# Patient Record
Sex: Male | Born: 1935 | Race: White | Hispanic: No | Marital: Married | Smoking: Former smoker
Health system: Southern US, Community
[De-identification: ages and names within clinical notes are randomized; demographics above are authoritative.]

## PROBLEM LIST (undated history)

## (undated) DIAGNOSIS — T17908A Unspecified foreign body in respiratory tract, part unspecified causing other injury, initial encounter: Secondary | ICD-10-CM

## (undated) DIAGNOSIS — F329 Major depressive disorder, single episode, unspecified: Secondary | ICD-10-CM

## (undated) DIAGNOSIS — R0902 Hypoxemia: Secondary | ICD-10-CM

## (undated) DIAGNOSIS — E7800 Pure hypercholesterolemia, unspecified: Secondary | ICD-10-CM

## (undated) DIAGNOSIS — R269 Unspecified abnormalities of gait and mobility: Secondary | ICD-10-CM

## (undated) DIAGNOSIS — R413 Other amnesia: Secondary | ICD-10-CM

## (undated) DIAGNOSIS — J449 Chronic obstructive pulmonary disease, unspecified: Secondary | ICD-10-CM

## (undated) DIAGNOSIS — J45909 Unspecified asthma, uncomplicated: Secondary | ICD-10-CM

## (undated) DIAGNOSIS — E538 Deficiency of other specified B group vitamins: Secondary | ICD-10-CM

## (undated) DIAGNOSIS — M810 Age-related osteoporosis without current pathological fracture: Secondary | ICD-10-CM

## (undated) DIAGNOSIS — H4901 Third [oculomotor] nerve palsy, right eye: Secondary | ICD-10-CM

## (undated) DIAGNOSIS — I219 Acute myocardial infarction, unspecified: Secondary | ICD-10-CM

## (undated) DIAGNOSIS — K449 Diaphragmatic hernia without obstruction or gangrene: Secondary | ICD-10-CM

## (undated) DIAGNOSIS — H469 Unspecified optic neuritis: Secondary | ICD-10-CM

## (undated) DIAGNOSIS — N200 Calculus of kidney: Secondary | ICD-10-CM

## (undated) DIAGNOSIS — K219 Gastro-esophageal reflux disease without esophagitis: Secondary | ICD-10-CM

## (undated) DIAGNOSIS — I639 Cerebral infarction, unspecified: Secondary | ICD-10-CM

## (undated) DIAGNOSIS — H3530 Unspecified macular degeneration: Secondary | ICD-10-CM

## (undated) DIAGNOSIS — IMO0002 Reserved for concepts with insufficient information to code with codable children: Secondary | ICD-10-CM

## (undated) DIAGNOSIS — G603 Idiopathic progressive neuropathy: Secondary | ICD-10-CM

## (undated) DIAGNOSIS — T18128A Food in esophagus causing other injury, initial encounter: Secondary | ICD-10-CM

## (undated) DIAGNOSIS — H269 Unspecified cataract: Secondary | ICD-10-CM

## (undated) DIAGNOSIS — I10 Essential (primary) hypertension: Secondary | ICD-10-CM

## (undated) DIAGNOSIS — F32A Depression, unspecified: Secondary | ICD-10-CM

## (undated) DIAGNOSIS — K5790 Diverticulosis of intestine, part unspecified, without perforation or abscess without bleeding: Secondary | ICD-10-CM

## (undated) HISTORY — DX: Unspecified abnormalities of gait and mobility: R26.9

## (undated) HISTORY — DX: Hypoxemia: R09.02

## (undated) HISTORY — DX: Food in esophagus causing other injury, initial encounter: T18.128A

## (undated) HISTORY — PX: OTHER SURGICAL HISTORY: SHX169

## (undated) HISTORY — DX: Pure hypercholesterolemia, unspecified: E78.00

## (undated) HISTORY — PX: LITHOTRIPSY: SUR834

## (undated) HISTORY — DX: Depression, unspecified: F32.A

## (undated) HISTORY — DX: Cerebral infarction, unspecified: I63.9

## (undated) HISTORY — DX: Unspecified macular degeneration: H35.30

## (undated) HISTORY — DX: Third (oculomotor) nerve palsy, right eye: H49.01

## (undated) HISTORY — DX: Major depressive disorder, single episode, unspecified: F32.9

## (undated) HISTORY — DX: Deficiency of other specified B group vitamins: E53.8

## (undated) HISTORY — DX: Idiopathic progressive neuropathy: G60.3

## (undated) HISTORY — DX: Age-related osteoporosis without current pathological fracture: M81.0

## (undated) HISTORY — DX: Unspecified asthma, uncomplicated: J45.909

## (undated) HISTORY — DX: Unspecified foreign body in respiratory tract, part unspecified causing other injury, initial encounter: T17.908A

## (undated) HISTORY — DX: Diaphragmatic hernia without obstruction or gangrene: K44.9

## (undated) HISTORY — DX: Unspecified optic neuritis: H46.9

## (undated) HISTORY — DX: Other amnesia: R41.3

---

## 1962-02-17 HISTORY — PX: SALIVARY STONE REMOVAL: SHX5213

## 1997-02-17 HISTORY — PX: ROTATOR CUFF REPAIR: SHX139

## 2000-02-18 DIAGNOSIS — I219 Acute myocardial infarction, unspecified: Secondary | ICD-10-CM

## 2000-02-18 HISTORY — PX: CARDIAC CATHETERIZATION: SHX172

## 2000-02-18 HISTORY — DX: Acute myocardial infarction, unspecified: I21.9

## 2006-02-17 HISTORY — PX: HIP FRACTURE SURGERY: SHX118

## 2006-02-17 HISTORY — PX: TOTAL HIP ARTHROPLASTY: SHX124

## 2010-02-17 HISTORY — PX: OTHER SURGICAL HISTORY: SHX169

## 2012-03-11 ENCOUNTER — Other Ambulatory Visit: Payer: Self-pay | Admitting: Neurosurgery

## 2012-03-11 ENCOUNTER — Encounter (HOSPITAL_COMMUNITY): Payer: Self-pay | Admitting: Pharmacy Technician

## 2012-03-12 ENCOUNTER — Encounter (HOSPITAL_COMMUNITY): Payer: Self-pay | Admitting: *Deleted

## 2012-03-14 MED ORDER — CEFAZOLIN SODIUM 1-5 GM-% IV SOLN
1.0000 g | INTRAVENOUS | Status: DC
  Filled 2012-03-14: qty 50

## 2012-03-15 ENCOUNTER — Ambulatory Visit (HOSPITAL_COMMUNITY): Payer: Medicare Other

## 2012-03-15 ENCOUNTER — Encounter (HOSPITAL_COMMUNITY): Payer: Self-pay | Admitting: Certified Registered"

## 2012-03-15 ENCOUNTER — Encounter (HOSPITAL_COMMUNITY)
Admission: RE | Disposition: A | Discharge status: Home / Self Care | Payer: Self-pay | Source: Ambulatory Visit | Attending: Neurosurgery

## 2012-03-15 ENCOUNTER — Encounter (HOSPITAL_COMMUNITY): Payer: Self-pay | Admitting: *Deleted

## 2012-03-15 ENCOUNTER — Ambulatory Visit (HOSPITAL_COMMUNITY)
Admission: RE | Admit: 2012-03-15 | Discharge: 2012-03-16 | Disposition: A | Discharge status: Home / Self Care | Payer: Medicare Other | Source: Ambulatory Visit | Attending: Neurosurgery | Admitting: Neurosurgery

## 2012-03-15 ENCOUNTER — Ambulatory Visit (HOSPITAL_COMMUNITY): Payer: Medicare Other | Admitting: Certified Registered"

## 2012-03-15 DIAGNOSIS — I2510 Atherosclerotic heart disease of native coronary artery without angina pectoris: Secondary | ICD-10-CM | POA: Insufficient documentation

## 2012-03-15 DIAGNOSIS — J4489 Other specified chronic obstructive pulmonary disease: Secondary | ICD-10-CM | POA: Insufficient documentation

## 2012-03-15 DIAGNOSIS — I252 Old myocardial infarction: Secondary | ICD-10-CM | POA: Insufficient documentation

## 2012-03-15 DIAGNOSIS — I10 Essential (primary) hypertension: Secondary | ICD-10-CM | POA: Insufficient documentation

## 2012-03-15 DIAGNOSIS — J449 Chronic obstructive pulmonary disease, unspecified: Secondary | ICD-10-CM | POA: Insufficient documentation

## 2012-03-15 DIAGNOSIS — M5126 Other intervertebral disc displacement, lumbar region: Secondary | ICD-10-CM | POA: Insufficient documentation

## 2012-03-15 DIAGNOSIS — K219 Gastro-esophageal reflux disease without esophagitis: Secondary | ICD-10-CM | POA: Insufficient documentation

## 2012-03-15 HISTORY — PX: LUMBAR LAMINECTOMY/DECOMPRESSION MICRODISCECTOMY: SHX5026

## 2012-03-15 HISTORY — DX: Calculus of kidney: N20.0

## 2012-03-15 HISTORY — DX: Essential (primary) hypertension: I10

## 2012-03-15 HISTORY — DX: Acute myocardial infarction, unspecified: I21.9

## 2012-03-15 HISTORY — DX: Gastro-esophageal reflux disease without esophagitis: K21.9

## 2012-03-15 HISTORY — DX: Reserved for concepts with insufficient information to code with codable children: IMO0002

## 2012-03-15 HISTORY — DX: Diverticulosis of intestine, part unspecified, without perforation or abscess without bleeding: K57.90

## 2012-03-15 HISTORY — DX: Chronic obstructive pulmonary disease, unspecified: J44.9

## 2012-03-15 LAB — BASIC METABOLIC PANEL
BUN: 27 mg/dL — ABNORMAL HIGH (ref 6–23)
Chloride: 99 mEq/L (ref 96–112)
Creatinine, Ser: 0.97 mg/dL (ref 0.50–1.35)
GFR calc Af Amer: >90 mL/min (ref >90)
GFR calc non Af Amer: 79 mL/min — ABNORMAL LOW (ref >90)
Glucose, Bld: 100 mg/dL — ABNORMAL HIGH (ref 70–99)
Potassium: 5 mEq/L (ref 3.5–5.1)

## 2012-03-15 LAB — CBC
HCT: 43.7 % (ref 39.0–52.0)
Hemoglobin: 15.3 g/dL (ref 13.0–17.0)
MCH: 30 pg (ref 26.0–34.0)
MCHC: 35 g/dL (ref 30.0–36.0)
RDW: 13.1 % (ref 11.5–15.5)

## 2012-03-15 LAB — SURGICAL PCR SCREEN: MRSA, PCR: NEGATIVE

## 2012-03-15 SURGERY — LUMBAR LAMINECTOMY/DECOMPRESSION MICRODISCECTOMY 1 LEVEL
Anesthesia: General | Site: Back | Laterality: Left | Wound class: Clean

## 2012-03-15 MED ORDER — PROPOFOL 10 MG/ML IV BOLUS
INTRAVENOUS | Status: DC | PRN
  Administered 2012-03-15: 150 mg via INTRAVENOUS

## 2012-03-15 MED ORDER — SODIUM CHLORIDE 0.9 % IJ SOLN
3.0000 mL | Freq: Two times a day (BID) | INTRAMUSCULAR | Status: DC
  Administered 2012-03-15: 3 mL via INTRAVENOUS

## 2012-03-15 MED ORDER — HYDROMORPHONE HCL PF 1 MG/ML IJ SOLN: 0.2500 mg | INTRAMUSCULAR | Status: DC | PRN

## 2012-03-15 MED ORDER — HYDROMORPHONE HCL PF 1 MG/ML IJ SOLN
0.5000 mg | INTRAMUSCULAR | Status: DC | PRN
Start: 2012-03-15 — End: 2012-03-16

## 2012-03-15 MED ORDER — PHENOL 1.4 % MT LIQD: 1.0000 | OROMUCOSAL | Status: DC | PRN

## 2012-03-15 MED ORDER — BUPIVACAINE HCL (PF) 0.25 % IJ SOLN
INTRAMUSCULAR | Status: DC | PRN
  Administered 2012-03-15: 10 mL

## 2012-03-15 MED ORDER — HYDROCODONE-ACETAMINOPHEN 5-325 MG PO TABS: 1.0000 | ORAL_TABLET | ORAL | Status: DC | PRN

## 2012-03-15 MED ORDER — BACITRACIN 50000 UNITS IM SOLR
INTRAMUSCULAR | Status: AC
  Filled 2012-03-15: qty 1

## 2012-03-15 MED ORDER — GUAIFENESIN ER 600 MG PO TB12
600.0000 mg | ORAL_TABLET | Freq: Two times a day (BID) | ORAL | Status: DC
  Administered 2012-03-15: 600 mg via ORAL
  Filled 2012-03-15 (×3): qty 1

## 2012-03-15 MED ORDER — CEFAZOLIN SODIUM-DEXTROSE 2-3 GM-% IV SOLR
INTRAVENOUS | Status: AC
  Filled 2012-03-15: qty 50

## 2012-03-15 MED ORDER — OXYCODONE HCL 5 MG PO TABS: 5.0000 mg | ORAL_TABLET | ORAL | Status: DC | PRN

## 2012-03-15 MED ORDER — ROCURONIUM BROMIDE 100 MG/10ML IV SOLN
INTRAVENOUS | Status: DC | PRN
  Administered 2012-03-15: 50 mg via INTRAVENOUS

## 2012-03-15 MED ORDER — SODIUM CHLORIDE 0.9 % IV SOLN
INTRAVENOUS | Status: AC
  Filled 2012-03-15: qty 500

## 2012-03-15 MED ORDER — ONDANSETRON HCL 4 MG/2ML IJ SOLN
INTRAMUSCULAR | Status: DC | PRN
  Administered 2012-03-15: 4 mg via INTRAVENOUS

## 2012-03-15 MED ORDER — LIDOCAINE-EPINEPHRINE 1 %-1:100000 IJ SOLN
INTRAMUSCULAR | Status: DC | PRN
  Administered 2012-03-15: 9 mL

## 2012-03-15 MED ORDER — CEFAZOLIN SODIUM-DEXTROSE 2-3 GM-% IV SOLR
2.0000 g | INTRAVENOUS | Status: AC
  Administered 2012-03-15: 2 g via INTRAVENOUS

## 2012-03-15 MED ORDER — ALENDRONATE SODIUM 70 MG PO TABS: 70.0000 mg | ORAL_TABLET | ORAL | Status: DC

## 2012-03-15 MED ORDER — ACETAMINOPHEN 325 MG PO TABS: 650.0000 mg | ORAL_TABLET | ORAL | Status: DC | PRN

## 2012-03-15 MED ORDER — HEMOSTATIC AGENTS (NO CHARGE) OPTIME
TOPICAL | Status: DC | PRN
  Administered 2012-03-15: 1 via TOPICAL

## 2012-03-15 MED ORDER — NIACIN ER (ANTIHYPERLIPIDEMIC) 500 MG PO TBCR
500.0000 mg | EXTENDED_RELEASE_TABLET | Freq: Every day | ORAL | Status: DC
  Administered 2012-03-15: 500 mg via ORAL
  Filled 2012-03-15 (×2): qty 1

## 2012-03-15 MED ORDER — DOCUSATE SODIUM 100 MG PO CAPS
100.0000 mg | ORAL_CAPSULE | Freq: Two times a day (BID) | ORAL | Status: DC
  Administered 2012-03-15 (×2): 100 mg via ORAL
  Filled 2012-03-15 (×2): qty 1

## 2012-03-15 MED ORDER — DEXAMETHASONE SODIUM PHOSPHATE 10 MG/ML IJ SOLN: 10.0000 mg | INTRAMUSCULAR | Status: DC

## 2012-03-15 MED ORDER — DEXAMETHASONE SODIUM PHOSPHATE 10 MG/ML IJ SOLN
INTRAMUSCULAR | Status: AC
  Administered 2012-03-15: 10 mg via INTRAVENOUS
  Filled 2012-03-15: qty 1

## 2012-03-15 MED ORDER — EZETIMIBE-SIMVASTATIN 10-40 MG PO TABS
1.0000 | ORAL_TABLET | Freq: Every day | ORAL | Status: DC
  Administered 2012-03-15: 1 via ORAL
  Filled 2012-03-15 (×2): qty 1

## 2012-03-15 MED ORDER — MUPIROCIN 2 % EX OINT
TOPICAL_OINTMENT | CUTANEOUS | Status: AC
  Filled 2012-03-15: qty 22

## 2012-03-15 MED ORDER — PANTOPRAZOLE SODIUM 40 MG PO TBEC: 40.0000 mg | DELAYED_RELEASE_TABLET | Freq: Every day | ORAL | Status: DC

## 2012-03-15 MED ORDER — LACTATED RINGERS IV SOLN
INTRAVENOUS | Status: DC | PRN
  Administered 2012-03-15 (×2): via INTRAVENOUS

## 2012-03-15 MED ORDER — CEFAZOLIN SODIUM 1-5 GM-% IV SOLN
1.0000 g | Freq: Three times a day (TID) | INTRAVENOUS | Status: AC
Start: 2012-03-15 — End: 2012-03-16
  Administered 2012-03-15 – 2012-03-16 (×2): 1 g via INTRAVENOUS
  Filled 2012-03-15 (×2): qty 50

## 2012-03-15 MED ORDER — LIDOCAINE HCL (CARDIAC) 20 MG/ML IV SOLN
INTRAVENOUS | Status: DC | PRN
  Administered 2012-03-15: 30 mg via INTRAVENOUS
  Administered 2012-03-15: 70 mg via INTRAVENOUS

## 2012-03-15 MED ORDER — OXYCODONE-ACETAMINOPHEN 10-325 MG PO TABS: 1.0000 | ORAL_TABLET | ORAL | Status: DC | PRN

## 2012-03-15 MED ORDER — 0.9 % SODIUM CHLORIDE (POUR BTL) OPTIME
TOPICAL | Status: DC | PRN
  Administered 2012-03-15: 1000 mL

## 2012-03-15 MED ORDER — OXYCODONE HCL 5 MG/5ML PO SOLN: 5.0000 mg | Freq: Once | ORAL | Status: DC | PRN

## 2012-03-15 MED ORDER — ONDANSETRON HCL 4 MG/2ML IJ SOLN: 4.0000 mg | INTRAMUSCULAR | Status: DC | PRN

## 2012-03-15 MED ORDER — LIDOCAINE HCL (CARDIAC) 20 MG/ML IV SOLN: INTRAVENOUS | Status: DC | PRN

## 2012-03-15 MED ORDER — THROMBIN 5000 UNITS EX KIT
PACK | CUTANEOUS | Status: DC | PRN
  Administered 2012-03-15 (×2): 5000 [IU] via TOPICAL

## 2012-03-15 MED ORDER — ACETAMINOPHEN 10 MG/ML IV SOLN
INTRAVENOUS | Status: AC
  Administered 2012-03-15: 1000 mg via INTRAVENOUS
  Filled 2012-03-15: qty 100

## 2012-03-15 MED ORDER — SODIUM CHLORIDE 0.9 % IV SOLN: 250.0000 mL | INTRAVENOUS | Status: DC

## 2012-03-15 MED ORDER — ARTIFICIAL TEARS OP OINT
TOPICAL_OINTMENT | OPHTHALMIC | Status: DC | PRN
  Administered 2012-03-15: 1 via OPHTHALMIC

## 2012-03-15 MED ORDER — OXYCODONE-ACETAMINOPHEN 5-325 MG PO TABS
1.0000 | ORAL_TABLET | ORAL | Status: DC | PRN
  Administered 2012-03-15: 1 via ORAL
  Administered 2012-03-16: 2 via ORAL
  Filled 2012-03-15: qty 1
  Filled 2012-03-15: qty 2

## 2012-03-15 MED ORDER — METOPROLOL TARTRATE 25 MG PO TABS
25.0000 mg | ORAL_TABLET | Freq: Two times a day (BID) | ORAL | Status: DC
  Filled 2012-03-15 (×3): qty 1

## 2012-03-15 MED ORDER — FENTANYL CITRATE 0.05 MG/ML IJ SOLN
INTRAMUSCULAR | Status: DC | PRN
  Administered 2012-03-15: 100 ug via INTRAVENOUS

## 2012-03-15 MED ORDER — CYCLOBENZAPRINE HCL 10 MG PO TABS
10.0000 mg | ORAL_TABLET | Freq: Three times a day (TID) | ORAL | Status: DC | PRN
Start: 2012-03-15 — End: 2012-03-16
  Administered 2012-03-15: 10 mg via ORAL
  Filled 2012-03-15: qty 1

## 2012-03-15 MED ORDER — MUPIROCIN 2 % EX OINT
TOPICAL_OINTMENT | Freq: Two times a day (BID) | CUTANEOUS | Status: DC
  Administered 2012-03-15: 22:00:00 via NASAL
  Filled 2012-03-15: qty 22

## 2012-03-15 MED ORDER — SODIUM CHLORIDE 0.9 % IJ SOLN: 3.0000 mL | INTRAMUSCULAR | Status: DC | PRN

## 2012-03-15 MED ORDER — VITAMIN D (ERGOCALCIFEROL) 1.25 MG (50000 UNIT) PO CAPS: 50000.0000 [IU] | ORAL_CAPSULE | ORAL | Status: DC

## 2012-03-15 MED ORDER — MENTHOL 3 MG MT LOZG: 1.0000 | LOZENGE | OROMUCOSAL | Status: DC | PRN

## 2012-03-15 MED ORDER — GLYCOPYRROLATE 0.2 MG/ML IJ SOLN
INTRAMUSCULAR | Status: DC | PRN
  Administered 2012-03-15: .6 mg via INTRAVENOUS

## 2012-03-15 MED ORDER — SODIUM CHLORIDE 0.9 % IR SOLN
Status: DC | PRN
  Administered 2012-03-15: 11:00:00

## 2012-03-15 MED ORDER — OXYCODONE-ACETAMINOPHEN 5-325 MG PO TABS: 1.0000 | ORAL_TABLET | ORAL | Status: DC | PRN

## 2012-03-15 MED ORDER — NEOSTIGMINE METHYLSULFATE 1 MG/ML IJ SOLN
INTRAMUSCULAR | Status: DC | PRN
  Administered 2012-03-15: 4 mg via INTRAVENOUS

## 2012-03-15 MED ORDER — PROPOFOL 10 MG/ML IV BOLUS: INTRAVENOUS | Status: DC | PRN

## 2012-03-15 MED ORDER — ONDANSETRON HCL 4 MG/2ML IJ SOLN: 4.0000 mg | Freq: Four times a day (QID) | INTRAMUSCULAR | Status: DC | PRN

## 2012-03-15 MED ORDER — ACETAMINOPHEN 650 MG RE SUPP: 650.0000 mg | RECTAL | Status: DC | PRN

## 2012-03-15 MED ORDER — RAMIPRIL 2.5 MG PO CAPS
2.5000 mg | ORAL_CAPSULE | Freq: Every day | ORAL | Status: DC
  Administered 2012-03-15: 2.5 mg via ORAL
  Filled 2012-03-15 (×2): qty 1

## 2012-03-15 MED ORDER — OXYCODONE HCL 5 MG PO TABS: 5.0000 mg | ORAL_TABLET | Freq: Once | ORAL | Status: DC | PRN

## 2012-03-15 SURGICAL SUPPLY — 56 items
BAG DECANTER FOR FLEXI CONT (MISCELLANEOUS) ×2 IMPLANT
BENZOIN TINCTURE PRP APPL 2/3 (GAUZE/BANDAGES/DRESSINGS) ×2 IMPLANT
BLADE SURG 11 STRL SS (BLADE) ×2 IMPLANT
BLADE SURG ROTATE 9660 (MISCELLANEOUS) IMPLANT
BRUSH SCRUB EZ PLAIN DRY (MISCELLANEOUS) ×2 IMPLANT
BUR MATCHSTICK NEURO 3.0 LAGG (BURR) ×2 IMPLANT
BUR PRECISION FLUTE 6.0 (BURR) ×2 IMPLANT
CANISTER SUCTION 2500CC (MISCELLANEOUS) ×2 IMPLANT
CLOTH BEACON ORANGE TIMEOUT ST (SAFETY) ×2 IMPLANT
CONT SPEC 4OZ CLIKSEAL STRL BL (MISCELLANEOUS) ×2 IMPLANT
DECANTER SPIKE VIAL GLASS SM (MISCELLANEOUS) ×2 IMPLANT
DERMABOND ADVANCED (GAUZE/BANDAGES/DRESSINGS) ×1
DERMABOND ADVANCED .7 DNX12 (GAUZE/BANDAGES/DRESSINGS) ×1 IMPLANT
DRAPE LAPAROTOMY 100X72X124 (DRAPES) ×2 IMPLANT
DRAPE MICROSCOPE LEICA (MISCELLANEOUS) ×2 IMPLANT
DRAPE POUCH INSTRU U-SHP 10X18 (DRAPES) ×2 IMPLANT
DRAPE PROXIMA HALF (DRAPES) IMPLANT
DRAPE SURG 17X23 STRL (DRAPES) ×2 IMPLANT
DRSG OPSITE 4X5.5 SM (GAUZE/BANDAGES/DRESSINGS) ×2 IMPLANT
DURAPREP 26ML APPLICATOR (WOUND CARE) ×2 IMPLANT
ELECT REM PT RETURN 9FT ADLT (ELECTROSURGICAL) ×2
ELECTRODE REM PT RTRN 9FT ADLT (ELECTROSURGICAL) ×1 IMPLANT
GAUZE SPONGE 4X4 16PLY XRAY LF (GAUZE/BANDAGES/DRESSINGS) IMPLANT
GLOVE BIO SURGEON STRL SZ8 (GLOVE) ×2 IMPLANT
GLOVE BIOGEL PI IND STRL 7.5 (GLOVE) ×1 IMPLANT
GLOVE BIOGEL PI IND STRL 8.5 (GLOVE) ×2 IMPLANT
GLOVE BIOGEL PI INDICATOR 7.5 (GLOVE) ×1
GLOVE BIOGEL PI INDICATOR 8.5 (GLOVE) ×2
GLOVE ECLIPSE 7.5 STRL STRAW (GLOVE) ×4 IMPLANT
GLOVE EXAM NITRILE LRG STRL (GLOVE) IMPLANT
GLOVE EXAM NITRILE MD LF STRL (GLOVE) ×2 IMPLANT
GLOVE EXAM NITRILE XL STR (GLOVE) IMPLANT
GLOVE EXAM NITRILE XS STR PU (GLOVE) IMPLANT
GLOVE INDICATOR 8.5 STRL (GLOVE) ×2 IMPLANT
GLOVE SURG SS PI 8.0 STRL IVOR (GLOVE) ×6 IMPLANT
GOWN BRE IMP SLV AUR LG STRL (GOWN DISPOSABLE) IMPLANT
GOWN BRE IMP SLV AUR XL STRL (GOWN DISPOSABLE) ×4 IMPLANT
GOWN STRL REIN 2XL LVL4 (GOWN DISPOSABLE) ×4 IMPLANT
KIT BASIN OR (CUSTOM PROCEDURE TRAY) ×2 IMPLANT
KIT ROOM TURNOVER OR (KITS) ×2 IMPLANT
NEEDLE HYPO 22GX1.5 SAFETY (NEEDLE) ×2 IMPLANT
NEEDLE SPNL 22GX3.5 QUINCKE BK (NEEDLE) ×2 IMPLANT
NS IRRIG 1000ML POUR BTL (IV SOLUTION) ×2 IMPLANT
PACK LAMINECTOMY NEURO (CUSTOM PROCEDURE TRAY) ×2 IMPLANT
RUBBERBAND STERILE (MISCELLANEOUS) ×4 IMPLANT
SPONGE GAUZE 4X4 12PLY (GAUZE/BANDAGES/DRESSINGS) ×2 IMPLANT
SPONGE SURGIFOAM ABS GEL SZ50 (HEMOSTASIS) ×2 IMPLANT
STRIP CLOSURE SKIN 1/2X4 (GAUZE/BANDAGES/DRESSINGS) ×2 IMPLANT
SUT VIC AB 0 CT1 18XCR BRD8 (SUTURE) ×1 IMPLANT
SUT VIC AB 0 CT1 8-18 (SUTURE) ×1
SUT VIC AB 2-0 CT1 18 (SUTURE) ×2 IMPLANT
SUT VICRYL 4-0 PS2 18IN ABS (SUTURE) ×2 IMPLANT
SYR 20ML ECCENTRIC (SYRINGE) ×2 IMPLANT
TOWEL OR 17X24 6PK STRL BLUE (TOWEL DISPOSABLE) ×2 IMPLANT
TOWEL OR 17X26 10 PK STRL BLUE (TOWEL DISPOSABLE) ×2 IMPLANT
WATER STERILE IRR 1000ML POUR (IV SOLUTION) ×2 IMPLANT

## 2012-03-15 NOTE — Anesthesia Procedure Notes (Signed)
Procedure Name: Intubation Date/Time: 03/15/2012 10:53 AM Performed by: Fransisca Kaufmann Pre-anesthesia Checklist: Patient identified, Suction available, Patient being monitored and Timeout performed Patient Re-evaluated:Patient Re-evaluated prior to inductionOxygen Delivery Method: Circle system utilized Preoxygenation: Pre-oxygenation with 100% oxygen Intubation Type: IV induction Ventilation: Mask ventilation without difficulty Laryngoscope Size: Miller and 3 Grade View: Grade I Tube type: Oral Tube size: 8.0 mm Number of attempts: 1 Airway Equipment and Method: Stylet Placement Confirmation: ETT inserted through vocal cords under direct vision,  positive ETCO2 and breath sounds checked- equal and bilateral Secured at: 23 cm Tube secured with: Tape Dental Injury: Teeth and Oropharynx as per pre-operative assessment

## 2012-03-15 NOTE — Op Note (Signed)
Preoperative diagnosis: Bilateral L3-L4 radiculopathy from large ruptured disc L3-4  Postoperative diagnosis: Same  Procedure: lumbar laminectomy  and microscopic discectomy L3-4 with microdissection of the left L4 nerve root  Surgeon: Donalee Citrin  Assistant: Sherilyn Cooter pool  Anesthesia: Gen.  EBL: Minimal  History of present illness: Patient positive for presents with worsening bilateral hip and leg pain rating down to his anterior quad as well as lateral thighs and from the standard numbness tingling syncope she difficulty walking. Workup revealed a very large disc herniation causing severe stenosis at L3-4 displacing both L4 nerve roots with the majority of the disc being eccentric to the left. The patient's progression of clinical syndrome and imaging findings and failure conservative treatment I recommended a laminectomy microdiscectomy recommend initial approach from the left I went over the risks and benefits of the operation with him as well as perioperative course expectations about alternatives of surgery he understood and agreed to proceed forward.  Operative procedure: Patient was brought into the or was induced under general anesthesia positioned prone the Wilson frame his back was prepped and draped in routine sterile fashion. Preoperative localizing the L4 pedicle so incision was centered just slightly superior this after infiltration of 9 cc lidocaine with epi and Bovie light car was used to gas in tissues subperiosteal dissections care lamina of L. L3 and L4 and interoperative x-ray confirmed this to the case. Then using a high-speed drill the interest of the L3 medial facet complexes suppressant of 4 drill down laminotomy was begun with a 3 minute Kerrison punch ligament flavum was identified and removed in piecemeal fashion exposing the thecal sac and the microscope illumination under biting of the medial facet complex a ladder axis the lateral margins disc space the L4 pedicle was  identified and the L4 nerve was identified at the level of the pedicle. Then working up underneath the L4 nerve root this is dissected off of very large free fragment of disc which was partially incised with lumbar scalpel and several large fragments of disc were medially removed with I nerve hook and pituitary rongeurs. The spaces cleanout radically several more fragments removed from just superior disc space underneath the interest of the L3 nerve root then at the discectomy there is no further stenosis coronary.and hockey-stick now is easily accepted out each foramen as well as across the midline cephalad caudally. Was in to proceed irrigated meticulous hemostasis was maintained Gelfoam was overlaid top of the dura and the muscle fascia reapproximated layers with after Vicryl and the skin was closed with a running 4 subcuticular benzo and Steri-Strips were applied patient recovered in stable condition. At the end of case on it counts and sponge counts were correct.

## 2012-03-15 NOTE — Transfer of Care (Signed)
Immediate Anesthesia Transfer of Care Note  Patient: Chris Braun  Procedure(s) Performed: Procedure(s) (LRB) with comments: LUMBAR LAMINECTOMY/DECOMPRESSION MICRODISCECTOMY 1 LEVEL (Left) - Left lumbar three-four decompressive lumbar laminectomy, discectomy  Patient Location: PACU  Anesthesia Type:General  Level of Consciousness: awake, alert , oriented and sedated  Airway & Oxygen Therapy: Patient Spontanous Breathing and Patient connected to nasal cannula oxygen  Post-op Assessment: Report given to PACU RN, Post -op Vital signs reviewed and stable and Patient moving all extremities  Post vital signs: Reviewed and stable  Complications: No apparent anesthesia complications

## 2012-03-15 NOTE — H&P (Signed)
Chris Braun is an 77 y.o. male.   Chief Complaint: Back and bilateral hip and leg pain HPI: Patient is a 64 of them is a long-standing history with his back he is an old compression fracture is in managing with chronic pain for a long time however last couple weeks have progressive worsening back and pain in both legs C7 aggressive decline his bili ambulate I initially to a cane now with a walker pain he says reports into his hips and 9 to the front of his quads as well as the outside of his thighs and understands is been refractory to all forms of conservative treatment he underwent followup MRI scan showed a new disc herniation and due to his failure conservative treatment progression of clinical syndrome and imaging findings we recommended laminectomy and discectomy.  Past Medical History  Diagnosis Date  . Myocardial infarction 2002  . Compression fracture     BAck  . Kidney stones   . Diverticulosis     per colonscopy  . Hypertension   . COPD (chronic obstructive pulmonary disease)   . GERD (gastroesophageal reflux disease)     Past Surgical History  Procedure Date  . Vt study     with Ablation  . Hip fracture surgery 2008    right  . Rotator cuff repair     bil   . Lithotripsy   . Colonscopy   . Cardiac catheterization 2002,    stents     History reviewed. No pertinent family history. Social History:  does not have a smoking history on file. He does not have any smokeless tobacco history on file. His alcohol and drug histories not on file.  Allergies: No Known Allergies  Medications Prior to Admission  Medication Sig Dispense Refill  . alendronate (FOSAMAX) 70 MG tablet Take 70 mg by mouth every 7 (seven) days. Take with a full glass of water on an empty stomach. Takes on Friday.      . cyclobenzaprine (FLEXERIL) 10 MG tablet Take 10 mg by mouth 3 (three) times daily as needed.      . ezetimibe-simvastatin (VYTORIN) 10-40 MG per tablet Take 1 tablet by mouth  at bedtime.      . metoprolol tartrate (LOPRESSOR) 25 MG tablet Take 25 mg by mouth 2 (two) times daily.      . niacin (NIASPAN) 500 MG CR tablet Take 500 mg by mouth at bedtime.      Marland Kitchen omeprazole (PRILOSEC) 20 MG capsule Take 20 mg by mouth daily.      Marland Kitchen oxyCODONE-acetaminophen (PERCOCET) 10-325 MG per tablet Take 1 tablet by mouth every 4 (four) hours as needed.      . ramipril (ALTACE) 2.5 MG capsule Take 2.5 mg by mouth daily.      . Vitamin D, Ergocalciferol, (DRISDOL) 50000 UNITS CAPS Take 50,000 Units by mouth every 7 (seven) days. Takes on Friday        Results for orders placed during the hospital encounter of 03/15/12 (from the past 48 hour(s))  BASIC METABOLIC PANEL     Status: Abnormal   Collection Time   03/15/12  9:21 AM      Component Value Range Comment   Sodium 136  135 - 145 mEq/L    Potassium 5.0  3.5 - 5.1 mEq/L    Chloride 99  96 - 112 mEq/L    CO2 27  19 - 32 mEq/L    Glucose, Bld 100 (*) 70 - 99 mg/dL  BUN 27 (*) 6 - 23 mg/dL    Creatinine, Ser 1.61  0.50 - 1.35 mg/dL    Calcium 9.4  8.4 - 09.6 mg/dL    GFR calc non Af Amer 79 (*) >90 mL/min    GFR calc Af Amer >90  >90 mL/min   CBC     Status: Abnormal   Collection Time   03/15/12  9:21 AM      Component Value Range Comment   WBC 11.1 (*) 4.0 - 10.5 K/uL    RBC 5.10  4.22 - 5.81 MIL/uL    Hemoglobin 15.3  13.0 - 17.0 g/dL    HCT 04.5  40.9 - 81.1 %    MCV 85.7  78.0 - 100.0 fL    MCH 30.0  26.0 - 34.0 pg    MCHC 35.0  30.0 - 36.0 g/dL    RDW 91.4  78.2 - 95.6 %    Platelets 191  150 - 400 K/uL    Dg Chest 2 View  03/15/2012  *RADIOLOGY REPORT*  Clinical Data: Preoperative respiratory examination for lumbar spine surgery.  Cough and congestion.  CHEST - 2 VIEW  Comparison: Radiographs and CT 08/11/2006.  Findings: The heart size and mediastinal contours are stable. There is stable mild elevation of the left hemidiaphragm.  Overall basilar aeration has improved with resolution of previously demonstrated  bibasilar atelectasis.  Diffuse emphysematous changes are again noted.  There is no pleural effusion or pneumothorax.  IMPRESSION: Resolved bibasilar atelectasis with stable findings of chronic obstructive pulmonary disease.  No acute cardiopulmonary process.   Original Report Authenticated By: Carey Bullocks, M.D.     Review of Systems  Constitutional: Negative.   HENT: Negative.   Eyes: Negative.   Respiratory: Negative.   Cardiovascular: Negative.   Gastrointestinal: Negative.   Genitourinary: Negative.   Musculoskeletal: Positive for myalgias, back pain and joint pain.  Neurological: Positive for tingling and sensory change.  Endo/Heme/Allergies: Negative.   Psychiatric/Behavioral: Negative.     Blood pressure 122/76, pulse 51, temperature 97.8 F (36.6 C), temperature source Oral, resp. rate 16, height 5\' 8"  (1.727 m), weight 77.111 kg (170 lb), SpO2 97.00%. Physical Exam  Constitutional: He is oriented to person, place, and time. He appears well-developed.  HENT:  Head: Normocephalic.  Neck: Normal range of motion.  Cardiovascular: Normal rate.   Respiratory: Effort normal.  GI: Soft.  Musculoskeletal: Normal range of motion.  Neurological: He is alert and oriented to person, place, and time. He has normal strength. GCS eye subscore is 4. GCS verbal subscore is 5. GCS motor subscore is 6.  Reflex Scores:      Patellar reflexes are 0 on the right side and 0 on the left side.      Achilles reflexes are 0 on the right side and 0 on the left side.      Strength is 5 out of 5 in his iliopsoas, quads, hamstrings, gastrocs, anterior tibialis, and EHL.     Assessment/Plan 77 year old gentleman presents for L3-4 lumbar lengthening discectomy I have recommended a left-sided approach as a disc herniation shavers the left side however is a large herniation that is causing stenosis centrally and bilateral symptoms I explained the patient is possible and expose the right side and tied the  lamina laminotomy on that side as well he understands all this brings is reviewed the risks and benefits of the operation with him as well as perioperative course expectations about alternatives of surgery he wants to  proceed forward.  Lorice Lafave P 03/15/2012, 10:38 AM

## 2012-03-15 NOTE — Progress Notes (Signed)
PHARMACIST - PHYSICIAN COMMUNICATION  CONCERNING: P&T Medication Policy Regarding Oral Bisphosphonates  RECOMMENDATION: Your order for alendronate (Fosamax), ibandronate (Boniva), or risedronate (Actonel) has been discontinued at this time.  If the patient's post-hospital medical condition warrants safe use of this class of drugs, please resume the pre-hospital regimen upon discharge.  DESCRIPTION:  Alendronate (Fosamax), ibandronate (Boniva), and risedronate (Actonel) can cause severe esophageal erosions in patients who are unable to remain upright at least 30 minutes after taking this medication.   Since brief interruptions in therapy are thought to have minimal impact on bone mineral density, the Pharmacy & Therapeutics Committee has established that bisphosphonate orders should be routinely discontinued during hospitalization.   To override this safety policy and permit administration of Boniva, Fosamax, or Actonel in the hospital, prescribers must write "DO NOT HOLD" in the comments section when placing the order for this class of medications.   Noah Delaine, RPh Clinical Pharmacist Pager: [phone] 03/15/2012 2:12 PM

## 2012-03-15 NOTE — Progress Notes (Addendum)
0840   HAVE REQUESTED AGAIN (ORIGINAL SENT Friday 24th AT 8:06PM) CARDIAC NOTES (STRESS, EKG, ECHO, LAST OFFICE NOTE) TO BE FAXED TO SHORT STAY A.Marland KitchenMarland KitchenCARDIO OFFICE WAS UNAWARE THAT Chris Braun WAS HAVING BACK SURGERY, SO I DOUBT WE WILL GET A CARDIAC CLEARANCE NOTE...DA  (HIS CARDIO MD IS DR. Bing Matter...)

## 2012-03-15 NOTE — Anesthesia Preprocedure Evaluation (Signed)
Anesthesia Evaluation  Patient identified by MRN, date of birth, ID band Patient awake    Reviewed: Allergy & Precautions, H&P , NPO status , Patient's Chart, lab work & pertinent test results  Airway Mallampati: II  Neck ROM: full    Dental   Pulmonary COPD         Cardiovascular hypertension, + CAD and + Past MI     Neuro/Psych    GI/Hepatic GERD-  ,  Endo/Other    Renal/GU      Musculoskeletal   Abdominal   Peds  Hematology   Anesthesia Other Findings   Reproductive/Obstetrics                           Anesthesia Physical Anesthesia Plan  ASA: III  Anesthesia Plan: General   Post-op Pain Management:    Induction: Intravenous  Airway Management Planned: Oral ETT  Additional Equipment:   Intra-op Plan:   Post-operative Plan: Extubation in OR  Informed Consent: I have reviewed the patients History and Physical, chart, labs and discussed the procedure including the risks, benefits and alternatives for the proposed anesthesia with the patient or authorized representative who has indicated his/her understanding and acceptance.     Plan Discussed with: CRNA and Surgeon  Anesthesia Plan Comments:         Anesthesia Quick Evaluation

## 2012-03-15 NOTE — Preoperative (Signed)
Beta Blockers   Reason not to administer Beta Blockers:Not Applicable 

## 2012-03-15 NOTE — Anesthesia Postprocedure Evaluation (Signed)
Anesthesia Post Note  Patient: Chris Braun  Procedure(s) Performed: Procedure(s) (LRB): LUMBAR LAMINECTOMY/DECOMPRESSION MICRODISCECTOMY 1 LEVEL (Left)  Anesthesia type: General  Patient location: PACU  Post pain: Pain level controlled and Adequate analgesia  Post assessment: Post-op Vital signs reviewed, Patient's Cardiovascular Status Stable, Respiratory Function Stable, Patent Airway and Pain level controlled  Last Vitals:  Filed Vitals:   03/15/12 1350  BP: 141/89  Pulse: 65  Temp: 36.6 C  Resp: 18    Post vital signs: Reviewed and stable  Level of consciousness: awake, alert  and oriented  Complications: No apparent anesthesia complications

## 2012-03-16 ENCOUNTER — Encounter (HOSPITAL_COMMUNITY): Payer: Self-pay | Admitting: Neurosurgery

## 2012-03-16 MED ORDER — OXYCODONE-ACETAMINOPHEN 5-325 MG PO TABS: 1.0000 | ORAL_TABLET | ORAL | Status: DC | PRN

## 2012-03-16 MED ORDER — CYCLOBENZAPRINE HCL 10 MG PO TABS: 10.0000 mg | ORAL_TABLET | Freq: Three times a day (TID) | ORAL | Status: DC | PRN

## 2012-03-16 NOTE — Progress Notes (Signed)
Pt and wife given D/C instructions with Rx's, both verbalized understanding. Pt D/C'd home via wheelchair @ 0750 per MD order. Rema Fendt, RN

## 2012-03-16 NOTE — Discharge Summary (Signed)
  Physician Discharge Summary  Patient ID: Chris Braun MRN: [id number] DOB/AGE: December 17, 1935 77 y.o.  Admit date: 03/15/2012 Discharge date: 03/16/2012  Admission Diagnoses: HNP L3-4  Discharge Diagnoses: Same Active Problems:  * No active hospital problems. *    Discharged Condition: good  Hospital Course: Patient hospital underwent laminectomy discectomy L3-4 and left postoperative patient did very well recovered in the floor on the floor patient was convalescing well and living and voiding spontaneously tolerating regular diet was stable and be discharged home on postop day 1.  Consults: Significant Diagnostic Studies: Treatments: L3-4 left-sided laminectomy discectomy Discharge Exam: Blood pressure 123/67, pulse 72, temperature 97.8 F (36.6 C), temperature source Oral, resp. rate 18, height 5\' 8"  (1.727 m), weight 77.111 kg (170 lb), SpO2 94.00%. Strength out of 5 wound clean and dry  Disposition: Home     Medication List     As of 03/16/2012  7:25 AM    TAKE these medications         alendronate 70 MG tablet   Commonly known as: FOSAMAX   Take 70 mg by mouth every 7 (seven) days. Take with a full glass of water on an empty stomach.  Takes on Friday.      cyclobenzaprine 10 MG tablet   Commonly known as: FLEXERIL   Take 1 tablet (10 mg total) by mouth 3 (three) times daily as needed for muscle spasms.      cyclobenzaprine 10 MG tablet   Commonly known as: FLEXERIL   Take 10 mg by mouth 3 (three) times daily as needed.      ezetimibe-simvastatin 10-40 MG per tablet   Commonly known as: VYTORIN   Take 1 tablet by mouth at bedtime.      metoprolol tartrate 25 MG tablet   Commonly known as: LOPRESSOR   Take 25 mg by mouth 2 (two) times daily.      niacin 500 MG CR tablet   Commonly known as: NIASPAN   Take 500 mg by mouth at bedtime.      omeprazole 20 MG capsule   Commonly known as: PRILOSEC   Take 20 mg by mouth daily.     oxyCODONE-acetaminophen 5-325 MG per tablet   Commonly known as: PERCOCET/ROXICET   Take 1-2 tablets by mouth every 4 (four) hours as needed.      oxyCODONE-acetaminophen 10-325 MG per tablet   Commonly known as: PERCOCET   Take 1 tablet by mouth every 4 (four) hours as needed.      ramipril 2.5 MG capsule   Commonly known as: ALTACE   Take 2.5 mg by mouth daily.      Vitamin D (Ergocalciferol) 50000 UNITS Caps   Commonly known as: DRISDOL   Take 50,000 Units by mouth every 7 (seven) days. Takes on Friday         Signed: Maleigh Braun P 03/16/2012, 7:25 AM

## 2012-03-16 NOTE — Progress Notes (Signed)
Subjective: Patient reports The his legs feeling much better  Objective: Vital signs in last 24 hours: Temp:  [97.5 F (36.4 C)-98.4 F (36.9 C)] 97.8 F (36.6 C) (01/28 0346) Pulse Rate:  [51-76] 72  (01/28 0346) Resp:  [12-18] 18  (01/28 0346) BP: (93-141)/(62-97) 123/67 mmHg (01/28 0346) SpO2:  [93 %-100 %] 94 % (01/28 0346)  Intake/Output from previous day: 01/27 0701 - 01/28 0700 In: 1530 [P.O.:480; I.V.:1050] Out: 50 [Blood:50] Intake/Output this shift:    Strength out of 5 wound clean and dry  Lab Results:  [facility] 03/15/12 0921  WBC 11.1*  HGB 15.3  HCT 43.7  PLT 191   BMET  Basename 03/15/12 0921  NA 136  K 5.0  CL 99  CO2 27  GLUCOSE 100*  BUN 27*  CREATININE 0.97  CALCIUM 9.4    Studies/Results: Dg Chest 2 View  03/15/2012  *RADIOLOGY REPORT*  Clinical Data: Preoperative respiratory examination for lumbar spine surgery.  Cough and congestion.  CHEST - 2 VIEW  Comparison: Radiographs and CT 08/11/2006.  Findings: The heart size and mediastinal contours are stable. There is stable mild elevation of the left hemidiaphragm.  Overall basilar aeration has improved with resolution of previously demonstrated bibasilar atelectasis.  Diffuse emphysematous changes are again noted.  There is no pleural effusion or pneumothorax.  IMPRESSION: Resolved bibasilar atelectasis with stable findings of chronic obstructive pulmonary disease.  No acute cardiopulmonary process.   Original Report Authenticated By: Carey Bullocks, M.D.    Dg Lumbar Spine 2-3 Views  03/15/2012  *RADIOLOGY REPORT*  Clinical Data: L3 - L4 lumbar laminectomy  LUMBAR SPINE - 2-3 VIEW  Comparison: Lumbar spine MRI - 03/09/2012  Findings:  Lumbar spine labeling is in keeping with preprocedural lumbar spine MRI.  Two spot lateral projection intraoperative radiographs of the lumbar spine are provided for review.  Image labeled #1 demonstrates a radiopaque marking needle overlying the soft tissues posterior  to the L4 spinous process.  Imaged labeled #2 demonstrates the tip of a radiopaque surgical instrument overlying the soft tissues posterior to the caudal aspect of the L3 - L4 intervertebral disc space.  Additional radiopaque surgical instruments are seen about the operative site.  IMPRESSION: Intraoperative spinal localization as above.   Original Report Authenticated By: Tacey Ruiz, MD     Assessment/Plan: Discharge home  LOS: 1 day     Jamil Armwood P 03/16/2012, 7:23 AM

## 2012-11-25 ENCOUNTER — Other Ambulatory Visit: Payer: Self-pay | Admitting: Neurosurgery

## 2012-11-29 ENCOUNTER — Encounter (HOSPITAL_COMMUNITY): Payer: Self-pay | Admitting: Pharmacy Technician

## 2012-12-02 ENCOUNTER — Inpatient Hospital Stay (HOSPITAL_COMMUNITY): Admission: RE | Admit: 2012-12-02 | Payer: Medicare Other | Source: Ambulatory Visit

## 2012-12-02 ENCOUNTER — Other Ambulatory Visit (HOSPITAL_COMMUNITY): Payer: Medicare Other

## 2012-12-03 ENCOUNTER — Encounter (HOSPITAL_COMMUNITY): Payer: Self-pay

## 2012-12-03 ENCOUNTER — Encounter (HOSPITAL_COMMUNITY)
Admission: RE | Admit: 2012-12-03 | Discharge: 2012-12-03 | Disposition: A | Discharge status: Home / Self Care | Payer: Medicare Other | Source: Ambulatory Visit | Attending: Neurosurgery | Admitting: Neurosurgery

## 2012-12-03 DIAGNOSIS — Z01818 Encounter for other preprocedural examination: Secondary | ICD-10-CM | POA: Insufficient documentation

## 2012-12-03 DIAGNOSIS — Z01812 Encounter for preprocedural laboratory examination: Secondary | ICD-10-CM | POA: Insufficient documentation

## 2012-12-03 HISTORY — DX: Unspecified cataract: H26.9

## 2012-12-03 LAB — COMPREHENSIVE METABOLIC PANEL
AST: 18 U/L (ref 0–37)
Albumin: 3.5 g/dL (ref 3.5–5.2)
BUN: 16 mg/dL (ref 6–23)
Calcium: 9.3 mg/dL (ref 8.4–10.5)
Creatinine, Ser: 0.98 mg/dL (ref 0.50–1.35)
GFR calc Af Amer: >90 mL/min (ref >90)
GFR calc non Af Amer: 79 mL/min — ABNORMAL LOW (ref >90)
Sodium: 143 mEq/L (ref 135–145)
Total Bilirubin: 0.9 mg/dL (ref 0.3–1.2)
Total Protein: 6.7 g/dL (ref 6.0–8.3)

## 2012-12-03 LAB — CBC
HCT: 41.5 % (ref 39.0–52.0)
MCH: 30.5 pg (ref 26.0–34.0)
MCHC: 34.2 g/dL (ref 30.0–36.0)
MCV: 89.1 fL (ref 78.0–100.0)
Platelets: 145 10*3/uL — ABNORMAL LOW (ref 150–400)
RDW: 13.5 % (ref 11.5–15.5)
WBC: 7.6 10*3/uL (ref 4.0–10.5)

## 2012-12-03 LAB — SURGICAL PCR SCREEN: MRSA, PCR: NEGATIVE

## 2012-12-03 LAB — TYPE AND SCREEN
ABO/RH(D): O POS
Antibody Screen: NEGATIVE

## 2012-12-03 NOTE — Progress Notes (Deleted)
EKG already in chart.  Please ignore request to call Dr. Vanetta Shawl office on Monday.  DA

## 2012-12-03 NOTE — Pre-Procedure Instructions (Addendum)
AERON DONAGHEY  12/03/2012   Your procedure is scheduled on:  Wednesday, October 22nd   Report to [facility]  2 * 3 at 6:30 AM.             -(FREE Valet Parking)  Call this number if you have problems the morning of surgery: [phone]   Remember:   Do not eat food or drink liquids after midnight Tuesday.   Take these medicines the morning of surgery with A SIP OF WATER: Metoprolol, Omeprazole, Oxycodone, Albuterol inhaler.   Do not wear jewelry.  Do not wear lotions, powders, or colognes. You may wear deodorant.   Men may shave face and neck.   Do not bring valuables to the hospital.  [facility] is not responsible for any belongings or valuables.               Contacts, dentures or bridgework may not be worn into surgery.  Leave suitcase in the car. After surgery it may be brought to your room.  For patients admitted to the hospital, discharge time is determined by your treatment team.    Name and phone number of your driver:    Special Instructions: Shower using CHG 2 nights before surgery and the night before surgery.  If you shower the day of surgery use CHG.  Use special wash - you have one bottle of CHG for all showers.  You should use approximately 1/3 of the bottle for each shower.   Please read over the following fact sheets that you were given: Pain Booklet, Coughing and Deep Breathing, Blood Transfusion Information, MRSA Information and Surgical Site Infection Prevention

## 2012-12-03 NOTE — Progress Notes (Signed)
12/03/12 1424  OBSTRUCTIVE SLEEP APNEA  Have you ever been diagnosed with sleep apnea through a sleep study? No  Do you snore loudly (loud enough to be heard through closed doors)?  1  Do you often feel tired, fatigued, or sleepy during the daytime? 1 (gets up early every morning)  Has anyone observed you stop breathing during your sleep? 0  Do you have, or are you being treated for high blood pressure? 1  BMI more than 35 kg/m2? 0  Age over 75 years old? 1  Neck circumference greater than 40 cm/18 inches? 0  Gender: 1  Obstructive Sleep Apnea Score 5  Score 4 or greater  Results sent to PCP

## 2012-12-03 NOTE — Progress Notes (Addendum)
Echo, EKG, Stress test & LOV received from [facility] and inside chart.    DA

## 2012-12-06 NOTE — Progress Notes (Signed)
Anesthesia Chart Review:  Patient is a 77 year old male scheduled for L3-4, L4-5 PLIF on 12/08/12 by Dr. Wynetta Emery.  History includes L3-4 discectomy 03/15/12, former smoker, CAD/MI '02, GERD, HTN, COPD, diverticulosis, nephrolithiasis, cardiac dysrhythmia (VT) s/p accessory pathway ablation.  Cardiologist was reported as Dr. Bing Matter with [facility].  He was seen by his partner Dr. Tomie China on 11/04/12 for preoperative cardiology evaluation.  He felt that if patient's Lexiscan sestamibi for negative, he would not be a high risk for coronary events perioperatively (see below).  Nuclear stress test on 11/08/12 showed diaphragmatic attenuation and partial volume artifact in inferior wall on rest and stress, but no areas of vasodilator stress induced hypoperfusion. Normal left ventricular size and function with LVEF of 57%.  Echo on 08/26/12 showed normal LV systolic function, EF 59%. Abnormal diastolic function. Mild mitral regurgitation.  EKG on 11/04/12 showed NSR.  Preoperative CXR and labs noted.  If no acute changes, then I anticipate that he can proceed as planned.  Velna Ochs [facility] Short Stay Center/Anesthesiology Phone [phone] 12/06/2012 11:27 AM

## 2012-12-07 MED ORDER — CEFAZOLIN SODIUM-DEXTROSE 2-3 GM-% IV SOLR
2.0000 g | INTRAVENOUS | Status: AC
  Administered 2012-12-08: 2 g via INTRAVENOUS
  Filled 2012-12-07 (×2): qty 50

## 2012-12-08 ENCOUNTER — Encounter (HOSPITAL_COMMUNITY)
Admission: RE | Disposition: A | Discharge status: Skilled Nursing Facility | Payer: Medicare Other | Source: Ambulatory Visit | Attending: Neurosurgery

## 2012-12-08 ENCOUNTER — Inpatient Hospital Stay (HOSPITAL_COMMUNITY): Payer: Medicare Other

## 2012-12-08 ENCOUNTER — Inpatient Hospital Stay (HOSPITAL_COMMUNITY)
Admission: RE | Admit: 2012-12-08 | Discharge: 2012-12-11 | DRG: 460 | Disposition: A | Discharge status: Skilled Nursing Facility | Payer: Medicare Other | Source: Ambulatory Visit | Attending: Neurosurgery | Admitting: Neurosurgery

## 2012-12-08 ENCOUNTER — Encounter (HOSPITAL_COMMUNITY): Payer: Medicare Other | Admitting: Vascular Surgery

## 2012-12-08 ENCOUNTER — Inpatient Hospital Stay (HOSPITAL_COMMUNITY): Payer: Medicare Other | Admitting: Certified Registered"

## 2012-12-08 ENCOUNTER — Encounter (HOSPITAL_COMMUNITY): Payer: Self-pay | Admitting: *Deleted

## 2012-12-08 DIAGNOSIS — I10 Essential (primary) hypertension: Secondary | ICD-10-CM | POA: Diagnosis present

## 2012-12-08 DIAGNOSIS — M47817 Spondylosis without myelopathy or radiculopathy, lumbosacral region: Principal | ICD-10-CM | POA: Diagnosis present

## 2012-12-08 DIAGNOSIS — J4489 Other specified chronic obstructive pulmonary disease: Secondary | ICD-10-CM | POA: Diagnosis present

## 2012-12-08 DIAGNOSIS — J449 Chronic obstructive pulmonary disease, unspecified: Secondary | ICD-10-CM | POA: Diagnosis present

## 2012-12-08 DIAGNOSIS — K219 Gastro-esophageal reflux disease without esophagitis: Secondary | ICD-10-CM | POA: Diagnosis present

## 2012-12-08 DIAGNOSIS — M7130 Other bursal cyst, unspecified site: Secondary | ICD-10-CM | POA: Diagnosis present

## 2012-12-08 DIAGNOSIS — Z87891 Personal history of nicotine dependence: Secondary | ICD-10-CM

## 2012-12-08 DIAGNOSIS — I252 Old myocardial infarction: Secondary | ICD-10-CM

## 2012-12-08 DIAGNOSIS — M545 Low back pain: Secondary | ICD-10-CM

## 2012-12-08 DIAGNOSIS — M5126 Other intervertebral disc displacement, lumbar region: Secondary | ICD-10-CM | POA: Diagnosis present

## 2012-12-08 HISTORY — PX: MAXIMUM ACCESS (MAS)POSTERIOR LUMBAR INTERBODY FUSION (PLIF) 2 LEVEL: SHX6369

## 2012-12-08 SURGERY — FOR MAXIMUM ACCESS (MAS) POSTERIOR LUMBAR INTERBODY FUSION (PLIF) 2 LEVEL
Anesthesia: General | Site: Back | Wound class: Clean

## 2012-12-08 MED ORDER — BUPIVACAINE HCL (PF) 0.25 % IJ SOLN
INTRAMUSCULAR | Status: DC | PRN
  Administered 2012-12-08: 8 mL

## 2012-12-08 MED ORDER — PHENOL 1.4 % MT LIQD: 1.0000 | OROMUCOSAL | Status: DC | PRN

## 2012-12-08 MED ORDER — MUPIROCIN 2 % EX OINT
TOPICAL_OINTMENT | CUTANEOUS | Status: AC
  Filled 2012-12-08: qty 22

## 2012-12-08 MED ORDER — CYCLOBENZAPRINE HCL 10 MG PO TABS
10.0000 mg | ORAL_TABLET | Freq: Three times a day (TID) | ORAL | Status: DC | PRN
  Administered 2012-12-08 – 2012-12-10 (×2): 10 mg via ORAL
  Filled 2012-12-08 (×2): qty 1

## 2012-12-08 MED ORDER — RAMIPRIL 2.5 MG PO CAPS
2.5000 mg | ORAL_CAPSULE | Freq: Every day | ORAL | Status: DC
  Administered 2012-12-09: 2.5 mg via ORAL
  Filled 2012-12-08 (×3): qty 1

## 2012-12-08 MED ORDER — SODIUM CHLORIDE 0.9 % IJ SOLN: 3.0000 mL | INTRAMUSCULAR | Status: DC | PRN

## 2012-12-08 MED ORDER — DOCUSATE SODIUM 100 MG PO CAPS
100.0000 mg | ORAL_CAPSULE | Freq: Two times a day (BID) | ORAL | Status: DC
  Administered 2012-12-08 – 2012-12-11 (×6): 100 mg via ORAL
  Filled 2012-12-08 (×5): qty 1

## 2012-12-08 MED ORDER — LACTATED RINGERS IV SOLN
INTRAVENOUS | Status: DC | PRN
  Administered 2012-12-08 (×2): via INTRAVENOUS

## 2012-12-08 MED ORDER — MUPIROCIN 2 % EX OINT
TOPICAL_OINTMENT | Freq: Once | CUTANEOUS | Status: AC
  Administered 2012-12-08: 07:00:00 via NASAL

## 2012-12-08 MED ORDER — LIDOCAINE-EPINEPHRINE 1 %-1:100000 IJ SOLN
INTRAMUSCULAR | Status: DC | PRN
  Administered 2012-12-08: 10 mL

## 2012-12-08 MED ORDER — OXYCODONE-ACETAMINOPHEN 10-325 MG PO TABS: 1.0000 | ORAL_TABLET | ORAL | Status: DC | PRN

## 2012-12-08 MED ORDER — SUCCINYLCHOLINE CHLORIDE 20 MG/ML IJ SOLN
INTRAMUSCULAR | Status: DC | PRN
  Administered 2012-12-08: 100 mg via INTRAVENOUS

## 2012-12-08 MED ORDER — IBANDRONATE SODIUM 150 MG PO TABS: 150.0000 mg | ORAL_TABLET | ORAL | Status: DC

## 2012-12-08 MED ORDER — DIPHENHYDRAMINE HCL 50 MG/ML IJ SOLN
INTRAMUSCULAR | Status: DC | PRN
  Administered 2012-12-08: 6.25 mg via INTRAVENOUS

## 2012-12-08 MED ORDER — MIDAZOLAM HCL 5 MG/5ML IJ SOLN
INTRAMUSCULAR | Status: DC | PRN
  Administered 2012-12-08 (×2): 1 mg via INTRAVENOUS

## 2012-12-08 MED ORDER — OXYCODONE HCL 5 MG PO TABS
5.0000 mg | ORAL_TABLET | ORAL | Status: DC | PRN
  Administered 2012-12-08 – 2012-12-09 (×2): 5 mg via ORAL
  Filled 2012-12-08 (×2): qty 1

## 2012-12-08 MED ORDER — ASPIRIN EC 81 MG PO TBEC
81.0000 mg | DELAYED_RELEASE_TABLET | Freq: Every day | ORAL | Status: DC
  Administered 2012-12-09 – 2012-12-11 (×3): 81 mg via ORAL
  Filled 2012-12-08 (×3): qty 1

## 2012-12-08 MED ORDER — ONDANSETRON HCL 4 MG/2ML IJ SOLN
INTRAMUSCULAR | Status: DC | PRN
  Administered 2012-12-08: 4 mg via INTRAVENOUS

## 2012-12-08 MED ORDER — METOPROLOL SUCCINATE ER 25 MG PO TB24
25.0000 mg | ORAL_TABLET | Freq: Two times a day (BID) | ORAL | Status: DC
  Administered 2012-12-08 – 2012-12-11 (×4): 25 mg via ORAL
  Filled 2012-12-08 (×7): qty 1

## 2012-12-08 MED ORDER — LIDOCAINE HCL (CARDIAC) 20 MG/ML IV SOLN
INTRAVENOUS | Status: DC | PRN
  Administered 2012-12-08: 60 mg via INTRAVENOUS

## 2012-12-08 MED ORDER — PHENYLEPHRINE HCL 10 MG/ML IJ SOLN
INTRAMUSCULAR | Status: DC | PRN
  Administered 2012-12-08 (×4): 40 ug via INTRAVENOUS

## 2012-12-08 MED ORDER — ALENDRONATE SODIUM 70 MG PO TABS: 70.0000 mg | ORAL_TABLET | ORAL | Status: DC

## 2012-12-08 MED ORDER — ACETAMINOPHEN 650 MG RE SUPP: 650.0000 mg | RECTAL | Status: DC | PRN

## 2012-12-08 MED ORDER — SODIUM CHLORIDE 0.9 % IJ SOLN
3.0000 mL | Freq: Two times a day (BID) | INTRAMUSCULAR | Status: DC
  Administered 2012-12-09 – 2012-12-11 (×5): 3 mL via INTRAVENOUS

## 2012-12-08 MED ORDER — SODIUM CHLORIDE 0.9 % IV SOLN: 250.0000 mL | INTRAVENOUS | Status: DC

## 2012-12-08 MED ORDER — PROPOFOL INFUSION 10 MG/ML OPTIME
INTRAVENOUS | Status: DC | PRN
  Administered 2012-12-08: 50 ug/kg/min via INTRAVENOUS

## 2012-12-08 MED ORDER — ALUM & MAG HYDROXIDE-SIMETH 200-200-20 MG/5ML PO SUSP: 30.0000 mL | Freq: Four times a day (QID) | ORAL | Status: DC | PRN

## 2012-12-08 MED ORDER — MENTHOL 3 MG MT LOZG: 1.0000 | LOZENGE | OROMUCOSAL | Status: DC | PRN

## 2012-12-08 MED ORDER — PROPOFOL 10 MG/ML IV BOLUS
INTRAVENOUS | Status: DC | PRN
  Administered 2012-12-08: 20 mg via INTRAVENOUS
  Administered 2012-12-08: 150 mg via INTRAVENOUS

## 2012-12-08 MED ORDER — HYDROMORPHONE HCL PF 1 MG/ML IJ SOLN: 0.2500 mg | INTRAMUSCULAR | Status: DC | PRN

## 2012-12-08 MED ORDER — GLYCOPYRROLATE 0.2 MG/ML IJ SOLN
INTRAMUSCULAR | Status: DC | PRN
  Administered 2012-12-08 (×2): 0.1 mg via INTRAVENOUS

## 2012-12-08 MED ORDER — OXYCODONE-ACETAMINOPHEN 5-325 MG PO TABS
1.0000 | ORAL_TABLET | ORAL | Status: DC | PRN
  Administered 2012-12-08 – 2012-12-09 (×2): 1 via ORAL
  Filled 2012-12-08 (×2): qty 1

## 2012-12-08 MED ORDER — CEFAZOLIN SODIUM 1-5 GM-% IV SOLN
1.0000 g | Freq: Three times a day (TID) | INTRAVENOUS | Status: AC
  Administered 2012-12-08 – 2012-12-09 (×2): 1 g via INTRAVENOUS
  Filled 2012-12-08 (×2): qty 50

## 2012-12-08 MED ORDER — EPHEDRINE SULFATE 50 MG/ML IJ SOLN
INTRAMUSCULAR | Status: DC | PRN
  Administered 2012-12-08 (×7): 5 mg via INTRAVENOUS

## 2012-12-08 MED ORDER — THROMBIN 20000 UNITS EX SOLR
CUTANEOUS | Status: DC | PRN
  Administered 2012-12-08: 11:00:00 via TOPICAL

## 2012-12-08 MED ORDER — ACETAMINOPHEN 325 MG PO TABS: 650.0000 mg | ORAL_TABLET | ORAL | Status: DC | PRN

## 2012-12-08 MED ORDER — THROMBIN 5000 UNITS EX SOLR
OROMUCOSAL | Status: DC | PRN
  Administered 2012-12-08: 11:00:00 via TOPICAL

## 2012-12-08 MED ORDER — 0.9 % SODIUM CHLORIDE (POUR BTL) OPTIME
TOPICAL | Status: DC | PRN
  Administered 2012-12-08: 1000 mL

## 2012-12-08 MED ORDER — EZETIMIBE-SIMVASTATIN 10-40 MG PO TABS
1.0000 | ORAL_TABLET | Freq: Every day | ORAL | Status: DC
  Administered 2012-12-08 – 2012-12-09 (×2): 1 via ORAL
  Filled 2012-12-08 (×4): qty 1

## 2012-12-08 MED ORDER — OXYCODONE-ACETAMINOPHEN 5-325 MG PO TABS
1.0000 | ORAL_TABLET | ORAL | Status: DC | PRN
  Administered 2012-12-10: 1 via ORAL
  Filled 2012-12-08: qty 1

## 2012-12-08 MED ORDER — PANTOPRAZOLE SODIUM 40 MG PO TBEC
40.0000 mg | DELAYED_RELEASE_TABLET | Freq: Every day | ORAL | Status: DC
  Administered 2012-12-09 – 2012-12-11 (×3): 40 mg via ORAL
  Filled 2012-12-08 (×3): qty 1

## 2012-12-08 MED ORDER — HYDROMORPHONE HCL PF 1 MG/ML IJ SOLN
0.5000 mg | INTRAMUSCULAR | Status: DC | PRN
  Administered 2012-12-09: 1 mg via INTRAVENOUS
  Filled 2012-12-08: qty 1

## 2012-12-08 MED ORDER — ONDANSETRON HCL 4 MG/2ML IJ SOLN: 4.0000 mg | INTRAMUSCULAR | Status: DC | PRN

## 2012-12-08 MED ORDER — ALBUMIN HUMAN 5 % IV SOLN
INTRAVENOUS | Status: DC | PRN
  Administered 2012-12-08: 10:00:00 via INTRAVENOUS

## 2012-12-08 MED ORDER — PHENYLEPHRINE HCL 10 MG/ML IJ SOLN
10.0000 mg | INTRAVENOUS | Status: DC | PRN
  Administered 2012-12-08: 10 ug/min via INTRAVENOUS

## 2012-12-08 MED ORDER — ALBUTEROL SULFATE HFA 108 (90 BASE) MCG/ACT IN AERS: 2.0000 | INHALATION_SPRAY | Freq: Four times a day (QID) | RESPIRATORY_TRACT | Status: DC | PRN

## 2012-12-08 MED ORDER — SODIUM CHLORIDE 0.9 % IR SOLN
Status: DC | PRN
  Administered 2012-12-08: 11:00:00

## 2012-12-08 MED ORDER — FENTANYL CITRATE 0.05 MG/ML IJ SOLN
INTRAMUSCULAR | Status: DC | PRN
  Administered 2012-12-08: 150 ug via INTRAVENOUS
  Administered 2012-12-08: 100 ug via INTRAVENOUS

## 2012-12-08 MED ORDER — DEXAMETHASONE SODIUM PHOSPHATE 10 MG/ML IJ SOLN
INTRAMUSCULAR | Status: DC | PRN
  Administered 2012-12-08: 8 mg via INTRAVENOUS

## 2012-12-08 MED FILL — Heparin Sodium (Porcine) Inj 1000 Unit/ML: INTRAMUSCULAR | Qty: 30 | Status: AC

## 2012-12-08 MED FILL — Sodium Chloride IV Soln 0.9%: INTRAVENOUS | Qty: 1000 | Status: AC

## 2012-12-08 SURGICAL SUPPLY — 70 items
BAG DECANTER FOR FLEXI CONT (MISCELLANEOUS) ×2 IMPLANT
BENZOIN TINCTURE PRP APPL 2/3 (GAUZE/BANDAGES/DRESSINGS) ×2 IMPLANT
BLADE SURG 11 STRL SS (BLADE) ×2 IMPLANT
BLADE SURG ROTATE 9660 (MISCELLANEOUS) IMPLANT
BUR MATCHSTICK NEURO 3.0 LAGG (BURR) ×2 IMPLANT
CAGE COROENT 13X9X28-4 (Cage) ×4 IMPLANT
CANISTER SUCTION 2500CC (MISCELLANEOUS) ×2 IMPLANT
CLIP NEUROVISION LG (CLIP) ×2 IMPLANT
CONT SPEC 4OZ CLIKSEAL STRL BL (MISCELLANEOUS) ×4 IMPLANT
COVER BACK TABLE 24X17X13 BIG (DRAPES) IMPLANT
COVER TABLE BACK 60X90 (DRAPES) ×2 IMPLANT
DERMABOND ADHESIVE PROPEN (GAUZE/BANDAGES/DRESSINGS) ×1
DERMABOND ADVANCED .7 DNX6 (GAUZE/BANDAGES/DRESSINGS) ×1 IMPLANT
DRAPE C-ARM 42X72 X-RAY (DRAPES) ×2 IMPLANT
DRAPE C-ARMOR (DRAPES) ×2 IMPLANT
DRAPE LAPAROTOMY 100X72X124 (DRAPES) ×2 IMPLANT
DRAPE POUCH INSTRU U-SHP 10X18 (DRAPES) ×2 IMPLANT
DRAPE SURG 17X23 STRL (DRAPES) ×2 IMPLANT
DRESSING TELFA 8X3 (GAUZE/BANDAGES/DRESSINGS) IMPLANT
DRSG OPSITE 4X5.5 SM (GAUZE/BANDAGES/DRESSINGS) ×2 IMPLANT
DRSG OPSITE POSTOP 4X8 (GAUZE/BANDAGES/DRESSINGS) ×2 IMPLANT
DURAPREP 26ML APPLICATOR (WOUND CARE) ×2 IMPLANT
ELECT REM PT RETURN 9FT ADLT (ELECTROSURGICAL) ×2
ELECTRODE REM PT RTRN 9FT ADLT (ELECTROSURGICAL) ×1 IMPLANT
EVACUATOR 1/8 PVC DRAIN (DRAIN) ×2 IMPLANT
GAUZE SPONGE 4X4 16PLY XRAY LF (GAUZE/BANDAGES/DRESSINGS) ×2 IMPLANT
GLOVE BIO SURGEON STRL SZ8 (GLOVE) ×2 IMPLANT
GLOVE BIO SURGEON STRL SZ8.5 (GLOVE) ×2 IMPLANT
GLOVE BIOGEL M 8.0 STRL (GLOVE) ×2 IMPLANT
GLOVE INDICATOR 8.5 STRL (GLOVE) ×2 IMPLANT
GLOVE OPTIFIT SS 8.0 STRL (GLOVE) ×2 IMPLANT
GLOVE SS BIOGEL STRL SZ 8 (GLOVE) ×1 IMPLANT
GLOVE SUPERSENSE BIOGEL SZ 8 (GLOVE) ×1
GOWN BRE IMP SLV AUR LG STRL (GOWN DISPOSABLE) IMPLANT
GOWN BRE IMP SLV AUR XL STRL (GOWN DISPOSABLE) ×4 IMPLANT
GOWN STRL REIN 2XL LVL4 (GOWN DISPOSABLE) ×2 IMPLANT
HEMOSTAT POWDER KIT SURGIFOAM (HEMOSTASIS) ×2 IMPLANT
KIT BASIN OR (CUSTOM PROCEDURE TRAY) ×2 IMPLANT
KIT INFUSE XX SMALL 0.7CC (Orthopedic Implant) ×2 IMPLANT
KIT NEEDLE NVM5 EMG ELECT (KITS) ×1 IMPLANT
KIT NEEDLE NVM5 EMG ELECTRODE (KITS) ×1
KIT ROOM TURNOVER OR (KITS) ×2 IMPLANT
MILL MEDIUM DISP (BLADE) ×2 IMPLANT
NEEDLE HYPO 25X1 1.5 SAFETY (NEEDLE) ×2 IMPLANT
NS IRRIG 1000ML POUR BTL (IV SOLUTION) ×2 IMPLANT
PACK LAMINECTOMY NEURO (CUSTOM PROCEDURE TRAY) ×2 IMPLANT
PAD ARMBOARD 7.5X6 YLW CONV (MISCELLANEOUS) ×6 IMPLANT
PUTTY BONE DBX 5CC MIX (Putty) ×2 IMPLANT
ROD 70MM (Rod) ×1 IMPLANT
ROD PREBENT 80MM LUMBAR (Rod) ×2 IMPLANT
ROD SPNL 70XPREBNT NS MAS (Rod) ×1 IMPLANT
SCREW LOCK (Screw) ×6 IMPLANT
SCREW LOCK FXNS SPNE MAS PL (Screw) ×6 IMPLANT
SCREW SHANK 5.0X30MM (Screw) ×4 IMPLANT
SCREW SHANK 5.0X35 (Screw) ×4 IMPLANT
SCREW TULIP 5.5 (Screw) ×8 IMPLANT
SPONGE GAUZE 4X4 12PLY (GAUZE/BANDAGES/DRESSINGS) ×2 IMPLANT
SPONGE LAP 4X18 X RAY DECT (DISPOSABLE) IMPLANT
SPONGE SURGIFOAM ABS GEL 100 (HEMOSTASIS) ×2 IMPLANT
STRIP CLOSURE SKIN 1/2X4 (GAUZE/BANDAGES/DRESSINGS) ×4 IMPLANT
SUT VIC AB 0 CT1 18XCR BRD8 (SUTURE) ×2 IMPLANT
SUT VIC AB 0 CT1 8-18 (SUTURE) ×2
SUT VIC AB 2-0 CT1 18 (SUTURE) ×4 IMPLANT
SUT VICRYL 4-0 PS2 18IN ABS (SUTURE) ×2 IMPLANT
SYR 20ML ECCENTRIC (SYRINGE) ×2 IMPLANT
TAPE STRIPS DRAPE STRL (GAUZE/BANDAGES/DRESSINGS) ×2 IMPLANT
TOWEL OR 17X24 6PK STRL BLUE (TOWEL DISPOSABLE) ×2 IMPLANT
TOWEL OR 17X26 10 PK STRL BLUE (TOWEL DISPOSABLE) ×2 IMPLANT
TRAY FOLEY CATH 16FRSI W/METER (SET/KITS/TRAYS/PACK) ×2 IMPLANT
WATER STERILE IRR 1000ML POUR (IV SOLUTION) ×2 IMPLANT

## 2012-12-08 NOTE — Progress Notes (Addendum)
PHARMACIST - PHYSICIAN COMMUNICATION  CONCERNING: P&T Medication Policy Regarding Oral Bisphosphonates  RECOMMENDATION: Your order for alendronate (Fosamax) and ibandronate (Boniva) has been discontinued at this time.  If the patient's post-hospital medical condition warrants safe use of this class of drugs, please resume the pre-hospital regimen upon discharge.  DESCRIPTION:  Alendronate (Fosamax) and ibandronate (Boniva) can cause severe esophageal erosions in patients who are unable to remain upright at least 30 minutes after taking this medication.   Since brief interruptions in therapy are thought to have minimal impact on bone mineral density, the Pharmacy & Therapeutics Committee has established that bisphosphonate orders should be routinely discontinued during hospitalization.   To override this safety policy and permit administration of Fosamax in the hospital, prescribers must write "DO NOT HOLD" in the comments section when placing the order for this class of medications.  Dorna Leitz, PharmD, BCPS

## 2012-12-08 NOTE — Progress Notes (Signed)
Patient arrived to room via bed from PACU, alert but appears lethargic and arousal at this time. Safety precautions reviewed with patient. Bed exit activated. Repositioned patient. Will reinforce safety precautions.  Sim Boast, RN, 12/08/12 1500

## 2012-12-08 NOTE — Preoperative (Signed)
Beta Blockers   Reason not to administer Beta Blockers:Not Applicable 

## 2012-12-08 NOTE — Anesthesia Preprocedure Evaluation (Addendum)
Anesthesia Evaluation  Patient identified by MRN, date of birth, ID band Patient awake    Reviewed: Allergy & Precautions, H&P , NPO status , Patient's Chart, lab work & pertinent test results, reviewed documented beta blocker date and time   Airway Mallampati: II TM Distance: >3 FB     Dental  (+) Poor Dentition, Chipped, Missing, Loose and Dental Advisory Given   Pulmonary COPD COPD inhaler, former smoker,  breath sounds clear to auscultation        Cardiovascular hypertension, Pt. on medications and Pt. on home beta blockers + Past MI and + Cardiac Stents Rhythm:Regular Rate:Normal     Neuro/Psych    GI/Hepatic GERD-  ,  Endo/Other    Renal/GU Renal disease     Musculoskeletal   Abdominal (+)  Abdomen: soft. Bowel sounds: normal.  Peds  Hematology   Anesthesia Other Findings MI in 2003 cardiac stent placed in [place], no chest pain or complications since 03.  EF 55%.  Pt states he is followed frequently by cardiology.  ECHO and notes in chart. Carlynn Herald, CRNA  Reproductive/Obstetrics                        Anesthesia Physical Anesthesia Plan  ASA: III  Anesthesia Plan: General   Post-op Pain Management:    Induction: Intravenous  Airway Management Planned: Oral ETT  Additional Equipment:   Intra-op Plan:   Post-operative Plan: Extubation in OR  Informed Consent: I have reviewed the patients History and Physical, chart, labs and discussed the procedure including the risks, benefits and alternatives for the proposed anesthesia with the patient or authorized representative who has indicated his/her understanding and acceptance.   Dental advisory given  Plan Discussed with: CRNA, Anesthesiologist and Surgeon  Anesthesia Plan Comments:         Anesthesia Quick Evaluation

## 2012-12-08 NOTE — H&P (Signed)
Braun Braun an 77 y.o. male.   Chief Complaint: Back and right leg HPI: 77 year old gentleman presents with recurrent back pain having several months out from a laminectomy and discectomy were he did very well back Braun rating down his right leg down the lateral anterior thigh occasionally into the front of her shin consistent with L3 and L4 nerve root pattern workup revealed development of a synovial cyst on the right L3-4 and evidence of instability on dynamic films as well as a large Schmorl's node degeneration and instability and deformity at L4-5 the due to patient's failed conservative treatment imaging findings and progression of clinical syndrome I recommended and interbody fusion L3-4 and L4-5 fusion without decompression I extensively the risks benefits of that with the patient as well as perioperative course and expectations alternatives of surgery he understood and agreed to proceed forward.  Past Medical History  Diagnosis Date  . Myocardial infarction 2002  . Compression fracture     BAck  . Kidney stones   . Diverticulosis     per colonscopy  . GERD (gastroesophageal reflux disease)   . Cataracts, bilateral   . Hypertension   . COPD (chronic obstructive pulmonary disease)     Past Surgical History  Procedure Laterality Date  . Vt study      with Ablation  . Hip fracture surgery  2008    right  . Rotator cuff repair      bil   . Lithotripsy    . Colonscopy    . Cardiac catheterization  2002,    stents   . Lumbar laminectomy/decompression microdiscectomy  03/15/2012    Procedure: LUMBAR LAMINECTOMY/DECOMPRESSION MICRODISCECTOMY 1 LEVEL;  Surgeon: Mariam Dollar, MD;  Location: [facility];  Service: Neurosurgery;  Laterality: Left;  Left lumbar three-four decompressive lumbar laminectomy, discectomy  . Salivary stone removal      1964    No family history on file. Social History:  reports that he quit smoking about 11 years ago. His smoking use included Cigarettes.  He has a 45 pack-year smoking history. He does not have any smokeless tobacco history on file. He reports that he does not drink alcohol or use illicit drugs.  Allergies: No Known Allergies  Medications Prior to Admission  Medication Sig Dispense Refill  . albuterol (PROVENTIL HFA;VENTOLIN HFA) 108 (90 BASE) MCG/ACT inhaler Inhale 2 puffs into the lungs every 6 (six) hours as needed for wheezing.      Marland Kitchen aspirin EC 81 MG tablet Take 81 mg by mouth daily.      Marland Kitchen ezetimibe-simvastatin (VYTORIN) 10-40 MG per tablet Take 1 tablet by mouth at bedtime.      . ibandronate (BONIVA) 150 MG tablet Take 150 mg by mouth every 30 (thirty) days. Take on 1st of the month Take in the morning with a full glass of water, on an empty stomach, and do not take anything else by mouth or lie down for the next 30 min.      . metoprolol succinate (TOPROL-XL) 25 MG 24 hr tablet Take 25 mg by mouth 2 (two) times daily.      Marland Kitchen omeprazole (PRILOSEC) 20 MG capsule Take 20 mg by mouth daily.      Marland Kitchen oxyCODONE-acetaminophen (PERCOCET) 10-325 MG per tablet Take 1 tablet by mouth every 4 (four) hours as needed.      Marland Kitchen oxyCODONE-acetaminophen (PERCOCET/ROXICET) 5-325 MG per tablet Take 1-2 tablets by mouth every 4 (four) hours as needed.  80  tablet  0  . ramipril (ALTACE) 2.5 MG capsule Take 2.5 mg by mouth daily.      Marland Kitchen alendronate (FOSAMAX) 70 MG tablet Take 70 mg by mouth every 7 (seven) days. Take with a full glass of water on an empty stomach. Takes on Friday.        No results found for this or any previous visit (from the past 48 hour(s)). No results found.  Review of Systems  Constitutional: Negative.   HENT: Negative.   Eyes: Negative.   Respiratory: Negative.   Cardiovascular: Negative.   Gastrointestinal: Negative.   Genitourinary: Negative.   Musculoskeletal: Positive for back pain, joint pain and myalgias.  Skin: Negative.   Neurological: Positive for tremors and sensory change.  Psychiatric/Behavioral:  Negative.     Blood pressure 115/62, pulse 67, temperature 97.9 F (36.6 C), temperature source Oral, resp. rate 16, SpO2 99.00%. Physical Exam  Constitutional: He Braun oriented to person, place, and time. He appears well-developed and well-nourished.  Eyes: Pupils are equal, round, and reactive to light.  Neck: Normal range of motion.  Respiratory: Effort normal.  GI: Soft.  Neurological: He Braun alert and oriented to person, place, and time. He has normal strength. GCS eye subscore Braun 4. GCS verbal subscore Braun 5. GCS motor subscore Braun 6.  Reflex Scores:      Patellar reflexes are 0 on the right side and 0 on the left side.      Achilles reflexes are 0 on the right side and 0 on the left side. Strength Braun 5 out of 5 in his iliopsoas, quads, hip she's, gastrocs, and tibialis, EHL.     Assessment/Plan 77 year old presents for L3-4 posterior lumbar interbody fusion L4-5 posterior lateral fusion  Braun Braun P 12/08/2012, 8:23 AM

## 2012-12-08 NOTE — OR Nursing (Signed)
After administration of anesthesia, a 16 french foley was inserted by Sandy Salaam RN. Jeralyn Ruths RN assisted with the foley. No difficulties were encountered with the foley insertion, clear yellow urine was returned prior to inflating the bulb. The scrotum's appearance was unremarkable. Upon flipping the patient onto the operating table, the patient's scrotum began swelling noticeably. The scrotum's size was approximately 15 cm across and taut. Dr. Wynetta Emery was present and said to flip the patient back supine onto the stretcher. Dr. Wynetta Emery called the patient's family and other physicians and it was discovered that the patient had a hydrocele that had not been yet treated. Dr. Wynetta Emery chose to continue with the procedure and the scrotum was padded with foam upon flipping to the operating table.

## 2012-12-08 NOTE — Anesthesia Postprocedure Evaluation (Signed)
  Anesthesia Post-op Note  Patient: Chris Braun  Procedure(s) Performed: Procedure(s) with comments: FOR MAXIMUM ACCESS (MAS) POSTERIOR LUMBAR INTERBODY FUSION (PLIF) 2 LEVEL (N/A) - FOR MAXIMUM ACCESS (MAS) POSTERIOR LUMBAR INTERBODY FUSION (PLIF) 2 LEVEL  Patient Location: PACU  Anesthesia Type:General  Level of Consciousness: awake  Airway and Oxygen Therapy: Patient Spontanous Breathing  Post-op Pain: mild  Post-op Assessment: Post-op Vital signs reviewed  Post-op Vital Signs: Reviewed  Complications: No apparent anesthesia complications

## 2012-12-08 NOTE — Transfer of Care (Signed)
Immediate Anesthesia Transfer of Care Note  Patient: Chris Braun  Procedure(s) Performed: Procedure(s) with comments: FOR MAXIMUM ACCESS (MAS) POSTERIOR LUMBAR INTERBODY FUSION (PLIF) 2 LEVEL (N/A) - FOR MAXIMUM ACCESS (MAS) POSTERIOR LUMBAR INTERBODY FUSION (PLIF) 2 LEVEL  Patient Location: PACU  Anesthesia Type:General  Level of Consciousness: sedated  Airway & Oxygen Therapy: Patient connected to face mask oxygen  Post-op Assessment: Report given to PACU RN  Post vital signs: stable  Complications: No apparent anesthesia complications

## 2012-12-08 NOTE — Anesthesia Procedure Notes (Signed)
Procedure Name: Intubation Date/Time: 12/08/2012 8:43 AM Performed by: Ellin Goodie Pre-anesthesia Checklist: Patient identified, Emergency Drugs available, Suction available, Patient being monitored and Timeout performed Patient Re-evaluated:Patient Re-evaluated prior to inductionOxygen Delivery Method: Circle system utilized Preoxygenation: Pre-oxygenation with 100% oxygen Intubation Type: IV induction Ventilation: Mask ventilation without difficulty Laryngoscope Size: Mac and 3 Grade View: Grade II Tube type: Oral Tube size: 7.5 mm Number of attempts: 1 Airway Equipment and Method: Stylet Placement Confirmation: ETT inserted through vocal cords under direct vision,  positive ETCO2 and breath sounds checked- equal and bilateral Secured at: 22 cm Tube secured with: Tape Dental Injury: Teeth and Oropharynx as per pre-operative assessment  Comments: Easy atraumatic induction and intubation with MAC 3 blade, poor dentition noted and dental advisory given, Dr. Randa Evens verified placement of ETT.  Carlynn Herald, CRNA

## 2012-12-08 NOTE — Op Note (Signed)
Preoperative diagnosis: Severe lumbar spinal stenosis L3-4 with severe foraminal stenosis from facet arthropathy and a synovial cyst on the right at L3-4. As well as a Schmorl's node evidence of instability L4-5  Postoperative diagnosis: Same as well as recurrent disc herniation at L3-4 on the left.  Procedure: Decompressive lumbar laminectomy in excess and requiring more work than would be required with a standard interbody fusion complete medial facetectomy and radical foraminotomies of the L3 and L4 nerve roots bilaterally  #2 posterior lumbar interbody fusion L3-4 using the invasive cages packed with local autograft mixed with DBX and BMP  #3 cortical screw fixation L3-L5 using the invasive cortical screw system  #4 posterior lateral facet and lateral pars fusion L4-5 using locally harvested autograft mixed with DBX and BMP  Surgeon: Chris Braun  Assistant: Tressie Stalker  Anesthesia: Gen.  EBL: 400 with 100 Cell Saver given back  History of present illness: Patient is a very pleasant 77 year old woman had progressive worsening right leg pain undergone previous laminectomy discectomy several months ago and did well initially however over the last several weeks and months has developed progressive worsening back and contralateral right leg pain workup revealed a large synovial cyst on the right and instability L3-4 as well as a large Schmorl's node motor changes and instability L4-5. Patient's pathology was consistent with a right L3 and L4 nerve root pattern and due to his progression of clinical syndrome and imaging findings consistent with synovial cyst instability and failure conservative treatment I recommended decompression stabilization procedure at L3-4 and stabilization procedure at L4-5. I extensively reviewed the risks and benefits of the operation with the patient as well as perioperative course and expectations of outcome and alternatives of surgery and he understood and agreed to  proceed forward.  Operative procedure: Patient brought into the or was induced under general anesthesia positioned prone on flat rolls I was unable to rotate his shoulder and opted his arms extended so a Tuck and a size proximal up on blankets to get out of the way the x-ray. His back was prepped and draped in routine sterile fashion his old incision was infiltrated with 10 cc lidocaine with epi and extended cephalad caudally the scar tissue and subperiosteal dissection care lamina of L2-3-4 and 5 bilaterally. After adequate exposure been attained back to the facet complexes exposing the lateral pars at 34 and 45 AP lateral fluoroscopy identified and 2 points for the cortical screw placement in the pars and pedicle of L3 and part of the pedicle of L5 bilaterally using a 2 points consistent with clock face position the 5:00 and 7:00 respectively at each location pilot holes were drilled a high-speed drill and under direct fluoroscopic guidance these holes were drilled as well as under EMG monitoring. All holes were drilled to approximately 35 mm. He was subsequently tapped under fluoroscopy and EMG monitoring as well as probed again and 35 mm screws were placed at each level. All monitoring revealed never numbers less than 20 pedicles probed completely competent with no mediolateral breech and all screws excellent purchase. After all 4 cortical screws are then placed attention second decompression the spinous process of L3 was removed two thirds of the lamina complex of L3 was then subsequently removed and bleeding facetectomies were performed under biting of the lateral facet and pars helped identify the exiting L3 nerve root was markedly stenotic there was a large synovial cyst on the right was teased off of the dura although was densely adherent. And upon  removal the significantly decompressed the lateral canal as well as inferior L3 nerve root superior L4 nerve root. Working on the left side there was a dense  amount of scar tissues dissected free and the lateral facet was able be removed aggressive undergoing of the superior tickling facet on both sides allowed to the patient throughout the lateral disc space. Both L4 nerve roots were identified as well as skeletonized flush with pedicle and both L3 nerve roots were also identified decompressed and skeletonized flush with pedicle. Then the disc space epidural veins cartilages spaces cleanout bilaterally I distractor was inserted the patient's right side and working on the left of the scar tissue disc spaces entered a large recurrent disc herniation was removed I. the endplates were prepared the distractor was a 13 mm of the appropriate sizing for the implant after adequate endplate preparation been achieved a trial was inserted a 13 mm x 20 mm cages and subsequent selected this was placed to rotate after being packed with local are graft mixed DBX. The work on the contralateral side endplates were didn't paracentral disc was removed central endplates also been local autograft and a small piece of BMP sponges packed anterolaterally and anteriorly the space and the second cage was inserted after being packed with local are graft mixed DBX. Then after all the cage in place to a scope see her good fixing space was maintained aggressive decortication was carried out on the lamina lateral pars and facet complexes between L4-L5 local autograft mixed and BMP was then packed posterior laterally from 4-5 screw in the facet complexes lateral pars. Then in the foraminal reinspected the heads were attached to the screws the 4 screws were placed also under direct fluoroscopic guidance with EMG monitoring and after all screws in place adequately with no abnormal EMG tracings or the pedicles, probed constantly. The heads were attached rods were selected 70 mm 1 site 80 on the other top tightness tightened down the L3 screws compressed against L4 the foramina were then reinspected  confirm patency top tightness tightened down and can place him Gelfoam was overlaid top of the dura a Hemovac drain was placed and the wounds closed in layers with after Vicryl the skin was closed running 4 subcuticular benzo and Steri-Strips were applied as well as Dermabond the patient recovered in stable condition. At the end of case on it counts sponge counts were correct.

## 2012-12-09 ENCOUNTER — Encounter (HOSPITAL_COMMUNITY): Payer: Self-pay

## 2012-12-09 MED ORDER — SODIUM CHLORIDE 0.9 % IV BOLUS (SEPSIS)
1000.0000 mL | Freq: Once | INTRAVENOUS | Status: AC
  Administered 2012-12-09: 1000 mL via INTRAVENOUS

## 2012-12-09 NOTE — Progress Notes (Signed)
Advised patient's daughter that we are awaiting PT and OT eval and recommendations for d/c needs- at that time we will fax clinicals to [facility] for authorization/determination- Reece Levy, MSW, Theresia Majors [phone]

## 2012-12-09 NOTE — Progress Notes (Signed)
Clinical Social Work Department BRIEF PSYCHOSOCIAL ASSESSMENT 12/09/2012  Patient:  Chris Braun, Chris Braun     Account Number:  [id number]     Admit date:  12/08/2012  Clinical Social Worker:  Robin Searing  Date/Time:  12/09/2012 02:34 PM  Referred by:    Date Referred:  12/09/2012 Referred for  SNF Placement   Other Referral:   Interview type:  Family Other interview type:   Daughter contacted CSW by phone [phone]    PSYCHOSOCIAL DATA Living Status:  FAMILY Admitted from facility:   Level of care:   Primary support name:  daughter Chris Braun Primary support relationship to patient:  FAMILY Degree of support available:   good but minimal assist    CURRENT CONCERNS Current Concerns  Post-Acute Placement   Other Concerns:    SOCIAL WORK ASSESSMENT / PLAN Patient admitted from home where he lives with his wife- she is apparently in SNF rehab at [facility] and they family wants to look at getting Chris Braun there with her-   Assessment/plan status:  Other - See comment Other assessment/ plan:   FL2 and PASARR completion   Information/referral to community resources:   SNF list  [facility] auth    PATIENT'S/FAMILY'S RESPONSE TO PLAN OF CARE: Explained to daughter that we would send clinicals to [facility] for SNF auth and pursue SNF placement at [facility] as requested and       Reece Levy, MSW, [employer] [phone]

## 2012-12-09 NOTE — Progress Notes (Addendum)
Pt had decrease in BP and hypotensive, MD Wynetta Emery notified and new order received to adm 1L NS bolus. Bolus going and pt resting comfortable in bed with daughter at bedside. Pt denies any discomfort or dizziness. Will continue to monitor quietly.  1552 Pt bolus still going and incoming RN notified.

## 2012-12-09 NOTE — Evaluation (Signed)
Occupational Therapy Evaluation Patient Details Name: Chris Braun MRN: [id number] DOB: 1935-06-04 Today's Date: 12/09/2012 Time: 0865-7846 OT Time Calculation (min): 28 min  OT Assessment / Plan / Recommendation History of present illness HPI: 77 year old gentleman presents with recurrent back pain having several months out from a laminectomy and discectomy.  Pt is now s/p POSTERIOR LUMBAR INTERBODY FUSION (PLIF) 2 LEVEL     Clinical Impression   Patient is s/p 2 level fusion surgery resulting in functional limitations due to the deficits listed below (see OT problem list).  Patient will benefit from skilled OT acutely to increase independence and safety with ADLS to allow discharge SNF.     OT Assessment  Patient needs continued OT Services    Follow Up Recommendations  SNF;Supervision/Assistance - 24 hour    Barriers to Discharge      Equipment Recommendations  Other (comment) (defer SNF)    Recommendations for Other Services    Frequency  Min 1X/week    Precautions / Restrictions Precautions Precautions: Back;Fall Precaution Booklet Issued: Yes (comment) Precaution Comments: handout placed in room and will need reviewing next session. Pt with decr BP and poor recall Required Braces or Orthoses: Spinal Brace Spinal Brace: Lumbar corset Restrictions Weight Bearing Restrictions: No   Pertinent Vitals/Pain BP 88/53    ADL  Grooming: Wash/dry hands;Min guard Where Assessed - Grooming: Supported standing Upper Body Dressing: Moderate assistance Where Assessed - Upper Body Dressing: Supported sitting Lower Body Dressing: Maximal assistance Where Assessed - Lower Body Dressing: Supported sit to Pharmacist, hospital: Moderate assistance Toilet Transfer Method: Sit to stand Toilet Transfer Equipment: Regular height toilet;Grab bars Toileting - Clothing Manipulation and Hygiene: Minimal assistance Where Assessed - Toileting Clothing Manipulation and Hygiene: Sit to  stand from 3-in-1 or toilet Equipment Used: Back brace;Gait belt;Rolling walker Transfers/Ambulation Related to ADLs: Pt with ashen appears with mobility. pt with decr BP 88/53 adn reports dizziness. Pt with poor awareness of BP decr ADL Comments: Pt sitting in chair for > 4 hours. Pt reports being in chair the entire day. Pt requesting bathroom transfer and voiding bowel and bladder. Pt returned to supine and educated on back precautions and limited sitting during the day to keep pain under control. Pt states "i over did it today"    OT Diagnosis: Generalized weakness;Acute pain  OT Problem List: Decreased strength;Decreased activity tolerance;Impaired balance (sitting and/or standing);Decreased safety awareness;Decreased knowledge of use of DME or AE;Decreased knowledge of precautions;Pain OT Treatment Interventions: Self-care/ADL training;DME and/or AE instruction;Therapeutic activities;Patient/family education;Balance training   OT Goals(Current goals can be found in the care plan section) Acute Rehab OT Goals Patient Stated Goal: to get stronger before he goes home OT Goal Formulation: With patient Time For Goal Achievement: 12/23/12 Potential to Achieve Goals: Good  Visit Information  Last OT Received On: 12/09/12 Assistance Needed: +1 PT/OT Co-Evaluation/Treatment: Yes History of Present Illness: HPI: 77 year old gentleman presents with recurrent back pain having several months out from a laminectomy and discectomy.  Pt is now s/p POSTERIOR LUMBAR INTERBODY FUSION (PLIF) 2 LEVEL         Prior Functioning     Home Living Family/patient expects to be discharged to:: Skilled nursing facility ([facility]) Living Arrangements: Spouse/significant other;Children;Other relatives Available Help at Discharge: Family Type of Home: House Home Access: Stairs to enter [employer] of Steps: 1 Entrance Stairs-Rails: None Home Layout: One level Home Equipment: Walker - 2  wheels;Cane - single point;Shower seat Prior Function Level of Independence: Independent with assistive  device(s) (used rw in the mornings) Communication Communication: HOH Dominant Hand: Right         Vision/Perception Vision - History Baseline Vision: No visual deficits Patient Visual Report: No change from baseline   Cognition  Cognition Arousal/Alertness: Lethargic Behavior During Therapy: Flat affect Overall Cognitive Status: No family/caregiver present to determine baseline cognitive functioning Memory: Decreased short-term memory    Extremity/Trunk Assessment Upper Extremity Assessment Upper Extremity Assessment: Overall WFL for tasks assessed Lower Extremity Assessment Lower Extremity Assessment: Defer to PT evaluation Cervical / Trunk Assessment Cervical / Trunk Assessment: Normal     Mobility Bed Mobility Bed Mobility: Rolling Right;Sit to Sidelying Left Rolling Right: 4: Min guard Sit to Sidelying Left: 4: Min assist Details for Bed Mobility Assistance: cues for hand placement and safety Transfers Sit to Stand: 4: Min guard;From chair/3-in-1;From toilet Stand to Sit: 4: Min guard;To bed;To toilet Details for Transfer Assistance: cues for hand placement and safety     Exercise     Balance Balance Balance Assessed: Yes Static Sitting Balance Static Sitting - Balance Support: Feet supported Static Sitting - Level of Assistance: 7: Independent Static Standing Balance Static Standing - Balance Support: During functional activity Static Standing - Level of Assistance: 5: Stand by assistance Static Standing - Comment/# of Minutes: VCs for approaching countertop and positioning for safety during standing activities   End of Session OT - End of Session Activity Tolerance: Treatment limited secondary to medical complications (Comment) (decr BP ) Patient left: in bed;with call bell/phone within reach;with bed alarm set;with nursing/sitter in room Nurse  Communication: Mobility status;Precautions  GO     Harolyn Rutherford 12/09/2012, 4:12 PM Pager: [phone]

## 2012-12-09 NOTE — Progress Notes (Signed)
OT NOTE  Pt reports wife is at [employer] in [place] s/p open heart surgery. Pt requesting to rehab in [place] with wife. SW Marylu Lund notified by therapy of patients request  Mateo Flow   OTR/L Pager: [phone] Office: [phone] .

## 2012-12-09 NOTE — Progress Notes (Signed)
Subjective: Patient reports He is doing well leg pain is significantly improved no new numbness or tingling back pain is managed on the current medications.  Objective: Vital signs in last 24 hours: Temp:  [97 F (36.1 C)-98.1 F (36.7 C)] 98 F (36.7 C) (10/23 0507) Pulse Rate:  [68-87] 71 (10/23 0507) Resp:  [14-22] 17 (10/23 0507) BP: (98-125)/(61-71) 117/61 mmHg (10/23 0507) SpO2:  [95 %-99 %] 97 % (10/23 0200) Weight:  [72.207 kg (159 lb 3 oz)] 72.207 kg (159 lb 3 oz) (10/22 2300)  Intake/Output from previous day: 10/22 0701 - 10/23 0700 In: 2540 [P.O.:240; I.V.:1900; Blood:100; IV Piggyback:300] Out: 3225 [Urine:2650; Drains:225; Blood:350] Intake/Output this shift:    Awake alert oriented strength 5 out of 5 wound is clean and dry  Lab Results: No results found for this basename: WBC, HGB, HCT, PLT,  in the last 72 hours BMET No results found for this basename: NA, K, CL, CO2, GLUCOSE, BUN, CREATININE, CALCIUM,  in the last 72 hours  Studies/Results: Dg Lumbar Spine 2-3 Views  12/08/2012   CLINICAL DATA:  No given history of  EXAM: LUMBAR SPINE - 2-3 VIEW  COMPARISON:  MRI 10/27/2012.  FINDINGS: AP and lateral views show a decompression and fusion in progress from L3 through L5. Pedicle screws are in the process of being placed. There has been discectomy with interbody fusion material at L3-4. There is an old compression deformity of L5. Pedicle screws at that level are at the junction of the pedicles and vertebral body.  IMPRESSION: Decompression, diskectomy and fusion in progress from L3 through L5.   Electronically Signed   By: Paulina Fusi M.D.   On: 12/08/2012 13:45    Assessment/Plan: Progressive mobilization today with physical therapy possible discharge tomorrow  LOS: 1 day     Cledith Kamiya P 12/09/2012, 7:25 AM

## 2012-12-09 NOTE — Evaluation (Addendum)
Physical Therapy Evaluation Patient Details Name: Chris Braun MRN: [id number] DOB: 01-17-1936 Today's Date: 12/09/2012 Time: 4098-1191 PT Time Calculation (min): 26 min  PT Assessment / Plan / Recommendation History of Present Illness  HPI: 77 year old gentleman presents with recurrent back pain having several months out from a laminectomy and discectomy.  Pt is now s/p POSTERIOR LUMBAR INTERBODY FUSION (PLIF) 2 LEVEL    Clinical Impression  Patient demonstrates deficits in functional mobility as indicated below. Patient will benefit from continued skilled PT to address deficits and maximize function. During activity today, patient easily fatigued with increased dizziness during ambulation. Vitals assessed, BP soft 88/53 nsg aware.  Based on functional levels and decreased caregiver support, recommend ST SNF upon discharge. Will continue to see as indicated and progress activity as tolerated.    PT Assessment  Patient needs continued PT services    Follow Up Recommendations  SNF    Does the patient have the potential to tolerate intense rehabilitation      Barriers to Discharge Decreased caregiver support patient spouse at [facility] following open heart surgery, she will not be able to provide assist at home    Equipment Recommendations  None recommended by PT    Recommendations for Other Services     Frequency Min 5X/week    Precautions / Restrictions Precautions Precautions: Back;Fall Precaution Booklet Issued: Yes (comment) Required Braces or Orthoses: Spinal Brace Spinal Brace: Lumbar corset Restrictions Weight Bearing Restrictions: No   Pertinent Vitals/Pain 4/10 pain, BP 88/53 post ambulation      Mobility  Bed Mobility Bed Mobility: Rolling Right;Sit to Sidelying Left Rolling Right: 4: Min guard Sit to Sidelying Left: 4: Min assist Transfers Transfers: Sit to Stand;Stand to Sit Sit to Stand: 4: Min guard;From chair/3-in-1;From toilet Stand to Sit:  4: Min guard;To bed;To toilet Ambulation/Gait Ambulation/Gait Assistance: 4: Min guard;4: Min Environmental consultant (Feet): 34 Feet Assistive device: Rolling walker Ambulation/Gait Assistance Details: Patient with some increased dizziness with mabulation, BP 88/53 nsg aware, min guard for stability Gait Pattern: Step-to pattern;Decreased stride length Gait velocity: decreased        PT Diagnosis: Difficulty walking;Abnormality of gait;Acute pain  PT Problem List: Decreased strength;Decreased range of motion;Decreased activity tolerance;Decreased balance;Decreased mobility;Decreased knowledge of use of DME PT Treatment Interventions: DME instruction;Gait training;Stair training;Functional mobility training;Therapeutic activities;Therapeutic exercise;Patient/family education     PT Goals(Current goals can be found in the care plan section) Acute Rehab PT Goals Patient Stated Goal: to get stronger before he goes home PT Goal Formulation: With patient Time For Goal Achievement: 12/23/12 Potential to Achieve Goals: Good  Visit Information  Last PT Received On: 12/09/12 Assistance Needed: +1 History of Present Illness: HPI: 77 year old gentleman presents with recurrent back pain having several months out from a laminectomy and discectomy were he did very well back is rating down his right leg down the lateral anterior thigh occasionally into the front of her shin consistent with L3 and L4 nerve root pattern workup revealed development of a 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  Prior Functioning  Home Living Family/patient expects to be discharged to:: Private residence Living Arrangements: Spouse/significant other;Children;Other relatives Available Help at Discharge: Family Type of Home: House Home Access: Stairs to enter [employer] of Steps: 1 Entrance Stairs-Rails: None Home Layout: One level Home Equipment: Walker - 2 wheels;Cane - single point;Shower seat Prior Function Level of Independence: Independent with assistive device(s) (used rw in the mornings) Communication Communication: HOH Dominant Hand: Right    Cognition  Cognition Arousal/Alertness: Lethargic Behavior During Therapy: Flat affect Overall Cognitive Status: No family/caregiver present to determine baseline cognitive functioning    Extremity/Trunk Assessment Upper Extremity Assessment Upper Extremity Assessment: Defer to OT evaluation Lower Extremity Assessment Lower Extremity Assessment: Generalized weakness   Balance Balance Balance Assessed: Yes Static Sitting Balance Static Sitting - Balance Support: Feet supported Static Sitting - Level of Assistance: 7: Independent Static Standing Balance Static Standing - Balance Support: During functional activity Static Standing - Level of Assistance: 5: Stand by assistance Static Standing - Comment/# of Minutes: VCs for approaching countertop and positioning for safety during standing activities  End of Session PT - End of Session Equipment Utilized During Treatment: Gait belt;Back brace Activity Tolerance: Patient limited by fatigue;Other (comment) (soft BP) Patient left: in bed;with call bell/phone within reach;with bed alarm set Nurse Communication: Mobility status;Precautions  GP     Fabio Asa 12/09/2012, 3:18 PM Charlotte Crumb, PT DPT  [phone]

## 2012-12-09 NOTE — Progress Notes (Addendum)
Clinical Social Work Department CLINICAL SOCIAL WORK PLACEMENT NOTE 12/09/2012  Patient:  Chris Braun, Chris Braun  Account Number:  [id number] Admit date:  12/08/2012  Clinical Social Worker:  Robin Searing  Date/time:  12/09/2012 03:01 PM  Clinical Social Work is seeking post-discharge placement for this patient at the following level of care:   SKILLED NURSING   (*CSW will update this form in Epic as items are completed)   12/09/2012  Patient/family provided with [facility] Department of Clinical Social Work's list of facilities offering this level of care within the geographic area requested by the patient (or if unable, by the patient's family).  12/09/2012  Patient/family informed of their freedom to choose among providers that offer the needed level of care, that participate in Medicare, Medicaid or managed care program needed by the patient, have an available bed and are willing to accept the patient.  12/09/2012  Patient/family informed of [facility]' ownership interest in [facility], as well as of the fact that they are under no obligation to receive care at this facility.  PASARR submitted to EDS on 12/09/2012 PASARR number received from [employer] on 12/09/2012 - [id number]  FL2 transmitted to all facilities in geographic area requested by pt/family on  12/09/2012 FL2 transmitted to all facilities within larger geographic area on   Patient informed that his/her managed care company has contracts with or will negotiate with  certain facilities, including the following:     Patient/family informed of bed offers received: 12/10/12  Patient [facility] bed [facility] Physician recommends and patient chooses bed at    Patient to be transferred to [facility] in [place] on 12/11/12   Patient to be transferred to facility by ambulance  The following physician request were entered in Epic: Additional Comments: 12/10/13 - Ambulance authorization number  for 10/25 transport is [id number]. Alvina Chou, LCSW   Reece Levy, MSW, Theresia Majors [phone]

## 2012-12-10 MED ORDER — HYDROCODONE-ACETAMINOPHEN 5-325 MG PO TABS
1.0000 | ORAL_TABLET | Freq: Four times a day (QID) | ORAL | Status: DC | PRN
  Administered 2012-12-10 – 2012-12-11 (×3): 1 via ORAL
  Filled 2012-12-10 (×3): qty 1

## 2012-12-10 NOTE — Progress Notes (Signed)
Physical Therapy Treatment Patient Details Name: Chris Braun MRN: [id number] DOB: 11/17/1935 Today's Date: 12/10/2012 Time: 2130-8657 PT Time Calculation (min): 19 min  PT Assessment / Plan / Recommendation  History of Present Illness HPI: 77 year old gentleman presents with recurrent back pain having several months out from a laminectomy and discectomy.  Pt is now s/p POSTERIOR LUMBAR INTERBODY FUSION (PLIF) 2 LEVEL     PT Comments   Patient demonstrates improvements in ambulation today. Still reports some dizziness during activity. Will continue to see and progress activity as tolerated.  Follow Up Recommendations  SNF           Equipment Recommendations  None recommended by PT       Frequency Min 5X/week   Progress towards PT Goals Progress towards PT goals: Progressing toward goals  Plan Current plan remains appropriate    Precautions / Restrictions Precautions Precautions: Back;Fall Precaution Booklet Issued: Yes (comment) Required Braces or Orthoses: Spinal Brace Spinal Brace: Lumbar corset Restrictions Weight Bearing Restrictions: No   Pertinent Vitals/Pain 3/10    Mobility  Bed Mobility Bed Mobility: Rolling Right;Sit to Supine;Sit to Sidelying Left Rolling Right: 4: Min guard Sit to Supine: 4: Min guard Sit to Sidelying Left: 4: Min assist Transfers Transfers: Sit to Stand;Stand to Sit Sit to Stand: 4: Min guard;From bed Stand to Sit: 4: Min guard;To bed Ambulation/Gait Ambulation/Gait Assistance: 4: Min guard Ambulation Distance (Feet): 200 Feet Assistive device: Rolling walker Ambulation/Gait Assistance Details: VCs for upright postures with mobility, pt occassionally shuffling and flexing RLE during ambulation but could correct with cues Gait Pattern: Step-to pattern;Decreased stride length Gait velocity: decreased    Exercises     PT Diagnosis:    PT Problem List:   PT Treatment Interventions:     PT Goals (current goals can now be found in  the care plan section) Acute Rehab PT Goals Patient Stated Goal: to get stronger before he goes home PT Goal Formulation: With patient Time For Goal Achievement: 12/23/12 Potential to Achieve Goals: Good  Visit Information  Last PT Received On: 12/10/12 Assistance Needed: +1 History of Present Illness: HPI: 77 year old gentleman presents with recurrent back pain having several months out from a laminectomy and discectomy.  Pt is now s/p POSTERIOR LUMBAR INTERBODY FUSION (PLIF) 2 LEVEL      Subjective Data  Subjective: It doesnt hurt to walk Patient Stated Goal: to get stronger before he goes home   Cognition  Cognition Arousal/Alertness: Awake/alert Behavior During Therapy: Flat affect Overall Cognitive Status: No family/caregiver present to determine baseline cognitive functioning    Balance  Balance Balance Assessed: Yes Static Sitting Balance Static Sitting - Balance Support: Feet supported Static Sitting - Level of Assistance: 7: Independent Static Standing Balance Static Standing - Balance Support: During functional activity  End of Session PT - End of Session Equipment Utilized During Treatment: Gait belt;Back brace Activity Tolerance: Patient limited by fatigue Patient left: in bed;with call bell/phone within reach;with bed alarm set Nurse Communication: Mobility status;Precautions   GP     Fabio Asa 12/10/2012, 3:59 PM Charlotte Crumb, PT DPT  [phone]

## 2012-12-10 NOTE — Progress Notes (Signed)
Nutrition Brief Note  Patient identified on the Malnutrition Screening Tool (MST) Report for recent weight lost without trying and eating poorly because of a decreased appetite.  Per readings below, patient has had a 6% weight loss since January 2014 -- not significant for time frame.  Wt Readings from Last 15 Encounters:  12/08/12 159 lb 3 oz (72.207 kg)  12/08/12 159 lb 3 oz (72.207 kg)  12/03/12 159 lb 3 oz (72.207 kg)  03/12/12 170 lb (77.111 kg)  03/12/12 170 lb (77.111 kg)    Body mass index is 23.5 kg/(m^2). Patient meets criteria for Normal based on current BMI.   Current diet order is Heart Healthy, patient's average meal consumption is approximately 65% at this time. Labs and medications reviewed.   No nutrition interventions warranted at this time. If nutrition issues arise, please consult RD.   Maureen Chatters, RD, LDN Pager #: [phone] After-Hours Pager #: [phone]

## 2012-12-10 NOTE — Discharge Summary (Signed)
Physician Discharge Summary  Patient ID: Chris Braun MRN: [id number] DOB/AGE: [birth year] 77 y.o.  Admit date: 12/08/2012 Discharge date: 12/10/2012  Admission Diagnoses: Lumbar spinal stenosis L3-4  Discharge Diagnoses: Same Active Problems:   * No active hospital problems. *   Discharged Condition: Stable  Hospital Course:  Mrs. Chris Braun is a 77 y.o. male with a previous history of lumbar laminectomy who presented with progressive spinal stenosis at L3-4 and a large Schmorl's node at L4-5 with evidence of instability. The patient electively underwent L3-4 posterior lumbar interbody fusion with stabilization extended to L5. The procedure was performed without complication and the patient was returned to Gen. neuroscience floor. Postoperatively he made a largely uneventful recovery with continued work with physical therapy and occupational therapy. His pain was under good control, with reduction in his preoperative buttock and leg pain. The patient was seen by physical therapy who recommended continued therapy upon discharge. The patient stated that his wife is recently undergone open-heart surgery and is therefore unable to help significantly home. Physical therapy therefore recommended transfer to a skilled nursing facility.  Treatments: Surgery - L3-4 PLIF, L3-5 posterolateral fusion  Discharge Exam: Blood pressure 100/69, pulse 102, temperature 98.1 F (36.7 C), temperature source Oral, resp. rate 18, height 5\' 9"  (1.753 m), weight 72.207 kg (159 lb 3 oz), SpO2 98.00%. Awake, alert, oriented Speech fluent, appropriate CN grossly intact 5/5 BLE Wound c/d/i  Follow-up: Follow-up in Dr. Lonie Peak office [facility] [phone]) in 2-3 weeks  Disposition: Skilled Nursing Facility     Medication List    STOP taking these medications       aspirin EC 81 MG tablet      TAKE these medications       albuterol 108 (90 BASE) MCG/ACT inhaler   Commonly known as:  PROVENTIL HFA;VENTOLIN HFA  Inhale 2 puffs into the lungs every 6 (six) hours as needed for wheezing.     alendronate 70 MG tablet  Commonly known as:  FOSAMAX  - Take 70 mg by mouth every 7 (seven) days. Take with a full glass of water on an empty stomach.  - Takes on Friday.     ezetimibe-simvastatin 10-40 MG per tablet  Commonly known as:  VYTORIN  Take 1 tablet by mouth at bedtime.     ibandronate 150 MG tablet  Commonly known as:  BONIVA  - Take 150 mg by mouth every 30 (thirty) days. Take on 1st of the month  - Take in the morning with a full glass of water, on an empty stomach, and do not take anything else by mouth or lie down for the next 30 min.     metoprolol succinate 25 MG 24 hr tablet  Commonly known as:  TOPROL-XL  Take 25 mg by mouth 2 (two) times daily.     omeprazole 20 MG capsule  Commonly known as:  PRILOSEC  Take 20 mg by mouth daily.     oxyCODONE-acetaminophen 5-325 MG per tablet  Commonly known as:  PERCOCET/ROXICET  Take 1-2 tablets by mouth every 4 (four) hours as needed.     oxyCODONE-acetaminophen 10-325 MG per tablet  Commonly known as:  PERCOCET  Take 1 tablet by mouth every 4 (four) hours as needed.     ramipril 2.5 MG capsule  Commonly known as:  ALTACE  Take 2.5 mg by mouth daily.         SignedLisbeth Renshaw, C 12/10/2012, 5:16 PM

## 2012-12-10 NOTE — Progress Notes (Signed)
Subjective: Patient reports Overall is feeling okay but he is sleepy is not having any significant leg pain no new numbness or tingling back pain is very sore. It is very difficult to arouse he is voiding he is passing gas  Objective: Vital signs in last 24 hours: Temp:  [97.8 F (36.6 C)-98.7 F (37.1 C)] 98.2 F (36.8 C) (10/24 1009) Pulse Rate:  [69-95] 83 (10/24 1009) Resp:  [16-20] 18 (10/24 1009) BP: (85-125)/(39-65) 101/54 mmHg (10/24 1009) SpO2:  [94 %-98 %] 97 % (10/24 1009)  Intake/Output from previous day: 10/23 0701 - 10/24 0700 In: 723 [P.O.:720; I.V.:3] Out: 750 [Urine:600; Drains:150] Intake/Output this shift:    He is awake alert walked in the room and to questions properly strength is 5 out of 5 in his lower extremities his was clean and dry although his drainage become disconnected so I took out  Lab Results: No results found for this basename: WBC, HGB, HCT, PLT,  in the last 72 hours BMET No results found for this basename: NA, K, CL, CO2, GLUCOSE, BUN, CREATININE, CALCIUM,  in the last 72 hours  Studies/Results: Dg Lumbar Spine 2-3 Views  12/08/2012   CLINICAL DATA:  No given history of  EXAM: LUMBAR SPINE - 2-3 VIEW  COMPARISON:  MRI 10/27/2012.  FINDINGS: AP and lateral views show a decompression and fusion in progress from L3 through L5. Pedicle screws are in the process of being placed. There has been discectomy with interbody fusion material at L3-4. There is an old compression deformity of L5. Pedicle screws at that level are at the junction of the pedicles and vertebral body.  IMPRESSION: Decompression, diskectomy and fusion in progress from L3 through L5.   Electronically Signed   By: Paulina Fusi M.D.   On: 12/08/2012 13:45    Assessment/Plan: Safely resume as possibly to marked with I believe these oversedated from his medication since on the discontinuous posterolateral scale back his pain medication progressively mobilized encourage by mouth intake  and will see her progresses with physical and occupational therapy.  LOS: 2 days     Jalina Blowers P 12/10/2012, 10:51 AM

## 2012-12-10 NOTE — Clinical Social Work Note (Addendum)
Per handoff from unit CSW, pt daughter requested [facility], SNF.  CSW left message with Jasmine December at [facility] to determine if SNF was able to make bed offer.  CSW awaiting a return call.  Unit CSW submitted clinicals to [facility].  CSW contacted [facility] Medicare to determine if SNF authorization had been received.  Message left.    Vickii Penna, LCSWA [phone]  Clinical Social Work

## 2012-12-11 MED ORDER — OXYCODONE-ACETAMINOPHEN 10-325 MG PO TABS: 1.0000 | ORAL_TABLET | ORAL | Status: DC | PRN

## 2012-12-11 NOTE — Discharge Summary (Signed)
Physician Discharge Summary  Patient ID: Chris Braun MRN: [id number] DOB/AGE: 08-12-1935 77 y.o.  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 *   Discharged Condition: good  Hospital Course: Dr. Wynetta Emery performed at L3-4 and L4-5 decompression instrumentation and fusion on the patient on 12/08/2012.  The patient's postoperative course was unremarkable. Ar[facility]  Patient was given oral and written discharge instructions. All his questions were answered.  Consults:none Significant Diagnostic Studies:none Treatments:L3-4 and L4-5 decompression, instrumentation, and fusion. Discharge Exam: Blood pressure 104/74, pulse 129, temperature 98 F (36.7 C), temperature source Oral, resp. rate 20, height 5\' 9"  (1.753 m), weight 72.207 kg (159 lb 3 oz), SpO2 99.00%. The patient is alert and pleasant. His strength is grossly normal.  Disposition: skilled nursing facility  Discharge Orders   Future Orders Complete By Expires   Call MD for:  difficulty breathing, headache or visual disturbances  As directed    Call MD for:  extreme fatigue  As directed    Call MD for:  hives  As directed    Call MD for:  persistant dizziness or light-headedness  As directed    Call MD for:  persistant nausea and vomiting  As directed    Call MD for:  redness, tenderness, or signs of infection (pain, swelling, redness, odor or green/yellow discharge around incision site)  As directed    Call MD for:  severe uncontrolled pain  As directed    Call MD for:  temperature >100.4  As directed    Diet - low sodium heart healthy  As directed    Discharge instructions  As directed    Comments:     Call [phone] for a followup appointment. Take a stool  softener while you are using pain medications.   Driving Restrictions  As directed    Comments:     Do not drive for 2 weeks.   Increase activity slowly  As directed    Lifting restrictions  As directed    Comments:     Do not lift more than 5 pounds. No excessive bending or twisting.   May shower / Bathe  As directed    Comments:     He may shower after the pain she is removed 3 days after surgery. Leave the incision alone.   Remove dressing in 48 hours  As directed    Comments:     Your stitches are under the scan and will dissolve by themselves. The Steri-Strips will fall off after you take a few showers. Do not rub back or pick at the wound, Leave the wound alone.       Medication List    STOP taking these medications       aspirin EC 81 MG tablet      TAKE these medications       albuterol 108 (90 BASE) MCG/ACT inhaler  Commonly known as:  PROVENTIL HFA;VENTOLIN HFA  Inhale 2 puffs into the lungs every 6 (six) hours as needed for wheezing.     alendronate 70 MG tablet  Commonly known as:  FOSAMAX  - Take 70 mg by mouth every 7 (seven) days. Take with a full glass of water on an empty stomach.  - Takes on Friday.     ezetimibe-simvastatin 10-40 MG per  tablet  Commonly known as:  VYTORIN  Take 1 tablet by mouth at bedtime.     ibandronate 150 MG tablet  Commonly known as:  BONIVA  - Take 150 mg by mouth every 30 (thirty) days. Take on 1st of the month  - Take in the morning with a full glass of water, on an empty stomach, and do not take anything else by mouth or lie down for the next 30 min.     metoprolol succinate 25 MG 24 hr tablet  Commonly known as:  TOPROL-XL  Take 25 mg by mouth 2 (two) times daily.     omeprazole 20 MG capsule  Commonly known as:  PRILOSEC  Take 20 mg by mouth daily.     oxyCODONE-acetaminophen 5-325 MG per tablet  Commonly known as:  PERCOCET/ROXICET  Take 1-2 tablets by mouth every 4 (four) hours as needed.      oxyCODONE-acetaminophen 10-325 MG per tablet  Commonly known as:  PERCOCET  Take 1 tablet by mouth every 4 (four) hours as needed for pain.     oxyCODONE-acetaminophen 10-325 MG per tablet  Commonly known as:  PERCOCET  Take 1 tablet by mouth every 4 (four) hours as needed.     ramipril 2.5 MG capsule  Commonly known as:  ALTACE  Take 2.5 mg by mouth daily.         SignedCristi Loron 12/11/2012, 9:27 AM

## 2012-12-11 NOTE — Progress Notes (Signed)
Clinical Child psychotherapist (CSW) received call from charge nurse regarding patient D/C to [facility] in [place] today. CSW contacted admissions coordinator April Anderson who reported that they have everything ready for the patient to come today. CSW prepared D/C packet and gave it to the RN. Patient is being transported by his daughter to [place] to the facility. [name] is at bedside now. Nursing is aware of above. Please reconsult if further social work needs arise. CSW signing off.   Jetta Lout, LCSWA Weekend CSW [phone]

## 2012-12-12 NOTE — Progress Notes (Signed)
Clinical Social Work Department CLINICAL SOCIAL WORK PLACEMENT NOTE 12/12/2012  Patient:  Chris Braun, Chris Braun  Account Number:  [id number] Admit date:  12/08/2012  Clinical Social Worker:  Robin Searing  Date/time:  12/09/2012 03:01 PM  Clinical Social Work is seeking post-discharge placement for this patient at the following level of care:   SKILLED NURSING   (*CSW will update this form in Epic as items are completed)   12/09/2012  Patient/family provided with [facility] Department of Clinical Social Work's list of facilities offering this level of care within the geographic area requested by the patient (or if unable, by the patient's family).  12/09/2012  Patient/family informed of their freedom to choose among providers that offer the needed level of care, that participate in Medicare, Medicaid or managed care program needed by the patient, have an available bed and are willing to accept the patient.  12/09/2012  Patient/family informed of [facility]' ownership interest in [facility], as well as of the fact that they are under no obligation to receive care at this facility.  PASARR submitted to EDS on 12/09/2012 PASARR number received from EDS on 12/09/2012  FL2 transmitted to all facilities in geographic area requested by pt/family on  12/09/2012 FL2 transmitted to all facilities within larger geographic area on   Patient informed that his/her managed care company has contracts with or will negotiate with  certain facilities, including the following:     Patient/family informed of bed offers received:  12/09/2012 Patient chooses bed at [facility], [place] Physician recommends and patient chooses bed at    [facility] [place] on  12/09/2012 Patient to be transferred to facility by EMS  The following physician request were entered in Epic:   Additional Comments: Reece Levy, MSW,  Theresia Majors [phone]

## 2014-01-17 DIAGNOSIS — I639 Cerebral infarction, unspecified: Secondary | ICD-10-CM

## 2014-01-17 HISTORY — DX: Cerebral infarction, unspecified: I63.9

## 2014-02-17 HISTORY — PX: CATARACT EXTRACTION: SUR2

## 2014-08-24 DIAGNOSIS — E785 Hyperlipidemia, unspecified: Secondary | ICD-10-CM

## 2014-08-24 DIAGNOSIS — I2510 Atherosclerotic heart disease of native coronary artery without angina pectoris: Secondary | ICD-10-CM | POA: Insufficient documentation

## 2014-08-24 DIAGNOSIS — I6523 Occlusion and stenosis of bilateral carotid arteries: Secondary | ICD-10-CM

## 2014-08-24 HISTORY — DX: Atherosclerotic heart disease of native coronary artery without angina pectoris: I25.10

## 2014-08-24 HISTORY — DX: Hyperlipidemia, unspecified: E78.5

## 2014-08-24 HISTORY — DX: Occlusion and stenosis of bilateral carotid arteries: I65.23

## 2014-10-12 HISTORY — PX: HYDROCELE EXCISION: SHX482

## 2015-03-13 DIAGNOSIS — I252 Old myocardial infarction: Secondary | ICD-10-CM | POA: Diagnosis not present

## 2015-03-13 DIAGNOSIS — Z87891 Personal history of nicotine dependence: Secondary | ICD-10-CM | POA: Diagnosis not present

## 2015-03-13 DIAGNOSIS — I69951 Hemiplegia and hemiparesis following unspecified cerebrovascular disease affecting right dominant side: Secondary | ICD-10-CM | POA: Diagnosis not present

## 2015-03-13 DIAGNOSIS — H25812 Combined forms of age-related cataract, left eye: Secondary | ICD-10-CM | POA: Diagnosis not present

## 2015-03-13 DIAGNOSIS — F0390 Unspecified dementia without behavioral disturbance: Secondary | ICD-10-CM | POA: Diagnosis not present

## 2015-03-13 DIAGNOSIS — K219 Gastro-esophageal reflux disease without esophagitis: Secondary | ICD-10-CM | POA: Diagnosis not present

## 2015-03-13 DIAGNOSIS — I10 Essential (primary) hypertension: Secondary | ICD-10-CM | POA: Diagnosis not present

## 2015-03-13 DIAGNOSIS — J449 Chronic obstructive pulmonary disease, unspecified: Secondary | ICD-10-CM | POA: Diagnosis not present

## 2015-03-13 DIAGNOSIS — Z955 Presence of coronary angioplasty implant and graft: Secondary | ICD-10-CM | POA: Diagnosis not present

## 2015-03-13 DIAGNOSIS — Z79899 Other long term (current) drug therapy: Secondary | ICD-10-CM | POA: Diagnosis not present

## 2015-03-13 DIAGNOSIS — F329 Major depressive disorder, single episode, unspecified: Secondary | ICD-10-CM | POA: Diagnosis not present

## 2015-03-13 DIAGNOSIS — M810 Age-related osteoporosis without current pathological fracture: Secondary | ICD-10-CM | POA: Diagnosis not present

## 2015-03-13 DIAGNOSIS — E785 Hyperlipidemia, unspecified: Secondary | ICD-10-CM | POA: Diagnosis not present

## 2015-03-13 DIAGNOSIS — I2510 Atherosclerotic heart disease of native coronary artery without angina pectoris: Secondary | ICD-10-CM | POA: Diagnosis not present

## 2015-03-15 DIAGNOSIS — H348312 Tributary (branch) retinal vein occlusion, right eye, stable: Secondary | ICD-10-CM | POA: Diagnosis not present

## 2015-04-06 DIAGNOSIS — N401 Enlarged prostate with lower urinary tract symptoms: Secondary | ICD-10-CM | POA: Diagnosis not present

## 2015-04-13 DIAGNOSIS — F0280 Dementia in other diseases classified elsewhere without behavioral disturbance: Secondary | ICD-10-CM | POA: Diagnosis not present

## 2015-04-13 DIAGNOSIS — I6330 Cerebral infarction due to thrombosis of unspecified cerebral artery: Secondary | ICD-10-CM | POA: Diagnosis not present

## 2015-04-13 DIAGNOSIS — G301 Alzheimer's disease with late onset: Secondary | ICD-10-CM | POA: Diagnosis not present

## 2015-04-13 DIAGNOSIS — F329 Major depressive disorder, single episode, unspecified: Secondary | ICD-10-CM | POA: Diagnosis not present

## 2015-04-13 DIAGNOSIS — R269 Unspecified abnormalities of gait and mobility: Secondary | ICD-10-CM | POA: Diagnosis not present

## 2015-05-07 DIAGNOSIS — R6889 Other general symptoms and signs: Secondary | ICD-10-CM | POA: Diagnosis not present

## 2015-05-07 DIAGNOSIS — I10 Essential (primary) hypertension: Secondary | ICD-10-CM | POA: Diagnosis not present

## 2015-05-07 DIAGNOSIS — J189 Pneumonia, unspecified organism: Secondary | ICD-10-CM | POA: Diagnosis not present

## 2015-05-07 DIAGNOSIS — E663 Overweight: Secondary | ICD-10-CM | POA: Diagnosis not present

## 2015-05-07 DIAGNOSIS — J9801 Acute bronchospasm: Secondary | ICD-10-CM | POA: Diagnosis not present

## 2015-05-07 DIAGNOSIS — I2510 Atherosclerotic heart disease of native coronary artery without angina pectoris: Secondary | ICD-10-CM | POA: Diagnosis not present

## 2015-05-07 DIAGNOSIS — Z6826 Body mass index (BMI) 26.0-26.9, adult: Secondary | ICD-10-CM | POA: Diagnosis not present

## 2015-05-07 DIAGNOSIS — E784 Other hyperlipidemia: Secondary | ICD-10-CM | POA: Diagnosis not present

## 2015-05-17 DIAGNOSIS — H348312 Tributary (branch) retinal vein occlusion, right eye, stable: Secondary | ICD-10-CM | POA: Diagnosis not present

## 2015-05-28 DIAGNOSIS — F329 Major depressive disorder, single episode, unspecified: Secondary | ICD-10-CM | POA: Diagnosis not present

## 2015-05-28 DIAGNOSIS — Z9181 History of falling: Secondary | ICD-10-CM | POA: Insufficient documentation

## 2015-05-28 DIAGNOSIS — R269 Unspecified abnormalities of gait and mobility: Secondary | ICD-10-CM | POA: Diagnosis not present

## 2015-05-28 DIAGNOSIS — G301 Alzheimer's disease with late onset: Secondary | ICD-10-CM | POA: Diagnosis not present

## 2015-05-28 DIAGNOSIS — I6330 Cerebral infarction due to thrombosis of unspecified cerebral artery: Secondary | ICD-10-CM | POA: Diagnosis not present

## 2015-05-28 DIAGNOSIS — G603 Idiopathic progressive neuropathy: Secondary | ICD-10-CM | POA: Diagnosis not present

## 2015-05-28 DIAGNOSIS — M4806 Spinal stenosis, lumbar region: Secondary | ICD-10-CM | POA: Diagnosis not present

## 2015-05-28 DIAGNOSIS — F0280 Dementia in other diseases classified elsewhere without behavioral disturbance: Secondary | ICD-10-CM | POA: Diagnosis not present

## 2015-05-28 HISTORY — DX: History of falling: Z91.81

## 2015-05-29 DIAGNOSIS — S59901A Unspecified injury of right elbow, initial encounter: Secondary | ICD-10-CM | POA: Diagnosis not present

## 2015-05-29 DIAGNOSIS — S43004A Unspecified dislocation of right shoulder joint, initial encounter: Secondary | ICD-10-CM | POA: Diagnosis not present

## 2015-05-29 DIAGNOSIS — M25511 Pain in right shoulder: Secondary | ICD-10-CM | POA: Diagnosis not present

## 2015-05-29 DIAGNOSIS — M79601 Pain in right arm: Secondary | ICD-10-CM | POA: Diagnosis not present

## 2015-05-29 DIAGNOSIS — Y939 Activity, unspecified: Secondary | ICD-10-CM | POA: Diagnosis not present

## 2015-05-29 DIAGNOSIS — S43101A Unspecified dislocation of right acromioclavicular joint, initial encounter: Secondary | ICD-10-CM | POA: Diagnosis not present

## 2015-05-29 DIAGNOSIS — W010XXA Fall on same level from slipping, tripping and stumbling without subsequent striking against object, initial encounter: Secondary | ICD-10-CM | POA: Diagnosis not present

## 2015-05-29 DIAGNOSIS — S43014A Anterior dislocation of right humerus, initial encounter: Secondary | ICD-10-CM | POA: Diagnosis not present

## 2015-06-03 DIAGNOSIS — S20211A Contusion of right front wall of thorax, initial encounter: Secondary | ICD-10-CM | POA: Diagnosis not present

## 2015-06-03 DIAGNOSIS — I6523 Occlusion and stenosis of bilateral carotid arteries: Secondary | ICD-10-CM | POA: Diagnosis not present

## 2015-06-03 DIAGNOSIS — R911 Solitary pulmonary nodule: Secondary | ICD-10-CM | POA: Diagnosis not present

## 2015-06-03 DIAGNOSIS — R259 Unspecified abnormal involuntary movements: Secondary | ICD-10-CM | POA: Diagnosis not present

## 2015-06-03 DIAGNOSIS — R29898 Other symptoms and signs involving the musculoskeletal system: Secondary | ICD-10-CM | POA: Diagnosis not present

## 2015-06-03 DIAGNOSIS — S40011S Contusion of right shoulder, sequela: Secondary | ICD-10-CM | POA: Diagnosis not present

## 2015-06-03 DIAGNOSIS — R2681 Unsteadiness on feet: Secondary | ICD-10-CM | POA: Diagnosis not present

## 2015-06-03 DIAGNOSIS — G9009 Other idiopathic peripheral autonomic neuropathy: Secondary | ICD-10-CM | POA: Diagnosis not present

## 2015-06-03 DIAGNOSIS — R001 Bradycardia, unspecified: Secondary | ICD-10-CM | POA: Diagnosis not present

## 2015-06-03 DIAGNOSIS — Z8673 Personal history of transient ischemic attack (TIA), and cerebral infarction without residual deficits: Secondary | ICD-10-CM | POA: Diagnosis not present

## 2015-06-03 DIAGNOSIS — Z79899 Other long term (current) drug therapy: Secondary | ICD-10-CM | POA: Diagnosis not present

## 2015-06-03 DIAGNOSIS — R262 Difficulty in walking, not elsewhere classified: Secondary | ICD-10-CM | POA: Diagnosis not present

## 2015-06-03 DIAGNOSIS — W19XXXS Unspecified fall, sequela: Secondary | ICD-10-CM | POA: Diagnosis not present

## 2015-06-03 DIAGNOSIS — Z9181 History of falling: Secondary | ICD-10-CM | POA: Diagnosis not present

## 2015-06-03 DIAGNOSIS — I6789 Other cerebrovascular disease: Secondary | ICD-10-CM | POA: Diagnosis not present

## 2015-06-03 DIAGNOSIS — R296 Repeated falls: Secondary | ICD-10-CM | POA: Diagnosis not present

## 2015-06-03 DIAGNOSIS — I10 Essential (primary) hypertension: Secondary | ICD-10-CM | POA: Diagnosis not present

## 2015-06-03 DIAGNOSIS — J329 Chronic sinusitis, unspecified: Secondary | ICD-10-CM | POA: Diagnosis not present

## 2015-06-03 DIAGNOSIS — J432 Centrilobular emphysema: Secondary | ICD-10-CM | POA: Diagnosis not present

## 2015-06-03 DIAGNOSIS — I499 Cardiac arrhythmia, unspecified: Secondary | ICD-10-CM | POA: Diagnosis not present

## 2015-06-03 DIAGNOSIS — R531 Weakness: Secondary | ICD-10-CM | POA: Diagnosis not present

## 2015-06-03 DIAGNOSIS — S199XXA Unspecified injury of neck, initial encounter: Secondary | ICD-10-CM | POA: Diagnosis not present

## 2015-06-03 DIAGNOSIS — S0990XA Unspecified injury of head, initial encounter: Secondary | ICD-10-CM | POA: Diagnosis not present

## 2015-06-03 DIAGNOSIS — I2510 Atherosclerotic heart disease of native coronary artery without angina pectoris: Secondary | ICD-10-CM | POA: Diagnosis not present

## 2015-06-03 DIAGNOSIS — E785 Hyperlipidemia, unspecified: Secondary | ICD-10-CM | POA: Diagnosis not present

## 2015-06-03 HISTORY — DX: Repeated falls: R29.6

## 2015-06-03 HISTORY — DX: Other symptoms and signs involving the musculoskeletal system: R29.898

## 2015-06-04 DIAGNOSIS — R9431 Abnormal electrocardiogram [ECG] [EKG]: Secondary | ICD-10-CM | POA: Diagnosis not present

## 2015-06-04 DIAGNOSIS — R296 Repeated falls: Secondary | ICD-10-CM | POA: Diagnosis not present

## 2015-06-05 DIAGNOSIS — I6523 Occlusion and stenosis of bilateral carotid arteries: Secondary | ICD-10-CM | POA: Diagnosis not present

## 2015-06-05 DIAGNOSIS — I2510 Atherosclerotic heart disease of native coronary artery without angina pectoris: Secondary | ICD-10-CM | POA: Diagnosis not present

## 2015-06-05 DIAGNOSIS — I639 Cerebral infarction, unspecified: Secondary | ICD-10-CM | POA: Diagnosis not present

## 2015-06-05 DIAGNOSIS — R296 Repeated falls: Secondary | ICD-10-CM | POA: Diagnosis not present

## 2015-06-06 DIAGNOSIS — M25511 Pain in right shoulder: Secondary | ICD-10-CM | POA: Diagnosis not present

## 2015-06-06 DIAGNOSIS — Z8673 Personal history of transient ischemic attack (TIA), and cerebral infarction without residual deficits: Secondary | ICD-10-CM | POA: Diagnosis not present

## 2015-06-06 DIAGNOSIS — D531 Other megaloblastic anemias, not elsewhere classified: Secondary | ICD-10-CM | POA: Diagnosis not present

## 2015-06-06 DIAGNOSIS — M2550 Pain in unspecified joint: Secondary | ICD-10-CM | POA: Diagnosis not present

## 2015-06-06 DIAGNOSIS — F0390 Unspecified dementia without behavioral disturbance: Secondary | ICD-10-CM | POA: Diagnosis not present

## 2015-06-06 DIAGNOSIS — S43004D Unspecified dislocation of right shoulder joint, subsequent encounter: Secondary | ICD-10-CM | POA: Diagnosis not present

## 2015-06-06 DIAGNOSIS — R262 Difficulty in walking, not elsewhere classified: Secondary | ICD-10-CM | POA: Diagnosis not present

## 2015-06-06 DIAGNOSIS — M439 Deforming dorsopathy, unspecified: Secondary | ICD-10-CM | POA: Diagnosis not present

## 2015-06-06 DIAGNOSIS — S42014D Posterior displaced fracture of sternal end of right clavicle, subsequent encounter for fracture with routine healing: Secondary | ICD-10-CM | POA: Diagnosis not present

## 2015-06-06 DIAGNOSIS — R001 Bradycardia, unspecified: Secondary | ICD-10-CM | POA: Diagnosis not present

## 2015-06-06 DIAGNOSIS — Z8679 Personal history of other diseases of the circulatory system: Secondary | ICD-10-CM | POA: Diagnosis not present

## 2015-06-06 DIAGNOSIS — R296 Repeated falls: Secondary | ICD-10-CM | POA: Diagnosis not present

## 2015-06-08 DIAGNOSIS — M25511 Pain in right shoulder: Secondary | ICD-10-CM | POA: Diagnosis not present

## 2015-06-08 DIAGNOSIS — S42014D Posterior displaced fracture of sternal end of right clavicle, subsequent encounter for fracture with routine healing: Secondary | ICD-10-CM | POA: Diagnosis not present

## 2015-06-12 DIAGNOSIS — F0390 Unspecified dementia without behavioral disturbance: Secondary | ICD-10-CM | POA: Diagnosis not present

## 2015-06-12 DIAGNOSIS — R262 Difficulty in walking, not elsewhere classified: Secondary | ICD-10-CM | POA: Diagnosis not present

## 2015-06-12 DIAGNOSIS — M439 Deforming dorsopathy, unspecified: Secondary | ICD-10-CM | POA: Diagnosis not present

## 2015-06-12 DIAGNOSIS — M2550 Pain in unspecified joint: Secondary | ICD-10-CM | POA: Diagnosis not present

## 2015-06-13 DIAGNOSIS — G603 Idiopathic progressive neuropathy: Secondary | ICD-10-CM | POA: Diagnosis not present

## 2015-06-13 DIAGNOSIS — S46011A Strain of muscle(s) and tendon(s) of the rotator cuff of right shoulder, initial encounter: Secondary | ICD-10-CM | POA: Diagnosis not present

## 2015-06-13 DIAGNOSIS — S43014A Anterior dislocation of right humerus, initial encounter: Secondary | ICD-10-CM | POA: Diagnosis not present

## 2015-06-13 DIAGNOSIS — S43004D Unspecified dislocation of right shoulder joint, subsequent encounter: Secondary | ICD-10-CM | POA: Diagnosis not present

## 2015-06-13 DIAGNOSIS — Z8679 Personal history of other diseases of the circulatory system: Secondary | ICD-10-CM | POA: Diagnosis not present

## 2015-06-13 DIAGNOSIS — Z8673 Personal history of transient ischemic attack (TIA), and cerebral infarction without residual deficits: Secondary | ICD-10-CM | POA: Diagnosis not present

## 2015-06-13 DIAGNOSIS — F0280 Dementia in other diseases classified elsewhere without behavioral disturbance: Secondary | ICD-10-CM | POA: Diagnosis not present

## 2015-06-13 DIAGNOSIS — I679 Cerebrovascular disease, unspecified: Secondary | ICD-10-CM | POA: Diagnosis not present

## 2015-06-13 DIAGNOSIS — W19XXXA Unspecified fall, initial encounter: Secondary | ICD-10-CM | POA: Diagnosis not present

## 2015-06-13 DIAGNOSIS — X58XXXD Exposure to other specified factors, subsequent encounter: Secondary | ICD-10-CM | POA: Diagnosis not present

## 2015-06-13 DIAGNOSIS — M25511 Pain in right shoulder: Secondary | ICD-10-CM | POA: Diagnosis not present

## 2015-06-13 DIAGNOSIS — G301 Alzheimer's disease with late onset: Secondary | ICD-10-CM | POA: Diagnosis not present

## 2015-06-13 DIAGNOSIS — R296 Repeated falls: Secondary | ICD-10-CM | POA: Diagnosis not present

## 2015-06-13 DIAGNOSIS — M7521 Bicipital tendinitis, right shoulder: Secondary | ICD-10-CM | POA: Diagnosis not present

## 2015-06-13 DIAGNOSIS — R269 Unspecified abnormalities of gait and mobility: Secondary | ICD-10-CM | POA: Diagnosis not present

## 2015-06-13 DIAGNOSIS — S43014D Anterior dislocation of right humerus, subsequent encounter: Secondary | ICD-10-CM | POA: Diagnosis not present

## 2015-06-13 DIAGNOSIS — R531 Weakness: Secondary | ICD-10-CM | POA: Diagnosis not present

## 2015-06-13 DIAGNOSIS — R001 Bradycardia, unspecified: Secondary | ICD-10-CM | POA: Diagnosis not present

## 2015-06-13 DIAGNOSIS — M4806 Spinal stenosis, lumbar region: Secondary | ICD-10-CM | POA: Diagnosis not present

## 2015-06-18 DIAGNOSIS — M25511 Pain in right shoulder: Secondary | ICD-10-CM | POA: Diagnosis not present

## 2015-06-18 DIAGNOSIS — W19XXXA Unspecified fall, initial encounter: Secondary | ICD-10-CM | POA: Diagnosis not present

## 2015-06-18 DIAGNOSIS — S43014A Anterior dislocation of right humerus, initial encounter: Secondary | ICD-10-CM | POA: Diagnosis not present

## 2015-06-18 DIAGNOSIS — G301 Alzheimer's disease with late onset: Secondary | ICD-10-CM | POA: Diagnosis not present

## 2015-06-18 DIAGNOSIS — M7521 Bicipital tendinitis, right shoulder: Secondary | ICD-10-CM | POA: Diagnosis not present

## 2015-06-18 DIAGNOSIS — S46011A Strain of muscle(s) and tendon(s) of the rotator cuff of right shoulder, initial encounter: Secondary | ICD-10-CM | POA: Diagnosis not present

## 2015-06-18 DIAGNOSIS — G603 Idiopathic progressive neuropathy: Secondary | ICD-10-CM | POA: Diagnosis not present

## 2015-06-18 DIAGNOSIS — F0280 Dementia in other diseases classified elsewhere without behavioral disturbance: Secondary | ICD-10-CM | POA: Diagnosis not present

## 2015-06-18 DIAGNOSIS — R269 Unspecified abnormalities of gait and mobility: Secondary | ICD-10-CM | POA: Diagnosis not present

## 2015-06-18 DIAGNOSIS — I679 Cerebrovascular disease, unspecified: Secondary | ICD-10-CM | POA: Diagnosis not present

## 2015-06-18 DIAGNOSIS — X58XXXD Exposure to other specified factors, subsequent encounter: Secondary | ICD-10-CM | POA: Diagnosis not present

## 2015-06-18 DIAGNOSIS — S43014D Anterior dislocation of right humerus, subsequent encounter: Secondary | ICD-10-CM | POA: Diagnosis not present

## 2015-06-18 DIAGNOSIS — M4806 Spinal stenosis, lumbar region: Secondary | ICD-10-CM | POA: Diagnosis not present

## 2015-06-22 DIAGNOSIS — M25511 Pain in right shoulder: Secondary | ICD-10-CM | POA: Diagnosis not present

## 2015-06-22 DIAGNOSIS — M12811 Other specific arthropathies, not elsewhere classified, right shoulder: Secondary | ICD-10-CM | POA: Diagnosis not present

## 2015-06-25 DIAGNOSIS — F0390 Unspecified dementia without behavioral disturbance: Secondary | ICD-10-CM | POA: Diagnosis not present

## 2015-06-25 DIAGNOSIS — E538 Deficiency of other specified B group vitamins: Secondary | ICD-10-CM | POA: Diagnosis not present

## 2015-06-25 DIAGNOSIS — G2581 Restless legs syndrome: Secondary | ICD-10-CM | POA: Diagnosis not present

## 2015-06-25 DIAGNOSIS — J301 Allergic rhinitis due to pollen: Secondary | ICD-10-CM | POA: Diagnosis not present

## 2015-06-25 DIAGNOSIS — R29898 Other symptoms and signs involving the musculoskeletal system: Secondary | ICD-10-CM | POA: Diagnosis not present

## 2015-06-25 DIAGNOSIS — I639 Cerebral infarction, unspecified: Secondary | ICD-10-CM | POA: Diagnosis not present

## 2015-06-25 DIAGNOSIS — R5382 Chronic fatigue, unspecified: Secondary | ICD-10-CM | POA: Diagnosis not present

## 2015-06-25 DIAGNOSIS — I2510 Atherosclerotic heart disease of native coronary artery without angina pectoris: Secondary | ICD-10-CM | POA: Diagnosis not present

## 2015-06-25 DIAGNOSIS — G603 Idiopathic progressive neuropathy: Secondary | ICD-10-CM | POA: Diagnosis not present

## 2015-06-25 DIAGNOSIS — J449 Chronic obstructive pulmonary disease, unspecified: Secondary | ICD-10-CM | POA: Diagnosis not present

## 2015-06-25 DIAGNOSIS — E784 Other hyperlipidemia: Secondary | ICD-10-CM | POA: Diagnosis not present

## 2015-06-25 DIAGNOSIS — K210 Gastro-esophageal reflux disease with esophagitis: Secondary | ICD-10-CM | POA: Diagnosis not present

## 2015-06-25 DIAGNOSIS — M818 Other osteoporosis without current pathological fracture: Secondary | ICD-10-CM | POA: Diagnosis not present

## 2015-06-26 DIAGNOSIS — R269 Unspecified abnormalities of gait and mobility: Secondary | ICD-10-CM | POA: Diagnosis not present

## 2015-06-26 DIAGNOSIS — R296 Repeated falls: Secondary | ICD-10-CM | POA: Diagnosis not present

## 2015-06-26 DIAGNOSIS — Z7902 Long term (current) use of antithrombotics/antiplatelets: Secondary | ICD-10-CM | POA: Diagnosis not present

## 2015-06-26 DIAGNOSIS — M6281 Muscle weakness (generalized): Secondary | ICD-10-CM | POA: Diagnosis not present

## 2015-06-26 DIAGNOSIS — R001 Bradycardia, unspecified: Secondary | ICD-10-CM | POA: Diagnosis not present

## 2015-06-26 DIAGNOSIS — Z9181 History of falling: Secondary | ICD-10-CM | POA: Diagnosis not present

## 2015-06-26 DIAGNOSIS — S43004D Unspecified dislocation of right shoulder joint, subsequent encounter: Secondary | ICD-10-CM | POA: Diagnosis not present

## 2015-06-26 DIAGNOSIS — I2510 Atherosclerotic heart disease of native coronary artery without angina pectoris: Secondary | ICD-10-CM | POA: Diagnosis not present

## 2015-07-03 DIAGNOSIS — G603 Idiopathic progressive neuropathy: Secondary | ICD-10-CM | POA: Diagnosis not present

## 2015-07-03 DIAGNOSIS — I639 Cerebral infarction, unspecified: Secondary | ICD-10-CM | POA: Diagnosis not present

## 2015-07-03 DIAGNOSIS — M818 Other osteoporosis without current pathological fracture: Secondary | ICD-10-CM | POA: Diagnosis not present

## 2015-07-03 DIAGNOSIS — K210 Gastro-esophageal reflux disease with esophagitis: Secondary | ICD-10-CM | POA: Diagnosis not present

## 2015-07-03 DIAGNOSIS — R262 Difficulty in walking, not elsewhere classified: Secondary | ICD-10-CM | POA: Diagnosis not present

## 2015-07-03 DIAGNOSIS — I2510 Atherosclerotic heart disease of native coronary artery without angina pectoris: Secondary | ICD-10-CM | POA: Diagnosis not present

## 2015-07-03 DIAGNOSIS — G2581 Restless legs syndrome: Secondary | ICD-10-CM | POA: Diagnosis not present

## 2015-07-03 DIAGNOSIS — I998 Other disorder of circulatory system: Secondary | ICD-10-CM | POA: Diagnosis not present

## 2015-07-03 DIAGNOSIS — E784 Other hyperlipidemia: Secondary | ICD-10-CM | POA: Diagnosis not present

## 2015-07-03 DIAGNOSIS — J301 Allergic rhinitis due to pollen: Secondary | ICD-10-CM | POA: Diagnosis not present

## 2015-07-03 DIAGNOSIS — F0390 Unspecified dementia without behavioral disturbance: Secondary | ICD-10-CM | POA: Diagnosis not present

## 2015-07-03 DIAGNOSIS — J449 Chronic obstructive pulmonary disease, unspecified: Secondary | ICD-10-CM | POA: Diagnosis not present

## 2015-07-09 DIAGNOSIS — I6523 Occlusion and stenosis of bilateral carotid arteries: Secondary | ICD-10-CM | POA: Diagnosis not present

## 2015-07-09 DIAGNOSIS — R296 Repeated falls: Secondary | ICD-10-CM | POA: Diagnosis not present

## 2015-07-09 DIAGNOSIS — I2510 Atherosclerotic heart disease of native coronary artery without angina pectoris: Secondary | ICD-10-CM | POA: Diagnosis not present

## 2015-07-09 DIAGNOSIS — E785 Hyperlipidemia, unspecified: Secondary | ICD-10-CM | POA: Diagnosis not present

## 2015-07-12 DIAGNOSIS — R262 Difficulty in walking, not elsewhere classified: Secondary | ICD-10-CM | POA: Diagnosis not present

## 2015-07-18 DIAGNOSIS — J189 Pneumonia, unspecified organism: Secondary | ICD-10-CM | POA: Diagnosis not present

## 2015-07-18 DIAGNOSIS — Z9181 History of falling: Secondary | ICD-10-CM | POA: Diagnosis not present

## 2015-07-18 DIAGNOSIS — J449 Chronic obstructive pulmonary disease, unspecified: Secondary | ICD-10-CM | POA: Diagnosis not present

## 2015-07-18 DIAGNOSIS — M75101 Unspecified rotator cuff tear or rupture of right shoulder, not specified as traumatic: Secondary | ICD-10-CM | POA: Diagnosis not present

## 2015-07-18 DIAGNOSIS — R0989 Other specified symptoms and signs involving the circulatory and respiratory systems: Secondary | ICD-10-CM | POA: Diagnosis not present

## 2015-07-19 DIAGNOSIS — M4854XA Collapsed vertebra, not elsewhere classified, thoracic region, initial encounter for fracture: Secondary | ICD-10-CM | POA: Diagnosis not present

## 2015-07-19 DIAGNOSIS — J849 Interstitial pulmonary disease, unspecified: Secondary | ICD-10-CM | POA: Diagnosis not present

## 2015-07-19 DIAGNOSIS — R0989 Other specified symptoms and signs involving the circulatory and respiratory systems: Secondary | ICD-10-CM | POA: Diagnosis not present

## 2015-07-30 DIAGNOSIS — I639 Cerebral infarction, unspecified: Secondary | ICD-10-CM | POA: Diagnosis not present

## 2015-07-30 DIAGNOSIS — Z9181 History of falling: Secondary | ICD-10-CM | POA: Diagnosis not present

## 2015-07-30 DIAGNOSIS — I679 Cerebrovascular disease, unspecified: Secondary | ICD-10-CM | POA: Diagnosis not present

## 2015-07-30 DIAGNOSIS — R269 Unspecified abnormalities of gait and mobility: Secondary | ICD-10-CM | POA: Diagnosis not present

## 2015-07-30 DIAGNOSIS — M4806 Spinal stenosis, lumbar region: Secondary | ICD-10-CM | POA: Diagnosis not present

## 2015-07-30 DIAGNOSIS — G603 Idiopathic progressive neuropathy: Secondary | ICD-10-CM | POA: Diagnosis not present

## 2015-07-30 DIAGNOSIS — I6330 Cerebral infarction due to thrombosis of unspecified cerebral artery: Secondary | ICD-10-CM | POA: Diagnosis not present

## 2015-08-06 DIAGNOSIS — M12811 Other specific arthropathies, not elsewhere classified, right shoulder: Secondary | ICD-10-CM | POA: Diagnosis not present

## 2015-08-06 DIAGNOSIS — M25511 Pain in right shoulder: Secondary | ICD-10-CM | POA: Diagnosis not present

## 2015-08-12 DIAGNOSIS — R262 Difficulty in walking, not elsewhere classified: Secondary | ICD-10-CM | POA: Diagnosis not present

## 2015-08-16 DIAGNOSIS — H2511 Age-related nuclear cataract, right eye: Secondary | ICD-10-CM | POA: Diagnosis not present

## 2015-08-16 DIAGNOSIS — H348312 Tributary (branch) retinal vein occlusion, right eye, stable: Secondary | ICD-10-CM | POA: Diagnosis not present

## 2015-08-17 DIAGNOSIS — E784 Other hyperlipidemia: Secondary | ICD-10-CM | POA: Diagnosis not present

## 2015-08-17 DIAGNOSIS — K210 Gastro-esophageal reflux disease with esophagitis: Secondary | ICD-10-CM | POA: Diagnosis not present

## 2015-08-17 DIAGNOSIS — E538 Deficiency of other specified B group vitamins: Secondary | ICD-10-CM | POA: Diagnosis not present

## 2015-08-17 DIAGNOSIS — I639 Cerebral infarction, unspecified: Secondary | ICD-10-CM | POA: Diagnosis not present

## 2015-08-17 DIAGNOSIS — J449 Chronic obstructive pulmonary disease, unspecified: Secondary | ICD-10-CM | POA: Diagnosis not present

## 2015-08-17 DIAGNOSIS — G2581 Restless legs syndrome: Secondary | ICD-10-CM | POA: Diagnosis not present

## 2015-08-17 DIAGNOSIS — I2510 Atherosclerotic heart disease of native coronary artery without angina pectoris: Secondary | ICD-10-CM | POA: Diagnosis not present

## 2015-08-17 DIAGNOSIS — F0390 Unspecified dementia without behavioral disturbance: Secondary | ICD-10-CM | POA: Diagnosis not present

## 2015-08-17 DIAGNOSIS — J189 Pneumonia, unspecified organism: Secondary | ICD-10-CM | POA: Diagnosis not present

## 2015-08-17 DIAGNOSIS — I10 Essential (primary) hypertension: Secondary | ICD-10-CM | POA: Diagnosis not present

## 2015-08-17 DIAGNOSIS — Z6824 Body mass index (BMI) 24.0-24.9, adult: Secondary | ICD-10-CM | POA: Diagnosis not present

## 2015-08-17 DIAGNOSIS — M818 Other osteoporosis without current pathological fracture: Secondary | ICD-10-CM | POA: Diagnosis not present

## 2015-09-10 DIAGNOSIS — J329 Chronic sinusitis, unspecified: Secondary | ICD-10-CM | POA: Diagnosis not present

## 2015-09-10 DIAGNOSIS — J449 Chronic obstructive pulmonary disease, unspecified: Secondary | ICD-10-CM | POA: Diagnosis not present

## 2015-09-10 DIAGNOSIS — J301 Allergic rhinitis due to pollen: Secondary | ICD-10-CM | POA: Diagnosis not present

## 2015-09-11 DIAGNOSIS — R262 Difficulty in walking, not elsewhere classified: Secondary | ICD-10-CM | POA: Diagnosis not present

## 2015-10-04 DIAGNOSIS — J449 Chronic obstructive pulmonary disease, unspecified: Secondary | ICD-10-CM | POA: Diagnosis not present

## 2015-10-04 DIAGNOSIS — Z6824 Body mass index (BMI) 24.0-24.9, adult: Secondary | ICD-10-CM | POA: Diagnosis not present

## 2015-10-04 DIAGNOSIS — D649 Anemia, unspecified: Secondary | ICD-10-CM | POA: Diagnosis not present

## 2015-10-04 DIAGNOSIS — R05 Cough: Secondary | ICD-10-CM | POA: Diagnosis not present

## 2015-10-04 DIAGNOSIS — J329 Chronic sinusitis, unspecified: Secondary | ICD-10-CM | POA: Diagnosis not present

## 2015-10-04 DIAGNOSIS — J301 Allergic rhinitis due to pollen: Secondary | ICD-10-CM | POA: Diagnosis not present

## 2015-10-08 DIAGNOSIS — M26629 Arthralgia of temporomandibular joint, unspecified side: Secondary | ICD-10-CM | POA: Diagnosis not present

## 2015-10-08 DIAGNOSIS — K0889 Other specified disorders of teeth and supporting structures: Secondary | ICD-10-CM | POA: Diagnosis not present

## 2015-10-08 DIAGNOSIS — I639 Cerebral infarction, unspecified: Secondary | ICD-10-CM | POA: Diagnosis not present

## 2015-10-08 DIAGNOSIS — E784 Other hyperlipidemia: Secondary | ICD-10-CM | POA: Diagnosis not present

## 2015-10-08 DIAGNOSIS — K449 Diaphragmatic hernia without obstruction or gangrene: Secondary | ICD-10-CM | POA: Diagnosis not present

## 2015-10-08 DIAGNOSIS — J449 Chronic obstructive pulmonary disease, unspecified: Secondary | ICD-10-CM | POA: Diagnosis not present

## 2015-10-08 DIAGNOSIS — J329 Chronic sinusitis, unspecified: Secondary | ICD-10-CM | POA: Diagnosis not present

## 2015-10-09 DIAGNOSIS — G501 Atypical facial pain: Secondary | ICD-10-CM | POA: Diagnosis not present

## 2015-10-09 DIAGNOSIS — K219 Gastro-esophageal reflux disease without esophagitis: Secondary | ICD-10-CM | POA: Diagnosis not present

## 2015-10-09 DIAGNOSIS — B370 Candidal stomatitis: Secondary | ICD-10-CM | POA: Diagnosis not present

## 2015-10-10 DIAGNOSIS — R5383 Other fatigue: Secondary | ICD-10-CM | POA: Diagnosis not present

## 2015-10-10 DIAGNOSIS — J301 Allergic rhinitis due to pollen: Secondary | ICD-10-CM | POA: Diagnosis not present

## 2015-10-10 DIAGNOSIS — E559 Vitamin D deficiency, unspecified: Secondary | ICD-10-CM | POA: Diagnosis not present

## 2015-10-10 DIAGNOSIS — J8410 Pulmonary fibrosis, unspecified: Secondary | ICD-10-CM | POA: Diagnosis not present

## 2015-10-10 DIAGNOSIS — R918 Other nonspecific abnormal finding of lung field: Secondary | ICD-10-CM | POA: Diagnosis not present

## 2015-10-10 DIAGNOSIS — B370 Candidal stomatitis: Secondary | ICD-10-CM | POA: Diagnosis not present

## 2015-10-10 DIAGNOSIS — J4520 Mild intermittent asthma, uncomplicated: Secondary | ICD-10-CM | POA: Diagnosis not present

## 2015-10-12 DIAGNOSIS — R262 Difficulty in walking, not elsewhere classified: Secondary | ICD-10-CM | POA: Diagnosis not present

## 2015-10-15 DIAGNOSIS — J4520 Mild intermittent asthma, uncomplicated: Secondary | ICD-10-CM | POA: Diagnosis not present

## 2015-10-17 DIAGNOSIS — R5383 Other fatigue: Secondary | ICD-10-CM | POA: Diagnosis not present

## 2015-10-17 DIAGNOSIS — R918 Other nonspecific abnormal finding of lung field: Secondary | ICD-10-CM | POA: Diagnosis not present

## 2015-10-17 DIAGNOSIS — J301 Allergic rhinitis due to pollen: Secondary | ICD-10-CM | POA: Diagnosis not present

## 2015-10-17 DIAGNOSIS — J4530 Mild persistent asthma, uncomplicated: Secondary | ICD-10-CM | POA: Diagnosis not present

## 2015-10-24 DIAGNOSIS — G501 Atypical facial pain: Secondary | ICD-10-CM | POA: Diagnosis not present

## 2015-10-24 DIAGNOSIS — J328 Other chronic sinusitis: Secondary | ICD-10-CM | POA: Diagnosis not present

## 2015-10-24 DIAGNOSIS — J449 Chronic obstructive pulmonary disease, unspecified: Secondary | ICD-10-CM | POA: Diagnosis not present

## 2015-10-24 DIAGNOSIS — R1310 Dysphagia, unspecified: Secondary | ICD-10-CM | POA: Diagnosis not present

## 2015-10-31 DIAGNOSIS — R918 Other nonspecific abnormal finding of lung field: Secondary | ICD-10-CM | POA: Diagnosis not present

## 2015-10-31 DIAGNOSIS — R911 Solitary pulmonary nodule: Secondary | ICD-10-CM | POA: Diagnosis not present

## 2015-11-02 DIAGNOSIS — K219 Gastro-esophageal reflux disease without esophagitis: Secondary | ICD-10-CM | POA: Diagnosis not present

## 2015-11-02 DIAGNOSIS — J019 Acute sinusitis, unspecified: Secondary | ICD-10-CM | POA: Diagnosis not present

## 2015-11-02 DIAGNOSIS — I7090 Unspecified atherosclerosis: Secondary | ICD-10-CM | POA: Diagnosis not present

## 2015-11-02 DIAGNOSIS — K047 Periapical abscess without sinus: Secondary | ICD-10-CM | POA: Diagnosis not present

## 2015-11-02 DIAGNOSIS — R51 Headache: Secondary | ICD-10-CM | POA: Diagnosis not present

## 2015-11-02 DIAGNOSIS — B370 Candidal stomatitis: Secondary | ICD-10-CM | POA: Diagnosis not present

## 2015-11-02 DIAGNOSIS — J449 Chronic obstructive pulmonary disease, unspecified: Secondary | ICD-10-CM | POA: Diagnosis not present

## 2015-11-05 DIAGNOSIS — G301 Alzheimer's disease with late onset: Secondary | ICD-10-CM | POA: Diagnosis not present

## 2015-11-05 DIAGNOSIS — R269 Unspecified abnormalities of gait and mobility: Secondary | ICD-10-CM | POA: Diagnosis not present

## 2015-11-05 DIAGNOSIS — Z9181 History of falling: Secondary | ICD-10-CM | POA: Diagnosis not present

## 2015-11-05 DIAGNOSIS — I679 Cerebrovascular disease, unspecified: Secondary | ICD-10-CM | POA: Diagnosis not present

## 2015-11-05 DIAGNOSIS — I639 Cerebral infarction, unspecified: Secondary | ICD-10-CM | POA: Diagnosis not present

## 2015-11-05 DIAGNOSIS — F0280 Dementia in other diseases classified elsewhere without behavioral disturbance: Secondary | ICD-10-CM | POA: Diagnosis not present

## 2015-11-05 DIAGNOSIS — G603 Idiopathic progressive neuropathy: Secondary | ICD-10-CM | POA: Diagnosis not present

## 2015-11-05 DIAGNOSIS — M4806 Spinal stenosis, lumbar region: Secondary | ICD-10-CM | POA: Diagnosis not present

## 2015-11-06 DIAGNOSIS — I2510 Atherosclerotic heart disease of native coronary artery without angina pectoris: Secondary | ICD-10-CM | POA: Diagnosis not present

## 2015-11-06 DIAGNOSIS — J329 Chronic sinusitis, unspecified: Secondary | ICD-10-CM | POA: Diagnosis not present

## 2015-11-06 DIAGNOSIS — D649 Anemia, unspecified: Secondary | ICD-10-CM | POA: Diagnosis not present

## 2015-11-06 DIAGNOSIS — G2581 Restless legs syndrome: Secondary | ICD-10-CM | POA: Diagnosis not present

## 2015-11-06 DIAGNOSIS — K210 Gastro-esophageal reflux disease with esophagitis: Secondary | ICD-10-CM | POA: Diagnosis not present

## 2015-11-06 DIAGNOSIS — F0390 Unspecified dementia without behavioral disturbance: Secondary | ICD-10-CM | POA: Diagnosis not present

## 2015-11-06 DIAGNOSIS — M818 Other osteoporosis without current pathological fracture: Secondary | ICD-10-CM | POA: Diagnosis not present

## 2015-11-06 DIAGNOSIS — E784 Other hyperlipidemia: Secondary | ICD-10-CM | POA: Diagnosis not present

## 2015-11-06 DIAGNOSIS — I639 Cerebral infarction, unspecified: Secondary | ICD-10-CM | POA: Diagnosis not present

## 2015-11-06 DIAGNOSIS — J45909 Unspecified asthma, uncomplicated: Secondary | ICD-10-CM | POA: Diagnosis not present

## 2015-11-06 DIAGNOSIS — Z23 Encounter for immunization: Secondary | ICD-10-CM | POA: Diagnosis not present

## 2015-11-12 DIAGNOSIS — R262 Difficulty in walking, not elsewhere classified: Secondary | ICD-10-CM | POA: Diagnosis not present

## 2015-11-14 DIAGNOSIS — J4520 Mild intermittent asthma, uncomplicated: Secondary | ICD-10-CM | POA: Diagnosis not present

## 2015-11-14 DIAGNOSIS — R5383 Other fatigue: Secondary | ICD-10-CM | POA: Diagnosis not present

## 2015-11-14 DIAGNOSIS — R918 Other nonspecific abnormal finding of lung field: Secondary | ICD-10-CM | POA: Diagnosis not present

## 2015-11-14 DIAGNOSIS — J301 Allergic rhinitis due to pollen: Secondary | ICD-10-CM | POA: Diagnosis not present

## 2015-11-26 DIAGNOSIS — K219 Gastro-esophageal reflux disease without esophagitis: Secondary | ICD-10-CM | POA: Diagnosis not present

## 2015-11-26 DIAGNOSIS — J019 Acute sinusitis, unspecified: Secondary | ICD-10-CM | POA: Diagnosis not present

## 2015-12-11 DIAGNOSIS — D649 Anemia, unspecified: Secondary | ICD-10-CM | POA: Diagnosis not present

## 2015-12-11 DIAGNOSIS — Z0000 Encounter for general adult medical examination without abnormal findings: Secondary | ICD-10-CM | POA: Diagnosis not present

## 2015-12-11 DIAGNOSIS — G2581 Restless legs syndrome: Secondary | ICD-10-CM | POA: Diagnosis not present

## 2015-12-11 DIAGNOSIS — E538 Deficiency of other specified B group vitamins: Secondary | ICD-10-CM | POA: Diagnosis not present

## 2015-12-11 DIAGNOSIS — F0390 Unspecified dementia without behavioral disturbance: Secondary | ICD-10-CM | POA: Diagnosis not present

## 2015-12-11 DIAGNOSIS — J449 Chronic obstructive pulmonary disease, unspecified: Secondary | ICD-10-CM | POA: Diagnosis not present

## 2015-12-11 DIAGNOSIS — T148XXA Other injury of unspecified body region, initial encounter: Secondary | ICD-10-CM | POA: Diagnosis not present

## 2015-12-11 DIAGNOSIS — K210 Gastro-esophageal reflux disease with esophagitis: Secondary | ICD-10-CM | POA: Diagnosis not present

## 2015-12-11 DIAGNOSIS — I639 Cerebral infarction, unspecified: Secondary | ICD-10-CM | POA: Diagnosis not present

## 2015-12-11 DIAGNOSIS — G603 Idiopathic progressive neuropathy: Secondary | ICD-10-CM | POA: Diagnosis not present

## 2015-12-11 DIAGNOSIS — I2510 Atherosclerotic heart disease of native coronary artery without angina pectoris: Secondary | ICD-10-CM | POA: Diagnosis not present

## 2015-12-11 DIAGNOSIS — I10 Essential (primary) hypertension: Secondary | ICD-10-CM | POA: Diagnosis not present

## 2015-12-12 DIAGNOSIS — R262 Difficulty in walking, not elsewhere classified: Secondary | ICD-10-CM | POA: Diagnosis not present

## 2015-12-17 DIAGNOSIS — R52 Pain, unspecified: Secondary | ICD-10-CM | POA: Diagnosis not present

## 2015-12-17 DIAGNOSIS — Z6824 Body mass index (BMI) 24.0-24.9, adult: Secondary | ICD-10-CM | POA: Diagnosis not present

## 2016-01-12 DIAGNOSIS — R262 Difficulty in walking, not elsewhere classified: Secondary | ICD-10-CM | POA: Diagnosis not present

## 2016-01-15 DIAGNOSIS — M5136 Other intervertebral disc degeneration, lumbar region: Secondary | ICD-10-CM | POA: Diagnosis not present

## 2016-01-15 DIAGNOSIS — S22080A Wedge compression fracture of T11-T12 vertebra, initial encounter for closed fracture: Secondary | ICD-10-CM | POA: Diagnosis not present

## 2016-01-17 DIAGNOSIS — Z01810 Encounter for preprocedural cardiovascular examination: Secondary | ICD-10-CM | POA: Diagnosis not present

## 2016-01-17 DIAGNOSIS — I639 Cerebral infarction, unspecified: Secondary | ICD-10-CM | POA: Diagnosis not present

## 2016-01-17 DIAGNOSIS — E785 Hyperlipidemia, unspecified: Secondary | ICD-10-CM | POA: Diagnosis not present

## 2016-01-17 DIAGNOSIS — I6523 Occlusion and stenosis of bilateral carotid arteries: Secondary | ICD-10-CM | POA: Diagnosis not present

## 2016-01-21 DIAGNOSIS — S22080A Wedge compression fracture of T11-T12 vertebra, initial encounter for closed fracture: Secondary | ICD-10-CM | POA: Diagnosis not present

## 2016-01-21 DIAGNOSIS — M818 Other osteoporosis without current pathological fracture: Secondary | ICD-10-CM | POA: Diagnosis not present

## 2016-01-21 DIAGNOSIS — M5126 Other intervertebral disc displacement, lumbar region: Secondary | ICD-10-CM | POA: Diagnosis not present

## 2016-01-21 DIAGNOSIS — Z6823 Body mass index (BMI) 23.0-23.9, adult: Secondary | ICD-10-CM | POA: Diagnosis not present

## 2016-01-30 DIAGNOSIS — E785 Hyperlipidemia, unspecified: Secondary | ICD-10-CM | POA: Diagnosis not present

## 2016-01-30 DIAGNOSIS — Z01810 Encounter for preprocedural cardiovascular examination: Secondary | ICD-10-CM | POA: Diagnosis not present

## 2016-01-30 DIAGNOSIS — I639 Cerebral infarction, unspecified: Secondary | ICD-10-CM | POA: Diagnosis not present

## 2016-01-30 DIAGNOSIS — I6523 Occlusion and stenosis of bilateral carotid arteries: Secondary | ICD-10-CM | POA: Diagnosis not present

## 2016-02-01 DIAGNOSIS — M48061 Spinal stenosis, lumbar region without neurogenic claudication: Secondary | ICD-10-CM | POA: Diagnosis not present

## 2016-02-01 DIAGNOSIS — W19XXXA Unspecified fall, initial encounter: Secondary | ICD-10-CM | POA: Diagnosis not present

## 2016-02-01 DIAGNOSIS — M545 Low back pain: Secondary | ICD-10-CM | POA: Diagnosis not present

## 2016-02-01 DIAGNOSIS — M542 Cervicalgia: Secondary | ICD-10-CM | POA: Diagnosis not present

## 2016-02-01 DIAGNOSIS — S22080A Wedge compression fracture of T11-T12 vertebra, initial encounter for closed fracture: Secondary | ICD-10-CM | POA: Diagnosis not present

## 2016-02-01 DIAGNOSIS — M4807 Spinal stenosis, lumbosacral region: Secondary | ICD-10-CM | POA: Diagnosis not present

## 2016-02-01 DIAGNOSIS — M5126 Other intervertebral disc displacement, lumbar region: Secondary | ICD-10-CM | POA: Diagnosis not present

## 2016-02-01 DIAGNOSIS — Z6824 Body mass index (BMI) 24.0-24.9, adult: Secondary | ICD-10-CM | POA: Diagnosis not present

## 2016-02-07 DIAGNOSIS — M546 Pain in thoracic spine: Secondary | ICD-10-CM | POA: Diagnosis not present

## 2016-02-07 DIAGNOSIS — Z7902 Long term (current) use of antithrombotics/antiplatelets: Secondary | ICD-10-CM | POA: Diagnosis not present

## 2016-02-07 DIAGNOSIS — M545 Low back pain: Secondary | ICD-10-CM | POA: Diagnosis not present

## 2016-02-07 DIAGNOSIS — M4854XA Collapsed vertebra, not elsewhere classified, thoracic region, initial encounter for fracture: Secondary | ICD-10-CM | POA: Diagnosis not present

## 2016-02-07 DIAGNOSIS — S22080A Wedge compression fracture of T11-T12 vertebra, initial encounter for closed fracture: Secondary | ICD-10-CM | POA: Diagnosis not present

## 2016-02-07 DIAGNOSIS — I252 Old myocardial infarction: Secondary | ICD-10-CM | POA: Diagnosis not present

## 2016-02-07 DIAGNOSIS — Z8673 Personal history of transient ischemic attack (TIA), and cerebral infarction without residual deficits: Secondary | ICD-10-CM | POA: Diagnosis not present

## 2016-02-07 DIAGNOSIS — X58XXXA Exposure to other specified factors, initial encounter: Secondary | ICD-10-CM | POA: Diagnosis not present

## 2016-02-11 DIAGNOSIS — R262 Difficulty in walking, not elsewhere classified: Secondary | ICD-10-CM | POA: Diagnosis not present

## 2016-02-15 DIAGNOSIS — M4854XA Collapsed vertebra, not elsewhere classified, thoracic region, initial encounter for fracture: Secondary | ICD-10-CM | POA: Diagnosis not present

## 2016-02-15 DIAGNOSIS — X58XXXA Exposure to other specified factors, initial encounter: Secondary | ICD-10-CM | POA: Diagnosis not present

## 2016-02-15 DIAGNOSIS — S22089A Unspecified fracture of T11-T12 vertebra, initial encounter for closed fracture: Secondary | ICD-10-CM | POA: Diagnosis not present

## 2016-03-13 DIAGNOSIS — R262 Difficulty in walking, not elsewhere classified: Secondary | ICD-10-CM | POA: Diagnosis not present

## 2016-04-13 DIAGNOSIS — R262 Difficulty in walking, not elsewhere classified: Secondary | ICD-10-CM | POA: Diagnosis not present

## 2016-04-21 DIAGNOSIS — M48062 Spinal stenosis, lumbar region with neurogenic claudication: Secondary | ICD-10-CM | POA: Diagnosis not present

## 2016-04-21 DIAGNOSIS — M542 Cervicalgia: Secondary | ICD-10-CM | POA: Diagnosis not present

## 2016-04-21 DIAGNOSIS — R269 Unspecified abnormalities of gait and mobility: Secondary | ICD-10-CM | POA: Diagnosis not present

## 2016-04-21 DIAGNOSIS — I679 Cerebrovascular disease, unspecified: Secondary | ICD-10-CM | POA: Diagnosis not present

## 2016-04-21 DIAGNOSIS — Z9181 History of falling: Secondary | ICD-10-CM | POA: Diagnosis not present

## 2016-04-21 DIAGNOSIS — G603 Idiopathic progressive neuropathy: Secondary | ICD-10-CM | POA: Diagnosis not present

## 2016-04-21 DIAGNOSIS — I639 Cerebral infarction, unspecified: Secondary | ICD-10-CM | POA: Diagnosis not present

## 2016-04-29 DIAGNOSIS — J449 Chronic obstructive pulmonary disease, unspecified: Secondary | ICD-10-CM | POA: Diagnosis not present

## 2016-04-29 DIAGNOSIS — Z6823 Body mass index (BMI) 23.0-23.9, adult: Secondary | ICD-10-CM | POA: Diagnosis not present

## 2016-04-29 DIAGNOSIS — J690 Pneumonitis due to inhalation of food and vomit: Secondary | ICD-10-CM | POA: Diagnosis not present

## 2016-04-29 DIAGNOSIS — J189 Pneumonia, unspecified organism: Secondary | ICD-10-CM | POA: Diagnosis not present

## 2016-05-11 DIAGNOSIS — R262 Difficulty in walking, not elsewhere classified: Secondary | ICD-10-CM | POA: Diagnosis not present

## 2016-05-12 DIAGNOSIS — M818 Other osteoporosis without current pathological fracture: Secondary | ICD-10-CM | POA: Diagnosis not present

## 2016-05-12 DIAGNOSIS — D649 Anemia, unspecified: Secondary | ICD-10-CM | POA: Diagnosis not present

## 2016-05-12 DIAGNOSIS — Z79899 Other long term (current) drug therapy: Secondary | ICD-10-CM | POA: Diagnosis not present

## 2016-05-12 DIAGNOSIS — E784 Other hyperlipidemia: Secondary | ICD-10-CM | POA: Diagnosis not present

## 2016-06-06 DIAGNOSIS — J189 Pneumonia, unspecified organism: Secondary | ICD-10-CM | POA: Diagnosis not present

## 2016-06-06 DIAGNOSIS — I10 Essential (primary) hypertension: Secondary | ICD-10-CM | POA: Diagnosis not present

## 2016-06-06 DIAGNOSIS — Z6823 Body mass index (BMI) 23.0-23.9, adult: Secondary | ICD-10-CM | POA: Diagnosis not present

## 2016-06-06 DIAGNOSIS — E784 Other hyperlipidemia: Secondary | ICD-10-CM | POA: Diagnosis not present

## 2016-06-06 DIAGNOSIS — J449 Chronic obstructive pulmonary disease, unspecified: Secondary | ICD-10-CM | POA: Diagnosis not present

## 2016-06-06 DIAGNOSIS — G603 Idiopathic progressive neuropathy: Secondary | ICD-10-CM | POA: Diagnosis not present

## 2016-06-06 DIAGNOSIS — I2510 Atherosclerotic heart disease of native coronary artery without angina pectoris: Secondary | ICD-10-CM | POA: Diagnosis not present

## 2016-06-06 DIAGNOSIS — S22080A Wedge compression fracture of T11-T12 vertebra, initial encounter for closed fracture: Secondary | ICD-10-CM | POA: Diagnosis not present

## 2016-06-11 DIAGNOSIS — R262 Difficulty in walking, not elsewhere classified: Secondary | ICD-10-CM | POA: Diagnosis not present

## 2016-07-11 DIAGNOSIS — R262 Difficulty in walking, not elsewhere classified: Secondary | ICD-10-CM | POA: Diagnosis not present

## 2016-07-22 DIAGNOSIS — H40001 Preglaucoma, unspecified, right eye: Secondary | ICD-10-CM | POA: Diagnosis not present

## 2016-07-22 DIAGNOSIS — H348312 Tributary (branch) retinal vein occlusion, right eye, stable: Secondary | ICD-10-CM | POA: Diagnosis not present

## 2016-07-22 DIAGNOSIS — H25811 Combined forms of age-related cataract, right eye: Secondary | ICD-10-CM | POA: Diagnosis not present

## 2016-08-12 DIAGNOSIS — E785 Hyperlipidemia, unspecified: Secondary | ICD-10-CM | POA: Diagnosis not present

## 2016-08-12 DIAGNOSIS — I10 Essential (primary) hypertension: Secondary | ICD-10-CM | POA: Diagnosis not present

## 2016-08-12 DIAGNOSIS — J449 Chronic obstructive pulmonary disease, unspecified: Secondary | ICD-10-CM | POA: Diagnosis not present

## 2016-08-12 DIAGNOSIS — G629 Polyneuropathy, unspecified: Secondary | ICD-10-CM | POA: Diagnosis not present

## 2016-08-12 DIAGNOSIS — I252 Old myocardial infarction: Secondary | ICD-10-CM | POA: Diagnosis not present

## 2016-08-12 DIAGNOSIS — Z7982 Long term (current) use of aspirin: Secondary | ICD-10-CM | POA: Diagnosis not present

## 2016-08-12 DIAGNOSIS — Z79899 Other long term (current) drug therapy: Secondary | ICD-10-CM | POA: Diagnosis not present

## 2016-08-12 DIAGNOSIS — Z8673 Personal history of transient ischemic attack (TIA), and cerebral infarction without residual deficits: Secondary | ICD-10-CM | POA: Diagnosis not present

## 2016-08-12 DIAGNOSIS — Z955 Presence of coronary angioplasty implant and graft: Secondary | ICD-10-CM | POA: Diagnosis not present

## 2016-08-12 DIAGNOSIS — K219 Gastro-esophageal reflux disease without esophagitis: Secondary | ICD-10-CM | POA: Diagnosis not present

## 2016-08-12 DIAGNOSIS — Z87891 Personal history of nicotine dependence: Secondary | ICD-10-CM | POA: Diagnosis not present

## 2016-08-12 DIAGNOSIS — H25811 Combined forms of age-related cataract, right eye: Secondary | ICD-10-CM | POA: Diagnosis not present

## 2016-08-12 DIAGNOSIS — H259 Unspecified age-related cataract: Secondary | ICD-10-CM | POA: Diagnosis not present

## 2016-09-11 DIAGNOSIS — H524 Presbyopia: Secondary | ICD-10-CM | POA: Diagnosis not present

## 2016-09-26 DIAGNOSIS — Z961 Presence of intraocular lens: Secondary | ICD-10-CM | POA: Diagnosis not present

## 2016-09-29 ENCOUNTER — Ambulatory Visit (INDEPENDENT_AMBULATORY_CARE_PROVIDER_SITE_OTHER): Payer: PPO | Admitting: Cardiology

## 2016-09-29 ENCOUNTER — Encounter: Payer: Self-pay | Admitting: Cardiology

## 2016-09-29 VITALS — BP 112/66 | HR 62 | Ht 67.0 in | Wt 157.0 lb

## 2016-09-29 DIAGNOSIS — I6523 Occlusion and stenosis of bilateral carotid arteries: Secondary | ICD-10-CM | POA: Diagnosis not present

## 2016-09-29 DIAGNOSIS — I2510 Atherosclerotic heart disease of native coronary artery without angina pectoris: Secondary | ICD-10-CM | POA: Diagnosis not present

## 2016-09-29 DIAGNOSIS — I639 Cerebral infarction, unspecified: Secondary | ICD-10-CM | POA: Diagnosis not present

## 2016-09-29 DIAGNOSIS — E785 Hyperlipidemia, unspecified: Secondary | ICD-10-CM

## 2016-09-29 DIAGNOSIS — Z9181 History of falling: Secondary | ICD-10-CM | POA: Diagnosis not present

## 2016-09-29 NOTE — Progress Notes (Signed)
Cardiology Office Note:    Date:  09/29/2016   ID:  Chris Braun, DOB May 02, 1935, MRN [id number]  PCP:  [name]., MD  Cardiologist:  Jenean Lindau, MD   Referring MD: [name]., MD    ASSESSMENT:    1. Coronary artery disease involving native coronary artery of native heart without angina pectoris   2. Carotid stenosis, bilateral   3. Cerebrovascular accident (CVA), unspecified mechanism ([place])   4. Dyslipidemia   5. Risk for falls    PLAN:    In order of problems listed above:  1. Secondary prevention stressed to the patient. Importance of compliance with diet and medications stressed. His blood pressure is stable. He leads a sedentary lifestyle as he has significant issues with. After the fact that his had a stroke. Diet was discussed with dyslipidemia. His lipids are followed by his primary care physician. He will be seen in follow-up appointment in 6 months or earlier if he has any concerns.   Medication Adjustments/Labs and Tests Ordered: Current medicines are reviewed at length with the patient today.  Concerns regarding medicines are outlined above.  No orders of the defined types were placed in this encounter.  No orders of the defined types were placed in this encounter.    History of Present Illness:    Chris Braun is a 81 y.o. male who is being seen today for the evaluation of Coronary artery disease at the request of [name]., MD. Patient has been under my evaluation and care in the past and he wants to follow-up with me at this practice and wants his care to be transferred. He mentions to me that he leads a sedentary lifestyle secondary to his stroke. No chest pain orthopnea or PND. At the time of my evaluation he is alert awake oriented and in no distress. He mentions to me that his primary care physician does blood work including lipids on a regular basis.   Past Medical History:  Diagnosis Date  . Cataracts, bilateral   . Compression  fracture    BAck  . COPD (chronic obstructive pulmonary disease) ([place])   . Diverticulosis    per colonscopy  . GERD (gastroesophageal reflux disease)   . Hypertension   . Kidney stones   . Myocardial infarction [facility]) 2002    Past Surgical History:  Procedure Laterality Date  . CARDIAC CATHETERIZATION  2002,   stents   . Colonscopy    . HIP FRACTURE SURGERY  2008   right  . LITHOTRIPSY    . LUMBAR LAMINECTOMY/DECOMPRESSION MICRODISCECTOMY  03/15/2012   Procedure: LUMBAR LAMINECTOMY/DECOMPRESSION MICRODISCECTOMY 1 LEVEL;  Surgeon: Elaina Hoops, MD;  Location: [place] [facility];  Service: Neurosurgery;  Laterality: Left;  Left lumbar three-four decompressive lumbar laminectomy, discectomy  . MAXIMUM ACCESS (MAS)POSTERIOR LUMBAR INTERBODY FUSION (PLIF) 2 LEVEL N/A 12/08/2012   Procedure: FOR MAXIMUM ACCESS (MAS) POSTERIOR LUMBAR INTERBODY FUSION (PLIF) 2 LEVEL;  Surgeon: Elaina Hoops, MD;  Location: [place] NEUR[facility] ORS;  Service: Neurosurgery;  Laterality: N/A;  FOR MAXIMUM ACCESS (MAS) POSTERIOR LUMBAR INTERBODY FUSION (PLIF) 2 LEVEL  . ROTATOR CUFF REPAIR     bil   . SALIVARY STONE REMOVAL     1964  . VT study     with Ablation    Current Medications: Current Meds  Medication Sig  . albuterol (PROVENTIL HFA;VENTOLIN HFA) 108 (90 BASE) MCG/ACT inhaler Inhale 2 puffs into the lungs every 6 (six) hours as needed  for wheezing.  Marland Kitchen alendronate (FOSAMAX) 70 MG tablet Take 70 mg by mouth every 7 (seven) days. Take with a full glass of water on an empty stomach. Takes on Friday.  Marland Kitchen aspirin EC 81 MG tablet Take 81 mg by mouth daily.  Marland Kitchen atorvastatin (LIPITOR) 20 MG tablet Take 20 mg by mouth daily.  . benzonatate (TESSALON) 200 MG capsule TAKE ONE CAPSULE BY MOUTH THREE TIMES DAILY AS NEEDED FOR COUGH  . Calcium Carb-Cholecalciferol (CALCIUM-VITAMIN D) 500-200 MG-UNIT tablet Take 1 tablet by mouth daily.  . clopidogrel (PLAVIX) 75 MG tablet Take 75 mg by mouth daily.  Marland Kitchen donepezil (ARICEPT) 5 MG tablet  Take 5 mg by mouth daily.  . DUREZOL 0.05 % EMUL Place 1 drop into both eyes daily as needed.  . Guaifenesin 1200 MG TB12 Take 1,200 mg by mouth daily.  Marland Kitchen loratadine (CLARITIN) 10 MG tablet Take 10 mg by mouth.  . metoprolol succinate (TOPROL-XL) 25 MG 24 hr tablet Take 25 mg by mouth 2 (two) times daily.  Marland Kitchen oxyCODONE-acetaminophen (PERCOCET) 10-325 MG per tablet Take 1 tablet by mouth every 4 (four) hours as needed.  Marland Kitchen rOPINIRole (REQUIP) 0.5 MG tablet Take 0.5 mg by mouth 3 (three) times daily.  . SYMBICORT 160-4.5 MCG/ACT inhaler Inhale 2 puffs into the lungs 2 (two) times daily.  . Vitamin D, Ergocalciferol, (DRISDOL) 50000 units CAPS capsule Take 50,000 Units by mouth once a week.  . [DISCONTINUED] atorvastatin (LIPITOR) 20 MG tablet Take 20 mg by mouth daily.     Allergies:   Oxycodone   Social History   Social History  . Marital status: Married    Spouse name: N/A  . Number of children: N/A  . Years of education: N/A   Social History Main Topics  . Smoking status: Former Smoker    Packs/day: 1.00    Years: 45.00    Types: Cigarettes    Quit date: 02/02/2001  . Smokeless tobacco: Current User    Types: Chew  . Alcohol use No  . Drug use: No  . Sexual activity: Not Asked   Other Topics Concern  . None   Social History Narrative  . None     Family History: The patient's family history is not on file.  ROS:   Please see the history of present illness.    All other systems reviewed and are negative.  EKGs/Labs/Other Studies Reviewed:    The following studies were reviewed today: I reviewed records from previous office visits.   Recent Labs: No results found for requested labs within last 8760 hours.  Recent Lipid Panel No results found for: CHOL, TRIG, HDL, CHOLHDL, VLDL, LDLCALC, LDLDIRECT  Physical Exam:    VS:  BP 112/66   Pulse 62   Ht 5\' 7"  (1.702 m)   Wt 157 lb (71.2 kg)   SpO2 98%   BMI 24.59 kg/m     Wt Readings from Last 3 Encounters:    09/29/16 157 lb (71.2 kg)  12/08/12 159 lb 3 oz (72.2 kg)  12/03/12 159 lb 3 oz (72.2 kg)     GEN: Patient is in no acute distress HEENT: Normal NECK: No JVD; No carotid bruits LYMPHATICS: No lymphadenopathy CARDIAC: S1 S2 regular, 2/6 systolic murmur at the apex. RESPIRATORY:  Clear to auscultation without rales, wheezing or rhonchi  ABDOMEN: Soft, non-tender, non-distended MUSCULOSKELETAL:  No edema; No deformity  SKIN: Warm and dry NEUROLOGIC:  Alert and oriented x 3 PSYCHIATRIC:  Normal affect  Signed, Jenean Lindau, MD  09/29/2016 5:06 PM    [employer]

## 2016-09-29 NOTE — Patient Instructions (Signed)

## 2016-11-19 DIAGNOSIS — Z23 Encounter for immunization: Secondary | ICD-10-CM | POA: Diagnosis not present

## 2017-01-24 ENCOUNTER — Other Ambulatory Visit: Payer: Self-pay | Admitting: Cardiology

## 2017-03-16 DIAGNOSIS — S22080A Wedge compression fracture of T11-T12 vertebra, initial encounter for closed fracture: Secondary | ICD-10-CM | POA: Diagnosis not present

## 2017-03-16 DIAGNOSIS — J449 Chronic obstructive pulmonary disease, unspecified: Secondary | ICD-10-CM | POA: Diagnosis not present

## 2017-03-16 DIAGNOSIS — M818 Other osteoporosis without current pathological fracture: Secondary | ICD-10-CM | POA: Diagnosis not present

## 2017-03-16 DIAGNOSIS — F0390 Unspecified dementia without behavioral disturbance: Secondary | ICD-10-CM | POA: Diagnosis not present

## 2017-03-16 DIAGNOSIS — E785 Hyperlipidemia, unspecified: Secondary | ICD-10-CM | POA: Diagnosis not present

## 2017-03-16 DIAGNOSIS — Z6825 Body mass index (BMI) 25.0-25.9, adult: Secondary | ICD-10-CM | POA: Diagnosis not present

## 2017-03-16 DIAGNOSIS — Z79899 Other long term (current) drug therapy: Secondary | ICD-10-CM | POA: Diagnosis not present

## 2017-03-16 DIAGNOSIS — I10 Essential (primary) hypertension: Secondary | ICD-10-CM | POA: Diagnosis not present

## 2017-03-16 DIAGNOSIS — M5431 Sciatica, right side: Secondary | ICD-10-CM | POA: Diagnosis not present

## 2017-03-16 DIAGNOSIS — I2510 Atherosclerotic heart disease of native coronary artery without angina pectoris: Secondary | ICD-10-CM | POA: Diagnosis not present

## 2017-03-16 DIAGNOSIS — H26493 Other secondary cataract, bilateral: Secondary | ICD-10-CM | POA: Diagnosis not present

## 2017-03-16 DIAGNOSIS — H348392 Tributary (branch) retinal vein occlusion, unspecified eye, stable: Secondary | ICD-10-CM | POA: Diagnosis not present

## 2017-03-16 DIAGNOSIS — D649 Anemia, unspecified: Secondary | ICD-10-CM | POA: Diagnosis not present

## 2017-03-16 DIAGNOSIS — H353131 Nonexudative age-related macular degeneration, bilateral, early dry stage: Secondary | ICD-10-CM | POA: Diagnosis not present

## 2017-04-23 ENCOUNTER — Encounter: Payer: Self-pay | Admitting: Cardiology

## 2017-04-23 ENCOUNTER — Ambulatory Visit (INDEPENDENT_AMBULATORY_CARE_PROVIDER_SITE_OTHER): Payer: PPO | Admitting: Cardiology

## 2017-04-23 VITALS — BP 123/72 | HR 66 | Ht 67.0 in | Wt 166.0 lb

## 2017-04-23 DIAGNOSIS — E785 Hyperlipidemia, unspecified: Secondary | ICD-10-CM | POA: Diagnosis not present

## 2017-04-23 DIAGNOSIS — I2510 Atherosclerotic heart disease of native coronary artery without angina pectoris: Secondary | ICD-10-CM | POA: Diagnosis not present

## 2017-04-23 MED ORDER — NITROGLYCERIN 0.4 MG SL SUBL: 0.4000 mg | SUBLINGUAL_TABLET | SUBLINGUAL | 6 refills | Status: DC | PRN

## 2017-04-23 NOTE — Progress Notes (Signed)
Cardiology Office Note:    Date:  04/23/2017   ID:  Chris Braun, DOB June 04, 1935, MRN [id number]  PCP:  Chris Doyne., Braun  Cardiologist:  Chris Braun   Referring Braun: Chris Doyne., Braun    ASSESSMENT:    1. Coronary artery disease involving native coronary artery of native heart without angina pectoris   2. Dyslipidemia    PLAN:    In order of problems listed above:  1. I discussed my findings with the patient at extensive length.  Secondary prevention stressed with the patient.  Importance of compliance with diet and medications stressed and patient vocalized understanding. 2. His blood work is followed by his primary care physician.  His blood pressure is stable. 3. Patient will be seen in follow-up appointment in 6 months or earlier if the patient has any concerns 4. Sublingual nitroglycerin prescription was sent, its protocol and 911 protocol explained and the patient vocalized understanding questions were answered to the patient's satisfaction   Medication Adjustments/Labs and Tests Ordered: Current medicines are reviewed at length with the patient today.  Concerns regarding medicines are outlined above.  Orders Placed This Encounter  Procedures  . EKG 12-Lead   Meds ordered this encounter  Medications  . nitroGLYCERIN (NITROSTAT) 0.4 MG SL tablet    Sig: Place 1 tablet (0.4 mg total) under the tongue every 5 (five) minutes as needed for chest pain.    Dispense:  11 tablet    Refill:  6     Chief Complaint  Patient presents with  . Follow-up     History of Present Illness:    Chris Braun is a 82 y.o. male.  Patient has past medical history of coronary artery disease.  He denies any problems at this time and takes care of activities of daily living.  No chest pain orthopnea or PND.  His wife mentions to me that he is beginning to get increasingly forgetful.  He is asymptomatic with activities of daily living.  Past Medical History:  Diagnosis Date  .  Cataracts, bilateral   . Compression fracture    BAck  . COPD (chronic obstructive pulmonary disease) ([place])   . Diverticulosis    per colonscopy  . GERD (gastroesophageal reflux disease)   . Hypertension   . Kidney stones   . Myocardial infarction [facility]) 2002    Past Surgical History:  Procedure Laterality Date  . CARDIAC CATHETERIZATION  2002,   stents   . Colonscopy    . HIP FRACTURE SURGERY  2008   right  . LITHOTRIPSY    . LUMBAR LAMINECTOMY/DECOMPRESSION MICRODISCECTOMY  03/15/2012   Procedure: LUMBAR LAMINECTOMY/DECOMPRESSION MICRODISCECTOMY 1 LEVEL;  Surgeon: Elaina Hoops, Braun;  Location: [place] NEURO ORS;  Service: Neurosurgery;  Laterality: Left;  Left lumbar three-four decompressive lumbar laminectomy, discectomy  . MAXIMUM ACCESS (MAS)POSTERIOR LUMBAR INTERBODY FUSION (PLIF) 2 LEVEL N/A 12/08/2012   Procedure: FOR MAXIMUM ACCESS (MAS) POSTERIOR LUMBAR INTERBODY FUSION (PLIF) 2 LEVEL;  Surgeon: Elaina Hoops, Braun;  Location: [place] NEURO ORS;  Service: Neurosurgery;  Laterality: N/A;  FOR MAXIMUM ACCESS (MAS) POSTERIOR LUMBAR INTERBODY FUSION (PLIF) 2 LEVEL  . ROTATOR CUFF REPAIR     bil   . SALIVARY STONE REMOVAL     1964  . VT study     with Ablation    Current Medications: Current Meds  Medication Sig  . albuterol (PROVENTIL HFA;VENTOLIN HFA) 108 (90 BASE) MCG/ACT inhaler Inhale 2 puffs into the lungs  every 6 (six) hours as needed for wheezing.  Marland Kitchen alendronate (FOSAMAX) 70 MG tablet Take 70 mg by mouth every 7 (seven) days. Take with a full glass of water on an empty stomach. Takes on Friday.  Marland Kitchen aspirin EC 81 MG tablet Take 81 mg by mouth daily.  Marland Kitchen atorvastatin (LIPITOR) 20 MG tablet TAKE ONE TABLET BY MOUTH ONCE DAILY  . benzonatate (TESSALON) 200 MG capsule TAKE ONE CAPSULE BY MOUTH THREE TIMES DAILY AS NEEDED FOR COUGH  . Calcium Carb-Cholecalciferol (CALCIUM-VITAMIN D) 500-200 MG-UNIT tablet Take 1 tablet by mouth daily.  . clopidogrel (PLAVIX) 75 MG tablet Take 75 mg by  mouth daily.  Marland Kitchen donepezil (ARICEPT) 5 MG tablet Take 5 mg by mouth daily.  . DUREZOL 0.05 % EMUL Place 1 drop into both eyes daily as needed.  . Guaifenesin 1200 MG TB12 Take 1,200 mg by mouth daily.  Marland Kitchen lactulose (CHRONULAC) 10 GM/15ML solution TAKE 55ml to 55ml BY MOUTH AT bedtime  . loratadine (CLARITIN) 10 MG tablet Take 10 mg by mouth.  . metoprolol succinate (TOPROL-XL) 25 MG 24 hr tablet Take 25 mg by mouth 2 (two) times daily.  Marland Kitchen oxyCODONE-acetaminophen (PERCOCET) 10-325 MG per tablet Take 1 tablet by mouth every 4 (four) hours as needed.  Marland Kitchen rOPINIRole (REQUIP) 0.5 MG tablet Take 0.5 mg by mouth 3 (three) times daily.  . SYMBICORT 160-4.5 MCG/ACT inhaler Inhale 2 puffs into the lungs 2 (two) times daily.  . Vitamin D, Ergocalciferol, (DRISDOL) 50000 units CAPS capsule Take 50,000 Units by mouth once a week.     Allergies:   Oxycodone   Social History   Socioeconomic History  . Marital status: Married    Spouse name: None  . Number of children: None  . Years of education: None  . Highest education level: None  Social Needs  . Financial resource strain: None  . Food insecurity - worry: None  . Food insecurity - inability: None  . Transportation needs - medical: None  . Transportation needs - non-medical: None  Occupational History  . None  Tobacco Use  . Smoking status: Former Smoker    Packs/day: 1.00    Years: 45.00    Pack years: 45.00    Types: Cigarettes    Last attempt to quit: 02/02/2001    Years since quitting: 16.2  . Smokeless tobacco: Current User    Types: Chew  Substance and Sexual Activity  . Alcohol use: No  . Drug use: No  . Sexual activity: None  Other Topics Concern  . None  Social History Narrative  . None     Family History: The patient's family history is not on file.  ROS:   Please see the history of present illness.    All other systems reviewed and are negative.  EKGs/Labs/Other Studies Reviewed:    The following studies were  reviewed today: EKG done today reveals sinus rhythm and nonspecific ST-T changes and was within normal limits   Recent Labs: No results found for requested labs within last 8760 hours.  Recent Lipid Panel No results found for: CHOL, TRIG, HDL, CHOLHDL, VLDL, LDLCALC, LDLDIRECT  Physical Exam:    VS:  BP 123/72 (BP Location: Right Arm, Patient Position: Sitting, Cuff Size: Normal)   Pulse 66   Ht 5\' 7"  (1.702 m)   Wt 166 lb (75.3 kg)   SpO2 98%   BMI 26.00 kg/m     Wt Readings from Last 3 Encounters:  04/23/17 166 lb (  75.3 kg)  09/29/16 157 lb (71.2 kg)  12/08/12 159 lb 3 oz (72.2 kg)     GEN: Patient is in no acute distress HEENT: Normal NECK: No JVD; No carotid bruits LYMPHATICS: No lymphadenopathy CARDIAC: Hear sounds regular, 2/6 systolic murmur at the apex. RESPIRATORY:  Clear to auscultation without rales, wheezing or rhonchi  ABDOMEN: Soft, non-tender, non-distended MUSCULOSKELETAL:  No edema; No deformity  SKIN: Warm and dry NEUROLOGIC:  Alert and oriented x 3 PSYCHIATRIC:  Normal affect   Signed, Chris Braun  04/23/2017 5:04 PM    [facility]

## 2017-04-23 NOTE — Patient Instructions (Addendum)
Medication Instructions:  Your physician has recommended you make the following change in your medication:  START The proper use and anticipated side effects of nitroglycerine has been carefully explained.  If a single episode of chest pain is not relieved by one tablet, the patient will try another within 5 minutes; and if this doesn't relieve the pain, the patient is instructed to call 911 for transportation to an emergency department.     Labwork: none  Testing/Procedures: none  Follow-Up: Your physician recommends that you schedule a follow-up appointment in: 6 months   Any Other Special Instructions Will Be Listed Below (If Applicable).     If you need a refill on your cardiac medications before your next appointment, please call your pharmacy.

## 2017-05-19 DIAGNOSIS — D1801 Hemangioma of skin and subcutaneous tissue: Secondary | ICD-10-CM | POA: Diagnosis not present

## 2017-05-19 DIAGNOSIS — L821 Other seborrheic keratosis: Secondary | ICD-10-CM | POA: Diagnosis not present

## 2017-05-19 DIAGNOSIS — L814 Other melanin hyperpigmentation: Secondary | ICD-10-CM | POA: Diagnosis not present

## 2017-09-03 DIAGNOSIS — J690 Pneumonitis due to inhalation of food and vomit: Secondary | ICD-10-CM | POA: Diagnosis not present

## 2017-09-03 DIAGNOSIS — J449 Chronic obstructive pulmonary disease, unspecified: Secondary | ICD-10-CM | POA: Diagnosis not present

## 2017-09-03 DIAGNOSIS — E785 Hyperlipidemia, unspecified: Secondary | ICD-10-CM | POA: Diagnosis not present

## 2017-09-03 DIAGNOSIS — M818 Other osteoporosis without current pathological fracture: Secondary | ICD-10-CM | POA: Diagnosis not present

## 2017-09-03 DIAGNOSIS — I2510 Atherosclerotic heart disease of native coronary artery without angina pectoris: Secondary | ICD-10-CM | POA: Diagnosis not present

## 2017-09-03 DIAGNOSIS — Z79899 Other long term (current) drug therapy: Secondary | ICD-10-CM | POA: Diagnosis not present

## 2017-09-03 DIAGNOSIS — M5431 Sciatica, right side: Secondary | ICD-10-CM | POA: Diagnosis not present

## 2017-09-03 DIAGNOSIS — Z6826 Body mass index (BMI) 26.0-26.9, adult: Secondary | ICD-10-CM | POA: Diagnosis not present

## 2017-09-03 DIAGNOSIS — E663 Overweight: Secondary | ICD-10-CM | POA: Diagnosis not present

## 2017-09-03 DIAGNOSIS — Z1339 Encounter for screening examination for other mental health and behavioral disorders: Secondary | ICD-10-CM | POA: Diagnosis not present

## 2017-09-14 DIAGNOSIS — H348392 Tributary (branch) retinal vein occlusion, unspecified eye, stable: Secondary | ICD-10-CM | POA: Diagnosis not present

## 2017-09-14 DIAGNOSIS — H26493 Other secondary cataract, bilateral: Secondary | ICD-10-CM | POA: Diagnosis not present

## 2017-09-14 DIAGNOSIS — H353131 Nonexudative age-related macular degeneration, bilateral, early dry stage: Secondary | ICD-10-CM | POA: Diagnosis not present

## 2017-09-24 ENCOUNTER — Telehealth: Payer: Self-pay | Admitting: Cardiology

## 2017-09-24 ENCOUNTER — Other Ambulatory Visit: Payer: Self-pay | Admitting: Cardiology

## 2017-09-24 NOTE — Telephone Encounter (Signed)
Chris Braun, sent a skype due to pt's wife calling because I denied pt's refill request. I denied medication because medication that was requested instructions did not match what is currently on med list. She asked me to call pt for clarification. Spoke with wife (on [place]) and she states pt no longer takes metoprolol 25mg  2 times daily,he takes 1/2 tab daily and this was changed by pt's pcp. Informed her I would look into refill request. Riki Sheer who states since pcp made change they will need to refill this. I also checked and saw none of [place] doctors every filled this. Attempted to reach wife twice to explain, no answer or option to leave vm

## 2017-09-27 DIAGNOSIS — Z79899 Other long term (current) drug therapy: Secondary | ICD-10-CM | POA: Diagnosis not present

## 2017-09-27 DIAGNOSIS — R0602 Shortness of breath: Secondary | ICD-10-CM | POA: Diagnosis not present

## 2017-09-27 DIAGNOSIS — I6523 Occlusion and stenosis of bilateral carotid arteries: Secondary | ICD-10-CM | POA: Diagnosis not present

## 2017-09-27 DIAGNOSIS — R29700 NIHSS score 0: Secondary | ICD-10-CM | POA: Diagnosis not present

## 2017-09-27 DIAGNOSIS — I69398 Other sequelae of cerebral infarction: Secondary | ICD-10-CM | POA: Diagnosis not present

## 2017-09-27 DIAGNOSIS — J449 Chronic obstructive pulmonary disease, unspecified: Secondary | ICD-10-CM | POA: Diagnosis not present

## 2017-09-27 DIAGNOSIS — R51 Headache: Secondary | ICD-10-CM | POA: Diagnosis not present

## 2017-09-27 DIAGNOSIS — I6789 Other cerebrovascular disease: Secondary | ICD-10-CM | POA: Diagnosis not present

## 2017-09-27 DIAGNOSIS — Z7982 Long term (current) use of aspirin: Secondary | ICD-10-CM | POA: Diagnosis not present

## 2017-09-27 DIAGNOSIS — F0390 Unspecified dementia without behavioral disturbance: Secondary | ICD-10-CM | POA: Diagnosis not present

## 2017-09-27 DIAGNOSIS — H532 Diplopia: Secondary | ICD-10-CM | POA: Diagnosis not present

## 2017-09-27 DIAGNOSIS — G2581 Restless legs syndrome: Secondary | ICD-10-CM | POA: Diagnosis not present

## 2017-09-27 DIAGNOSIS — I2510 Atherosclerotic heart disease of native coronary artery without angina pectoris: Secondary | ICD-10-CM | POA: Diagnosis not present

## 2017-09-27 DIAGNOSIS — R2689 Other abnormalities of gait and mobility: Secondary | ICD-10-CM | POA: Diagnosis not present

## 2017-09-27 DIAGNOSIS — E785 Hyperlipidemia, unspecified: Secondary | ICD-10-CM | POA: Diagnosis not present

## 2017-09-27 DIAGNOSIS — I10 Essential (primary) hypertension: Secondary | ICD-10-CM | POA: Diagnosis not present

## 2017-09-27 DIAGNOSIS — I639 Cerebral infarction, unspecified: Secondary | ICD-10-CM | POA: Diagnosis not present

## 2017-09-28 DIAGNOSIS — I6789 Other cerebrovascular disease: Secondary | ICD-10-CM

## 2017-09-30 ENCOUNTER — Other Ambulatory Visit: Payer: Self-pay

## 2017-09-30 DIAGNOSIS — I2510 Atherosclerotic heart disease of native coronary artery without angina pectoris: Secondary | ICD-10-CM | POA: Diagnosis not present

## 2017-09-30 DIAGNOSIS — E785 Hyperlipidemia, unspecified: Secondary | ICD-10-CM | POA: Diagnosis not present

## 2017-09-30 DIAGNOSIS — Z6826 Body mass index (BMI) 26.0-26.9, adult: Secondary | ICD-10-CM | POA: Diagnosis not present

## 2017-09-30 DIAGNOSIS — H532 Diplopia: Secondary | ICD-10-CM | POA: Diagnosis not present

## 2017-09-30 DIAGNOSIS — Z79899 Other long term (current) drug therapy: Secondary | ICD-10-CM | POA: Diagnosis not present

## 2017-09-30 DIAGNOSIS — J449 Chronic obstructive pulmonary disease, unspecified: Secondary | ICD-10-CM | POA: Diagnosis not present

## 2017-09-30 DIAGNOSIS — F0390 Unspecified dementia without behavioral disturbance: Secondary | ICD-10-CM | POA: Diagnosis not present

## 2017-09-30 DIAGNOSIS — I10 Essential (primary) hypertension: Secondary | ICD-10-CM | POA: Diagnosis not present

## 2017-09-30 NOTE — Patient Outreach (Signed)
[facility]) Care Management  09/30/2017  MARGARET STAGGS Jul 13, 1935 [id number]     Transition of Care Referral  Referral Date: 09/30/17 Referral Source: HTA Discharge Report Date of Admission: unknown Diagnosis: unknown Date of Discharge: 09/29/17 Facility: [place]: HTA   Referral received. No outreach warranted at this time. TOC will be completed by primary care provider office who will refer to [facility] care mgmt if needed.     Plan: RN CM will close case at this time.   Enzo Montgomery, RN,BSN,CCM [facility] Telephonic Care Management Coordinator Direct Phone: [phone] Toll Free: [phone] Fax: [phone]

## 2017-10-01 DIAGNOSIS — H532 Diplopia: Secondary | ICD-10-CM | POA: Diagnosis not present

## 2017-10-01 DIAGNOSIS — H4901 Third [oculomotor] nerve palsy, right eye: Secondary | ICD-10-CM | POA: Diagnosis not present

## 2017-10-05 DIAGNOSIS — E538 Deficiency of other specified B group vitamins: Secondary | ICD-10-CM | POA: Diagnosis not present

## 2017-10-05 DIAGNOSIS — H532 Diplopia: Secondary | ICD-10-CM | POA: Diagnosis not present

## 2017-10-05 DIAGNOSIS — Z8673 Personal history of transient ischemic attack (TIA), and cerebral infarction without residual deficits: Secondary | ICD-10-CM | POA: Diagnosis not present

## 2017-10-05 DIAGNOSIS — F0390 Unspecified dementia without behavioral disturbance: Secondary | ICD-10-CM | POA: Diagnosis not present

## 2017-10-05 DIAGNOSIS — H02401 Unspecified ptosis of right eyelid: Secondary | ICD-10-CM | POA: Diagnosis not present

## 2017-10-07 ENCOUNTER — Encounter: Payer: Self-pay | Admitting: Neurology

## 2017-10-07 ENCOUNTER — Ambulatory Visit (INDEPENDENT_AMBULATORY_CARE_PROVIDER_SITE_OTHER): Payer: PPO | Admitting: Neurology

## 2017-10-07 VITALS — BP 121/70 | HR 59 | Ht 67.0 in | Wt 168.5 lb

## 2017-10-07 DIAGNOSIS — R5382 Chronic fatigue, unspecified: Secondary | ICD-10-CM

## 2017-10-07 DIAGNOSIS — E538 Deficiency of other specified B group vitamins: Secondary | ICD-10-CM

## 2017-10-07 DIAGNOSIS — G4489 Other headache syndrome: Secondary | ICD-10-CM | POA: Diagnosis not present

## 2017-10-07 DIAGNOSIS — H532 Diplopia: Secondary | ICD-10-CM

## 2017-10-07 DIAGNOSIS — H4901 Third [oculomotor] nerve palsy, right eye: Secondary | ICD-10-CM | POA: Diagnosis not present

## 2017-10-07 HISTORY — DX: Third (oculomotor) nerve palsy, right eye: H49.01

## 2017-10-07 NOTE — Progress Notes (Signed)
Reason for visit: Double vision  Referring physician: Dr. Pola Corn is a 82 y.o. male  History of present illness:  Chris Braun is a 82 year old right-handed white male with a history of hypertension and cerebrovascular disease.  The patient has had multiple retinal infarcts in the past, he has extensive white matter changes by MRI of the brain, he has some mild right-sided weakness associated with a prior stroke.  He also has low back pain and right-sided sciatica.  The patient began having new symptoms on 27 September 2017.  The patient was admitted to [facility] with onset of double vision.  He reported some discomfort around the right eye that began shortly after the double vision started.  The patient has a sensation of gait instability, he has not had any falls, he normally uses a walker for ambulation since the prior stroke events.  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  A carotid Doppler study shows a moderate amount of bilateral atherosclerotic plaque, right greater than left, no hemodynamically significant stenosis was seen.  A 2D echocardiogram did not show an embolic source for stroke.  The patient has subsequently developed increased eye pain, and more prominent ptosis of the right eye, the eyelid now closes the eye completely and he no longer sees double.  The patient reports no numbness or weakness on arms or legs, no change in speech or swallowing.  He does continue to have pain mainly around the right eye.  He has been to his ophthalmologist and was thought to have a right 3rd nerve palsy.  The patient comes to this office for an evaluation.  The patient has had some increase in gait instability since the onset of the double vision.  Past Medical History:    Diagnosis Date  . Asthma   . Cataracts, bilateral   . Compression fracture    BAck  . COPD (chronic obstructive pulmonary disease) ([place])   . Depression   . Diverticulosis    per colonscopy  . GERD (gastroesophageal reflux disease)   . High cholesterol   . Hypertension   . Kidney stones   . Macular degeneration   . Myocardial infarction ([place]) 2002  . 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 Osteoporosis   . Stroke [facility])     Past Surgical History:  Procedure Laterality Date  . CARDIAC CATHETERIZATION  2002,   stents   . CATARACT EXTRACTION Left 2016  . Colonscopy  2012  . HIP FRACTURE SURGERY  2008   right  . HYDROCELE EXCISION  10/12/2014  . LITHOTRIPSY     2007, 2009, 2010, 2011, 2012, 2013  . LUMBAR LAMINECTOMY/DECOMPRESSION MICRODISCECTOMY  03/15/2012   Procedure: LUMBAR LAMINECTOMY/DECOMPRESSION MICRODISCECTOMY 1 LEVEL;  Surgeon: Elaina Hoops, MD;  Location: [place] NEURO ORS;  Service: Neurosurgery;  Laterality: Left;  Left lumbar three-four decompressive lumbar laminectomy, discectomy  . MAXIMUM ACCESS (MAS)POSTERIOR LUMBAR INTERBODY FUSION (PLIF) 2 LEVEL N/A 12/08/2012   Procedure: FOR MAXIMUM ACCESS (MAS) POSTERIOR LUMBAR INTERBODY FUSION (PLIF) 2 LEVEL;  Surgeon: Elaina Hoops, MD;  Location: [place] NEURO ORS;  Service: Neurosurgery;  Laterality: N/A;  FOR MAXIMUM ACCESS (MAS) POSTERIOR LUMBAR INTERBODY FUSION (PLIF) 2 LEVEL  . ROTATOR CUFF REPAIR  1999   bil   .  [place]  . [place] study     with Ablation    Family History  Problem Relation Age of Onset  . Hemolytic uremic syndrome Mother   . Stroke Father     Social history:  reports that he quit smoking about 16 years ago. His smoking use included cigarettes. He has a 45.00 pack-year smoking history. His smokeless tobacco use includes chew. He reports that he does not drink alcohol or use drugs.  Medications:  Prior to Admission medications   Medication Sig Start Date End Date Taking?  Authorizing Provider  albuterol (PROVENTIL HFA;VENTOLIN HFA) 108 (90 BASE) MCG/ACT inhaler Inhale 2 puffs into the lungs every 6 (six) hours as needed for wheezing.   Yes [provider]  aspirin EC 81 MG tablet Take 81 mg by mouth daily.   Yes [provider]  atorvastatin (LIPITOR) 40 MG tablet Take 40 mg by mouth daily.   Yes [provider]  benzonatate (TESSALON) 200 MG capsule TAKE ONE CAPSULE BY MOUTH THREE TIMES DAILY AS NEEDED FOR COUGH 09/23/16  Yes [provider]  Calcium Carb-Cholecalciferol (CALCIUM-VITAMIN D) 500-200 MG-UNIT tablet Take 1 tablet by mouth daily.   Yes [provider]  clopidogrel (PLAVIX) 75 MG tablet Take 75 mg by mouth daily. 06/20/15  Yes [provider]  Cyanocobalamin (B-12 PO) Take 2,500 mcg by mouth daily.   Yes [provider]  donepezil (ARICEPT) 5 MG tablet Take 5 mg by mouth at bedtime.  11/05/15  Yes [provider]  Fexofenadine HCl (MUCINEX ALLERGY PO) Take 1,200 mg by mouth as needed.   Yes [provider]  HYDROcodone-acetaminophen (NORCO/VICODIN) 5-325 MG tablet Take 1 tablet by mouth every 6 (six) hours as needed for moderate pain.   Yes [provider]  IRON PO Take 27 mg by mouth daily.   Yes [provider]  loratadine (CLARITIN) 10 MG tablet Take 10 mg by mouth.   Yes [provider]  Lutein 40 MG CAPS Take 40 mg by mouth daily.   Yes [provider]  metoprolol succinate (TOPROL-XL) 25 MG 24 hr tablet Take 12.5 mg by mouth at bedtime.    Yes [provider]  montelukast (SINGULAIR) 10 MG tablet Take 10 mg by mouth at bedtime.   Yes [provider]  Multiple Vitamins-Minerals (MULTIVITAMIN PO) Take 1 tablet by mouth daily.   Yes [provider]  ondansetron (ZOFRAN) 4 MG tablet Take 4 mg by mouth every 8 (eight) hours as needed for nausea or vomiting.   Yes [provider]  ranitidine (ZANTAC) 150 MG  tablet Take 150 mg by mouth 2 (two) times daily.   Yes [provider]  rOPINIRole (REQUIP) 0.5 MG tablet Take 0.5 mg by mouth 3 (three) times daily. 09/27/16  Yes [provider]  SYMBICORT 160-4.5 MCG/ACT inhaler Inhale 2 puffs into the lungs 2 (two) times daily. 07/21/16  Yes [provider]  traMADol (ULTRAM) 50 MG tablet Take 50 mg by mouth every 6 (six) hours as needed.   Yes [provider]  Vitamin D, Ergocalciferol, (DRISDOL) 50000 units CAPS capsule Take 50,000 Units by mouth once a week.   Yes [provider]  alendronate (FOSAMAX) 70 MG tablet Take 70 mg by mouth every 7 (seven) days. Take with a full glass of water on an empty stomach. Takes on Friday.    [provider]  nitroGLYCERIN (NITROSTAT) 0.4 MG SL tablet  Place 1 tablet (0.4 mg total) under the tongue every 5 (five) minutes as needed for chest pain. 04/23/17 07/22/17  Revankar, Reita Cliche, MD      Allergies  Allergen Reactions  . Other Nausea And Vomiting and Other (See Comments)    pneumonia vaccine- chills, vomiting, fever, lost use of legs and body function. Had a fall post inj. (05/25/2013)  . Ibuprofen     Heart doctor advised he cannot take this  . Oxycodone     Mental status changes    ROS:  Out of a complete 14 system review of symptoms, the patient complains only of the following symptoms, and all other reviewed systems are negative.  Fatigue Hearing loss Difficulty swallowing Blurred vision, double vision, eye pain Shortness of breath, cough, snoring Constipation Impotence Easy bruising, easy bleeding Feeling cold Allergies, runny nose, frequent infections Memory loss, headache, weakness, difficulty swallowing, dizziness Depression, decreased energy Restless legs  Blood pressure 121/70, pulse (!) 59, height 5\' 7"  (1.702 m), weight 168 lb 8 oz (76.4 kg).  Physical Exam  General: The patient is alert and cooperative at the time of the  examination.  Eyes: Pupils are equal, round, and reactive to light. Discs are flat bilaterally.  Neck: The neck is supple, no carotid bruits are noted.  Respiratory: The respiratory examination is clear.  Cardiovascular: The cardiovascular examination reveals a regular rate and rhythm, no obvious murmurs or rubs are noted.  Skin: Extremities are with 1+ edema below the knees.  Neurologic Exam  Mental status: The patient is alert and oriented x 3 at the time of the examination. The patient has apparent normal recent and remote memory, with an apparently normal attention span and concentration ability.  Cranial nerves: Facial symmetry is not present.  Complete ptosis of the right eye is noted.  There is good sensation of the face to pinprick and soft touch bilaterally. The strength of the facial muscles and the muscles to head turning and shoulder shrug are normal bilaterally. Speech is well enunciated, no aphasia or dysarthria is noted. Extraocular movements are full on the left eye, with the right eye, the patient is able to laterally deviate the eye, he cannot bring the eye across midline medially, some restriction of superior and inferior gaze is noted. Visual fields are full. The tongue is midline, and the patient has symmetric elevation of the soft palate. No obvious hearing deficits are noted.  Motor: The motor testing reveals 5 over 5 strength of all 4 extremities. Good symmetric motor tone is noted throughout.  Sensory: Sensory testing is intact to pinprick, soft touch, vibration sensation, and position sense on all 4 extremities. No evidence of extinction is noted.  Coordination: Cerebellar testing reveals good finger-nose-finger and heel-to-shin bilaterally.  The patient has some difficulty with elevation of the right arm at the shoulder.  Gait and station: Gait is wide-based, slightly unsteady.  Tandem gait was not attempted.  Romberg is negative.  Reflexes: Deep tendon reflexes  are symmetric and normal bilaterally. Toes are downgoing bilaterally.   Assessment/Plan:  1.  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  The patient has pupillary sparing, with no evidence of external compression of the ocular motor nerve.  MRA of the head is probably not indicated, an aneurysm or tumor large enough to compress the 3rd nerve would be visible by MRI of the brain.  The lack of pupillary dilation suggests  an ischemic infarct of the 3rd nerve.  The patient was sent for further blood work today.  He has hydrocodone to take for the eye pain, this should improve over the next several weeks.  Full healing may take anywhere from 3 to 5 months.  The patient will follow-up in 4 months.  Jill Alexanders MD 10/07/2017 11:38 AM  [facility] [address] [place], [place] [address]  Phone [phone] Fax [phone]

## 2017-10-09 LAB — SEDIMENTATION RATE: SED RATE: 7 mm/h (ref 0–30)

## 2017-10-09 LAB — ACETYLCHOLINE RECEPTOR, BINDING: AChR Binding Ab, Serum: <0.03 nmol/L (ref 0.00–0.24)

## 2017-10-09 LAB — VITAMIN B12: Vitamin B-12: >2000 pg/mL — ABNORMAL HIGH (ref 232–1245)

## 2017-10-09 LAB — ANA W/REFLEX: ANA: NEGATIVE

## 2017-10-09 LAB — TSH: TSH: 1.26 u[IU]/mL (ref 0.450–4.500)

## 2017-10-09 LAB — B. BURGDORFI ANTIBODIES: Lyme IgG/IgM Ab: <0.91 {ISR} (ref 0.00–0.90)

## 2017-10-09 LAB — ANGIOTENSIN CONVERTING ENZYME: Angio Convert Enzyme: 50 U/L (ref 14–82)

## 2017-10-27 ENCOUNTER — Ambulatory Visit: Payer: PPO | Admitting: Neurology

## 2017-10-28 DIAGNOSIS — H4901 Third [oculomotor] nerve palsy, right eye: Secondary | ICD-10-CM | POA: Diagnosis not present

## 2017-10-28 DIAGNOSIS — H532 Diplopia: Secondary | ICD-10-CM | POA: Diagnosis not present

## 2017-12-03 ENCOUNTER — Ambulatory Visit: Payer: PPO | Admitting: Cardiology

## 2017-12-08 DIAGNOSIS — E785 Hyperlipidemia, unspecified: Secondary | ICD-10-CM | POA: Diagnosis not present

## 2017-12-08 DIAGNOSIS — I10 Essential (primary) hypertension: Secondary | ICD-10-CM | POA: Diagnosis not present

## 2017-12-08 DIAGNOSIS — H4900 Third [oculomotor] nerve palsy, unspecified eye: Secondary | ICD-10-CM | POA: Diagnosis not present

## 2017-12-08 DIAGNOSIS — F0390 Unspecified dementia without behavioral disturbance: Secondary | ICD-10-CM | POA: Diagnosis not present

## 2017-12-08 DIAGNOSIS — Z23 Encounter for immunization: Secondary | ICD-10-CM | POA: Diagnosis not present

## 2017-12-08 DIAGNOSIS — Z9181 History of falling: Secondary | ICD-10-CM | POA: Diagnosis not present

## 2017-12-08 DIAGNOSIS — Z6825 Body mass index (BMI) 25.0-25.9, adult: Secondary | ICD-10-CM | POA: Diagnosis not present

## 2017-12-08 DIAGNOSIS — Z1331 Encounter for screening for depression: Secondary | ICD-10-CM | POA: Diagnosis not present

## 2017-12-08 DIAGNOSIS — I2510 Atherosclerotic heart disease of native coronary artery without angina pectoris: Secondary | ICD-10-CM | POA: Diagnosis not present

## 2017-12-09 DIAGNOSIS — H532 Diplopia: Secondary | ICD-10-CM | POA: Diagnosis not present

## 2017-12-10 ENCOUNTER — Telehealth: Payer: Self-pay | Admitting: Neurology

## 2017-12-10 NOTE — Telephone Encounter (Signed)
Blood work done from [employer] care physician on 08 December 2017:  White blood count 9.1, hemoglobin 15.1, hematocrit 44.0, MCV of 92, platelets of 198.  Glucose of 88, BUN of 12, creatinine of 1.09, estimated GFR of 64, sodium 144, potassium 3.9, chloride 103, CO2 25, calcium 9.7, albumin of 4.1, total protein 6.5, liver profile is unremarkable.  Total cholesterol of 156, triglycerides 130, HDL 39, VLDL of 26, LDL of 91.

## 2017-12-22 ENCOUNTER — Ambulatory Visit (INDEPENDENT_AMBULATORY_CARE_PROVIDER_SITE_OTHER): Payer: PPO | Admitting: Cardiology

## 2017-12-22 ENCOUNTER — Encounter: Payer: Self-pay | Admitting: Cardiology

## 2017-12-22 VITALS — BP 120/60 | HR 54 | Ht 67.0 in | Wt 170.0 lb

## 2017-12-22 DIAGNOSIS — I639 Cerebral infarction, unspecified: Secondary | ICD-10-CM | POA: Diagnosis not present

## 2017-12-22 DIAGNOSIS — I2510 Atherosclerotic heart disease of native coronary artery without angina pectoris: Secondary | ICD-10-CM

## 2017-12-22 DIAGNOSIS — E785 Hyperlipidemia, unspecified: Secondary | ICD-10-CM | POA: Diagnosis not present

## 2017-12-22 NOTE — Patient Instructions (Signed)
Medication Instructions:  Your physician recommends that you continue on your current medications as directed. Please refer to the Current Medication list given to you today.  If you need a refill on your cardiac medications before your next appointment, please call your pharmacy.   Lab work: None  If you have labs (blood work) drawn today and your tests are completely normal, you will receive your results only by: . MyChart Message (if you have MyChart) OR . A paper copy in the mail If you have any lab test that is abnormal or we need to change your treatment, we will call you to review the results.  Testing/Procedures: None  Follow-Up: At [facility], you and your health needs are our priority.  As part of our continuing mission to provide you with exceptional heart care, we have created designated Provider Care Teams.  These Care Teams include your primary Cardiologist (physician) and Advanced Practice Providers (APPs -  Physician Assistants and Nurse Practitioners) who all work together to provide you with the care you need, when you need it.  You will need a follow up appointment in 6 months.  Please call our office 2 months in advance to schedule this appointment.  You may see another member of our [facility] Provider Team in [place]: Robert Krasowski, MD . Brian Munley, MD  Any Other Special Instructions Will Be Listed Below (If Applicable).    

## 2017-12-22 NOTE — Progress Notes (Signed)
Cardiology Office Note:    Date:  12/22/2017   ID:  Chris Braun, DOB 1935/05/27, MRN [id number]  PCP:  [name]., MD  Cardiologist:  Jenean Lindau, MD   Referring MD: [name]., MD    ASSESSMENT:    1. Coronary artery disease involving native coronary artery of native heart without angina pectoris   2. Cerebrovascular accident (CVA), unspecified mechanism ([place])   3. Dyslipidemia    PLAN:    In order of problems listed above:  1. Secondary prevention stressed with patient.  Importance of compliance with diet and medication stressed and he vocalized understanding.  His blood pressure is stable.  Diet was discussed with dyslipidemia 2. He will have his blood work sent by his primary care physician when his lipids are checked 3. Patient will be seen in follow-up appointment in 6 months or earlier if the patient has any concerns    Medication Adjustments/Labs and Tests Ordered: Current medicines are reviewed at length with the patient today.  Concerns regarding medicines are outlined above.  No orders of the defined types were placed in this encounter.  No orders of the defined types were placed in this encounter.    No chief complaint on file.    History of Present Illness:    Chris Braun is a 82 y.o. male.  Patient has known coronary artery disease he denies any problems at this time and takes care of activities of daily living.  No chest pain orthopnea or PND.  His diplopia is getting better and he is ambulating the best of his ability in age appropriately.  At the time of my evaluation, the patient is alert awake oriented and in no distress.  His primary care physician does his blood work including lipids.  Past Medical History:  Diagnosis Date  . Asthma   . Cataracts, bilateral   . Compression fracture    BAck  . COPD (chronic obstructive pulmonary disease) ([place])   . Depression   . Diverticulosis    per colonscopy  . GERD (gastroesophageal reflux  disease)   . High cholesterol   . Hypertension   . Kidney stones   . Macular degeneration   . Myocardial infarction ([place]) 2002  . 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 Osteoporosis   . Stroke [facility])     Past Surgical History:  Procedure Laterality Date  . CARDIAC CATHETERIZATION  2002,   stents   . CATARACT EXTRACTION Left 2016  . Colonscopy  2012  . HIP FRACTURE SURGERY  2008   right  . HYDROCELE EXCISION  10/12/2014  . LITHOTRIPSY     2007, 2009, 2010, 2011, 2012, 2013  . LUMBAR LAMINECTOMY/DECOMPRESSION MICRODISCECTOMY  03/15/2012   Procedure: LUMBAR LAMINECTOMY/DECOMPRESSION MICRODISCECTOMY 1 LEVEL;  Surgeon: Elaina Hoops, MD;  Location: [place] NEURO ORS;  Service: Neurosurgery;  Laterality: Left;  Left lumbar three-four decompressive lumbar laminectomy, discectomy  . MAXIMUM ACCESS (MAS)POSTERIOR LUMBAR INTERBODY FUSION (PLIF) 2 LEVEL N/A 12/08/2012   Procedure: FOR MAXIMUM ACCESS (MAS) POSTERIOR LUMBAR INTERBODY FUSION (PLIF) 2 LEVEL;  Surgeon: Elaina Hoops, MD;  Location: [place] NEURO ORS;  Service: Neurosurgery;  Laterality: N/A;  FOR MAXIMUM ACCESS (MAS) POSTERIOR LUMBAR INTERBODY FUSION (PLIF) 2 LEVEL  . ROTATOR CUFF REPAIR  1999   bil   . [place]  . VT study     with Ablation    Current Medications: Current Meds  Medication Sig  .  albuterol (PROVENTIL HFA;VENTOLIN HFA) 108 (90 BASE) MCG/ACT inhaler Inhale 2 puffs into the lungs every 6 (six) hours as needed for wheezing.  Marland Kitchen alendronate (FOSAMAX) 70 MG tablet Take 70 mg by mouth every 7 (seven) days. Take with a full glass of water on an empty stomach. Takes on Friday.  Marland Kitchen aspirin EC 81 MG tablet Take 81 mg by mouth daily.  Marland Kitchen atorvastatin (LIPITOR) 40 MG tablet Take 40 mg by mouth daily.  . benzonatate (TESSALON) 200 MG capsule TAKE ONE CAPSULE BY MOUTH THREE TIMES DAILY AS NEEDED FOR COUGH  . Calcium Carb-Cholecalciferol (CALCIUM-VITAMIN D) 500-200 MG-UNIT tablet Take 1 tablet by mouth  daily.  . clopidogrel (PLAVIX) 75 MG tablet Take 75 mg by mouth daily.  . Cyanocobalamin (B-12 PO) Take 2,500 mcg by mouth daily.  Marland Kitchen donepezil (ARICEPT) 5 MG tablet Take 5 mg by mouth at bedtime.   Marland Kitchen Fexofenadine HCl (MUCINEX ALLERGY PO) Take 1,200 mg by mouth as needed.  Marland Kitchen HYDROcodone-acetaminophen (NORCO/VICODIN) 5-325 MG tablet Take 1 tablet by mouth every 6 (six) hours as needed for moderate pain.  . IRON PO Take 27 mg by mouth daily.  Marland Kitchen loratadine (CLARITIN) 10 MG tablet Take 10 mg by mouth.  . Lutein 40 MG CAPS Take 40 mg by mouth daily.  . metoprolol succinate (TOPROL-XL) 25 MG 24 hr tablet Take 12.5 mg by mouth at bedtime.   . montelukast (SINGULAIR) 10 MG tablet Take 10 mg by mouth at bedtime.  . Multiple Vitamins-Minerals (MULTIVITAMIN PO) Take 1 tablet by mouth daily.  . ondansetron (ZOFRAN) 4 MG tablet Take 4 mg by mouth every 8 (eight) hours as needed for nausea or vomiting.  . ranitidine (ZANTAC) 150 MG tablet Take 150 mg by mouth 2 (two) times daily.  Marland Kitchen rOPINIRole (REQUIP) 0.5 MG tablet Take 0.5 mg by mouth 3 (three) times daily.  . SYMBICORT 160-4.5 MCG/ACT inhaler Inhale 2 puffs into the lungs 2 (two) times daily.  . traMADol (ULTRAM) 50 MG tablet Take 50 mg by mouth every 6 (six) hours as needed.  . Vitamin D, Ergocalciferol, (DRISDOL) 50000 units CAPS capsule Take 50,000 Units by mouth once a week.     Allergies:   Other; Ibuprofen; and Oxycodone   Social History   Socioeconomic History  . Marital status: Married    Spouse name: Murad Staples  . Number of children: 4  . Years of education: 46  . Highest education level: Not on file  Occupational History  . Occupation: Retired  Scientific laboratory technician  . Financial resource strain: Not on file  . Food insecurity:    Worry: Not on file    Inability: Not on file  . Transportation needs:    Medical: Not on file    Non-medical: Not on file  Tobacco Use  . Smoking status: Former Smoker    Packs/day: 1.00    Years: 45.00     Pack years: 45.00    Types: Cigarettes    Last attempt to quit: 02/02/2001    Years since quitting: 16.8  . Smokeless tobacco: Current User    Types: Chew  Substance and Sexual Activity  . Alcohol use: No  . Drug use: No  . Sexual activity: Not on file  Lifestyle  . Physical activity:    Days per week: Not on file    Minutes per session: Not on file  . Stress: Not on file  Relationships  . Social connections:    Talks on phone: Not on  file    Gets together: Not on file    Attends religious service: Not on file    Active member of club or organization: Not on file    Attends meetings of clubs or organizations: Not on file    Relationship status: Not on file  Other Topics Concern  . Not on file  Social History Narrative   Lives with wife   Caffeine use: coffee, tea     Family History: The patient's family history includes Hemolytic uremic syndrome in his mother; Stroke in his father.  ROS:   Please see the history of present illness.    All other systems reviewed and are negative.  EKGs/Labs/Other Studies Reviewed:    The following studies were reviewed today: I discussed my findings with the patient at extensive length.   Recent Labs: 10/07/2017: TSH 1.260  Recent Lipid Panel No results found for: CHOL, TRIG, HDL, CHOLHDL, VLDL, LDLCALC, LDLDIRECT  Physical Exam:    VS:  BP 120/60 (BP Location: Right Arm, Patient Position: Sitting, Cuff Size: Normal)   Pulse (!) 54   Ht 5\' 7"  (1.702 m)   Wt 170 lb (77.1 kg)   SpO2 96%   BMI 26.63 kg/m     Wt Readings from Last 3 Encounters:  12/22/17 170 lb (77.1 kg)  10/07/17 168 lb 8 oz (76.4 kg)  04/23/17 166 lb (75.3 kg)     GEN: Patient is in no acute distress HEENT: Normal NECK: No JVD; No carotid bruits LYMPHATICS: No lymphadenopathy CARDIAC: Hear sounds regular, 2/6 systolic murmur at the apex. RESPIRATORY:  Clear to auscultation without rales, wheezing or rhonchi  ABDOMEN: Soft, non-tender,  non-distended MUSCULOSKELETAL:  No edema; No deformity  SKIN: Warm and dry NEUROLOGIC:  Alert and oriented x 3 PSYCHIATRIC:  Normal affect   Signed, Jenean Lindau, MD  12/22/2017 2:35 PM    [facility]

## 2018-01-19 ENCOUNTER — Ambulatory Visit: Payer: PPO | Admitting: Neurology

## 2018-01-19 ENCOUNTER — Ambulatory Visit (INDEPENDENT_AMBULATORY_CARE_PROVIDER_SITE_OTHER): Payer: PPO | Admitting: Neurology

## 2018-01-19 ENCOUNTER — Encounter: Payer: Self-pay | Admitting: Neurology

## 2018-01-19 ENCOUNTER — Other Ambulatory Visit: Payer: Self-pay

## 2018-01-19 VITALS — BP 128/72 | HR 70 | Resp 20 | Ht 67.0 in | Wt 173.0 lb

## 2018-01-19 DIAGNOSIS — H4901 Third [oculomotor] nerve palsy, right eye: Secondary | ICD-10-CM

## 2018-01-19 DIAGNOSIS — R269 Unspecified abnormalities of gait and mobility: Secondary | ICD-10-CM | POA: Diagnosis not present

## 2018-01-19 DIAGNOSIS — R413 Other amnesia: Secondary | ICD-10-CM | POA: Diagnosis not present

## 2018-01-19 HISTORY — DX: Other amnesia: R41.3

## 2018-01-19 HISTORY — DX: Unspecified abnormalities of gait and mobility: R26.9

## 2018-01-19 NOTE — Progress Notes (Signed)
Reason for visit: 3rd nerve palsy  Chris Braun is an 82 y.o. male  History of present illness:  Chris Braun is a 82 year old right-handed white male with a history of cerebrovascular disease with prior stroke events and right-sided weakness, he has a gait abnormality associated with severe diffuse cerebrovascular disease.  The patient was seen on 07 October 2017 with a right 3rd nerve palsy.  This has improved significantly, the patient is not having any further ptosis or double vision.  He does have chronic issues with gait instability, he has not had any recent falls.  He does have a memory disturbance, he has been on Aricept for a number of years in low-dose.  He tolerates this well.  As long as he uses his walker, his gait stability is good.  He returns to this office for an evaluation.  The patient does report some problems with swallowing solids at times, he may get choked.  Liquids do not cause as much of an issue for him.  Past Medical History:  Diagnosis Date  . Asthma   . Cataracts, bilateral   . Compression fracture    BAck  . COPD (chronic obstructive pulmonary disease) ([place])   . Depression   . Diverticulosis    per colonscopy  . Gait abnormality 01/19/2018  . GERD (gastroesophageal reflux disease)   . High cholesterol   . Hypertension   . Kidney stones   . Macular degeneration   . Memory disorder 01/19/2018  . Myocardial infarction ([place]) 2002  . 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 Osteoporosis   . Stroke [facility])     Past Surgical History:  Procedure Laterality Date  . CARDIAC CATHETERIZATION  2002,   stents   . CATARACT EXTRACTION Left 2016  . Colonscopy  2012  . HIP FRACTURE SURGERY  2008   right  . HYDROCELE EXCISION  10/12/2014  . LITHOTRIPSY     2007, 2009, 2010, 2011, 2012, 2013  . LUMBAR LAMINECTOMY/DECOMPRESSION MICRODISCECTOMY  03/15/2012   Procedure: LUMBAR LAMINECTOMY/DECOMPRESSION MICRODISCECTOMY 1 LEVEL;  Surgeon: Elaina Hoops, MD;   Location: [facility];  Service: Neurosurgery;  Laterality: Left;  Left lumbar three-four decompressive lumbar laminectomy, discectomy  . MAXIMUM ACCESS (MAS)POSTERIOR LUMBAR INTERBODY FUSION (PLIF) 2 LEVEL N/A 12/08/2012   Procedure: FOR MAXIMUM ACCESS (MAS) POSTERIOR LUMBAR INTERBODY FUSION (PLIF) 2 LEVEL;  Surgeon: Elaina Hoops, MD;  Location: [facility];  Service: Neurosurgery;  Laterality: N/A;  FOR MAXIMUM ACCESS (MAS) POSTERIOR LUMBAR INTERBODY FUSION (PLIF) 2 LEVEL  . ROTATOR CUFF REPAIR  1999   bil   . [place]  . VT study     with Ablation    Family History  Problem Relation Age of Onset  . Hemolytic uremic syndrome Mother   . Stroke Father     Social history:  reports that he quit smoking about 16 years ago. His smoking use included cigarettes. He has a 45.00 pack-year smoking history. His smokeless tobacco use includes chew. He reports that he does not drink alcohol or use drugs.    Allergies  Allergen Reactions  . Other Nausea And Vomiting and Other (See Comments)    pneumonia vaccine- chills, vomiting, fever, lost use of legs and body function. Had a fall post inj. (05/25/2013)  . Ibuprofen     Heart doctor advised he cannot take this  . Oxycodone     Mental status changes  Medications:  Prior to Admission medications   Medication Sig Start Date End Date Taking? Authorizing Provider  albuterol (PROVENTIL HFA;VENTOLIN HFA) 108 (90 BASE) MCG/ACT inhaler Inhale 2 puffs into the lungs every 6 (six) hours as needed for wheezing.   Yes [provider]  alendronate (FOSAMAX) 70 MG tablet Take 70 mg by mouth every 7 (seven) days. Take with a full glass of water on an empty stomach. Takes on Friday.   Yes [provider]  aspirin EC 81 MG tablet Take 81 mg by mouth daily.   Yes [provider]  atorvastatin (LIPITOR) 40 MG tablet Take 40 mg by mouth daily.   Yes [provider]  benzonatate (TESSALON) 200 MG  capsule TAKE ONE CAPSULE BY MOUTH THREE TIMES DAILY AS NEEDED FOR COUGH 09/23/16  Yes [provider]  Calcium Carb-Cholecalciferol (CALCIUM-VITAMIN D) 500-200 MG-UNIT tablet Take 1 tablet by mouth daily.   Yes [provider]  clopidogrel (PLAVIX) 75 MG tablet Take 75 mg by mouth daily. 06/20/15  Yes [provider]  Cyanocobalamin (B-12 PO) Take 2,500 mcg by mouth daily.   Yes [provider]  donepezil (ARICEPT) 5 MG tablet Take 5 mg by mouth at bedtime.  11/05/15  Yes [provider]  Fexofenadine HCl (MUCINEX ALLERGY PO) Take 1,200 mg by mouth as needed.   Yes [provider]  HYDROcodone-acetaminophen (NORCO/VICODIN) 5-325 MG tablet Take 1 tablet by mouth every 6 (six) hours as needed for moderate pain.   Yes [provider]  IRON PO Take 27 mg by mouth daily.   Yes [provider]  loratadine (CLARITIN) 10 MG tablet Take 10 mg by mouth.   Yes [provider]  Lutein 40 MG CAPS Take 40 mg by mouth daily.   Yes [provider]  metoprolol succinate (TOPROL-XL) 25 MG 24 hr tablet Take 12.5 mg by mouth at bedtime.    Yes [provider]  montelukast (SINGULAIR) 10 MG tablet Take 10 mg by mouth at bedtime.   Yes [provider]  Multiple Vitamins-Minerals (MULTIVITAMIN PO) Take 1 tablet by mouth daily.   Yes [provider]  ondansetron (ZOFRAN) 4 MG tablet Take 4 mg by mouth every 8 (eight) hours as needed for nausea or vomiting.   Yes [provider]  ranitidine (ZANTAC) 150 MG tablet Take 150 mg by mouth 2 (two) times daily.   Yes [provider]  rOPINIRole (REQUIP) 0.5 MG tablet Take 0.5 mg by mouth 3 (three) times daily. 09/27/16  Yes [provider]  SYMBICORT 160-4.5 MCG/ACT inhaler Inhale 2 puffs into the lungs 2 (two) times daily. 07/21/16  Yes [provider]  traMADol (ULTRAM) 50 MG tablet Take 50 mg by mouth every 6 (six) hours as needed.    Yes [provider]  Vitamin D, Ergocalciferol, (DRISDOL) 50000 units CAPS capsule Take 50,000 Units by mouth once a week.   Yes [provider]  nitroGLYCERIN (NITROSTAT) 0.4 MG SL tablet Place 1 tablet (0.4 mg total) under the tongue every 5 (five) minutes as needed for chest pain. 04/23/17 07/22/17  Revankar, Reita Cliche, MD    ROS:  Out of a complete 14 system review of symptoms, the patient complains only of the following symptoms, and all other reviewed systems are negative.  Difficulty swallowing  Blood pressure 128/72, pulse 70, resp. rate 20, height 5\' 7"  (1.702 m), weight 173 lb (78.5 kg).  Physical Exam  General: The patient is alert and  cooperative at the time of the examination.   Neuromuscular: The patient is able to elevate the left arm normally, he can elevate the right arm only to about 75 degrees.  Skin: No significant peripheral edema is noted.   Neurologic Exam  Mental status: The patient is alert and oriented x 3 at the time of the examination. The Mini-Mental status examination done today shows a total score of 21/30.  The patient is able to name 10 four-legged animals in 1 minute.   Cranial nerves: Facial symmetry is present. Speech is normal, no aphasia or dysarthria is noted. Extraocular movements are full. Visual fields are full.  Motor: The patient has good strength in all 4 extremities, with exception of some decreased grip with the right arm, difficulty with elevation of the right arm.  Sensory examination: Soft touch sensation is symmetric on the face, arms, and legs.  Coordination: The patient has good finger-nose-finger and heel-to-shin bilaterally.  Gait and station: The patient has a wide-based, unstable gait.  With the walker, he is able to walk with good stride and he has good stability and good turns.  Romberg is negative.  Reflexes: Deep tendon reflexes are symmetric.   Assessment/Plan:  1.  Right 3rd nerve palsy,  resolved  2.  Cerebrovascular disease  3.  Memory disturbance  4.  Gait disturbance  5.  Reports of dysphagia  The patient will contact me feels that the swallowing issue has worsened, and we will consider a modified barium swallow.  The patient will be followed over time for his memory issue, he will follow-up in about 8 months.  The will continue the low-dose donepezil.  Jill Alexanders MD 01/19/2018 4:20 PM  [facility] [address] [place], [place] [address]  Phone [phone] Fax [phone]

## 2018-02-01 DIAGNOSIS — Z6826 Body mass index (BMI) 26.0-26.9, adult: Secondary | ICD-10-CM | POA: Diagnosis not present

## 2018-02-01 DIAGNOSIS — J189 Pneumonia, unspecified organism: Secondary | ICD-10-CM | POA: Diagnosis not present

## 2018-02-01 DIAGNOSIS — M545 Low back pain: Secondary | ICD-10-CM | POA: Diagnosis not present

## 2018-02-01 DIAGNOSIS — J449 Chronic obstructive pulmonary disease, unspecified: Secondary | ICD-10-CM | POA: Diagnosis not present

## 2018-02-01 DIAGNOSIS — D649 Anemia, unspecified: Secondary | ICD-10-CM | POA: Diagnosis not present

## 2018-02-01 DIAGNOSIS — R05 Cough: Secondary | ICD-10-CM | POA: Diagnosis not present

## 2018-02-05 DIAGNOSIS — B9689 Other specified bacterial agents as the cause of diseases classified elsewhere: Secondary | ICD-10-CM | POA: Diagnosis not present

## 2018-02-05 DIAGNOSIS — S22060A Wedge compression fracture of T7-T8 vertebra, initial encounter for closed fracture: Secondary | ICD-10-CM | POA: Diagnosis not present

## 2018-02-05 DIAGNOSIS — R0781 Pleurodynia: Secondary | ICD-10-CM | POA: Diagnosis not present

## 2018-02-05 DIAGNOSIS — J069 Acute upper respiratory infection, unspecified: Secondary | ICD-10-CM | POA: Diagnosis not present

## 2018-02-05 DIAGNOSIS — N200 Calculus of kidney: Secondary | ICD-10-CM | POA: Diagnosis not present

## 2018-02-05 DIAGNOSIS — I714 Abdominal aortic aneurysm, without rupture: Secondary | ICD-10-CM | POA: Diagnosis not present

## 2018-02-05 DIAGNOSIS — R0609 Other forms of dyspnea: Secondary | ICD-10-CM | POA: Diagnosis not present

## 2018-02-08 DIAGNOSIS — E785 Hyperlipidemia, unspecified: Secondary | ICD-10-CM | POA: Diagnosis not present

## 2018-02-08 DIAGNOSIS — S22069A Unspecified fracture of T7-T8 vertebra, initial encounter for closed fracture: Secondary | ICD-10-CM | POA: Diagnosis not present

## 2018-02-08 DIAGNOSIS — S22060A Wedge compression fracture of T7-T8 vertebra, initial encounter for closed fracture: Secondary | ICD-10-CM | POA: Diagnosis not present

## 2018-02-08 DIAGNOSIS — J449 Chronic obstructive pulmonary disease, unspecified: Secondary | ICD-10-CM | POA: Diagnosis not present

## 2018-02-08 DIAGNOSIS — I2510 Atherosclerotic heart disease of native coronary artery without angina pectoris: Secondary | ICD-10-CM | POA: Diagnosis not present

## 2018-02-08 DIAGNOSIS — D649 Anemia, unspecified: Secondary | ICD-10-CM | POA: Diagnosis not present

## 2018-02-08 DIAGNOSIS — Z6827 Body mass index (BMI) 27.0-27.9, adult: Secondary | ICD-10-CM | POA: Diagnosis not present

## 2018-02-08 DIAGNOSIS — F0390 Unspecified dementia without behavioral disturbance: Secondary | ICD-10-CM | POA: Diagnosis not present

## 2018-02-08 DIAGNOSIS — X58XXXA Exposure to other specified factors, initial encounter: Secondary | ICD-10-CM | POA: Diagnosis not present

## 2018-02-10 DIAGNOSIS — G8929 Other chronic pain: Secondary | ICD-10-CM | POA: Diagnosis not present

## 2018-02-10 DIAGNOSIS — M546 Pain in thoracic spine: Secondary | ICD-10-CM | POA: Diagnosis not present

## 2018-02-12 DIAGNOSIS — J449 Chronic obstructive pulmonary disease, unspecified: Secondary | ICD-10-CM | POA: Diagnosis not present

## 2018-02-12 DIAGNOSIS — Z79899 Other long term (current) drug therapy: Secondary | ICD-10-CM | POA: Diagnosis not present

## 2018-02-12 DIAGNOSIS — K449 Diaphragmatic hernia without obstruction or gangrene: Secondary | ICD-10-CM | POA: Diagnosis not present

## 2018-02-12 DIAGNOSIS — Z7982 Long term (current) use of aspirin: Secondary | ICD-10-CM | POA: Diagnosis not present

## 2018-02-12 DIAGNOSIS — I10 Essential (primary) hypertension: Secondary | ICD-10-CM | POA: Diagnosis not present

## 2018-02-12 DIAGNOSIS — E785 Hyperlipidemia, unspecified: Secondary | ICD-10-CM | POA: Diagnosis not present

## 2018-02-12 DIAGNOSIS — S22000A Wedge compression fracture of unspecified thoracic vertebra, initial encounter for closed fracture: Secondary | ICD-10-CM | POA: Diagnosis not present

## 2018-02-12 DIAGNOSIS — N200 Calculus of kidney: Secondary | ICD-10-CM | POA: Diagnosis not present

## 2018-02-12 DIAGNOSIS — Z7902 Long term (current) use of antithrombotics/antiplatelets: Secondary | ICD-10-CM | POA: Diagnosis not present

## 2018-02-12 DIAGNOSIS — S22060A Wedge compression fracture of T7-T8 vertebra, initial encounter for closed fracture: Secondary | ICD-10-CM | POA: Diagnosis not present

## 2018-02-18 DIAGNOSIS — M818 Other osteoporosis without current pathological fracture: Secondary | ICD-10-CM | POA: Diagnosis not present

## 2018-02-18 DIAGNOSIS — Z6826 Body mass index (BMI) 26.0-26.9, adult: Secondary | ICD-10-CM | POA: Diagnosis not present

## 2018-02-18 DIAGNOSIS — Z79899 Other long term (current) drug therapy: Secondary | ICD-10-CM | POA: Diagnosis not present

## 2018-02-18 DIAGNOSIS — S22060A Wedge compression fracture of T7-T8 vertebra, initial encounter for closed fracture: Secondary | ICD-10-CM | POA: Diagnosis not present

## 2018-02-26 DIAGNOSIS — M4854XA Collapsed vertebra, not elsewhere classified, thoracic region, initial encounter for fracture: Secondary | ICD-10-CM | POA: Diagnosis not present

## 2018-02-26 DIAGNOSIS — S22060A Wedge compression fracture of T7-T8 vertebra, initial encounter for closed fracture: Secondary | ICD-10-CM | POA: Diagnosis not present

## 2018-03-10 ENCOUNTER — Other Ambulatory Visit: Payer: Self-pay | Admitting: Interventional Radiology

## 2018-03-10 ENCOUNTER — Telehealth: Payer: Self-pay

## 2018-03-10 DIAGNOSIS — S22060A Wedge compression fracture of T7-T8 vertebra, initial encounter for closed fracture: Secondary | ICD-10-CM

## 2018-03-15 DIAGNOSIS — J189 Pneumonia, unspecified organism: Secondary | ICD-10-CM | POA: Diagnosis not present

## 2018-03-15 DIAGNOSIS — Z8673 Personal history of transient ischemic attack (TIA), and cerebral infarction without residual deficits: Secondary | ICD-10-CM | POA: Diagnosis not present

## 2018-03-15 DIAGNOSIS — F0390 Unspecified dementia without behavioral disturbance: Secondary | ICD-10-CM | POA: Diagnosis not present

## 2018-03-15 DIAGNOSIS — M818 Other osteoporosis without current pathological fracture: Secondary | ICD-10-CM | POA: Diagnosis not present

## 2018-03-15 DIAGNOSIS — J449 Chronic obstructive pulmonary disease, unspecified: Secondary | ICD-10-CM | POA: Diagnosis not present

## 2018-03-15 DIAGNOSIS — I2510 Atherosclerotic heart disease of native coronary artery without angina pectoris: Secondary | ICD-10-CM | POA: Diagnosis not present

## 2018-03-15 DIAGNOSIS — I10 Essential (primary) hypertension: Secondary | ICD-10-CM | POA: Diagnosis not present

## 2018-03-15 DIAGNOSIS — E785 Hyperlipidemia, unspecified: Secondary | ICD-10-CM | POA: Diagnosis not present

## 2018-03-15 DIAGNOSIS — S22060A Wedge compression fracture of T7-T8 vertebra, initial encounter for closed fracture: Secondary | ICD-10-CM | POA: Diagnosis not present

## 2018-03-15 DIAGNOSIS — Z6825 Body mass index (BMI) 25.0-25.9, adult: Secondary | ICD-10-CM | POA: Diagnosis not present

## 2018-03-29 DIAGNOSIS — J4540 Moderate persistent asthma, uncomplicated: Secondary | ICD-10-CM | POA: Diagnosis not present

## 2018-03-29 DIAGNOSIS — R5383 Other fatigue: Secondary | ICD-10-CM | POA: Diagnosis not present

## 2018-03-29 DIAGNOSIS — J301 Allergic rhinitis due to pollen: Secondary | ICD-10-CM | POA: Diagnosis not present

## 2018-04-15 ENCOUNTER — Ambulatory Visit: Payer: PPO | Admitting: Cardiology

## 2018-04-15 DIAGNOSIS — R1031 Right lower quadrant pain: Secondary | ICD-10-CM | POA: Diagnosis not present

## 2018-04-15 DIAGNOSIS — R319 Hematuria, unspecified: Secondary | ICD-10-CM | POA: Diagnosis not present

## 2018-04-15 DIAGNOSIS — R1011 Right upper quadrant pain: Secondary | ICD-10-CM | POA: Diagnosis not present

## 2018-04-15 DIAGNOSIS — N200 Calculus of kidney: Secondary | ICD-10-CM | POA: Diagnosis not present

## 2018-04-15 DIAGNOSIS — N433 Hydrocele, unspecified: Secondary | ICD-10-CM | POA: Diagnosis not present

## 2018-04-15 DIAGNOSIS — R339 Retention of urine, unspecified: Secondary | ICD-10-CM | POA: Diagnosis not present

## 2018-04-15 DIAGNOSIS — N401 Enlarged prostate with lower urinary tract symptoms: Secondary | ICD-10-CM | POA: Diagnosis not present

## 2018-04-15 DIAGNOSIS — Z79899 Other long term (current) drug therapy: Secondary | ICD-10-CM | POA: Diagnosis not present

## 2018-04-21 DIAGNOSIS — N401 Enlarged prostate with lower urinary tract symptoms: Secondary | ICD-10-CM | POA: Diagnosis not present

## 2018-04-21 DIAGNOSIS — N200 Calculus of kidney: Secondary | ICD-10-CM | POA: Diagnosis not present

## 2018-04-26 DIAGNOSIS — R5383 Other fatigue: Secondary | ICD-10-CM | POA: Diagnosis not present

## 2018-04-26 DIAGNOSIS — R918 Other nonspecific abnormal finding of lung field: Secondary | ICD-10-CM | POA: Diagnosis not present

## 2018-04-26 DIAGNOSIS — R05 Cough: Secondary | ICD-10-CM | POA: Diagnosis not present

## 2018-04-26 DIAGNOSIS — J4540 Moderate persistent asthma, uncomplicated: Secondary | ICD-10-CM | POA: Diagnosis not present

## 2018-04-26 DIAGNOSIS — R0600 Dyspnea, unspecified: Secondary | ICD-10-CM | POA: Diagnosis not present

## 2018-04-26 DIAGNOSIS — K5900 Constipation, unspecified: Secondary | ICD-10-CM | POA: Diagnosis not present

## 2018-04-26 DIAGNOSIS — J4531 Mild persistent asthma with (acute) exacerbation: Secondary | ICD-10-CM | POA: Diagnosis not present

## 2018-04-26 DIAGNOSIS — J301 Allergic rhinitis due to pollen: Secondary | ICD-10-CM | POA: Diagnosis not present

## 2018-04-27 ENCOUNTER — Ambulatory Visit (INDEPENDENT_AMBULATORY_CARE_PROVIDER_SITE_OTHER): Payer: PPO | Admitting: Cardiology

## 2018-04-27 ENCOUNTER — Encounter: Payer: Self-pay | Admitting: Cardiology

## 2018-04-27 VITALS — BP 126/58 | HR 78 | Ht 67.0 in | Wt 163.0 lb

## 2018-04-27 DIAGNOSIS — I2510 Atherosclerotic heart disease of native coronary artery without angina pectoris: Secondary | ICD-10-CM

## 2018-04-27 DIAGNOSIS — Z8679 Personal history of other diseases of the circulatory system: Secondary | ICD-10-CM | POA: Diagnosis not present

## 2018-04-27 DIAGNOSIS — Z9889 Other specified postprocedural states: Secondary | ICD-10-CM | POA: Diagnosis not present

## 2018-04-27 DIAGNOSIS — E785 Hyperlipidemia, unspecified: Secondary | ICD-10-CM | POA: Diagnosis not present

## 2018-04-27 DIAGNOSIS — I639 Cerebral infarction, unspecified: Secondary | ICD-10-CM

## 2018-04-27 HISTORY — DX: Other specified postprocedural states: Z98.890

## 2018-04-27 HISTORY — DX: Personal history of other diseases of the circulatory system: Z86.79

## 2018-04-27 NOTE — Patient Instructions (Signed)
Medication Instructions:  Your physician recommends that you continue on your current medications as directed. Please refer to the Current Medication list given to you today.  If you need a refill on your cardiac medications before your next appointment, please call your pharmacy.   Lab work: NONE If you have labs (blood work) drawn today and your tests are completely normal, you will receive your results only by: Mar[facility] MyChart Message (if you have MyChart) OR . A paper copy in the mail If you have any lab test that is abnormal or we need to change your treatment, we will call you to review the results.  Testing/Procedures: An EKG was performed today  Your physician has requested that you have an echocardiogram. Echocardiography is a painless test that uses sound waves to create images of your heart. It provides your doctor with information about the size and shape of your heart and how well your heart's chambers and valves are working. This procedure takes approximately one hour. There are no restrictions for this procedure.  Your physician has requested that you have a lexiscan myoview. For further information please visit [email or web address]. Please follow instruction sheet, as given.   Follow-Up: At [facility], you and your health needs are our priority.  As part of our continuing mission to provide you with exceptional heart care, we have created designated Provider Care Teams.  These Care Teams include your primary Cardiologist (physician) and Advanced Practice Providers (APPs -  Physician Assistants and Nurse Practitioners) who all work together to provide you with the care you need, when you need it. You will need a follow up appointment in 2 months.    Any Other Special Instructions Will Be Listed Below   Echocardiogram An echocardiogram is a procedure that uses painless sound waves (ultrasound) to produce an image of the heart. Images from an echocardiogram can provide important  information about:  Signs of coronary artery disease (CAD).  Aneurysm detection. An aneurysm is a weak or damaged part of an artery wall that bulges out from the normal force of blood pumping through the body.  Heart size and shape. Changes in the size or shape of the heart can be associated with certain conditions, including heart failure, aneurysm, and CAD.  Heart muscle function.  Heart valve function.  Signs of a past heart attack.  Fluid buildup around the heart.  Thickening of the heart muscle.  A tumor or infectious growth around the heart valves. Tell a health care provider about:  Any allergies you have.  All medicines you are taking, including vitamins, herbs, eye drops, creams, and over-the-counter medicines.  Any blood disorders you have.  Any surgeries you have had.  Any medical conditions you have.  Whether you are pregnant or may be pregnant. What are the risks? Generally, this is a safe procedure. However, problems may occur, including:  Allergic reaction to dye (contrast) that may be used during the procedure. What happens before the procedure? No specific preparation is needed. You may eat and drink normally. What happens during the procedure?   An IV tube may be inserted into one of your veins.  You may receive contrast through this tube. A contrast is an injection that improves the quality of the pictures from your heart.  A gel will be applied to your chest.  A wand-like tool (transducer) will be moved over your chest. The gel will help to transmit the sound waves from the transducer.  The sound waves will harmlessly bounce  off of your heart to allow the heart images to be captured in real-time motion. The images will be recorded on a computer. The procedure may vary among health care providers and hospitals. What happens after the procedure?  You may return to your normal, everyday life, including diet, activities, and medicines, unless your  health care provider tells you not to do that. Summary  An echocardiogram is a procedure that uses painless sound waves (ultrasound) to produce an image of the heart.  Images from an echocardiogram can provide important information about the size and shape of your heart, heart muscle function, heart valve function, and fluid buildup around your heart.  You do not need to do anything to prepare before this procedure. You may eat and drink normally.  After the echocardiogram is completed, you may return to your normal, everyday life, unless your health care provider tells you not to do that. This information is not intended to replace advice given to you by your health care provider. Make sure you discuss any questions you have with your health care provider. Document Released: 02/01/2000 Document Revised: 03/08/2016 Document Reviewed: 03/08/2016   Regadenoson injection What is this medicine? REGADENOSON is used to test the heart for coronary artery disease. It is used in patients who can not exercise for their stress test. This medicine may be used for other purposes; ask your health care provider or pharmacist if you have questions. COMMON BRAND NAME(S): Lexiscan What should I tell my health care provider before I take this medicine? They need to know if you have any of these conditions: -heart problems -lung or breathing disease, like asthma or COPD -an unusual or allergic reaction to regadenoson, other medicines, foods, dyes, or preservatives -pregnant or trying to get pregnant -breast-feeding How should I use this medicine? This medicine is for injection into a vein. It is given by a health care professional in a hospital or clinic setting. Talk to your pediatrician regarding the use of this medicine in children. Special care may be needed. Overdosage: If you think you have taken too much of this medicine contact a poison control center or emergency room at once. NOTE: This medicine  is only for you. Do not share this medicine with others. What if I miss a dose? This does not apply. What may interact with this medicine? -caffeine -dipyridamole -guarana -theophylline This list may not describe all possible interactions. Give your health care provider a list of all the medicines, herbs, non-prescription drugs, or dietary supplements you use. Also tell them if you smoke, drink alcohol, or use illegal drugs. Some items may interact with your medicine. What should I watch for while using this medicine? Your condition will be monitored carefully while you are receiving this medicine. Do not take medicines, foods, or drinks with caffeine (like coffee, tea, or colas) for at least 12 hours before your test. If you do not know if something contains caffeine, ask your health care professional. What side effects may I notice from receiving this medicine? Side effects that you should report to your doctor or health care professional as soon as possible: -allergic reactions like skin rash, itching or hives, swelling of the face, lips, or tongue -breathing problems -chest pain, tightness or palpitations -severe headache Side effects that usually do not require medical attention (report to your doctor or health care professional if they continue or are bothersome): -flushing -headache -irritation or pain at site where injected -nausea, vomiting This list may not describe all  possible side effects. Call your doctor for medical advice about side effects. You may report side effects to FDA at 1-800-FDA-1088. Where should I keep my medicine? This drug is given in a hospital or clinic and will not be stored at home. NOTE: This sheet is a summary. It may not cover all possible information. If you have questions about this medicine, talk to your doctor, pharmacist, or health care provider.  2019 Elsevier/Gold Standard (2007-10-04 15:08:13)  Elsevier Interactive Patient Education  2019  [place].    Cardiac Nuclear Scan A cardiac nuclear scan is a test that measures blood flow to the heart when a person is resting and when he or she is exercising. The test looks for problems such as:  Not enough blood reaching a portion of the heart.  The heart muscle not working normally. You may need this test if:  You have heart disease.  You have had abnormal lab results.  You have had heart surgery or a balloon procedure to open up blocked arteries (angioplasty).  You have chest pain.  You have shortness of breath. In this test, a radioactive dye (tracer) is injected into your bloodstream. After the tracer has traveled to your heart, an imaging device is used to measure how much of the tracer is absorbed by or distributed to various areas of your heart. This procedure is usually done at a hospital and takes 2-4 hours. Tell a health care provider about:  Any allergies you have.  All medicines you are taking, including vitamins, herbs, eye drops, creams, and over-the-counter medicines.  Any problems you or family members have had with anesthetic medicines.  Any blood disorders you have.  Any surgeries you have had.  Any medical conditions you have.  Whether you are pregnant or may be pregnant. What are the risks? Generally, this is a safe procedure. However, problems may occur, including:  Serious chest pain and heart attack. This is only a risk if the stress portion of the test is done.  Rapid heartbeat.  Sensation of warmth in your chest. This usually passes quickly.  Allergic reaction to the tracer. What happens before the procedure?  Ask your health care provider about changing or stopping your regular medicines. This is especially important if you are taking diabetes medicines or blood thinners.  Follow instructions from your health care provider about eating or drinking restrictions.  Remove your jewelry on the day of the procedure. What happens  during the procedure?  An IV will be inserted into one of your veins.  Your health care provider will inject a small amount of radioactive tracer through the IV.  You will wait for 20-40 minutes while the tracer travels through your bloodstream.  Your heart activity will be monitored with an electrocardiogram (ECG).  You will lie down on an exam table.  Images of your heart will be taken for about 15-20 minutes.  You may also have a stress test. For this test, one of the following may be done: ? You will exercise on a treadmill or stationary bike. While you exercise, your heart's activity will be monitored with an ECG, and your blood pressure will be checked. ? You will be given medicines that will increase blood flow to parts of your heart. This is done if you are unable to exercise.  When blood flow to your heart has peaked, a tracer will again be injected through the IV.  After 20-40 minutes, you will get back on the exam table  and have more images taken of your heart.  Depending on the type of tracer used, scans may need to be repeated 3-4 hours later.  Your IV line will be removed when the procedure is over. The procedure may vary among health care providers and hospitals. What happens after the procedure?  Unless your health care provider tells you otherwise, you may return to your normal schedule, including diet, activities, and medicines.  Unless your health care provider tells you otherwise, you may increase your fluid intake. This will help to flush the contrast dye from your body. Drink enough fluid to keep your urine pale yellow.  Ask your health care provider, or the department that is doing the test: ? When will my results be ready? ? How will I get my results? Summary  A cardiac nuclear scan measures the blood flow to the heart when a person is resting and when he or she is exercising.  Tell your health care provider if you are pregnant.  Before the procedure,  ask your health care provider about changing or stopping your regular medicines. This is especially important if you are taking diabetes medicines or blood thinners.  After the procedure, unless your health care provider tells you otherwise, increase your fluid intake. This will help flush the contrast dye from your body.  After the procedure, unless your health care provider tells you otherwise, you may return to your normal schedule, including diet, activities, and medicines. This information is not intended to replace advice given to you by your health care provider. Make sure you discuss any questions you have with your health care provider. Document Released: 02/29/2004 Document Revised: 07/20/2017 Document Reviewed: 07/20/2017 Elsevier Interactive Patient Education  2019 [employer].

## 2018-04-27 NOTE — Progress Notes (Signed)
Cardiology Office Note:    Date:  04/27/2018   ID:  Chris Braun, DOB 10-Sep-1935, MRN [id number]  PCP:  [name]., MD  Cardiologist:  Jenean Lindau, MD   Referring MD: [name]., MD    ASSESSMENT:    1. Coronary artery disease involving native coronary artery of native heart without angina pectoris   2. Cerebrovascular accident (CVA), unspecified mechanism ([place])   3. Dyslipidemia   4. Status post ablation of ventricular arrhythmia    PLAN:    In order of problems listed above:  1. Secondary prevention stressed with the patient.  Importance of compliance with diet and medication stressed and he vocalized understanding.  His blood pressure is stable.  Diet was discussed for dyslipidemia coronary artery disease. 2. Echocardiogram will be done to assess murmur heard on auscultation. 3. He has atypical chest pain symptoms however in view of existing coronary artery disease we will do a Lexiscan sestamibi. 4. He has been recently diagnosed to have abdominal aortic aneurysm and I have reviewed the CT scan report and we will do an ultrasound as recommended at the end of the year.  On aspirin and statin therapy and his blood pressure is well controlled. 5. Patient will be seen in follow-up appointment in 2 months or earlier if the patient has any concerns    Medication Adjustments/Labs and Tests Ordered: Current medicines are reviewed at length with the patient today.  Concerns regarding medicines are outlined above.  No orders of the defined types were placed in this encounter.  No orders of the defined types were placed in this encounter.    No chief complaint on file.    History of Present Illness:    Chris Braun is a 83 y.o. male.  Patient has history of coronary artery disease and has had multiple issues with infection and pneumonia and lower back issues for which she is being treated.  He denies any problems at this time and takes care of activities of daily  living.  He leads a sedentary lifestyle and he complains of occasional chest.  His wife mentions to me that he complains of chest discomfort and this might be related to his significant coughing episodes.  At the time of my evaluation, the patient is alert awake oriented and in no distress.  Past Medical History:  Diagnosis Date  . Asthma   . Cataracts, bilateral   . Compression fracture    BAck  . COPD (chronic obstructive pulmonary disease) ([place])   . Depression   . Diverticulosis    per colonscopy  . Gait abnormality 01/19/2018  . GERD (gastroesophageal reflux disease)   . High cholesterol   . Hypertension   . Kidney stones   . Macular degeneration   . Memory disorder 01/19/2018  . Myocardial infarction ([place]) 2002  . 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 Osteoporosis   . Stroke [facility])     Past Surgical History:  Procedure Laterality Date  . CARDIAC CATHETERIZATION  2002,   stents   . CATARACT EXTRACTION Left 2016  . Colonscopy  2012  . HIP FRACTURE SURGERY  2008   right  . HYDROCELE EXCISION  10/12/2014  . LITHOTRIPSY     2007, 2009, 2010, 2011, 2012, 2013  . LUMBAR LAMINECTOMY/DECOMPRESSION MICRODISCECTOMY  03/15/2012   Procedure: LUMBAR LAMINECTOMY/DECOMPRESSION MICRODISCECTOMY 1 LEVEL;  Surgeon: Elaina Hoops, MD;  Location: [place] NEURO ORS;  Service: Neurosurgery;  Laterality: Left;  Left lumbar  three-four decompressive lumbar laminectomy, discectomy  . MAXIMUM ACCESS (MAS)POSTERIOR LUMBAR INTERBODY FUSION (PLIF) 2 LEVEL N/A 12/08/2012   Procedure: FOR MAXIMUM ACCESS (MAS) POSTERIOR LUMBAR INTERBODY FUSION (PLIF) 2 LEVEL;  Surgeon: Elaina Hoops, MD;  Location: [place] NEURO ORS;  Service: Neurosurgery;  Laterality: N/A;  FOR MAXIMUM ACCESS (MAS) POSTERIOR LUMBAR INTERBODY FUSION (PLIF) 2 LEVEL  . ROTATOR CUFF REPAIR  1999   bil   . [place]  . VT study     with Ablation    Current Medications: Current Meds  Medication Sig  . albuterol  (PROVENTIL HFA;VENTOLIN HFA) 108 (90 BASE) MCG/ACT inhaler Inhale 2 puffs into the lungs every 6 (six) hours as needed for wheezing.  Marland Kitchen alendronate (FOSAMAX) 70 MG tablet Take 70 mg by mouth every 7 (seven) days. Take with a full glass of water on an empty stomach. Takes on Friday.  Marland Kitchen aspirin EC 81 MG tablet Take 81 mg by mouth daily.  Marland Kitchen atorvastatin (LIPITOR) 40 MG tablet Take 40 mg by mouth daily.  . benzonatate (TESSALON) 200 MG capsule Take 200 mg by mouth 3 (three) times daily as needed for cough.  . Calcium Carb-Cholecalciferol (CALCIUM-VITAMIN D) 500-200 MG-UNIT tablet Take 1 tablet by mouth daily.  . clopidogrel (PLAVIX) 75 MG tablet Take 75 mg by mouth daily.  . Cyanocobalamin (B-12 PO) Take 2,500 mcg by mouth daily.  Marland Kitchen donepezil (ARICEPT) 5 MG tablet Take 5 mg by mouth at bedtime.   Marland Kitchen Fexofenadine HCl (MUCINEX ALLERGY PO) Take 1,200 mg by mouth as needed.  Marland Kitchen HYDROcodone-acetaminophen (NORCO/VICODIN) 5-325 MG tablet Take 1 tablet by mouth every 6 (six) hours as needed for moderate pain.  . IRON PO Take 27 mg by mouth daily.  Marland Kitchen loratadine (CLARITIN) 10 MG tablet Take 10 mg by mouth.  . Lutein 40 MG CAPS Take 40 mg by mouth daily.  . metoprolol succinate (TOPROL-XL) 25 MG 24 hr tablet Take 12.5 mg by mouth at bedtime.   . montelukast (SINGULAIR) 10 MG tablet Take 10 mg by mouth at bedtime.  . Multiple Vitamins-Minerals (MULTIVITAMIN PO) Take 1 tablet by mouth daily.  . ondansetron (ZOFRAN) 4 MG tablet Take 4 mg by mouth every 8 (eight) hours as needed for nausea or vomiting.  . ranitidine (ZANTAC) 150 MG tablet Take 150 mg by mouth 2 (two) times daily.  Marland Kitchen rOPINIRole (REQUIP) 0.5 MG tablet Take 0.5 mg by mouth 3 (three) times daily.  . SYMBICORT 160-4.5 MCG/ACT inhaler Inhale 2 puffs into the lungs 2 (two) times daily.  . traMADol (ULTRAM) 50 MG tablet Take 50 mg by mouth every 6 (six) hours as needed.  . Vitamin D, Ergocalciferol, (DRISDOL) 50000 units CAPS capsule Take 50,000 Units by  mouth once a week.     Allergies:   Other; Ibuprofen; and Oxycodone   Social History   Socioeconomic History  . Marital status: Married    Spouse name: Jihan Rudy  . Number of children: 4  . Years of education: 50  . Highest education level: Not on file  Occupational History  . Occupation: Retired  Scientific laboratory technician  . Financial resource strain: Not on file  . Food insecurity:    Worry: Not on file    Inability: Not on file  . Transportation needs:    Medical: Not on file    Non-medical: Not on file  Tobacco Use  . Smoking status: Former Smoker    Packs/day: 1.00  Years: 45.00    Pack years: 45.00    Types: Cigarettes    Last attempt to quit: 02/02/2001    Years since quitting: 17.2  . Smokeless tobacco: Current User    Types: Chew  Substance and Sexual Activity  . Alcohol use: No  . Drug use: No  . Sexual activity: Not on file  Lifestyle  . Physical activity:    Days per week: Not on file    Minutes per session: Not on file  . Stress: Not on file  Relationships  . Social connections:    Talks on phone: Not on file    Gets together: Not on file    Attends religious service: Not on file    Active member of club or organization: Not on file    Attends meetings of clubs or organizations: Not on file    Relationship status: Not on file  Other Topics Concern  . Not on file  Social History Narrative   Lives with wife   Caffeine use: coffee, tea     Family History: The patient's family history includes Hemolytic uremic syndrome in his mother; Stroke in his father.  ROS:   Please see the history of present illness.    All other systems reviewed and are negative.  EKGs/Labs/Other Studies Reviewed:    The following studies were reviewed today: EKG reveals sinus rhythm and nonspecific ST-T changes.   Recent Labs: 10/07/2017: TSH 1.260  Recent Lipid Panel No results found for: CHOL, TRIG, HDL, CHOLHDL, VLDL, LDLCALC, LDLDIRECT  Physical Exam:    VS:  BP  (!) 126/58 (BP Location: Right Arm, Patient Position: Sitting, Cuff Size: Normal)   Pulse 78   Ht 5\' 7"  (1.702 m)   Wt 163 lb (73.9 kg)   SpO2 98%   BMI 25.53 kg/m     Wt Readings from Last 3 Encounters:  04/27/18 163 lb (73.9 kg)  01/19/18 173 lb (78.5 kg)  12/22/17 170 lb (77.1 kg)     GEN: Patient is in no acute distress HEENT: Normal NECK: No JVD; No carotid bruits LYMPHATICS: No lymphadenopathy CARDIAC: Hear sounds regular, 2/6 systolic murmur at the apex. RESPIRATORY:  Clear to auscultation without rales, wheezing or rhonchi  ABDOMEN: Soft, non-tender, non-distended MUSCULOSKELETAL:  No edema; No deformity  SKIN: Warm and dry NEUROLOGIC:  Alert and oriented x 3 PSYCHIATRIC:  Normal affect   Signed, Jenean Lindau, MD  04/27/2018 4:28 PM    [facility]

## 2018-05-18 ENCOUNTER — Telehealth: Payer: Self-pay | Admitting: *Deleted

## 2018-05-18 NOTE — Telephone Encounter (Signed)
Cancelled echo, pt knows

## 2018-05-25 DIAGNOSIS — N201 Calculus of ureter: Secondary | ICD-10-CM | POA: Diagnosis not present

## 2018-05-25 DIAGNOSIS — N200 Calculus of kidney: Secondary | ICD-10-CM | POA: Diagnosis not present

## 2018-05-26 DIAGNOSIS — I6381 Other cerebral infarction due to occlusion or stenosis of small artery: Secondary | ICD-10-CM | POA: Diagnosis not present

## 2018-05-26 DIAGNOSIS — E785 Hyperlipidemia, unspecified: Secondary | ICD-10-CM | POA: Diagnosis not present

## 2018-05-26 DIAGNOSIS — I2510 Atherosclerotic heart disease of native coronary artery without angina pectoris: Secondary | ICD-10-CM | POA: Diagnosis not present

## 2018-05-26 DIAGNOSIS — Z7984 Long term (current) use of oral hypoglycemic drugs: Secondary | ICD-10-CM | POA: Diagnosis not present

## 2018-05-26 DIAGNOSIS — N390 Urinary tract infection, site not specified: Secondary | ICD-10-CM | POA: Diagnosis not present

## 2018-05-26 DIAGNOSIS — N183 Chronic kidney disease, stage 3 (moderate): Secondary | ICD-10-CM | POA: Diagnosis not present

## 2018-05-26 DIAGNOSIS — R339 Retention of urine, unspecified: Secondary | ICD-10-CM | POA: Diagnosis not present

## 2018-05-26 DIAGNOSIS — M1A9XX0 Chronic gout, unspecified, without tophus (tophi): Secondary | ICD-10-CM | POA: Diagnosis not present

## 2018-05-26 DIAGNOSIS — N201 Calculus of ureter: Secondary | ICD-10-CM | POA: Diagnosis not present

## 2018-05-26 DIAGNOSIS — I5023 Acute on chronic systolic (congestive) heart failure: Secondary | ICD-10-CM | POA: Diagnosis not present

## 2018-05-26 DIAGNOSIS — R509 Fever, unspecified: Secondary | ICD-10-CM | POA: Diagnosis not present

## 2018-05-26 DIAGNOSIS — G4733 Obstructive sleep apnea (adult) (pediatric): Secondary | ICD-10-CM | POA: Diagnosis not present

## 2018-05-26 DIAGNOSIS — Z03818 Encounter for observation for suspected exposure to other biological agents ruled out: Secondary | ICD-10-CM | POA: Diagnosis not present

## 2018-05-26 DIAGNOSIS — K5900 Constipation, unspecified: Secondary | ICD-10-CM | POA: Diagnosis not present

## 2018-05-26 DIAGNOSIS — Z01812 Encounter for preprocedural laboratory examination: Secondary | ICD-10-CM | POA: Diagnosis not present

## 2018-05-26 DIAGNOSIS — M6740 Ganglion, unspecified site: Secondary | ICD-10-CM | POA: Diagnosis not present

## 2018-05-26 DIAGNOSIS — N179 Acute kidney failure, unspecified: Secondary | ICD-10-CM | POA: Diagnosis not present

## 2018-05-26 DIAGNOSIS — R262 Difficulty in walking, not elsewhere classified: Secondary | ICD-10-CM | POA: Diagnosis not present

## 2018-05-26 DIAGNOSIS — L501 Idiopathic urticaria: Secondary | ICD-10-CM | POA: Diagnosis not present

## 2018-05-26 DIAGNOSIS — Z7901 Long term (current) use of anticoagulants: Secondary | ICD-10-CM | POA: Diagnosis not present

## 2018-05-26 DIAGNOSIS — Z8673 Personal history of transient ischemic attack (TIA), and cerebral infarction without residual deficits: Secondary | ICD-10-CM | POA: Diagnosis not present

## 2018-05-26 DIAGNOSIS — I10 Essential (primary) hypertension: Secondary | ICD-10-CM | POA: Diagnosis not present

## 2018-05-26 DIAGNOSIS — E039 Hypothyroidism, unspecified: Secondary | ICD-10-CM | POA: Diagnosis not present

## 2018-05-26 DIAGNOSIS — Z86711 Personal history of pulmonary embolism: Secondary | ICD-10-CM | POA: Diagnosis not present

## 2018-05-26 DIAGNOSIS — J432 Centrilobular emphysema: Secondary | ICD-10-CM | POA: Diagnosis not present

## 2018-05-26 DIAGNOSIS — E7849 Other hyperlipidemia: Secondary | ICD-10-CM | POA: Diagnosis not present

## 2018-05-26 DIAGNOSIS — J189 Pneumonia, unspecified organism: Secondary | ICD-10-CM | POA: Diagnosis not present

## 2018-05-26 DIAGNOSIS — R5381 Other malaise: Secondary | ICD-10-CM | POA: Diagnosis not present

## 2018-05-26 DIAGNOSIS — I739 Peripheral vascular disease, unspecified: Secondary | ICD-10-CM | POA: Diagnosis not present

## 2018-05-26 DIAGNOSIS — C9110 Chronic lymphocytic leukemia of B-cell type not having achieved remission: Secondary | ICD-10-CM | POA: Diagnosis not present

## 2018-05-26 DIAGNOSIS — R410 Disorientation, unspecified: Secondary | ICD-10-CM | POA: Diagnosis not present

## 2018-05-26 DIAGNOSIS — Z794 Long term (current) use of insulin: Secondary | ICD-10-CM | POA: Diagnosis not present

## 2018-05-26 DIAGNOSIS — R05 Cough: Secondary | ICD-10-CM | POA: Diagnosis not present

## 2018-05-26 DIAGNOSIS — R4701 Aphasia: Secondary | ICD-10-CM | POA: Diagnosis not present

## 2018-05-26 DIAGNOSIS — R918 Other nonspecific abnormal finding of lung field: Secondary | ICD-10-CM | POA: Diagnosis not present

## 2018-05-26 DIAGNOSIS — R7989 Other specified abnormal findings of blood chemistry: Secondary | ICD-10-CM | POA: Diagnosis not present

## 2018-05-26 DIAGNOSIS — I110 Hypertensive heart disease with heart failure: Secondary | ICD-10-CM | POA: Diagnosis not present

## 2018-05-26 DIAGNOSIS — R7303 Prediabetes: Secondary | ICD-10-CM | POA: Diagnosis not present

## 2018-05-26 DIAGNOSIS — Z79899 Other long term (current) drug therapy: Secondary | ICD-10-CM | POA: Diagnosis not present

## 2018-05-26 DIAGNOSIS — F329 Major depressive disorder, single episode, unspecified: Secondary | ICD-10-CM | POA: Diagnosis not present

## 2018-05-26 DIAGNOSIS — R0602 Shortness of breath: Secondary | ICD-10-CM | POA: Diagnosis not present

## 2018-05-26 DIAGNOSIS — I4891 Unspecified atrial fibrillation: Secondary | ICD-10-CM | POA: Diagnosis not present

## 2018-05-26 DIAGNOSIS — N1330 Unspecified hydronephrosis: Secondary | ICD-10-CM | POA: Diagnosis not present

## 2018-05-26 DIAGNOSIS — K047 Periapical abscess without sinus: Secondary | ICD-10-CM | POA: Diagnosis not present

## 2018-05-26 DIAGNOSIS — J439 Emphysema, unspecified: Secondary | ICD-10-CM | POA: Diagnosis not present

## 2018-05-26 DIAGNOSIS — R1310 Dysphagia, unspecified: Secondary | ICD-10-CM | POA: Diagnosis not present

## 2018-05-26 DIAGNOSIS — I351 Nonrheumatic aortic (valve) insufficiency: Secondary | ICD-10-CM | POA: Diagnosis not present

## 2018-05-26 DIAGNOSIS — N289 Disorder of kidney and ureter, unspecified: Secondary | ICD-10-CM | POA: Diagnosis not present

## 2018-05-26 DIAGNOSIS — E1149 Type 2 diabetes mellitus with other diabetic neurological complication: Secondary | ICD-10-CM | POA: Diagnosis not present

## 2018-05-26 DIAGNOSIS — I714 Abdominal aortic aneurysm, without rupture: Secondary | ICD-10-CM | POA: Diagnosis not present

## 2018-05-26 DIAGNOSIS — M47817 Spondylosis without myelopathy or radiculopathy, lumbosacral region: Secondary | ICD-10-CM | POA: Diagnosis not present

## 2018-05-26 DIAGNOSIS — C8511 Unspecified B-cell lymphoma, lymph nodes of head, face, and neck: Secondary | ICD-10-CM | POA: Diagnosis not present

## 2018-05-26 DIAGNOSIS — N3001 Acute cystitis with hematuria: Secondary | ICD-10-CM | POA: Diagnosis not present

## 2018-05-26 DIAGNOSIS — E782 Mixed hyperlipidemia: Secondary | ICD-10-CM | POA: Diagnosis not present

## 2018-05-26 DIAGNOSIS — Z1159 Encounter for screening for other viral diseases: Secondary | ICD-10-CM | POA: Diagnosis not present

## 2018-05-26 DIAGNOSIS — Z7982 Long term (current) use of aspirin: Secondary | ICD-10-CM | POA: Diagnosis not present

## 2018-05-26 DIAGNOSIS — R0902 Hypoxemia: Secondary | ICD-10-CM | POA: Diagnosis not present

## 2018-05-26 DIAGNOSIS — D72829 Elevated white blood cell count, unspecified: Secondary | ICD-10-CM | POA: Diagnosis not present

## 2018-05-26 DIAGNOSIS — Z86718 Personal history of other venous thrombosis and embolism: Secondary | ICD-10-CM | POA: Diagnosis not present

## 2018-05-26 DIAGNOSIS — I129 Hypertensive chronic kidney disease with stage 1 through stage 4 chronic kidney disease, or unspecified chronic kidney disease: Secondary | ICD-10-CM | POA: Diagnosis not present

## 2018-05-26 DIAGNOSIS — G4700 Insomnia, unspecified: Secondary | ICD-10-CM | POA: Diagnosis not present

## 2018-05-26 DIAGNOSIS — Z95810 Presence of automatic (implantable) cardiac defibrillator: Secondary | ICD-10-CM | POA: Diagnosis not present

## 2018-05-26 DIAGNOSIS — R5383 Other fatigue: Secondary | ICD-10-CM | POA: Diagnosis not present

## 2018-05-26 DIAGNOSIS — G894 Chronic pain syndrome: Secondary | ICD-10-CM | POA: Diagnosis not present

## 2018-05-26 DIAGNOSIS — R221 Localized swelling, mass and lump, neck: Secondary | ICD-10-CM | POA: Diagnosis not present

## 2018-05-26 DIAGNOSIS — Z66 Do not resuscitate: Secondary | ICD-10-CM | POA: Diagnosis not present

## 2018-05-26 DIAGNOSIS — Z789 Other specified health status: Secondary | ICD-10-CM | POA: Diagnosis not present

## 2018-05-26 DIAGNOSIS — J449 Chronic obstructive pulmonary disease, unspecified: Secondary | ICD-10-CM | POA: Diagnosis not present

## 2018-05-26 DIAGNOSIS — E7800 Pure hypercholesterolemia, unspecified: Secondary | ICD-10-CM | POA: Diagnosis not present

## 2018-05-26 DIAGNOSIS — R590 Localized enlarged lymph nodes: Secondary | ICD-10-CM | POA: Diagnosis not present

## 2018-05-26 DIAGNOSIS — F0390 Unspecified dementia without behavioral disturbance: Secondary | ICD-10-CM | POA: Diagnosis not present

## 2018-05-26 DIAGNOSIS — E0520 Thyrotoxicosis with toxic multinodular goiter without thyrotoxic crisis or storm: Secondary | ICD-10-CM | POA: Diagnosis not present

## 2018-05-26 DIAGNOSIS — R110 Nausea: Secondary | ICD-10-CM | POA: Diagnosis not present

## 2018-05-26 DIAGNOSIS — Z955 Presence of coronary angioplasty implant and graft: Secondary | ICD-10-CM | POA: Diagnosis not present

## 2018-05-26 DIAGNOSIS — N182 Chronic kidney disease, stage 2 (mild): Secondary | ICD-10-CM | POA: Diagnosis not present

## 2018-05-26 DIAGNOSIS — G8929 Other chronic pain: Secondary | ICD-10-CM | POA: Diagnosis not present

## 2018-05-26 DIAGNOSIS — E559 Vitamin D deficiency, unspecified: Secondary | ICD-10-CM | POA: Diagnosis not present

## 2018-05-26 DIAGNOSIS — Z20828 Contact with and (suspected) exposure to other viral communicable diseases: Secondary | ICD-10-CM | POA: Diagnosis not present

## 2018-05-26 DIAGNOSIS — E1142 Type 2 diabetes mellitus with diabetic polyneuropathy: Secondary | ICD-10-CM | POA: Diagnosis not present

## 2018-05-26 DIAGNOSIS — E538 Deficiency of other specified B group vitamins: Secondary | ICD-10-CM | POA: Diagnosis not present

## 2018-05-26 DIAGNOSIS — Z452 Encounter for adjustment and management of vascular access device: Secondary | ICD-10-CM | POA: Diagnosis not present

## 2018-05-26 DIAGNOSIS — E1122 Type 2 diabetes mellitus with diabetic chronic kidney disease: Secondary | ICD-10-CM | POA: Diagnosis not present

## 2018-05-26 DIAGNOSIS — Z8249 Family history of ischemic heart disease and other diseases of the circulatory system: Secondary | ICD-10-CM | POA: Diagnosis not present

## 2018-05-27 ENCOUNTER — Encounter (HOSPITAL_COMMUNITY): Payer: Self-pay | Admitting: *Deleted

## 2018-05-27 ENCOUNTER — Other Ambulatory Visit: Payer: Self-pay

## 2018-05-27 ENCOUNTER — Inpatient Hospital Stay (HOSPITAL_COMMUNITY)
Admission: AD | Admit: 2018-05-27 | Discharge: 2018-05-31 | DRG: 987 | Disposition: A | Discharge status: Home / Self Care | Payer: PPO | Source: Other Acute Inpatient Hospital | Attending: Internal Medicine | Admitting: Internal Medicine

## 2018-05-27 DIAGNOSIS — I10 Essential (primary) hypertension: Secondary | ICD-10-CM | POA: Diagnosis not present

## 2018-05-27 DIAGNOSIS — Z7902 Long term (current) use of antithrombotics/antiplatelets: Secondary | ICD-10-CM

## 2018-05-27 DIAGNOSIS — Z79899 Other long term (current) drug therapy: Secondary | ICD-10-CM

## 2018-05-27 DIAGNOSIS — Z419 Encounter for procedure for purposes other than remedying health state, unspecified: Secondary | ICD-10-CM

## 2018-05-27 DIAGNOSIS — M0579 Rheumatoid arthritis with rheumatoid factor of multiple sites without organ or systems involvement: Secondary | ICD-10-CM | POA: Diagnosis not present

## 2018-05-27 DIAGNOSIS — M5414 Radiculopathy, thoracic region: Secondary | ICD-10-CM | POA: Diagnosis not present

## 2018-05-27 DIAGNOSIS — Z7982 Long term (current) use of aspirin: Secondary | ICD-10-CM | POA: Diagnosis not present

## 2018-05-27 DIAGNOSIS — R0902 Hypoxemia: Secondary | ICD-10-CM | POA: Diagnosis not present

## 2018-05-27 DIAGNOSIS — J440 Chronic obstructive pulmonary disease with acute lower respiratory infection: Secondary | ICD-10-CM | POA: Diagnosis not present

## 2018-05-27 DIAGNOSIS — F17220 Nicotine dependence, chewing tobacco, uncomplicated: Secondary | ICD-10-CM | POA: Diagnosis present

## 2018-05-27 DIAGNOSIS — S63502D Unspecified sprain of left wrist, subsequent encounter: Secondary | ICD-10-CM | POA: Diagnosis not present

## 2018-05-27 DIAGNOSIS — I5032 Chronic diastolic (congestive) heart failure: Secondary | ICD-10-CM | POA: Diagnosis present

## 2018-05-27 DIAGNOSIS — N390 Urinary tract infection, site not specified: Secondary | ICD-10-CM | POA: Diagnosis not present

## 2018-05-27 DIAGNOSIS — R413 Other amnesia: Secondary | ICD-10-CM | POA: Diagnosis not present

## 2018-05-27 DIAGNOSIS — Z7951 Long term (current) use of inhaled steroids: Secondary | ICD-10-CM | POA: Diagnosis not present

## 2018-05-27 DIAGNOSIS — I110 Hypertensive heart disease with heart failure: Secondary | ICD-10-CM | POA: Diagnosis present

## 2018-05-27 DIAGNOSIS — F900 Attention-deficit hyperactivity disorder, predominantly inattentive type: Secondary | ICD-10-CM | POA: Diagnosis not present

## 2018-05-27 DIAGNOSIS — N179 Acute kidney failure, unspecified: Secondary | ICD-10-CM

## 2018-05-27 DIAGNOSIS — I719 Aortic aneurysm of unspecified site, without rupture: Secondary | ICD-10-CM | POA: Diagnosis not present

## 2018-05-27 DIAGNOSIS — D0371 Melanoma in situ of right lower limb, including hip: Secondary | ICD-10-CM | POA: Diagnosis not present

## 2018-05-27 DIAGNOSIS — J189 Pneumonia, unspecified organism: Principal | ICD-10-CM | POA: Diagnosis present

## 2018-05-27 DIAGNOSIS — E119 Type 2 diabetes mellitus without complications: Secondary | ICD-10-CM | POA: Diagnosis not present

## 2018-05-27 DIAGNOSIS — Z466 Encounter for fitting and adjustment of urinary device: Secondary | ICD-10-CM | POA: Diagnosis not present

## 2018-05-27 DIAGNOSIS — M810 Age-related osteoporosis without current pathological fracture: Secondary | ICD-10-CM | POA: Diagnosis present

## 2018-05-27 DIAGNOSIS — M5137 Other intervertebral disc degeneration, lumbosacral region: Secondary | ICD-10-CM | POA: Diagnosis not present

## 2018-05-27 DIAGNOSIS — M9903 Segmental and somatic dysfunction of lumbar region: Secondary | ICD-10-CM | POA: Diagnosis not present

## 2018-05-27 DIAGNOSIS — N132 Hydronephrosis with renal and ureteral calculous obstruction: Secondary | ICD-10-CM | POA: Diagnosis not present

## 2018-05-27 DIAGNOSIS — M9905 Segmental and somatic dysfunction of pelvic region: Secondary | ICD-10-CM | POA: Diagnosis not present

## 2018-05-27 DIAGNOSIS — F0390 Unspecified dementia without behavioral disturbance: Secondary | ICD-10-CM | POA: Diagnosis not present

## 2018-05-27 DIAGNOSIS — R0600 Dyspnea, unspecified: Secondary | ICD-10-CM | POA: Diagnosis not present

## 2018-05-27 DIAGNOSIS — J439 Emphysema, unspecified: Secondary | ICD-10-CM | POA: Diagnosis not present

## 2018-05-27 DIAGNOSIS — M549 Dorsalgia, unspecified: Secondary | ICD-10-CM | POA: Diagnosis not present

## 2018-05-27 DIAGNOSIS — N200 Calculus of kidney: Secondary | ICD-10-CM

## 2018-05-27 DIAGNOSIS — N138 Other obstructive and reflux uropathy: Secondary | ICD-10-CM | POA: Diagnosis not present

## 2018-05-27 DIAGNOSIS — M79676 Pain in unspecified toe(s): Secondary | ICD-10-CM | POA: Diagnosis not present

## 2018-05-27 DIAGNOSIS — Z87442 Personal history of urinary calculi: Secondary | ICD-10-CM | POA: Diagnosis not present

## 2018-05-27 DIAGNOSIS — J181 Lobar pneumonia, unspecified organism: Secondary | ICD-10-CM | POA: Diagnosis not present

## 2018-05-27 DIAGNOSIS — J449 Chronic obstructive pulmonary disease, unspecified: Secondary | ICD-10-CM | POA: Diagnosis not present

## 2018-05-27 DIAGNOSIS — Z23 Encounter for immunization: Secondary | ICD-10-CM | POA: Diagnosis not present

## 2018-05-27 DIAGNOSIS — E538 Deficiency of other specified B group vitamins: Secondary | ICD-10-CM | POA: Diagnosis not present

## 2018-05-27 DIAGNOSIS — J4540 Moderate persistent asthma, uncomplicated: Secondary | ICD-10-CM | POA: Diagnosis not present

## 2018-05-27 DIAGNOSIS — N1330 Unspecified hydronephrosis: Secondary | ICD-10-CM

## 2018-05-27 DIAGNOSIS — I2510 Atherosclerotic heart disease of native coronary artery without angina pectoris: Secondary | ICD-10-CM | POA: Diagnosis present

## 2018-05-27 DIAGNOSIS — I252 Old myocardial infarction: Secondary | ICD-10-CM | POA: Diagnosis not present

## 2018-05-27 DIAGNOSIS — R443 Hallucinations, unspecified: Secondary | ICD-10-CM | POA: Diagnosis not present

## 2018-05-27 DIAGNOSIS — Z8673 Personal history of transient ischemic attack (TIA), and cerebral infarction without residual deficits: Secondary | ICD-10-CM | POA: Diagnosis not present

## 2018-05-27 DIAGNOSIS — M150 Primary generalized (osteo)arthritis: Secondary | ICD-10-CM | POA: Diagnosis not present

## 2018-05-27 DIAGNOSIS — R600 Localized edema: Secondary | ICD-10-CM | POA: Diagnosis not present

## 2018-05-27 DIAGNOSIS — L932 Other local lupus erythematosus: Secondary | ICD-10-CM | POA: Diagnosis not present

## 2018-05-27 DIAGNOSIS — M8700 Idiopathic aseptic necrosis of unspecified bone: Secondary | ICD-10-CM | POA: Diagnosis not present

## 2018-05-27 DIAGNOSIS — J9601 Acute respiratory failure with hypoxia: Secondary | ICD-10-CM

## 2018-05-27 DIAGNOSIS — Z515 Encounter for palliative care: Secondary | ICD-10-CM | POA: Diagnosis not present

## 2018-05-27 DIAGNOSIS — E7800 Pure hypercholesterolemia, unspecified: Secondary | ICD-10-CM | POA: Diagnosis present

## 2018-05-27 DIAGNOSIS — N289 Disorder of kidney and ureter, unspecified: Secondary | ICD-10-CM

## 2018-05-27 DIAGNOSIS — R739 Hyperglycemia, unspecified: Secondary | ICD-10-CM | POA: Diagnosis not present

## 2018-05-27 DIAGNOSIS — E890 Postprocedural hypothyroidism: Secondary | ICD-10-CM | POA: Diagnosis not present

## 2018-05-27 DIAGNOSIS — Z85850 Personal history of malignant neoplasm of thyroid: Secondary | ICD-10-CM | POA: Diagnosis not present

## 2018-05-27 DIAGNOSIS — R110 Nausea: Secondary | ICD-10-CM | POA: Diagnosis not present

## 2018-05-27 DIAGNOSIS — Z955 Presence of coronary angioplasty implant and graft: Secondary | ICD-10-CM | POA: Diagnosis not present

## 2018-05-27 DIAGNOSIS — M8580 Other specified disorders of bone density and structure, unspecified site: Secondary | ICD-10-CM | POA: Diagnosis not present

## 2018-05-27 DIAGNOSIS — R079 Chest pain, unspecified: Secondary | ICD-10-CM | POA: Diagnosis not present

## 2018-05-27 DIAGNOSIS — M8949 Other hypertrophic osteoarthropathy, multiple sites: Secondary | ICD-10-CM | POA: Diagnosis not present

## 2018-05-27 DIAGNOSIS — M9902 Segmental and somatic dysfunction of thoracic region: Secondary | ICD-10-CM | POA: Diagnosis not present

## 2018-05-27 DIAGNOSIS — R300 Dysuria: Secondary | ICD-10-CM | POA: Diagnosis not present

## 2018-05-27 DIAGNOSIS — Z0000 Encounter for general adult medical examination without abnormal findings: Secondary | ICD-10-CM | POA: Diagnosis not present

## 2018-05-27 DIAGNOSIS — B351 Tinea unguium: Secondary | ICD-10-CM | POA: Diagnosis not present

## 2018-05-27 DIAGNOSIS — R7303 Prediabetes: Secondary | ICD-10-CM | POA: Diagnosis not present

## 2018-05-27 DIAGNOSIS — E782 Mixed hyperlipidemia: Secondary | ICD-10-CM | POA: Diagnosis not present

## 2018-05-27 DIAGNOSIS — E785 Hyperlipidemia, unspecified: Secondary | ICD-10-CM | POA: Diagnosis present

## 2018-05-27 DIAGNOSIS — N401 Enlarged prostate with lower urinary tract symptoms: Secondary | ICD-10-CM | POA: Diagnosis not present

## 2018-05-27 DIAGNOSIS — G4731 Primary central sleep apnea: Secondary | ICD-10-CM | POA: Diagnosis not present

## 2018-05-27 DIAGNOSIS — I70229 Atherosclerosis of native arteries of extremities with rest pain, unspecified extremity: Secondary | ICD-10-CM | POA: Diagnosis not present

## 2018-05-27 DIAGNOSIS — R1110 Vomiting, unspecified: Secondary | ICD-10-CM

## 2018-05-27 DIAGNOSIS — R7309 Other abnormal glucose: Secondary | ICD-10-CM | POA: Diagnosis not present

## 2018-05-27 DIAGNOSIS — I714 Abdominal aortic aneurysm, without rupture: Secondary | ICD-10-CM | POA: Diagnosis not present

## 2018-05-27 DIAGNOSIS — N201 Calculus of ureter: Secondary | ICD-10-CM | POA: Diagnosis not present

## 2018-05-27 DIAGNOSIS — M5386 Other specified dorsopathies, lumbar region: Secondary | ICD-10-CM | POA: Diagnosis not present

## 2018-05-27 DIAGNOSIS — E034 Atrophy of thyroid (acquired): Secondary | ICD-10-CM | POA: Diagnosis not present

## 2018-05-27 DIAGNOSIS — C61 Malignant neoplasm of prostate: Secondary | ICD-10-CM | POA: Diagnosis not present

## 2018-05-27 HISTORY — DX: Pneumonia, unspecified organism: J18.9

## 2018-05-27 HISTORY — DX: Acute respiratory failure with hypoxia: J96.01

## 2018-05-27 HISTORY — DX: Calculus of kidney: N20.0

## 2018-05-27 HISTORY — DX: Disorder of kidney and ureter, unspecified: N28.9

## 2018-05-27 HISTORY — DX: Acute kidney failure, unspecified: N17.9

## 2018-05-27 LAB — CBC
HCT: 37.3 % — ABNORMAL LOW (ref 39.0–52.0)
Hemoglobin: 12.8 g/dL — ABNORMAL LOW (ref 13.0–17.0)
MCH: 30.3 pg (ref 26.0–34.0)
MCHC: 34.3 g/dL (ref 30.0–36.0)
MCV: 88.4 fL (ref 80.0–100.0)
Platelets: 141 10*3/uL — ABNORMAL LOW (ref 150–400)
RBC: 4.22 MIL/uL (ref 4.22–5.81)
RDW: 13 % (ref 11.5–15.5)
WBC: 10.7 10*3/uL — ABNORMAL HIGH (ref 4.0–10.5)
nRBC: 0 % (ref 0.0–0.2)

## 2018-05-27 LAB — CREATININE, SERUM
Creatinine, Ser: 1.83 mg/dL — ABNORMAL HIGH (ref 0.61–1.24)
GFR calc Af Amer: 40 mL/min — ABNORMAL LOW (ref >60)
GFR calc non Af Amer: 34 mL/min — ABNORMAL LOW (ref >60)

## 2018-05-27 MED ORDER — SODIUM CHLORIDE 0.9 % IV SOLN
1.0000 g | INTRAVENOUS | Status: DC
  Filled 2018-05-27: qty 10

## 2018-05-27 MED ORDER — HEPARIN SODIUM (PORCINE) 5000 UNIT/ML IJ SOLN
5000.0000 [IU] | Freq: Three times a day (TID) | INTRAMUSCULAR | Status: DC
  Administered 2018-05-27 – 2018-05-31 (×12): 5000 [IU] via SUBCUTANEOUS
  Filled 2018-05-27 (×12): qty 1

## 2018-05-27 MED ORDER — ACETAMINOPHEN 325 MG PO TABS
650.0000 mg | ORAL_TABLET | Freq: Four times a day (QID) | ORAL | Status: DC | PRN
  Administered 2018-05-28: 650 mg via ORAL
  Filled 2018-05-27: qty 2

## 2018-05-27 MED ORDER — ASPIRIN EC 81 MG PO TBEC
81.0000 mg | DELAYED_RELEASE_TABLET | Freq: Every day | ORAL | Status: DC
  Administered 2018-05-27 – 2018-05-31 (×5): 81 mg via ORAL
  Filled 2018-05-27 (×5): qty 1

## 2018-05-27 MED ORDER — ONDANSETRON HCL 4 MG PO TABS: 4.0000 mg | ORAL_TABLET | Freq: Four times a day (QID) | ORAL | Status: DC | PRN

## 2018-05-27 MED ORDER — SODIUM CHLORIDE 0.9% FLUSH: 3.0000 mL | INTRAVENOUS | Status: DC | PRN

## 2018-05-27 MED ORDER — SODIUM CHLORIDE 0.9 % IV SOLN: 250.0000 mL | INTRAVENOUS | Status: DC | PRN

## 2018-05-27 MED ORDER — ATORVASTATIN CALCIUM 40 MG PO TABS
40.0000 mg | ORAL_TABLET | Freq: Every day | ORAL | Status: DC
  Administered 2018-05-27 – 2018-05-31 (×5): 40 mg via ORAL
  Filled 2018-05-27 (×5): qty 1

## 2018-05-27 MED ORDER — SODIUM CHLORIDE 0.9 % IV SOLN
500.0000 mg | INTRAVENOUS | Status: DC
  Filled 2018-05-27: qty 500

## 2018-05-27 MED ORDER — SODIUM CHLORIDE 0.9 % IV SOLN
500.0000 mg | INTRAVENOUS | Status: DC
  Administered 2018-05-28 – 2018-05-29 (×2): 500 mg via INTRAVENOUS
  Filled 2018-05-27 (×3): qty 500

## 2018-05-27 MED ORDER — MOMETASONE FURO-FORMOTEROL FUM 200-5 MCG/ACT IN AERO
2.0000 | INHALATION_SPRAY | Freq: Two times a day (BID) | RESPIRATORY_TRACT | Status: DC
  Administered 2018-05-28 – 2018-05-29 (×3): 2 via RESPIRATORY_TRACT
  Filled 2018-05-27: qty 8.8

## 2018-05-27 MED ORDER — CLOPIDOGREL BISULFATE 75 MG PO TABS
75.0000 mg | ORAL_TABLET | Freq: Every day | ORAL | Status: DC
  Administered 2018-05-27 – 2018-05-30 (×4): 75 mg via ORAL
  Filled 2018-05-27 (×4): qty 1

## 2018-05-27 MED ORDER — TAMSULOSIN HCL 0.4 MG PO CAPS
0.4000 mg | ORAL_CAPSULE | Freq: Every day | ORAL | Status: DC
  Administered 2018-05-27 – 2018-05-31 (×5): 0.4 mg via ORAL
  Filled 2018-05-27 (×5): qty 1

## 2018-05-27 MED ORDER — SODIUM CHLORIDE 0.9% FLUSH
3.0000 mL | Freq: Two times a day (BID) | INTRAVENOUS | Status: DC
  Administered 2018-05-27 – 2018-05-30 (×6): 3 mL via INTRAVENOUS

## 2018-05-27 MED ORDER — TRAMADOL HCL 50 MG PO TABS
50.0000 mg | ORAL_TABLET | Freq: Four times a day (QID) | ORAL | Status: DC | PRN
  Administered 2018-05-27 – 2018-05-31 (×4): 50 mg via ORAL
  Filled 2018-05-27 (×4): qty 1

## 2018-05-27 MED ORDER — SODIUM CHLORIDE 0.9 % IV SOLN
1.0000 g | INTRAVENOUS | Status: DC
  Administered 2018-05-28 – 2018-05-29 (×2): 1 g via INTRAVENOUS
  Filled 2018-05-27 (×3): qty 10

## 2018-05-27 MED ORDER — ONDANSETRON HCL 4 MG/2ML IJ SOLN
4.0000 mg | Freq: Four times a day (QID) | INTRAMUSCULAR | Status: DC | PRN
  Administered 2018-05-28 – 2018-05-29 (×2): 4 mg via INTRAVENOUS
  Filled 2018-05-27 (×2): qty 2

## 2018-05-27 MED ORDER — SODIUM CHLORIDE 0.9 % IV SOLN
INTRAVENOUS | Status: DC
  Administered 2018-05-27 – 2018-05-28 (×2): via INTRAVENOUS

## 2018-05-27 MED ORDER — DONEPEZIL HCL 5 MG PO TABS
5.0000 mg | ORAL_TABLET | Freq: Every day | ORAL | Status: DC
  Administered 2018-05-27 – 2018-05-30 (×4): 5 mg via ORAL
  Filled 2018-05-27 (×5): qty 1

## 2018-05-27 MED ORDER — ROPINIROLE HCL 1 MG PO TABS
0.5000 mg | ORAL_TABLET | Freq: Three times a day (TID) | ORAL | Status: DC
  Administered 2018-05-27 – 2018-05-31 (×12): 0.5 mg via ORAL
  Filled 2018-05-27 (×12): qty 1

## 2018-05-27 NOTE — Progress Notes (Signed)
I was contacted by Dr. Maylene Roes regarding Mr. Chris Braun.   He has a 71mm right proximal stone with moderate obstruction.  He has by report AKI with a Cr of 1.81 but a negative urine.  His pain is managed with oral meds.   He was transferred to [facility] from [place] to r/o Covid with a fever and cough.     I have reviewed the available records and CT scan.    I recommended tamsulosin and since he is being well managed conservatively, will defer a formal consult until his Covid status has been confirmed.    Dr. Maylene Roes is in agreement with that plan.

## 2018-05-27 NOTE — Progress Notes (Signed)
Patient arrived via [place] from [facility].  Oriented to unit and educated on Covid r/o protocol.  Patient confused, bed alarm on.  Dr. Maylene Roes here to see patient.

## 2018-05-27 NOTE — H&P (Signed)
History and Physical    Chris Braun QQP:[id number] DOB: 12-Feb-1936 DOA: 05/27/2018  PCP: [name]., MD  Patient coming from: [facility] ED   Chief Complaint: Fever, cough   HPI: Chris Braun is a 83 y.o. male with medical history significant of CAD, HLD, dementia, COPD who presented to [facility] ED with chief complaints of fever, cough which is sometimes dry and sometimes wet. Denies SOB on my exam, no chest pain, nausea, vomiting, or diarrhea. Having some flank pain that radiates to his abdomen.   At baseline dementia, he can "function alright" but per wife he doesn't always remember things and does not "do well without me."   Work up at [place] ED:  WBC 11.3 Na 133 K 3.5 Cr 1.8  Lactic acid 1.2 Ferritin 96.1 LDH 493 CK 54 Troponin < 0.01  Procalcitonin 0.25 UA negative  Influenza negative  CTA C/A/P ill-defined opacity dependent RUL suspicious for pneumonia, moderate to advanced emphysema, obstructing 86mm stone right mid ureter with moderate hydroureteronephrosis. Stable infrarenal aortic aneurysm max dimension 4.3cm, recommend follow up US in 1 year. Colonic diverticulosis without acute inflammation.  CXR: low lung volumes without acute abnormality, mild bibasilar scarring  EKG independently reviewed: NSR without ST change   Review of Systems: As per HPI otherwise 10 point review of systems negative.   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   Osteoporosis    Stroke [facility])     Past Surgical History:  Procedure Laterality  Date   CARDIAC CATHETERIZATION  2002,   stents    CATARACT EXTRACTION Left 2016   Colonscopy  2012   HIP FRACTURE SURGERY  2008   right   HYDROCELE EXCISION  10/12/2014   LITHOTRIPSY     2007, 2009, 2010, 2011, 2012, 2013   LUMBAR LAMINECTOMY/DECOMPRESSION MICRODISCECTOMY  03/15/2012   Procedure: LUMBAR LAMINECTOMY/DECOMPRESSION MICRODISCECTOMY 1 LEVEL;  Surgeon: Elaina Hoops, MD;  Location: [facility] NEURO ORS;  Service: Neurosurgery;  Laterality: Left;  Left lumbar three-four decompressive lumbar laminectomy, discectomy   MAXIMUM ACCESS (MAS)POSTERIOR LUMBAR INTERBODY FUSION (PLIF) 2 LEVEL N/A 12/08/2012   Procedure: FOR MAXIMUM ACCESS (MAS) POSTERIOR LUMBAR INTERBODY FUSION (PLIF) 2 LEVEL;  Surgeon: Elaina Hoops, MD;  Location: [place] NEURO ORS;  Service: Neurosurgery;  Laterality: N/A;  FOR MAXIMUM ACCESS (MAS) POSTERIOR LUMBAR INTERBODY FUSION (PLIF) 2 LEVEL   ROTATOR CUFF REPAIR  1999   bil    SALIVARY STONE REMOVAL  1964   1964   VT study     with Ablation     reports that he quit smoking about 17 years ago. His smoking use included cigarettes. He has a 45.00 pack-year smoking history. His smokeless tobacco use includes chew. He reports that he does not drink alcohol or use drugs.  Allergies  Allergen Reactions   Other Nausea And Vomiting and Other (See Comments)    pneumonia vaccine- chills, vomiting, fever, lost use of legs and body function. Had a fall post inj. (05/25/2013)  Ibuprofen     Heart doctor advised he cannot take this   Oxycodone     Mental status changes    Family History  Problem Relation Age of Onset   Hemolytic uremic syndrome Mother    Stroke Father     Prior to Admission medications   Medication Sig Start Date End Date Taking? Authorizing Provider  albuterol (PROVENTIL HFA;VENTOLIN HFA) 108 (90 BASE) MCG/ACT inhaler Inhale 2 puffs into the lungs every 6 (six) hours as needed for wheezing.    [provider]  alendronate (FOSAMAX) 70 MG  tablet Take 70 mg by mouth every 7 (seven) days. Take with a full glass of water on an empty stomach. Takes on Friday.    [provider]  aspirin EC 81 MG tablet Take 81 mg by mouth daily.    [provider]  atorvastatin (LIPITOR) 40 MG tablet Take 40 mg by mouth daily.    [provider]  benzonatate (TESSALON) 200 MG capsule Take 200 mg by mouth 3 (three) times daily as needed for cough.    [provider]  Calcium Carb-Cholecalciferol (CALCIUM-VITAMIN D) 500-200 MG-UNIT tablet Take 1 tablet by mouth daily.    [provider]  clopidogrel (PLAVIX) 75 MG tablet Take 75 mg by mouth daily. 06/20/15   [provider]  Cyanocobalamin (B-12 PO) Take 2,500 mcg by mouth daily.    [provider]  donepezil (ARICEPT) 5 MG tablet Take 5 mg by mouth at bedtime.  11/05/15   [provider]  Fexofenadine HCl (MUCINEX ALLERGY PO) Take 1,200 mg by mouth as needed.    [provider]  HYDROcodone-acetaminophen (NORCO/VICODIN) 5-325 MG tablet Take 1 tablet by mouth every 6 (six) hours as needed for moderate pain.    [provider]  IRON PO Take 27 mg by mouth daily.    [provider]  loratadine (CLARITIN) 10 MG tablet Take 10 mg by mouth.    [provider]  Lutein 40 MG CAPS Take 40 mg by mouth daily.    [provider]  metoprolol succinate (TOPROL-XL) 25 MG 24 hr tablet Take 12.5 mg by mouth at bedtime.     [provider]  montelukast (SINGULAIR) 10 MG tablet Take 10 mg by mouth at bedtime.    [provider]  Multiple Vitamins-Minerals (MULTIVITAMIN PO) Take 1 tablet by mouth daily.    [provider]  nitroGLYCERIN (NITROSTAT) 0.4 MG SL tablet Place 1 tablet (0.4 mg total) under the tongue every 5 (five) minutes as needed for chest pain. 04/23/17 07/22/17  Revankar, Reita Cliche, MD  ondansetron (ZOFRAN) 4 MG tablet Take 4 mg by mouth every 8 (eight) hours as needed for  nausea or vomiting.    [provider]  ranitidine (ZANTAC) 150 MG tablet Take 150 mg by mouth 2 (two) times daily.    [provider]  rOPINIRole (REQUIP) 0.5 MG tablet Take 0.5 mg by mouth 3 (three) times daily. 09/27/16   [provider]  SYMBICORT 160-4.5 MCG/ACT inhaler Inhale 2 puffs into the lungs 2 (two) times daily. 07/21/16   [provider]  traMADol (ULTRAM) 50 MG tablet Take 50 mg by mouth every 6 (six) hours as needed.    [provider]  Vitamin D, Ergocalciferol, (DRISDOL) 50000 units CAPS capsule Take 50,000 Units by mouth once a week.    [provider]    Physical Exam: Vitals:   05/27/18 1008 05/27/18 1011  BP:  114/72  Pulse:  63  Resp:  11  Temp:  98.7 F (37.1 C)  TempSrc:  Oral  SpO2:  97%  Weight: 73.7 kg   Height: 5\' 9"  (1.753 m)     Constitutional: NAD, calm, comfortable Eyes: PERRL, lids and conjunctivae normal ENMT: Mucous membranes are moist. Posterior pharynx clear of any exudate or lesions.Normal dentition.  Neck: normal, supple, no masses, no thyromegaly Respiratory: diminished but clear bilaterally, no respiratory distress, on 2L [place] O2  Cardiovascular: Regular rate and rhythm, no murmurs / rubs / gallops. +1 pedal edema Abdomen: no tenderness, no masses palpated. No hepatosplenomegaly. Bowel sounds positive.  Musculoskeletal: no clubbing / cyanosis. Good ROM, no contractures. Normal muscle tone.  Skin: no rashes, lesions, ulcers on exposed skin  Neurologic: Nonfocal, speech clear  Psychiatric: Normal judgment and insight. Alert and oriented x 3. Normal mood. Report of dementia but oriented on my exam   Labs on Admission: I have personally reviewed following labs and imaging studies  CBC: No results for input(s): WBC, NEUTROABS, HGB, HCT, MCV, PLT in the last 168 hours. Basic Metabolic Panel: No results for input(s): NA, K, CL, CO2, GLUCOSE, BUN, CREATININE, CALCIUM, MG, PHOS in the last 168  hours. GFR: CrCl cannot be calculated (Patient's most recent lab result is older than the maximum 21 days allowed.). Liver Function Tests: No results for input(s): AST, ALT, ALKPHOS, BILITOT, PROT, ALBUMIN in the last 168 hours. No results for input(s): LIPASE, AMYLASE in the last 168 hours. No results for input(s): AMMONIA in the last 168 hours. Coagulation Profile: No results for input(s): INR, PROTIME in the last 168 hours. Cardiac Enzymes: No results for input(s): CKTOTAL, CKMB, CKMBINDEX, TROPONINI in the last 168 hours. BNP (last 3 results) No results for input(s): PROBNP in the last 8760 hours. HbA1C: No results for input(s): HGBA1C in the last 72 hours. CBG: No results for input(s): GLUCAP in the last 168 hours. Lipid Profile: No results for input(s): CHOL, HDL, LDLCALC, TRIG, CHOLHDL, LDLDIRECT in the last 72 hours. Thyroid Function Tests: No results for input(s): TSH, T4TOTAL, FREET4, T3FREE, THYROIDAB in the last 72 hours. Anemia Panel: No results for input(s): VITAMINB12, FOLATE, FERRITIN, TIBC, IRON, RETICCTPCT in the last 72 hours. Urine analysis: No results found for: COLORURINE, APPEARANCEUR, LABSPEC, PHURINE, GLUCOSEU, HGBUR, BILIRUBINUR, KETONESUR, PROTEINUR, UROBILINOGEN, NITRITE, LEUKOCYTESUR Sepsis Labs: !!!!!!!!!!!!!!!!!!!!!!!!!!!!!!!!!!!!!!!!!!!! @LABRCNTIP (procalcitonin:4,lacticidven:4) )No results found for this or any previous visit (from the past 240 hour(s)).   Radiological Exams on Admission: No results found.  EKG independently reviewed: NSR without ST change   Assessment/Plan Principal Problem:   Acute hypoxemic respiratory failure (HCC) Active Problems:   CAD (coronary artery disease)   Dyslipidemia   AKI (acute kidney injury) ([place])   Right kidney stone   Right upper lobe pneumonia (HCC)   Acute hypoxemic respiratory failure -SpO2 70s on room air in the ED, requires 2L  O2 prior to transfer -COVID-19 testing ordered at [facility].  Currently on low risk / droplet and contact PPE   RUL CAP -Rocephin/azithromycin   AKI -Baseline Cr 1.1 -Cr 1.8 in [place]  -IVF   Right kidney stone -Obstructing 17mm stone right mid ureter with moderate hydroureteronephrosis -Follows with [facility] -Spoke with Urology Dr. Jeffie Pollock for recommendations. Patient's pain is currently well controlled. He recommended conservative tx, flomax, IVF. As long as his kidney function improves and pain well controlled, will hold off on any procedures due to COVID-19 test pending   CAD -Aspirin, Plavix  HLD -Lipitor  Dementia -  Aricept  HTN -Toprol on hold due to mild bradycardia on admission   Infrarenal aortic aneurysm -Stable infrarenal aortic aneurysm max dimension 4.3cm, recommend follow up US in 1 year    PPE worn by physician during exam: N95/Surgical mask, face shield, gown, gloves, and hair bouffant.    DVT prophylaxis: Subq hep Code Status: Full, confirmed with wife over the phone   Family Communication: Wife over the phone Disposition Plan: Pending improvement in respiratory status Consults called: Urology over the phone only, hold off on formal consult until COVID rule out Admission status: Inpatient    Severity of Illness: The appropriate patient status for this patient is INPATIENT. Inpatient status is judged to be reasonable and necessary in order to provide the required intensity of service to ensure the patient's safety. The patient's presenting symptoms, physical exam findings, and initial radiographic and laboratory data in the context of their chronic comorbidities is felt to place them at high risk for further clinical deterioration. Furthermore, it is not anticipated that the patient will be medically stable for discharge from the hospital within 2 midnights of admission. The following factors support the patient status of inpatient.   " The patient's presenting symptoms include fever, cough. " The worrisome  physical exam findings include new O2 requirement of 2L. " The initial radiographic and laboratory data are worrisome because of possible RUL pneumonia as well as right sided obstructing kidney stone. " The chronic co-morbidities include dementia, CAD, HLD.   * I certify that at the point of admission it is my clinical judgment that the patient will require inpatient hospital care spanning beyond 2 midnights from the point of admission due to high intensity of service, high risk for further deterioration and high frequency of surveillance required.Dessa Phi, DO [facility] 05/27/2018, 11:26 AM    How to contact the [facility] Attending or Consulting provider [place] or covering provider during after hours [place], for this patient?  1. Check the care team in [facility] and look for a) attending/consulting [facility] provider listed and b) the [facility] team listed 2. Log into [email or web address] and use [place] universal password to access. If you do not have the password, please contact the hospital operator. 3. Locate the [facility] provider you are looking for under [facility] and page to a number that you can be directly reached. 4. If you still have difficulty reaching the provider, please page the [facility] (Director on Call) for the Hospitalists listed on amion for assistance.

## 2018-05-28 LAB — CBC WITH DIFFERENTIAL/PLATELET
Abs Immature Granulocytes: 0.06 10*3/uL (ref 0.00–0.07)
Basophils Absolute: 0.1 10*3/uL (ref 0.0–0.1)
Basophils Relative: 0 %
Eosinophils Absolute: 0.2 10*3/uL (ref 0.0–0.5)
Eosinophils Relative: 2 %
HCT: 35.5 % — ABNORMAL LOW (ref 39.0–52.0)
Hemoglobin: 12.2 g/dL — ABNORMAL LOW (ref 13.0–17.0)
Immature Granulocytes: 1 %
Lymphocytes Relative: 10 %
Lymphs Abs: 1.1 10*3/uL (ref 0.7–4.0)
MCH: 30.7 pg (ref 26.0–34.0)
MCHC: 34.4 g/dL (ref 30.0–36.0)
MCV: 89.4 fL (ref 80.0–100.0)
Monocytes Absolute: 1.2 10*3/uL — ABNORMAL HIGH (ref 0.1–1.0)
Monocytes Relative: 11 %
Neutro Abs: 8.5 10*3/uL — ABNORMAL HIGH (ref 1.7–7.7)
Neutrophils Relative %: 76 %
Platelets: 135 10*3/uL — ABNORMAL LOW (ref 150–400)
RBC: 3.97 MIL/uL — ABNORMAL LOW (ref 4.22–5.81)
RDW: 12.8 % (ref 11.5–15.5)
WBC: 11.1 10*3/uL — ABNORMAL HIGH (ref 4.0–10.5)
nRBC: 0 % (ref 0.0–0.2)

## 2018-05-28 LAB — COMPREHENSIVE METABOLIC PANEL
ALT: 15 U/L (ref 0–44)
AST: 20 U/L (ref 15–41)
Albumin: 2.4 g/dL — ABNORMAL LOW (ref 3.5–5.0)
Alkaline Phosphatase: 77 U/L (ref 38–126)
Anion gap: 13 (ref 5–15)
BUN: 14 mg/dL (ref 8–23)
CO2: 22 mmol/L (ref 22–32)
Calcium: 8.1 mg/dL — ABNORMAL LOW (ref 8.9–10.3)
Chloride: 105 mmol/L (ref 98–111)
Creatinine, Ser: 1.72 mg/dL — ABNORMAL HIGH (ref 0.61–1.24)
GFR calc Af Amer: 43 mL/min — ABNORMAL LOW (ref >60)
GFR calc non Af Amer: 37 mL/min — ABNORMAL LOW (ref >60)
Glucose, Bld: 87 mg/dL (ref 70–99)
Potassium: 4 mmol/L (ref 3.5–5.1)
Sodium: 140 mmol/L (ref 135–145)
Total Bilirubin: 0.9 mg/dL (ref 0.3–1.2)
Total Protein: 5.3 g/dL — ABNORMAL LOW (ref 6.5–8.1)

## 2018-05-28 MED ORDER — POLYETHYLENE GLYCOL 3350 17 G PO PACK
17.0000 g | PACK | Freq: Every day | ORAL | Status: DC
  Administered 2018-05-28 – 2018-05-31 (×4): 17 g via ORAL
  Filled 2018-05-28 (×4): qty 1

## 2018-05-28 MED ORDER — HYDROCODONE-ACETAMINOPHEN 5-325 MG PO TABS
1.0000 | ORAL_TABLET | Freq: Four times a day (QID) | ORAL | Status: DC | PRN
  Administered 2018-05-30: 1 via ORAL
  Filled 2018-05-28 (×2): qty 1

## 2018-05-28 MED ORDER — MONTELUKAST SODIUM 10 MG PO TABS
10.0000 mg | ORAL_TABLET | Freq: Every day | ORAL | Status: DC
  Administered 2018-05-28 – 2018-05-30 (×3): 10 mg via ORAL
  Filled 2018-05-28 (×3): qty 1

## 2018-05-28 MED ORDER — SENNOSIDES-DOCUSATE SODIUM 8.6-50 MG PO TABS
1.0000 | ORAL_TABLET | Freq: Two times a day (BID) | ORAL | Status: DC
  Administered 2018-05-28 – 2018-05-31 (×7): 1 via ORAL
  Filled 2018-05-28 (×7): qty 1

## 2018-05-28 MED ORDER — BENZONATATE 100 MG PO CAPS
100.0000 mg | ORAL_CAPSULE | Freq: Two times a day (BID) | ORAL | Status: DC
  Administered 2018-05-28 – 2018-05-31 (×7): 100 mg via ORAL
  Filled 2018-05-28 (×7): qty 1

## 2018-05-28 MED ORDER — METOPROLOL SUCCINATE ER 25 MG PO TB24
12.5000 mg | ORAL_TABLET | Freq: Every day | ORAL | Status: DC
  Administered 2018-05-28 – 2018-05-30 (×2): 12.5 mg via ORAL
  Filled 2018-05-28 (×3): qty 1

## 2018-05-28 NOTE — Plan of Care (Signed)
  Problem: Clinical Measurements: Goal: Diagnostic test results will improve Outcome: Progressing   Problem: Clinical Measurements: Goal: Respiratory complications will improve Outcome: Progressing   Problem: Clinical Measurements: Goal: Cardiovascular complication will be avoided Outcome: Progressing   Problem: Nutrition: Goal: Adequate nutrition will be maintained Outcome: Progressing   Problem: Coping: Goal: Level of anxiety will decrease Outcome: Progressing   Problem: Elimination: Goal: Will not experience complications related to urinary retention Outcome: Progressing   Problem: Pain Managment: Goal: General experience of comfort will improve Outcome: Progressing   Problem: Safety: Goal: Ability to remain free from injury will improve Outcome: Progressing   Problem: Skin Integrity: Goal: Risk for impaired skin integrity will decrease Outcome: Progressing

## 2018-05-28 NOTE — Progress Notes (Signed)
[facility] Progress Note  Patient: Chris Braun OIN:[id number]   PCP: [name]., MD DOB: 1935/03/02   DOA: 05/27/2018   DOS: 05/28/2018   Date of Service: the patient was seen and examined on 05/28/2018  Brief hospital course: Pt. with PMH of CAD, HLD, dementia, COPD, chronic diastolic CHF; admitted on 05/27/2018, presented with complaint of cough and shortness of breath, was found to have Community-acquired pneumonia. Currently further plan is continue current treatment.  Subjective: No further right flank pain.  No nausea no vomiting.  Still has shortness of breath and cough.  No chest pain.  Assessment and Plan: Acute hypoxemic respiratory failure -SpO2 70s on room air in the ED, requires 2L [place] O2 prior to transfer -COVID-19 testing ordered at [facility]. Currently on low risk / droplet and contact PPE   RUL CAP -Rocephin/azithromycin   AKI -Baseline Cr 1.1 -Cr 1.8 in [place]  -IVF   Right kidney stone -Obstructing 36mm stone right mid ureter with moderate hydroureteronephrosis -Follows with [facility] -Spoke with Urology Dr. Jeffie Pollock for recommendations. Patient's pain is currently well controlled. He recommended conservative tx, flomax, IVF. As long as his kidney function improves and pain well controlled, will hold off on any procedures due to COVID-19 test pending   CAD -Aspirin, Plavix  HLD -Lipitor  Dementia -Aricept  HTN -Toprol on hold due to mild bradycardia on admission   Infrarenal aortic aneurysm -Stable infrarenal aortic aneurysm max dimension 4.3cm, recommend follow up US in 1 year  Diet: Cardiac diet DVT Prophylaxis: subcutaneous Heparin  Advance goals of care discussion: Full code  Family Communication: no family was present at bedside, at the time of interview. The pt provided permission to discuss medical plan with the family. Opportunity was given to ask question and all questions were answered satisfactorily.   Disposition:   Discharge to be determined.  Consultants: Urology Procedures: none  Scheduled Meds: . aspirin EC  81 mg Oral Daily  . atorvastatin  40 mg Oral Daily  . benzonatate  100 mg Oral BID  . clopidogrel  75 mg Oral Daily  . donepezil  5 mg Oral QHS  . heparin  5,000 Units Subcutaneous Q8H  . metoprolol succinate  12.5 mg Oral QHS  . mometasone-formoterol  2 puff Inhalation BID  . montelukast  10 mg Oral QHS  . polyethylene glycol  17 g Oral Daily  . rOPINIRole  0.5 mg Oral TID  . senna-docusate  1 tablet Oral BID  . sodium chloride flush  3 mL Intravenous Q12H  . tamsulosin  0.4 mg Oral Daily   Continuous Infusions: . sodium chloride    . azithromycin 250 mL/hr at 05/28/18 0640  . cefTRIAXone (ROCEPHIN)  IV Stopped (05/28/18 0502)   PRN Meds: sodium chloride, acetaminophen, HYDROcodone-acetaminophen, ondansetron **OR** ondansetron (ZOFRAN) IV, sodium chloride flush, traMADol Antibiotics: Anti-infectives (From admission, onward)   Start     Dose/Rate Route Frequency Ordered Stop   05/28/18 0600  azithromycin (ZITHROMAX) 500 mg in sodium chloride 0.9 % 250 mL IVPB     500 mg 250 mL/hr over 60 Minutes Intravenous Every 24 hours 05/27/18 1317     05/28/18 0500  cefTRIAXone (ROCEPHIN) 1 g in sodium chloride 0.9 % 100 mL IVPB     1 g 200 mL/hr over 30 Minutes Intravenous Every 24 hours 05/27/18 1317     05/27/18 1230  cefTRIAXone (ROCEPHIN) 1 g in sodium chloride 0.9 % 100 mL IVPB  Status:  Discontinued  1 g 200 mL/hr over 30 Minutes Intravenous Every 24 hours 05/27/18 1124 05/27/18 1317   05/27/18 1230  azithromycin (ZITHROMAX) 500 mg in sodium chloride 0.9 % 250 mL IVPB  Status:  Discontinued     500 mg 250 mL/hr over 60 Minutes Intravenous Every 24 hours 05/27/18 1124 05/27/18 1317       Objective: Physical Exam: Vitals:   05/27/18 1532 05/27/18 2210 05/28/18 0830 05/28/18 1013  BP:   133/73 113/73  Pulse:   71 89  Resp:   13 14  Temp: 98.3 F (36.8 C) 98.5 F (36.9 C)   97.7 F (36.5 C)  TempSrc: Oral Oral  Oral  SpO2:   95% 94%  Weight:      Height:        Intake/Output Summary (Last 24 hours) at 05/28/2018 1450 Last data filed at 05/28/2018 0830 Gross per 24 hour  Intake 2433.85 ml  Output 850 ml  Net 1583.85 ml   Filed Weights   05/27/18 1008  Weight: 73.7 kg   General: Alert, Awake and Oriented to Time, Place and Person. Appear in mild distress, affect appropriate Eyes: PERRL, Conjunctiva normal ENT: Oral Mucosa clear moist. Neck: no JVD, no Abnormal Mass Or lumps Cardiovascular: S1 and S2 Present, no Murmur, Peripheral Pulses Present Respiratory: normal respiratory effort, Bilateral Air entry equal and Decreased, no use of accessory muscle, bilateral Crackles, no wheezes Abdomen: Bowel Sound present, Soft and no tenderness, no hernia Skin: no redness, no Rash, no induration Extremities: n Pedal edema, no calf tenderness Neurologic: Grossly no focal neuro deficit. Bilaterally Equal motor strength  Data Reviewed: CBC: Recent Labs  Lab 05/27/18 1136 05/28/18 0552  WBC 10.7* 11.1*  NEUTROABS  --  8.5*  HGB 12.8* 12.2*  HCT 37.3* 35.5*  MCV 88.4 89.4  PLT 141* 536*   Basic Metabolic Panel: Recent Labs  Lab 05/27/18 1136 05/28/18 0552  NA  --  140  K  --  4.0  CL  --  105  CO2  --  22  GLUCOSE  --  87  BUN  --  14  CREATININE 1.83* 1.72*  CALCIUM  --  8.1*    Liver Function Tests: Recent Labs  Lab 05/28/18 0552  AST 20  ALT 15  ALKPHOS 77  BILITOT 0.9  PROT 5.3*  ALBUMIN 2.4*   No results for input(s): LIPASE, AMYLASE in the last 168 hours. No results for input(s): AMMONIA in the last 168 hours. Coagulation Profile: No results for input(s): INR, PROTIME in the last 168 hours. Cardiac Enzymes: No results for input(s): CKTOTAL, CKMB, CKMBINDEX, TROPONINI in the last 168 hours. BNP (last 3 results) No results for input(s): PROBNP in the last 8760 hours. CBG: No results for input(s): GLUCAP in the last 168  hours. Studies: No results found.   Time spent: 35 minutes  Author: Berle Mull, MD [facility] 05/28/2018 2:50 PM  To reach On-call, see care teams to locate the attending and reach out to them via [email or web address]. If 7PM-7AM, please contact night-coverage If you still have difficulty reaching the attending provider, please page the [facility] (Director on Call) for [facility] on amion for assistance.

## 2018-05-29 ENCOUNTER — Inpatient Hospital Stay (HOSPITAL_COMMUNITY): Payer: PPO

## 2018-05-29 LAB — CBC WITH DIFFERENTIAL/PLATELET
Abs Immature Granulocytes: 0.04 10*3/uL (ref 0.00–0.07)
Basophils Absolute: 0.1 10*3/uL (ref 0.0–0.1)
Basophils Relative: 1 %
Eosinophils Absolute: 0.2 10*3/uL (ref 0.0–0.5)
Eosinophils Relative: 2 %
HCT: 34.3 % — ABNORMAL LOW (ref 39.0–52.0)
Hemoglobin: 11.6 g/dL — ABNORMAL LOW (ref 13.0–17.0)
Immature Granulocytes: 0 %
Lymphocytes Relative: 10 %
Lymphs Abs: 1 10*3/uL (ref 0.7–4.0)
MCH: 29.7 pg (ref 26.0–34.0)
MCHC: 33.8 g/dL (ref 30.0–36.0)
MCV: 87.9 fL (ref 80.0–100.0)
Monocytes Absolute: 1.1 10*3/uL — ABNORMAL HIGH (ref 0.1–1.0)
Monocytes Relative: 11 %
Neutro Abs: 7.8 10*3/uL — ABNORMAL HIGH (ref 1.7–7.7)
Neutrophils Relative %: 76 %
Platelets: 157 10*3/uL (ref 150–400)
RBC: 3.9 MIL/uL — ABNORMAL LOW (ref 4.22–5.81)
RDW: 12.8 % (ref 11.5–15.5)
WBC: 10.1 10*3/uL (ref 4.0–10.5)
nRBC: 0 % (ref 0.0–0.2)

## 2018-05-29 LAB — COMPREHENSIVE METABOLIC PANEL
ALT: 17 U/L (ref 0–44)
AST: 23 U/L (ref 15–41)
Albumin: 2.3 g/dL — ABNORMAL LOW (ref 3.5–5.0)
Alkaline Phosphatase: 78 U/L (ref 38–126)
Anion gap: 10 (ref 5–15)
BUN: 18 mg/dL (ref 8–23)
CO2: 24 mmol/L (ref 22–32)
Calcium: 8.2 mg/dL — ABNORMAL LOW (ref 8.9–10.3)
Chloride: 105 mmol/L (ref 98–111)
Creatinine, Ser: 1.71 mg/dL — ABNORMAL HIGH (ref 0.61–1.24)
GFR calc Af Amer: 43 mL/min — ABNORMAL LOW (ref >60)
GFR calc non Af Amer: 37 mL/min — ABNORMAL LOW (ref >60)
Glucose, Bld: 107 mg/dL — ABNORMAL HIGH (ref 70–99)
Potassium: 4 mmol/L (ref 3.5–5.1)
Sodium: 139 mmol/L (ref 135–145)
Total Bilirubin: 1 mg/dL (ref 0.3–1.2)
Total Protein: 5.3 g/dL — ABNORMAL LOW (ref 6.5–8.1)

## 2018-05-29 LAB — D-DIMER, QUANTITATIVE: D-Dimer, Quant: 2.29 ug/mL-FEU — ABNORMAL HIGH (ref 0.00–0.50)

## 2018-05-29 LAB — HEMOGLOBIN AND HEMATOCRIT, BLOOD
HCT: 29 % — ABNORMAL LOW (ref 39.0–52.0)
Hemoglobin: 9.3 g/dL — ABNORMAL LOW (ref 13.0–17.0)

## 2018-05-29 LAB — FERRITIN: Ferritin: 123 ng/mL (ref 24–336)

## 2018-05-29 LAB — C-REACTIVE PROTEIN: CRP: 11.3 mg/dL — ABNORMAL HIGH (ref <1.0)

## 2018-05-29 MED ORDER — AZITHROMYCIN 500 MG PO TABS
500.0000 mg | ORAL_TABLET | Freq: Every day | ORAL | Status: DC
  Administered 2018-05-30: 500 mg via ORAL
  Filled 2018-05-29: qty 1

## 2018-05-29 MED ORDER — CEPHALEXIN 500 MG PO CAPS
500.0000 mg | ORAL_CAPSULE | Freq: Three times a day (TID) | ORAL | Status: DC
  Administered 2018-05-29 – 2018-05-30 (×3): 500 mg via ORAL
  Filled 2018-05-29 (×3): qty 1

## 2018-05-29 MED ORDER — SODIUM CHLORIDE 0.9 % IV BOLUS
500.0000 mL | Freq: Once | INTRAVENOUS | Status: AC
  Administered 2018-05-29: 500 mL via INTRAVENOUS

## 2018-05-29 NOTE — Progress Notes (Signed)
Tx pt to 5W, report given to 5W RN.

## 2018-05-29 NOTE — Progress Notes (Signed)
Patient transferred to 5W-04 from [place]. Alert and oriented x 3. Disoriented to situation. Patient placed in chair and dinner tray provided. Denies any pain or complaints. Oriented to room and unit expectations. Skin assessment completed and no new injuries noted. Agree with previous RN's assessment. Will continue to monitor.  Hiram Comber, RN 05/29/2018 4:56 PM

## 2018-05-29 NOTE — Progress Notes (Signed)
[facility] Progress Note  Patient: Chris Braun:[id number]   PCP: [name]., MD DOB: [birth year]   DOA: 05/27/2018   DOS: 05/29/2018   Date of Service: the patient was seen and examined on 05/29/2018  Brief hospital course: Pt. with PMH of CAD, HLD, dementia, COPD, chronic diastolic CHF; admitted on 05/27/2018, presented with complaint of cough and shortness of breath, was found to have Community-acquired pneumonia. Currently further plan is continue current treatment.  Subjective: Denies any acute complaint no nausea no vomiting no fever no chills.  No chest pain abdominal pain.  Did have some right flank pain last night.  Assessment and Plan: Acute hypoxemic respiratory failure -SpO2 70s on room air in the ED, requires 2L [place] O2 prior to transfer -COVID-19 testing negative.  Currently on room air.  Likely is chronic COPD.  RUL CAP -Rocephin/azithromycin  change to oral antibiotics now.  AKI -Baseline Cr 1.1 -Cr 1.8 in [place]  -Initially IV fluids were given.  Now stopped. Renal function still stable.  Output is adequate.  Will monitor overnight before discharge to ensure stability of renal function  Right kidney stone -Obstructing 76mm stone right mid ureter with moderate hydroureteronephrosis -Follows with  urology -Spoke with Urology Dr. Jeffie Pollock for recommendations. Patient's pain is currently well controlled. He recommended conservative tx, flomax, IVF. As long as his kidney function improves and pain well controlled, will hold off on any procedures. Recommend outpatient follow-up with patient's primary urologist.  CAD -Aspirin, Plavix  HLD -Lipitor  Dementia -Aricept  HTN -Toprol on hold due to mild bradycardia on admission   Infrarenal aortic aneurysm -Stable infrarenal aortic aneurysm max dimension 4.3cm, recommend follow up US in 1 year  Diet: Cardiac diet DVT Prophylaxis: subcutaneous Heparin  Advance goals of care discussion: Full  code  Family Communication: no family was present at bedside, at the time of interview. The pt provided permission to discuss medical plan with the family. Opportunity was given to ask question and all questions were answered satisfactorily.   Disposition:  Discharge to be determined.  Consultants: Urology Procedures: none  Scheduled Meds: . aspirin EC  81 mg Oral Daily  . atorvastatin  40 mg Oral Daily  . [START ON 05/30/2018] azithromycin  500 mg Oral Daily  . benzonatate  100 mg Oral BID  . cephALEXin  500 mg Oral Q8H  . clopidogrel  75 mg Oral Daily  . donepezil  5 mg Oral QHS  . heparin  5,000 Units Subcutaneous Q8H  . metoprolol succinate  12.5 mg Oral QHS  . mometasone-formoterol  2 puff Inhalation BID  . montelukast  10 mg Oral QHS  . polyethylene glycol  17 g Oral Daily  . rOPINIRole  0.5 mg Oral TID  . senna-docusate  1 tablet Oral BID  . sodium chloride flush  3 mL Intravenous Q12H  . tamsulosin  0.4 mg Oral Daily   Continuous Infusions: . sodium chloride     PRN Meds: sodium chloride, acetaminophen, HYDROcodone-acetaminophen, ondansetron **OR** ondansetron (ZOFRAN) IV, sodium chloride flush, traMADol Antibiotics: Anti-infectives (From admission, onward)   Start     Dose/Rate Route Frequency Ordered Stop   05/30/18 1000  azithromycin (ZITHROMAX) tablet 500 mg     500 mg Oral Daily 05/29/18 1226     05/29/18 1400  cephALEXin (KEFLEX) capsule 500 mg     500 mg Oral Every 8 hours 05/29/18 1226     05/28/18 0600  azithromycin (ZITHROMAX) 500 mg in sodium chloride 0.9 %  250 mL IVPB  Status:  Discontinued     500 mg 250 mL/hr over 60 Minutes Intravenous Every 24 hours 05/27/18 1317 05/29/18 1225   05/28/18 0500  cefTRIAXone (ROCEPHIN) 1 g in sodium chloride 0.9 % 100 mL IVPB  Status:  Discontinued     1 g 200 mL/hr over 30 Minutes Intravenous Every 24 hours 05/27/18 1317 05/29/18 1225   05/27/18 1230  cefTRIAXone (ROCEPHIN) 1 g in sodium chloride 0.9 % 100 mL IVPB   Status:  Discontinued     1 g 200 mL/hr over 30 Minutes Intravenous Every 24 hours 05/27/18 1124 05/27/18 1317   05/27/18 1230  azithromycin (ZITHROMAX) 500 mg in sodium chloride 0.9 % 250 mL IVPB  Status:  Discontinued     500 mg 250 mL/hr over 60 Minutes Intravenous Every 24 hours 05/27/18 1124 05/27/18 1317       Objective: Physical Exam: Vitals:   05/28/18 2014 05/28/18 2045 05/29/18 0353 05/29/18 0813  BP: 130/76  129/71 127/72  Pulse: 83 78 76   Resp: 20 19 (!) 22 17  Temp: 97.6 F (36.4 C)  98.5 F (36.9 C)   TempSrc: Oral  Oral   SpO2: 96% 96% 95%   Weight:   74.5 kg   Height:        Intake/Output Summary (Last 24 hours) at 05/29/2018 1857 Last data filed at 05/29/2018 0900 Gross per 24 hour  Intake 240 ml  Output 600 ml  Net -360 ml   Filed Weights   05/27/18 1008 05/29/18 0353  Weight: 73.7 kg 74.5 kg   General: Alert, Awake and Oriented to Time, Place and Person. Appear in mild distress, affect appropriate Eyes: PERRL, Conjunctiva normal ENT: Oral Mucosa clear moist. Neck: no JVD, no Abnormal Mass Or lumps Cardiovascular: S1 and S2 Present, no Murmur, Peripheral Pulses Present Respiratory: normal respiratory effort, Bilateral Air entry equal and Decreased, no use of accessory muscle, bilateral Crackles, no wheezes Abdomen: Bowel Sound present, Soft and no tenderness, no hernia Skin: no redness, no Rash, no induration Extremities: n Pedal edema, no calf tenderness Neurologic: Grossly no focal neuro deficit. Bilaterally Equal motor strength  Data Reviewed: CBC: Recent Labs  Lab 05/27/18 1136 05/28/18 0552 05/29/18 0313  WBC 10.7* 11.1* 10.1  NEUTROABS  --  8.5* 7.8*  HGB 12.8* 12.2* 11.6*  HCT 37.3* 35.5* 34.3*  MCV 88.4 89.4 87.9  PLT 141* 135* 161   Basic Metabolic Panel: Recent Labs  Lab 05/27/18 1136 05/28/18 0552 05/29/18 0313  NA  --  140 139  K  --  4.0 4.0  CL  --  105 105  CO2  --  22 24  GLUCOSE  --  87 107*  BUN  --  14 18   CREATININE 1.83* 1.72* 1.71*  CALCIUM  --  8.1* 8.2*    Liver Function Tests: Recent Labs  Lab 05/28/18 0552 05/29/18 0313  AST 20 23  ALT 15 17  ALKPHOS 77 78  BILITOT 0.9 1.0  PROT 5.3* 5.3*  ALBUMIN 2.4* 2.3*   No results for input(s): LIPASE, AMYLASE in the last 168 hours. No results for input(s): AMMONIA in the last 168 hours. Coagulation Profile: No results for input(s): INR, PROTIME in the last 168 hours. Cardiac Enzymes: No results for input(s): CKTOTAL, CKMB, CKMBINDEX, TROPONINI in the last 168 hours. BNP (last 3 results) No results for input(s): PROBNP in the last 8760 hours. CBG: No results for input(s): GLUCAP in the last 168 hours.  Studies: No results found.   Time spent: 35 minutes  Author: Berle Mull, MD [facility] 05/29/2018 6:57 PM  To reach On-call, see care teams to locate the attending and reach out to them via [email or web address]. If 7PM-7AM, please contact night-coverage If you still have difficulty reaching the attending provider, please page the [facility] (Director on Call) for [facility] on amion for assistance.

## 2018-05-30 ENCOUNTER — Encounter (HOSPITAL_COMMUNITY): Payer: Self-pay | Admitting: Urology

## 2018-05-30 ENCOUNTER — Encounter (HOSPITAL_COMMUNITY)
Admission: AD | Disposition: A | Discharge status: Home / Self Care | Payer: Self-pay | Source: Other Acute Inpatient Hospital | Attending: Internal Medicine

## 2018-05-30 ENCOUNTER — Inpatient Hospital Stay (HOSPITAL_COMMUNITY): Payer: PPO | Admitting: Registered Nurse

## 2018-05-30 ENCOUNTER — Inpatient Hospital Stay (HOSPITAL_COMMUNITY): Payer: PPO

## 2018-05-30 DIAGNOSIS — N179 Acute kidney failure, unspecified: Secondary | ICD-10-CM

## 2018-05-30 DIAGNOSIS — N132 Hydronephrosis with renal and ureteral calculous obstruction: Secondary | ICD-10-CM

## 2018-05-30 DIAGNOSIS — J181 Lobar pneumonia, unspecified organism: Secondary | ICD-10-CM

## 2018-05-30 DIAGNOSIS — I2510 Atherosclerotic heart disease of native coronary artery without angina pectoris: Secondary | ICD-10-CM

## 2018-05-30 HISTORY — PX: CYSTOSCOPY W/ URETERAL STENT PLACEMENT: SHX1429

## 2018-05-30 LAB — SURGICAL PCR SCREEN
MRSA, PCR: NEGATIVE
Staphylococcus aureus: NEGATIVE

## 2018-05-30 LAB — CBC WITH DIFFERENTIAL/PLATELET
Abs Immature Granulocytes: 0.09 10*3/uL — ABNORMAL HIGH (ref 0.00–0.07)
Basophils Absolute: 0 10*3/uL (ref 0.0–0.1)
Basophils Relative: 0 %
Eosinophils Absolute: 0 10*3/uL (ref 0.0–0.5)
Eosinophils Relative: 0 %
HCT: 27.3 % — ABNORMAL LOW (ref 39.0–52.0)
Hemoglobin: 8.8 g/dL — ABNORMAL LOW (ref 13.0–17.0)
Immature Granulocytes: 1 %
Lymphocytes Relative: 9 %
Lymphs Abs: 0.8 10*3/uL (ref 0.7–4.0)
MCH: 29.2 pg (ref 26.0–34.0)
MCHC: 32.2 g/dL (ref 30.0–36.0)
MCV: 90.7 fL (ref 80.0–100.0)
Monocytes Absolute: 0.7 10*3/uL (ref 0.1–1.0)
Monocytes Relative: 8 %
Neutro Abs: 7.2 10*3/uL (ref 1.7–7.7)
Neutrophils Relative %: 82 %
Platelets: 182 10*3/uL (ref 150–400)
RBC: 3.01 MIL/uL — ABNORMAL LOW (ref 4.22–5.81)
RDW: 12.8 % (ref 11.5–15.5)
WBC: 8.7 10*3/uL (ref 4.0–10.5)
nRBC: 0 % (ref 0.0–0.2)

## 2018-05-30 LAB — COMPREHENSIVE METABOLIC PANEL
ALT: 20 U/L (ref 0–44)
AST: 25 U/L (ref 15–41)
Albumin: 2.1 g/dL — ABNORMAL LOW (ref 3.5–5.0)
Alkaline Phosphatase: 58 U/L (ref 38–126)
Anion gap: 13 (ref 5–15)
BUN: 71 mg/dL — ABNORMAL HIGH (ref 8–23)
CO2: 22 mmol/L (ref 22–32)
Calcium: 8.2 mg/dL — ABNORMAL LOW (ref 8.9–10.3)
Chloride: 108 mmol/L (ref 98–111)
Creatinine, Ser: 2.02 mg/dL — ABNORMAL HIGH (ref 0.61–1.24)
GFR calc Af Amer: 35 mL/min — ABNORMAL LOW (ref >60)
GFR calc non Af Amer: 30 mL/min — ABNORMAL LOW (ref >60)
Glucose, Bld: 112 mg/dL — ABNORMAL HIGH (ref 70–99)
Potassium: 5.4 mmol/L — ABNORMAL HIGH (ref 3.5–5.1)
Sodium: 143 mmol/L (ref 135–145)
Total Bilirubin: 0.8 mg/dL (ref 0.3–1.2)
Total Protein: 4.6 g/dL — ABNORMAL LOW (ref 6.5–8.1)

## 2018-05-30 LAB — BASIC METABOLIC PANEL WITH GFR
Anion gap: 10 (ref 5–15)
BUN: 80 mg/dL — ABNORMAL HIGH (ref 8–23)
CO2: 21 mmol/L — ABNORMAL LOW (ref 22–32)
Calcium: 8.4 mg/dL — ABNORMAL LOW (ref 8.9–10.3)
Chloride: 112 mmol/L — ABNORMAL HIGH (ref 98–111)
Creatinine, Ser: 1.85 mg/dL — ABNORMAL HIGH (ref 0.61–1.24)
GFR calc Af Amer: 39 mL/min — ABNORMAL LOW
GFR calc non Af Amer: 34 mL/min — ABNORMAL LOW
Glucose, Bld: 109 mg/dL — ABNORMAL HIGH (ref 70–99)
Potassium: 5.2 mmol/L — ABNORMAL HIGH (ref 3.5–5.1)
Sodium: 143 mmol/L (ref 135–145)

## 2018-05-30 LAB — STREP PNEUMONIAE URINARY ANTIGEN: Strep Pneumo Urinary Antigen: NEGATIVE

## 2018-05-30 SURGERY — CYSTOSCOPY, WITH RETROGRADE PYELOGRAM AND URETERAL STENT INSERTION
Anesthesia: General | Site: Ureter | Laterality: Right

## 2018-05-30 MED ORDER — OXYCODONE HCL 5 MG PO TABS: 5.0000 mg | ORAL_TABLET | Freq: Once | ORAL | Status: DC | PRN

## 2018-05-30 MED ORDER — SUCCINYLCHOLINE CHLORIDE 200 MG/10ML IV SOSY
PREFILLED_SYRINGE | INTRAVENOUS | Status: AC
  Filled 2018-05-30: qty 10

## 2018-05-30 MED ORDER — SODIUM CHLORIDE 0.9 % IV SOLN
500.0000 mg | INTRAVENOUS | Status: DC
  Administered 2018-05-31: 500 mg via INTRAVENOUS
  Filled 2018-05-30 (×3): qty 500

## 2018-05-30 MED ORDER — LIDOCAINE 2% (20 MG/ML) 5 ML SYRINGE
INTRAMUSCULAR | Status: DC | PRN
  Administered 2018-05-30: 60 mg via INTRAVENOUS

## 2018-05-30 MED ORDER — ACETAMINOPHEN 160 MG/5ML PO SOLN: 1000.0000 mg | Freq: Once | ORAL | Status: DC | PRN

## 2018-05-30 MED ORDER — ONDANSETRON HCL 4 MG/2ML IJ SOLN
INTRAMUSCULAR | Status: DC | PRN
  Administered 2018-05-30: 4 mg via INTRAVENOUS

## 2018-05-30 MED ORDER — ONDANSETRON HCL 4 MG/2ML IJ SOLN
INTRAMUSCULAR | Status: AC
  Filled 2018-05-30: qty 2

## 2018-05-30 MED ORDER — SODIUM CHLORIDE 0.9 % IV SOLN
1.0000 g | INTRAVENOUS | Status: DC
  Administered 2018-05-30: 1 g via INTRAVENOUS
  Filled 2018-05-30: qty 10

## 2018-05-30 MED ORDER — ACETAMINOPHEN 10 MG/ML IV SOLN: 1000.0000 mg | Freq: Once | INTRAVENOUS | Status: DC | PRN

## 2018-05-30 MED ORDER — SODIUM CHLORIDE 0.9 % IV SOLN
INTRAVENOUS | Status: DC | PRN
  Administered 2018-05-30: 75 ug/min via INTRAVENOUS
  Administered 2018-05-30: 50 ug/min via INTRAVENOUS
  Administered 2018-05-30: 35 ug/min via INTRAVENOUS

## 2018-05-30 MED ORDER — LIDOCAINE HCL URETHRAL/MUCOSAL 2 % EX GEL
CUTANEOUS | Status: AC
  Filled 2018-05-30: qty 20

## 2018-05-30 MED ORDER — FENTANYL CITRATE (PF) 100 MCG/2ML IJ SOLN: 25.0000 ug | INTRAMUSCULAR | Status: DC | PRN

## 2018-05-30 MED ORDER — SODIUM CHLORIDE 0.9 % IV SOLN
INTRAVENOUS | Status: DC
  Administered 2018-05-30 – 2018-05-31 (×2): via INTRAVENOUS

## 2018-05-30 MED ORDER — DEXAMETHASONE SODIUM PHOSPHATE 10 MG/ML IJ SOLN
INTRAMUSCULAR | Status: AC
  Filled 2018-05-30: qty 1

## 2018-05-30 MED ORDER — PHENYLEPHRINE 40 MCG/ML (10ML) SYRINGE FOR IV PUSH (FOR BLOOD PRESSURE SUPPORT)
PREFILLED_SYRINGE | INTRAVENOUS | Status: DC | PRN
  Administered 2018-05-30 (×3): 80 ug via INTRAVENOUS

## 2018-05-30 MED ORDER — IOHEXOL 300 MG/ML  SOLN
INTRAMUSCULAR | Status: DC | PRN
  Administered 2018-05-30: 7 mL via INTRAVENOUS

## 2018-05-30 MED ORDER — FENTANYL CITRATE (PF) 250 MCG/5ML IJ SOLN
INTRAMUSCULAR | Status: AC
  Filled 2018-05-30: qty 5

## 2018-05-30 MED ORDER — SUCCINYLCHOLINE CHLORIDE 200 MG/10ML IV SOSY
PREFILLED_SYRINGE | INTRAVENOUS | Status: DC | PRN
  Administered 2018-05-30: 80 mg via INTRAVENOUS

## 2018-05-30 MED ORDER — FENTANYL CITRATE (PF) 100 MCG/2ML IJ SOLN
INTRAMUSCULAR | Status: DC | PRN
  Administered 2018-05-30 (×3): 50 ug via INTRAVENOUS

## 2018-05-30 MED ORDER — PROPOFOL 10 MG/ML IV BOLUS
INTRAVENOUS | Status: AC
  Filled 2018-05-30: qty 20

## 2018-05-30 MED ORDER — OXYCODONE HCL 5 MG/5ML PO SOLN: 5.0000 mg | Freq: Once | ORAL | Status: DC | PRN

## 2018-05-30 MED ORDER — ACETAMINOPHEN 500 MG PO TABS: 1000.0000 mg | ORAL_TABLET | Freq: Once | ORAL | Status: DC | PRN

## 2018-05-30 MED ORDER — 0.9 % SODIUM CHLORIDE (POUR BTL) OPTIME
TOPICAL | Status: DC | PRN
  Administered 2018-05-30: 1000 mL

## 2018-05-30 MED ORDER — CEFAZOLIN SODIUM 1 G IJ SOLR
INTRAMUSCULAR | Status: AC
  Filled 2018-05-30: qty 20

## 2018-05-30 MED ORDER — PROPOFOL 10 MG/ML IV BOLUS
INTRAVENOUS | Status: DC | PRN
  Administered 2018-05-30: 100 mg via INTRAVENOUS

## 2018-05-30 MED ORDER — LIDOCAINE 2% (20 MG/ML) 5 ML SYRINGE
INTRAMUSCULAR | Status: AC
  Filled 2018-05-30: qty 5

## 2018-05-30 MED ORDER — LACTATED RINGERS IV SOLN
INTRAVENOUS | Status: DC
  Administered 2018-05-30: 12:00:00 via INTRAVENOUS

## 2018-05-30 MED ORDER — PHENYLEPHRINE 40 MCG/ML (10ML) SYRINGE FOR IV PUSH (FOR BLOOD PRESSURE SUPPORT)
PREFILLED_SYRINGE | INTRAVENOUS | Status: AC
  Filled 2018-05-30: qty 10

## 2018-05-30 MED ORDER — DEXAMETHASONE SODIUM PHOSPHATE 10 MG/ML IJ SOLN
INTRAMUSCULAR | Status: DC | PRN
  Administered 2018-05-30: 5 mg via INTRAVENOUS

## 2018-05-30 SURGICAL SUPPLY — 22 items
BAG DRAIN URO-CYSTO SKYTR STRL (DRAIN) ×2 IMPLANT
BASKET ZERO TIP NITINOL 2.4FR (BASKET) IMPLANT
CATH URET 5FR 28IN CONE TIP (BALLOONS)
CATH URET 5FR 28IN OPEN ENDED (CATHETERS) ×2 IMPLANT
CATH URET 5FR 70CM CONE TIP (BALLOONS) IMPLANT
COVER WAND RF STERILE (DRAPES) ×2 IMPLANT
ELECT REM PT RETURN 9FT ADLT (ELECTROSURGICAL)
ELECTRODE REM PT RTRN 9FT ADLT (ELECTROSURGICAL) IMPLANT
GLOVE SURG SS PI 8.0 STRL IVOR (GLOVE) ×2 IMPLANT
GOWN STRL REUS W/TWL XL LVL3 (GOWN DISPOSABLE) ×2 IMPLANT
GUIDEWIRE ANG ZIPWIRE 038X150 (WIRE) ×2 IMPLANT
GUIDEWIRE STR DUAL SENSOR (WIRE) ×2 IMPLANT
INFUSOR MANOMETER BAG 3000ML (MISCELLANEOUS) ×2 IMPLANT
IV NS IRRIG 3000ML ARTHROMATIC (IV SOLUTION) ×2 IMPLANT
KIT TURNOVER KIT B (KITS) ×2 IMPLANT
MANIFOLD NEPTUNE II (INSTRUMENTS) ×4 IMPLANT
NS IRRIG 1000ML POUR BTL (IV SOLUTION) IMPLANT
PACK CYSTO (CUSTOM PROCEDURE TRAY) ×2 IMPLANT
SET IRRIG Y TYPE TUR BLADDER L (SET/KITS/TRAYS/PACK) IMPLANT
SOL PREP POV-IOD 4OZ 10% (MISCELLANEOUS) ×2 IMPLANT
STENT URET 6FRX26 CONTOUR (STENTS) ×2 IMPLANT
TUBE CONNECTING 12X1/4 (SUCTIONS) IMPLANT

## 2018-05-30 NOTE — Consult Note (Signed)
Subjective: CC: Nausea  Hx:  Chris Braun is an 83 yo WM who I was asked to see in consultation by Dr. Starla Link for a right ureteral stone.   I was initially contacted last week after the patient had been transferred from [facility] to r/o Covid 19 and recommended conservative management at that time.  He has since tested negative for the virus.  I was called this morning because the patient was having nausea and a KUB showed persistent presence of the stone which on my review remains in the proximal ureter adjacent to L4.  Chris Braun has a history of stones with multiple prior lithotripsies.  He is demented and unable to provide a history.   He is afebrile and doesn't appear uncomfortable.  His baseline Cr was 1.0 in 8/19 but has been 1.7 to 2 during this admission. ROS:  Review of Systems  Unable to perform ROS: Dementia    Allergies  Allergen Reactions  . Other Nausea And Vomiting and Other (See Comments)    pneumonia vaccine- chills, vomiting, fever, lost use of legs and body function. Had a fall post inj. (05/25/2013)  . Ibuprofen     Heart doctor advised he cannot take this  . Oxycodone     Mental status changes    Past Medical History:  Diagnosis Date  . Asthma   . Cataracts, bilateral   . Compression fracture    BAck  . COPD (chronic obstructive pulmonary disease) ([place])   . Depression   . Diverticulosis    per colonscopy  . Gait abnormality 01/19/2018  . GERD (gastroesophageal reflux disease)   . High cholesterol   . Hypertension   . Kidney stones   . Macular degeneration   . Memory disorder 01/19/2018  . Myocardial infarction ([place]) 2002  . 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 Osteoporosis   . Stroke [facility])     Past Surgical History:  Procedure Laterality Date  . CARDIAC CATHETERIZATION  2002,   stents   . CATARACT EXTRACTION Left 2016  . Colonscopy  2012  . HIP FRACTURE SURGERY  2008   right  . HYDROCELE EXCISION  10/12/2014  . LITHOTRIPSY      2007, 2009, 2010, 2011, 2012, 2013  . LUMBAR LAMINECTOMY/DECOMPRESSION MICRODISCECTOMY  03/15/2012   Procedure: LUMBAR LAMINECTOMY/DECOMPRESSION MICRODISCECTOMY 1 LEVEL;  Surgeon: Elaina Hoops, MD;  Location: [place] NEURO ORS;  Service: Neurosurgery;  Laterality: Left;  Left lumbar three-four decompressive lumbar laminectomy, discectomy  . MAXIMUM ACCESS (MAS)POSTERIOR LUMBAR INTERBODY FUSION (PLIF) 2 LEVEL N/A 12/08/2012   Procedure: FOR MAXIMUM ACCESS (MAS) POSTERIOR LUMBAR INTERBODY FUSION (PLIF) 2 LEVEL;  Surgeon: Elaina Hoops, MD;  Location: [place] NEURO ORS;  Service: Neurosurgery;  Laterality: N/A;  FOR MAXIMUM ACCESS (MAS) POSTERIOR LUMBAR INTERBODY FUSION (PLIF) 2 LEVEL  . ROTATOR CUFF REPAIR  1999   bil   . Ko [place]  . VT study     with Ablation    Social History   Socioeconomic History  . Marital status: Married    Spouse name: Chris Braun  . Number of children: 4  . Years of education: 85  . Highest education level: Not on file  Occupational History  . Occupation: Retired  Scientific laboratory technician  . Financial resource strain: Not on file  . Food insecurity:    Worry: Not on file    Inability: Not on file  . Transportation needs:    Medical: Not  on file    Non-medical: Not on file  Tobacco Use  . Smoking status: Former Smoker    Packs/day: 1.00    Years: 45.00    Pack years: 45.00    Types: Cigarettes    Last attempt to quit: 02/02/2001    Years since quitting: 17.3  . Smokeless tobacco: Current User    Types: Chew  Substance and Sexual Activity  . Alcohol use: No  . Drug use: No  . Sexual activity: Not on file  Lifestyle  . Physical activity:    Days per week: Not on file    Minutes per session: Not on file  . Stress: Not on file  Relationships  . Social connections:    Talks on phone: Not on file    Gets together: Not on file    Attends religious service: Not on file    Active member of club or organization: Not on file    Attends meetings  of clubs or organizations: Not on file    Relationship status: Not on file  . Intimate partner violence:    Fear of current or ex partner: Not on file    Emotionally abused: Not on file    Physically abused: Not on file    Forced sexual activity: Not on file  Other Topics Concern  . Not on file  Social History Narrative   Lives with wife   Caffeine use: coffee, tea    Family History  Problem Relation Age of Onset  . Hemolytic uremic syndrome Mother   . Stroke Father     Anti-infectives: Anti-infectives (From admission, onward)   Start     Dose/Rate Route Frequency Ordered Stop   05/31/18 0840  azithromycin (ZITHROMAX) 500 mg in sodium chloride 0.9 % 250 mL IVPB     500 mg 250 mL/hr over 60 Minutes Intravenous Every 24 hours 05/30/18 0855     05/30/18 1000  azithromycin (ZITHROMAX) tablet 500 mg  Status:  Discontinued     500 mg Oral Daily 05/29/18 1226 05/30/18 0855   05/30/18 1000  cefTRIAXone (ROCEPHIN) 1 g in sodium chloride 0.9 % 100 mL IVPB     1 g 200 mL/hr over 30 Minutes Intravenous Every 24 hours 05/30/18 0855     05/29/18 1400  cephALEXin (KEFLEX) capsule 500 mg  Status:  Discontinued     500 mg Oral Every 8 hours 05/29/18 1226 05/30/18 0855   05/28/18 0600  azithromycin (ZITHROMAX) 500 mg in sodium chloride 0.9 % 250 mL IVPB  Status:  Discontinued     500 mg 250 mL/hr over 60 Minutes Intravenous Every 24 hours 05/27/18 1317 05/29/18 1225   05/28/18 0500  cefTRIAXone (ROCEPHIN) 1 g in sodium chloride 0.9 % 100 mL IVPB  Status:  Discontinued     1 g 200 mL/hr over 30 Minutes Intravenous Every 24 hours 05/27/18 1317 05/29/18 1225   05/27/18 1230  cefTRIAXone (ROCEPHIN) 1 g in sodium chloride 0.9 % 100 mL IVPB  Status:  Discontinued     1 g 200 mL/hr over 30 Minutes Intravenous Every 24 hours 05/27/18 1124 05/27/18 1317   05/27/18 1230  azithromycin (ZITHROMAX) 500 mg in sodium chloride 0.9 % 250 mL IVPB  Status:  Discontinued     500 mg 250 mL/hr over 60 Minutes  Intravenous Every 24 hours 05/27/18 1124 05/27/18 1317      Current Facility-Administered Medications  Medication Dose Route Frequency Provider Last Rate Last Dose  . 0.9 %  sodium chloride infusion  250 mL Intravenous PRN Dessa Phi, DO      . 0.9 %  sodium chloride infusion   Intravenous Continuous Alekh, Kshitiz, MD      . acetaminophen (TYLENOL) tablet 650 mg  650 mg Oral Q6H PRN Dessa Phi, DO   650 mg at 05/28/18 6440  . aspirin EC tablet 81 mg  81 mg Oral Daily Dessa Phi, DO   81 mg at 05/30/18 0839  . atorvastatin (LIPITOR) tablet 40 mg  40 mg Oral Daily Dessa Phi, DO   40 mg at 05/30/18 0840  . [START ON 05/31/2018] azithromycin (ZITHROMAX) 500 mg in sodium chloride 0.9 % 250 mL IVPB  500 mg Intravenous Q24H Alekh, Kshitiz, MD      . benzonatate (TESSALON) capsule 100 mg  100 mg Oral BID Lavina Hamman, MD   100 mg at 05/30/18 0840  . cefTRIAXone (ROCEPHIN) 1 g in sodium chloride 0.9 % 100 mL IVPB  1 g Intravenous Q24H Alekh, Kshitiz, MD      . donepezil (ARICEPT) tablet 5 mg  5 mg Oral QHS Dessa Phi, DO   5 mg at 05/29/18 2147  . heparin injection 5,000 Units  5,000 Units Subcutaneous Q8H Dessa Phi, DO   5,000 Units at 05/30/18 0529  . HYDROcodone-acetaminophen (NORCO/VICODIN) 5-325 MG per tablet 1 tablet  1 tablet Oral Q6H PRN Lavina Hamman, MD   1 tablet at 05/30/18 0529  . metoprolol succinate (TOPROL-XL) 24 hr tablet 12.5 mg  12.5 mg Oral QHS Lavina Hamman, MD   12.5 mg at 05/28/18 2052  . mometasone-formoterol (DULERA) 200-5 MCG/ACT inhaler 2 puff  2 puff Inhalation BID Dessa Phi, DO   2 puff at 05/29/18 0813  . montelukast (SINGULAIR) tablet 10 mg  10 mg Oral QHS Lavina Hamman, MD   10 mg at 05/29/18 2145  . ondansetron (ZOFRAN) tablet 4 mg  4 mg Oral Q6H PRN Dessa Phi, DO       Or  . ondansetron [facility]) injection 4 mg  4 mg Intravenous Q6H PRN Dessa Phi, DO   4 mg at 05/29/18 2210  . polyethylene glycol (MIRALAX / GLYCOLAX)  packet 17 g  17 g Oral Daily Lavina Hamman, MD   17 g at 05/30/18 0839  . rOPINIRole (REQUIP) tablet 0.5 mg  0.5 mg Oral TID Dessa Phi, DO   0.5 mg at 05/30/18 0839  . senna-docusate (Senokot-S) tablet 1 tablet  1 tablet Oral BID Lavina Hamman, MD   1 tablet at 05/30/18 0840  . sodium chloride flush (NS) 0.9 % injection 3 mL  3 mL Intravenous Q12H Dessa Phi, DO   3 mL at 05/30/18 0840  . sodium chloride flush (NS) 0.9 % injection 3 mL  3 mL Intravenous PRN Dessa Phi, DO      . tamsulosin (FLOMAX) capsule 0.4 mg  0.4 mg Oral Daily Dessa Phi, DO   0.4 mg at 05/30/18 0840  . traMADol (ULTRAM) tablet 50 mg  50 mg Oral Q6H PRN Dessa Phi, DO   50 mg at 05/29/18 0456     Objective: Vital signs in last 24 hours: Temp:  [97.5 F (36.4 C)-98.8 F (37.1 C)] 98.8 F (37.1 C) (04/12 0518) Pulse Rate:  [87-114] 112 (04/12 0518) BP: (87-120)/(47-74) 120/74 (04/12 0518) SpO2:  [93 %-97 %] 96 % (04/12 0518)  Intake/Output from previous day: 04/11 0701 - 04/12 0700 In: 740 [P.O.:240; IV Piggyback:500] Out: 750 [Urine:750] Intake/Output  this shift: No intake/output data recorded.   Physical Exam Vitals signs reviewed.  Constitutional:      Appearance: Normal appearance.  HENT:     Head: Normocephalic and atraumatic.  Neck:     Musculoskeletal: Normal range of motion and neck supple.  Cardiovascular:     Rate and Rhythm: Normal rate and regular rhythm.     Heart sounds: Normal heart sounds.  Pulmonary:     Effort: Pulmonary effort is normal. No respiratory distress.     Breath sounds: Normal breath sounds.  Abdominal:     General: Abdomen is flat.     Palpations: Abdomen is soft.     Tenderness: There is abdominal tenderness (mild left abdominal tenderness but none on right. ).  Musculoskeletal: Normal range of motion.        General: No swelling or tenderness.  Lymphadenopathy:     Cervical: No cervical adenopathy.  Skin:    General: Skin is warm and dry.   Neurological:     General: No focal deficit present.     Mental Status: He is alert.     Comments: Not able to answer questions clearly.     Lab Results:  Recent Labs    05/29/18 0313 05/29/18 2238 05/30/18 0222  WBC 10.1  --  8.7  HGB 11.6* 9.3* 8.8*  HCT 34.3* 29.0* 27.3*  PLT 157  --  182   BMET Recent Labs    05/30/18 0222 05/30/18 0804  NA 143 143  K 5.4* 5.2*  CL 108 112*  CO2 22 21*  GLUCOSE 112* 109*  BUN 71* 80*  CREATININE 2.02* 1.85*  CALCIUM 8.2* 8.4*   PT/INR No results for input(s): LABPROT, INR in the last 72 hours. ABG No results for input(s): PHART, HCO3 in the last 72 hours.  Invalid input(s): PCO2, PO2  Studies/Results: Dg Abd Portable 1v  Result Date: 05/29/2018 CLINICAL DATA:  Emesis. EXAM: PORTABLE ABDOMEN - 1 VIEW COMPARISON:  CT scan of May 27, 2018.  Radiograph of May 25, 2018. FINDINGS: The bowel gas pattern is normal. Probable distal right ureteral calculus is noted to the right of L4 vertebral body. IMPRESSION: No definite evidence of bowel obstruction or ileus. Probable distal right ureteral calculus as described above. Electronically Signed   By: Marijo Conception, M.D.   On: 05/29/2018 22:48   I have reviewed his CT and KUB films and Labs and discussed his case with Dr. Starla Link.   Assessment: RIght proximal stone with obstruction with AKI.   He needs placement of a right ureteral stent for relief of obstruction and will need either ESWL or Ureteroscopy at a later date.  I have discussed the risks of the  Procedure with his wife, Chris Braun, including bleeding, infection, ureteral injury, need for secondary procedure and anesthetic complications.    CC: Dr. Aline August.   Irine Seal 05/30/2018 [phone]

## 2018-05-30 NOTE — Anesthesia Procedure Notes (Signed)
Procedure Name: Intubation Date/Time: 05/30/2018 12:53 PM Performed by: Trinna Post., CRNA Pre-anesthesia Checklist: Patient identified, Emergency Drugs available, Suction available, Patient being monitored and Timeout performed Patient Re-evaluated:Patient Re-evaluated prior to induction Oxygen Delivery Method: Circle system utilized Preoxygenation: Pre-oxygenation with 100% oxygen Induction Type: IV induction, Rapid sequence and Cricoid Pressure applied Laryngoscope Size: Mac and 4 Grade View: Grade I Tube type: Oral Tube size: 7.0 mm Number of attempts: 1 Airway Equipment and Method: Stylet Placement Confirmation: ETT inserted through vocal cords under direct vision,  positive ETCO2 and breath sounds checked- equal and bilateral Secured at: 23 cm Tube secured with: Tape Dental Injury: Teeth and Oropharynx as per pre-operative assessment

## 2018-05-30 NOTE — Anesthesia Postprocedure Evaluation (Signed)
Anesthesia Post Note  Patient: Chris Braun  Procedure(s) Performed: CYSTOSCOPY WITH RETROGRADE PYELOGRAM/URETERAL STENT PLACEMENT (Right Ureter)     Patient location during evaluation: PACU Anesthesia Type: General Level of consciousness: awake and patient cooperative Pain management: pain level controlled Vital Signs Assessment: post-procedure vital signs reviewed and stable Respiratory status: spontaneous breathing, nonlabored ventilation, respiratory function stable and patient connected to nasal cannula oxygen Cardiovascular status: stable Postop Assessment: no apparent nausea or vomiting Anesthetic complications: no    Last Vitals:  Vitals:   05/30/18 1345 05/30/18 1400  BP: 93/70 (!) 107/54  Pulse: 89 94  Resp: (!) 21 20  Temp:  (!) 36.3 C  SpO2: 100% 100%    Last Pain:  Vitals:   05/30/18 1400  TempSrc:   PainSc: 0-No pain                 Kiril Hippe

## 2018-05-30 NOTE — Discharge Instructions (Signed)
Ureteral Stent Implantation, Care After °Refer to this sheet in the next few weeks. These instructions provide you with information about caring for yourself after your procedure. Your health care provider may also give you more specific instructions. Your treatment has been planned according to current medical practices, but problems sometimes occur. Call your health care provider if you have any problems or questions after your procedure. °What can I expect after the procedure? °After the procedure, it is common to have: °· Nausea. °· Mild pain when you urinate. You may feel this pain in your lower back or lower abdomen. Pain should stop within a few minutes after you urinate. This may last for up to 1 week. °· A small amount of blood in your urine for several days. °Follow these instructions at home: ° °Medicines °· Take over-the-counter and prescription medicines only as told by your health care provider. °· If you were prescribed an antibiotic medicine, take it as told by your health care provider. Do not stop taking the antibiotic even if you start to feel better. °· Do not drive for 24 hours if you received a sedative. °· Do not drive or operate heavy machinery while taking prescription pain medicines. °Activity °· Return to your normal activities as told by your health care provider. Ask your health care provider what activities are safe for you. °· Do not lift anything that is heavier than 10 lb (4.5 kg). Follow this limit for 1 week after your procedure, or for as long as told by your health care provider. °General instructions °· Watch for any blood in your urine. Call your health care provider if the amount of blood in your urine increases. °· If you have a catheter: °? Follow instructions from your health care provider about taking care of your catheter and collection bag. °? Do not take baths, swim, or use a hot tub until your health care provider approves. °· Drink enough fluid to keep your urine  clear or pale yellow. °· Keep all follow-up visits as told by your health care provider. This is important. °Contact a health care provider if: °· You have pain that gets worse or does not get better with medicine, especially pain when you urinate. °· You have difficulty urinating. °· You feel nauseous or you vomit repeatedly during a period of more than 2 days after the procedure. °Get help right away if: °· Your urine is dark red or has blood clots in it. °· You are leaking urine (have incontinence). °· The end of the stent comes out of your urethra. °· You cannot urinate. °· You have sudden, sharp, or severe pain in your abdomen or lower back. °· You have a fever. °This information is not intended to replace advice given to you by your health care provider. Make sure you discuss any questions you have with your health care provider. °Document Released: 10/06/2012 Document Revised: 07/12/2015 Document Reviewed: 08/18/2014 °Elsevier Interactive Patient Education © 2019 [employer] ° °

## 2018-05-30 NOTE — Progress Notes (Signed)
Consent obtained from patient's wife, Pricilla Holm for cystoscopy with right ureteral stent placement. Glenford Bayley, RN verified consent obtained.  Consent signed in chart.  Hiram Comber, RN 05/30/2018 10:32 AM

## 2018-05-30 NOTE — Anesthesia Preprocedure Evaluation (Signed)
Anesthesia Evaluation  Patient identified by MRN, date of birth, ID band Patient awake    Reviewed: Allergy & Precautions, H&P , NPO status , Patient's Chart, lab work & pertinent test results, reviewed documented beta blocker date and time   History of Anesthesia Complications Negative for: history of anesthetic complications  Airway Mallampati: II  TM Distance: >3 FB     Dental  (+) Poor Dentition, Chipped, Missing, Loose, Dental Advisory Given   Pulmonary asthma , COPD,  COPD inhaler, former smoker,    breath sounds clear to auscultation       Cardiovascular hypertension, Pt. on medications and Pt. on home beta blockers (-) angina+ CAD, + Past MI and + Cardiac Stents   Rhythm:Regular Rate:Normal     Neuro/Psych PSYCHIATRIC DISORDERS Depression  Neuromuscular disease CVA    GI/Hepatic Neg liver ROS, GERD  ,  Endo/Other  negative endocrine ROS  Renal/GU ARFRenal disease     Musculoskeletal negative musculoskeletal ROS (+)   Abdominal (+)  Abdomen: soft. Bowel sounds: normal.  Peds  Hematology  (+) anemia ,   Anesthesia Other Findings MI in 2003 cardiac stent placed in [place], no chest pain or complications since 03.  EF 55%.  Pt states he is followed frequently by cardiology.  ECHO and notes in chart. Waldron Session, CRNA  Reproductive/Obstetrics                             Anesthesia Physical Anesthesia Plan  ASA: III and emergent  Anesthesia Plan: General   Post-op Pain Management:    Induction: Intravenous and Rapid sequence  PONV Risk Score and Plan: 2 and Ondansetron and Dexamethasone  Airway Management Planned: Oral ETT  Additional Equipment: None  Intra-op Plan:   Post-operative Plan: Extubation in OR  Informed Consent: I have reviewed the patients History and Physical, chart, labs and discussed the procedure including the risks, benefits and alternatives for the  proposed anesthesia with the patient or authorized representative who has indicated his/her understanding and acceptance.     Dental advisory given  Plan Discussed with: CRNA and Surgeon  Anesthesia Plan Comments:         Anesthesia Quick Evaluation

## 2018-05-30 NOTE — Op Note (Signed)
Procedure: Cystoscopy with right retrograde pyelogram and interpretation with insertion of right double-J stent.  Preop diagnosis: Right proximal ureteral stone with obstruction.  Postop diagnosis: Same.  Surgeon: Dr. Irine Seal.  Anesthesia: General.  Specimen: None.  Drain: 6 [place] by 26 cm contour double-J stent on the right without tether.  EBL: Minimal.  Complications: None.  Indications: The patient is an 83 year old male who has a 82mm right proximal stone with obstruction and AK I.  His stone is not progressed over the past week and he continues to remain symptomatic.  It was felt this that he would benefit from ureteral stent placement with defer definitive management since he is recovering from pneumonia.  Procedure: He has received Rocephin earlier today.  He was taken operating room where general anesthetic was induced.  He was placed in lithotomy position and fitted with PAS hose.  His perineum and genitalia were prepped with Betadine solution he was draped in usual sterile fashion.  Cystoscopy was performed with a 17-1/2 [place] scope with a 30 degree lens.  Examination revealed a normal urethra.  The external sphincter was intact.  The prostatic urethra was approximately 2 cm in length with nonobstructive lateral lobe hyperplasia.  Examination of bladder revealed mild trabeculation.  The mucosa was unremarkable.  Ureteral orifice ease were unremarkable.  The right ureteral orifice was cannulated with a 5 French opening catheter and contrast was instilled.  Right retrograde pyelogram demonstrated a normal caliber ureter up to the level of the stone in the proximal ureter where there was a kink just below the stone and proximal dilation.  A sensor guidewire was passed through the open-ended catheter to the level of the stone but due to the tortuosity of the ureter I was unable to get it beyond the stone.  An angled tipped Glidewire was then passed through the open-ended  catheter and successfully negotiated into the kidney.  The open-ended catheter was advanced to the kidney and the Glidewire was exchanged for a sensor wire.  The cystoscope caliber was changed to 21 [place] and a 6 [place] by 26 cm contour double-J stent was advanced over the wire to the kidney under fluoroscopic guidance.  The wire was removed leaving a good coil in the kidney and a good coil in the bladder.  The bladder was drained and the cystoscope was removed.  He was taken down from lithotomy position, his anesthetic was reversed and he was moved recovery in stable condition.  There were no complications.

## 2018-05-30 NOTE — Progress Notes (Signed)
Patient back to room from the OR. IVF restarted. Patient alert to self only. Oriented patient back to room and surroundings. Bed alarm on and audible. Denies any pain. Will continue to monitor.  Hiram Comber, RN 05/30/2018 15:19 pm

## 2018-05-30 NOTE — Progress Notes (Signed)
Patient ID: Chris Braun, male   DOB: [birth year], 83 y.o.   MRN: [id number]  PROGRESS NOTE    Chris Braun  YQM:[id number] DOB: 1935-09-09 DOA: 05/27/2018 PCP: [name]., MD   Brief Narrative:  83 year old male with history of CAD, hyperlipidemia, dementia, COPD, chronic diastolic CHF was transferred from [facility] ED on 05/27/2018 with fever and cough with some flank pain.  Influenza was negative.  CT of the chest abdomen pelvis showed probable right upper lobe pneumonia, moderate to advanced emphysema, obstructing 5 mm stone right mid ureter with moderate hydroureteronephrosis.  Urology was consulted, consultation was deferred for COVID-19 rule out.  He was started on intravenous antibiotics which were subsequently switched to oral antibiotics.  COVID-19 testing has been negative.  Assessment & Plan:   Principal Problem:   Acute hypoxemic respiratory failure (HCC) Active Problems:   CAD (coronary artery disease)   Dyslipidemia   AKI (acute kidney injury) ([place])   Right kidney stone   Right upper lobe pneumonia (HCC)  Right upper lobar community-acquired pneumonia with hypoxia -Initially required oxygen via nasal cannula 2 L/min.  Currently on room air. -Initially started on Rocephin and Zithromax which was changed to oral antibiotics.  Patient was vomiting overnight.  Will change it back to IV Rocephin and Zithromax. -COVID-19 testing negative.  COPD -Currently stable.  No wheezing.  Continue Dulera and montelukast and as needed duo nebs.  CT chest at [facility] showed advanced emphysema.  Acute kidney injury -Baseline creatinine is 1.1.  Creatinine was 1.8 in [place] -Creatinine is 2.02 today.  Patient was treated with IV fluids which have been stopped.  Because of worsening renal function, will restart IV fluids  Right ureteric stone with moderate hydroureteronephrosis -CT abdomen at [facility] had shown obstructing 5 mm right mid ureter stone with moderate hydronephrosis.  Patient  had vomiting overnight with hypotension and x-ray abdomen had shown probable distal right ureteric stone.  Spoke to Dr. Wrenn/urology on phone who will evaluate the patient today.  We will keep him n.p.o. for now.  CAD  -Continue aspirin.  Hold Plavix in case patient needs urological intervention  Hyperlipidemia--continue Lipitor  Dementia -Continue Aricept.  Palliative care evaluation for goals of care  Hypertension--Toprol held since admission due to bradycardia.  Currently blood pressure on the lower side and tachycardic.  Infrarenal aortic aneurysm -Stable infrarenal aortic aneurysm max dimension 4.3 cm, recommend follow-up ultrasound in 1 year   DVT prophylaxis: Subcutaneous heparin Code Status: Full Family Communication: None at bedside Disposition Plan: Depends on clinical outcome  Consultants: Urology  Procedures: None Antimicrobials:  Anti-infectives (From admission, onward)   Start     Dose/Rate Route Frequency Ordered Stop   05/30/18 1000  azithromycin (ZITHROMAX) tablet 500 mg     500 mg Oral Daily 05/29/18 1226     05/29/18 1400  cephALEXin (KEFLEX) capsule 500 mg     500 mg Oral Every 8 hours 05/29/18 1226     05/28/18 0600  azithromycin (ZITHROMAX) 500 mg in sodium chloride 0.9 % 250 mL IVPB  Status:  Discontinued     500 mg 250 mL/hr over 60 Minutes Intravenous Every 24 hours 05/27/18 1317 05/29/18 1225   05/28/18 0500  cefTRIAXone (ROCEPHIN) 1 g in sodium chloride 0.9 % 100 mL IVPB  Status:  Discontinued     1 g 200 mL/hr over 30 Minutes Intravenous Every 24 hours 05/27/18 1317 05/29/18 1225   05/27/18 1230  cefTRIAXone (ROCEPHIN) 1 g in sodium chloride 0.9 %  100 mL IVPB  Status:  Discontinued     1 g 200 mL/hr over 30 Minutes Intravenous Every 24 hours 05/27/18 1124 05/27/18 1317   05/27/18 1230  azithromycin (ZITHROMAX) 500 mg in sodium chloride 0.9 % 250 mL IVPB  Status:  Discontinued     500 mg 250 mL/hr over 60 Minutes Intravenous Every 24 hours  05/27/18 1124 05/27/18 1317       Subjective: Patient seen and examined at bedside.  He is awake but confused.  Nursing staff reports that patient had vomiting overnight with hypotension.  Objective: Vitals:   05/29/18 2150 05/29/18 2320 05/30/18 0337 05/30/18 0518  BP: (!) 87/58 (!) 95/52 (!) 103/47 120/74  Pulse: (!) 109 (!) 114 (!) 110 (!) 112  Resp:      Temp: (!) 97.5 F (36.4 C) 98.2 F (36.8 C) 98.3 F (36.8 C) 98.8 F (37.1 C)  TempSrc: Oral Oral Oral Oral  SpO2: 97% 93% 94% 96%  Weight:      Height:        Intake/Output Summary (Last 24 hours) at 05/30/2018 0842 Last data filed at 05/30/2018 0335 Gross per 24 hour  Intake 740 ml  Output 650 ml  Net 90 ml   Filed Weights   05/27/18 1008 05/29/18 0353  Weight: 73.7 kg 74.5 kg    Examination:  General exam: Appears calm and comfortable.  Awake but confused.  Looks chronically ill Respiratory system: Bilateral decreased breath sounds at bases, some scattered crackles Cardiovascular system: S1 & S2 heard, tachycardic Gastrointestinal system: Abdomen is nondistended, soft and nontender. Normal bowel sounds heard. Extremities: No cyanosis, clubbing, edema  Central nervous system: Awake but confused no focal neurological deficits. Moving extremities Skin: No rashes, lesions or ulcers Psychiatry: Could not be assessed because of mental status.    Data Reviewed: I have personally reviewed following labs and imaging studies  CBC: Recent Labs  Lab 05/27/18 1136 05/28/18 0552 05/29/18 0313 05/29/18 2238 05/30/18 0222  WBC 10.7* 11.1* 10.1  --  8.7  NEUTROABS  --  8.5* 7.8*  --  7.2  HGB 12.8* 12.2* 11.6* 9.3* 8.8*  HCT 37.3* 35.5* 34.3* 29.0* 27.3*  MCV 88.4 89.4 87.9  --  90.7  PLT 141* 135* 157  --  326   Basic Metabolic Panel: Recent Labs  Lab 05/27/18 1136 05/28/18 0552 05/29/18 0313 05/30/18 0222  NA  --  140 139 143  K  --  4.0 4.0 5.4*  CL  --  105 105 108  CO2  --  22 24 22   GLUCOSE  --   87 107* 112*  BUN  --  14 18 71*  CREATININE 1.83* 1.72* 1.71* 2.02*  CALCIUM  --  8.1* 8.2* 8.2*   GFR: Estimated Creatinine Clearance: 29.2 mL/min (A) (by C-G formula based on SCr of 2.02 mg/dL (H)). Liver Function Tests: Recent Labs  Lab 05/28/18 0552 05/29/18 0313 05/30/18 0222  AST 20 23 25   ALT 15 17 20   ALKPHOS 77 78 58  BILITOT 0.9 1.0 0.8  PROT 5.3* 5.3* 4.6*  ALBUMIN 2.4* 2.3* 2.1*   No results for input(s): LIPASE, AMYLASE in the last 168 hours. No results for input(s): AMMONIA in the last 168 hours. Coagulation Profile: No results for input(s): INR, PROTIME in the last 168 hours. Cardiac Enzymes: No results for input(s): CKTOTAL, CKMB, CKMBINDEX, TROPONINI in the last 168 hours. BNP (last 3 results) No results for input(s): PROBNP in the last 8760 hours. HbA1C: No  results for input(s): HGBA1C in the last 72 hours. CBG: No results for input(s): GLUCAP in the last 168 hours. Lipid Profile: No results for input(s): CHOL, HDL, LDLCALC, TRIG, CHOLHDL, LDLDIRECT in the last 72 hours. Thyroid Function Tests: No results for input(s): TSH, T4TOTAL, FREET4, T3FREE, THYROIDAB in the last 72 hours. Anemia Panel: Recent Labs    05/29/18 0313  FERRITIN 123   Sepsis Labs: No results for input(s): PROCALCITON, LATICACIDVEN in the last 168 hours.  No results found for this or any previous visit (from the past 240 hour(s)).       Radiology Studies: Dg Abd Portable 1v  Result Date: 05/29/2018 CLINICAL DATA:  Emesis. EXAM: PORTABLE ABDOMEN - 1 VIEW COMPARISON:  CT scan of May 27, 2018.  Radiograph of May 25, 2018. FINDINGS: The bowel gas pattern is normal. Probable distal right ureteral calculus is noted to the right of L4 vertebral body. IMPRESSION: No definite evidence of bowel obstruction or ileus. Probable distal right ureteral calculus as described above. Electronically Signed   By: Marijo Conception, M.D.   On: 05/29/2018 22:48        Scheduled Meds: .  aspirin EC  81 mg Oral Daily  . atorvastatin  40 mg Oral Daily  . azithromycin  500 mg Oral Daily  . benzonatate  100 mg Oral BID  . cephALEXin  500 mg Oral Q8H  . clopidogrel  75 mg Oral Daily  . donepezil  5 mg Oral QHS  . heparin  5,000 Units Subcutaneous Q8H  . metoprolol succinate  12.5 mg Oral QHS  . mometasone-formoterol  2 puff Inhalation BID  . montelukast  10 mg Oral QHS  . polyethylene glycol  17 g Oral Daily  . rOPINIRole  0.5 mg Oral TID  . senna-docusate  1 tablet Oral BID  . sodium chloride flush  3 mL Intravenous Q12H  . tamsulosin  0.4 mg Oral Daily   Continuous Infusions: . sodium chloride       LOS: 3 days        Aline August, MD [facility] 05/30/2018, 8:42 AM

## 2018-05-30 NOTE — Transfer of Care (Signed)
Immediate Anesthesia Transfer of Care Note  Patient: Chris Braun  Procedure(s) Performed: CYSTOSCOPY WITH RETROGRADE PYELOGRAM/URETERAL STENT PLACEMENT (Right Ureter)  Patient Location: PACU  Anesthesia Type:General  Level of Consciousness: awake and drowsy  Airway & Oxygen Therapy: Patient Spontanous Breathing and Patient connected to face mask oxygen  Post-op Assessment: Report given to RN and Post -op Vital signs reviewed and stable  Post vital signs: Reviewed and stable  Last Vitals:  Vitals Value Taken Time  BP 112/66 05/30/2018  1:33 PM  Temp    Pulse 87 05/30/2018  1:38 PM  Resp 20 05/30/2018  1:38 PM  SpO2 100 % 05/30/2018  1:38 PM  Vitals shown include unvalidated device data.  Last Pain:  Vitals:   05/30/18 0911  TempSrc:   PainSc: 0-No pain      Patients Stated Pain Goal: 4 (46/19/01 2224)  Complications: No apparent anesthesia complications

## 2018-05-30 NOTE — Progress Notes (Signed)
Ordered patient clear liquid dinner tray. Upon tray arrival, patient woken up but denied having an appetite. Encouraged patient to attempt to eat some dinner with no luck.  Hiram Comber, RN 05/30/2018 5:33 PM

## 2018-05-30 NOTE — Progress Notes (Signed)
Patient's BP was 87/58, HR was 114 and the patient started vomiting a small amount of brown emesis. This nurse paged the on call MD who ordered a 500cc bolus, x-ray and additional labs. After the bolus the patient's BP went up to 95/52. MD is aware. Monitoring closely.

## 2018-05-31 ENCOUNTER — Encounter (HOSPITAL_COMMUNITY): Payer: Self-pay | Admitting: Urology

## 2018-05-31 DIAGNOSIS — N201 Calculus of ureter: Secondary | ICD-10-CM

## 2018-05-31 LAB — CBC WITH DIFFERENTIAL/PLATELET
Abs Immature Granulocytes: 0.09 10*3/uL — ABNORMAL HIGH (ref 0.00–0.07)
Basophils Absolute: 0 10*3/uL (ref 0.0–0.1)
Basophils Relative: 0 %
Eosinophils Absolute: 0 10*3/uL (ref 0.0–0.5)
Eosinophils Relative: 0 %
HCT: 21.9 % — ABNORMAL LOW (ref 39.0–52.0)
Hemoglobin: 7.3 g/dL — ABNORMAL LOW (ref 13.0–17.0)
Immature Granulocytes: 1 %
Lymphocytes Relative: 11 %
Lymphs Abs: 0.8 10*3/uL (ref 0.7–4.0)
MCH: 29.8 pg (ref 26.0–34.0)
MCHC: 33.3 g/dL (ref 30.0–36.0)
MCV: 89.4 fL (ref 80.0–100.0)
Monocytes Absolute: 0.3 10*3/uL (ref 0.1–1.0)
Monocytes Relative: 5 %
Neutro Abs: 6 10*3/uL (ref 1.7–7.7)
Neutrophils Relative %: 83 %
Platelets: 200 10*3/uL (ref 150–400)
RBC: 2.45 MIL/uL — ABNORMAL LOW (ref 4.22–5.81)
RDW: 13.3 % (ref 11.5–15.5)
WBC: 7.2 10*3/uL (ref 4.0–10.5)
nRBC: 0 % (ref 0.0–0.2)

## 2018-05-31 LAB — COMPREHENSIVE METABOLIC PANEL
ALT: 27 U/L (ref 0–44)
AST: 47 U/L — ABNORMAL HIGH (ref 15–41)
Albumin: 2.2 g/dL — ABNORMAL LOW (ref 3.5–5.0)
Alkaline Phosphatase: 55 U/L (ref 38–126)
Anion gap: 11 (ref 5–15)
BUN: 64 mg/dL — ABNORMAL HIGH (ref 8–23)
CO2: 21 mmol/L — ABNORMAL LOW (ref 22–32)
Calcium: 8.3 mg/dL — ABNORMAL LOW (ref 8.9–10.3)
Chloride: 114 mmol/L — ABNORMAL HIGH (ref 98–111)
Creatinine, Ser: 1.55 mg/dL — ABNORMAL HIGH (ref 0.61–1.24)
GFR calc Af Amer: 48 mL/min — ABNORMAL LOW (ref >60)
GFR calc non Af Amer: 42 mL/min — ABNORMAL LOW (ref >60)
Glucose, Bld: 123 mg/dL — ABNORMAL HIGH (ref 70–99)
Potassium: 4.2 mmol/L (ref 3.5–5.1)
Sodium: 146 mmol/L — ABNORMAL HIGH (ref 135–145)
Total Bilirubin: 0.6 mg/dL (ref 0.3–1.2)
Total Protein: 4.5 g/dL — ABNORMAL LOW (ref 6.5–8.1)

## 2018-05-31 LAB — LEGIONELLA PNEUMOPHILA SEROGP 1 UR AG: L. pneumophila Serogp 1 Ur Ag: NEGATIVE

## 2018-05-31 LAB — MAGNESIUM: Magnesium: 1.8 mg/dL (ref 1.7–2.4)

## 2018-05-31 MED ORDER — CEFDINIR 300 MG PO CAPS: 300.0000 mg | ORAL_CAPSULE | Freq: Two times a day (BID) | ORAL | 0 refills | Status: AC

## 2018-05-31 MED ORDER — CEFDINIR 300 MG PO CAPS
300.0000 mg | ORAL_CAPSULE | Freq: Two times a day (BID) | ORAL | Status: DC
  Administered 2018-05-31: 300 mg via ORAL
  Filled 2018-05-31: qty 1

## 2018-05-31 MED ORDER — POLYETHYLENE GLYCOL 3350 17 G PO PACK: 17.0000 g | PACK | Freq: Every day | ORAL | Status: DC

## 2018-05-31 NOTE — Progress Notes (Signed)
Nsg Discharge Note  Admit Date:  05/27/2018 Discharge date: 05/31/2018   LEARY MCNULTY to be D/C'd Home per MD order.   Patient/caregiver able to verbalize understanding.  Discharge Medication: Allergies as of 05/31/2018      Reactions   Other Nausea And Vomiting, Other (See Comments)   pneumonia vaccine- chills, vomiting, fever, lost use of legs and body function. Had a fall post inj. (05/25/2013)   Ibuprofen    Heart doctor advised he cannot take this   Oxycodone    Mental status changes      Medication List    TAKE these medications   albuterol 108 (90 Base) MCG/ACT inhaler Commonly known as:  PROVENTIL HFA;VENTOLIN HFA Inhale 2 puffs into the lungs every 6 (six) hours as needed for wheezing.   aspirin EC 81 MG tablet Take 81 mg by mouth daily.   atorvastatin 40 MG tablet Commonly known as:  LIPITOR Take 20 mg by mouth daily.   B-12 PO Take 2,500 mcg by mouth daily.   benzonatate 200 MG capsule Commonly known as:  TESSALON Take 200 mg by mouth at bedtime.   calcitonin (salmon) 200 UNIT/ACT nasal spray Commonly known as:  MIACALCIN/FORTICAL Place 1 spray into alternate nostrils daily.   calcium-vitamin D 500-200 MG-UNIT tablet Take 1 tablet by mouth daily.   cefdinir 300 MG capsule Commonly known as:  OMNICEF Take 1 capsule (300 mg total) by mouth every 12 (twelve) hours for 2 days.   clopidogrel 75 MG tablet Commonly known as:  PLAVIX Take 75 mg by mouth daily.   donepezil 5 MG tablet Commonly known as:  ARICEPT Take 5 mg by mouth at bedtime.   guaiFENesin 600 MG 12 hr tablet Commonly known as:  MUCINEX Take 600 mg by mouth 2 (two) times daily.   HYDROcodone-acetaminophen 5-325 MG tablet Commonly known as:  NORCO/VICODIN Take 1 tablet by mouth every 6 (six) hours as needed for moderate pain.   ipratropium-albuterol 0.5-2.5 (3) MG/3ML Soln Commonly known as:  DUONEB Take 3 mLs by nebulization 4 (four) times daily.   IRON PO Take 27 mg by mouth  daily.   lactulose 10 GM/15ML solution Commonly known as:  CHRONULAC Take 10 g by mouth daily as needed for mild constipation.   loratadine 10 MG tablet Commonly known as:  CLARITIN Take 10 mg by mouth.   Lutein 40 MG Caps Take 40 mg by mouth daily.   metoprolol succinate 25 MG 24 hr tablet Commonly known as:  TOPROL-XL Take 12.5 mg by mouth at bedtime.   montelukast 10 MG tablet Commonly known as:  SINGULAIR Take 10 mg by mouth at bedtime.   MULTIVITAMIN PO Take 1 tablet by mouth daily.   nitroGLYCERIN 0.4 MG SL tablet Commonly known as:  NITROSTAT Place 1 tablet (0.4 mg total) under the tongue every 5 (five) minutes as needed for chest pain.   ondansetron 4 MG tablet Commonly known as:  ZOFRAN Take 4 mg by mouth every 8 (eight) hours as needed for nausea or vomiting.   pantoprazole 40 MG tablet Commonly known as:  PROTONIX Take 40 mg by mouth daily.   polyethylene glycol 17 g packet Commonly known as:  MIRALAX / GLYCOLAX Take 17 g by mouth daily. What changed:  when to take this   rOPINIRole 0.5 MG tablet Commonly known as:  REQUIP Take 0.5 mg by mouth 3 (three) times daily.   Symbicort 160-4.5 MCG/ACT inhaler Generic drug:  budesonide-formoterol Inhale 2 puffs into  the lungs 2 (two) times daily.   tamsulosin 0.4 MG Caps capsule Commonly known as:  FLOMAX Take 0.4 mg by mouth daily after supper.   traMADol 50 MG tablet Commonly known as:  ULTRAM Take 50 mg by mouth every 6 (six) hours as needed for moderate pain.       Discharge Assessment: Vitals:   05/30/18 2047 05/31/18 0511  BP: 122/67 (!) 96/43  Pulse: 85 70  Resp:    Temp: 97.6 F (36.4 C) 97.8 F (36.6 C)  SpO2: 97% 95%   Skin clean, dry and intact without evidence of skin break down, no evidence of skin tears noted. IV catheter discontinued intact. Site without signs and symptoms of complications - no redness or edema noted at insertion site, patient denies c/o pain - only slight  tenderness at site.  Dressing with slight pressure applied.  D/c Instructions-Education: Discharge instructions given to patient/family with verbalized understanding. D/c education completed with patient/family including follow up instructions, medication list, d/c activities limitations if indicated, with other d/c instructions as indicated by MD - patient able to verbalize understanding, all questions fully answered. Patient instructed to return to ED, call 911, or call MD for any changes in condition.  Patient escorted via [place], and D/C home via private auto.  Hiram Comber, RN 05/31/2018 9:40 AM

## 2018-05-31 NOTE — Care Management Important Message (Signed)
Important Message  Patient Details  Name: Chris Braun MRN: [id number] Date of Birth: Aug 30, 1935   Medicare Important Message Given:  Yes    [name] 05/31/2018, 4:27 PM

## 2018-05-31 NOTE — Progress Notes (Signed)
1 Day Post-Op  Subjective: He is doing well sp right ureteral stenting for a 19mm proximal stone with AKI.  His pain and nausea have resolved.  His Cr has fallen to 1.55.    ROS:  Review of Systems  Constitutional: Negative for chills and fever.  Gastrointestinal: Negative for nausea.  Genitourinary: Negative for flank pain.    Anti-infectives: Anti-infectives (From admission, onward)   Start     Dose/Rate Route Frequency Ordered Stop   05/31/18 0840  azithromycin (ZITHROMAX) 500 mg in sodium chloride 0.9 % 250 mL IVPB     500 mg 250 mL/hr over 60 Minutes Intravenous Every 24 hours 05/30/18 0855     05/30/18 1000  azithromycin (ZITHROMAX) tablet 500 mg  Status:  Discontinued     500 mg Oral Daily 05/29/18 1226 05/30/18 0855   05/30/18 1000  cefTRIAXone (ROCEPHIN) 1 g in sodium chloride 0.9 % 100 mL IVPB     1 g 200 mL/hr over 30 Minutes Intravenous Every 24 hours 05/30/18 0855     05/29/18 1400  cephALEXin (KEFLEX) capsule 500 mg  Status:  Discontinued     500 mg Oral Every 8 hours 05/29/18 1226 05/30/18 0855   05/28/18 0600  azithromycin (ZITHROMAX) 500 mg in sodium chloride 0.9 % 250 mL IVPB  Status:  Discontinued     500 mg 250 mL/hr over 60 Minutes Intravenous Every 24 hours 05/27/18 1317 05/29/18 1225   05/28/18 0500  cefTRIAXone (ROCEPHIN) 1 g in sodium chloride 0.9 % 100 mL IVPB  Status:  Discontinued     1 g 200 mL/hr over 30 Minutes Intravenous Every 24 hours 05/27/18 1317 05/29/18 1225   05/27/18 1230  cefTRIAXone (ROCEPHIN) 1 g in sodium chloride 0.9 % 100 mL IVPB  Status:  Discontinued     1 g 200 mL/hr over 30 Minutes Intravenous Every 24 hours 05/27/18 1124 05/27/18 1317   05/27/18 1230  azithromycin (ZITHROMAX) 500 mg in sodium chloride 0.9 % 250 mL IVPB  Status:  Discontinued     500 mg 250 mL/hr over 60 Minutes Intravenous Every 24 hours 05/27/18 1124 05/27/18 1317      Current Facility-Administered Medications  Medication Dose Route Frequency Provider Last  Rate Last Dose  . 0.9 %  sodium chloride infusion  250 mL Intravenous PRN Irine Seal, MD      . 0.9 %  sodium chloride infusion   Intravenous Continuous Irine Seal, MD 100 mL/hr at 05/31/18 0225    . acetaminophen (TYLENOL) tablet 650 mg  650 mg Oral Q6H PRN Irine Seal, MD   650 mg at 05/28/18 2671  . aspirin EC tablet 81 mg  81 mg Oral Daily Irine Seal, MD   81 mg at 05/30/18 0839  . atorvastatin (LIPITOR) tablet 40 mg  40 mg Oral Daily Irine Seal, MD   40 mg at 05/30/18 0840  . azithromycin (ZITHROMAX) 500 mg in sodium chloride 0.9 % 250 mL IVPB  500 mg Intravenous Q24H Irine Seal, MD      . benzonatate (TESSALON) capsule 100 mg  100 mg Oral BID Irine Seal, MD   100 mg at 05/30/18 2112  . cefTRIAXone (ROCEPHIN) 1 g in sodium chloride 0.9 % 100 mL IVPB  1 g Intravenous Q24H Irine Seal, MD 200 mL/hr at 05/30/18 0949 1 g at 05/30/18 0949  . donepezil (ARICEPT) tablet 5 mg  5 mg Oral QHS Irine Seal, MD   5 mg at 05/30/18 2112  . heparin injection  5,000 Units  5,000 Units Subcutaneous Q8H Irine Seal, MD   5,000 Units at 05/31/18 [phone]  . HYDROcodone-acetaminophen (NORCO/VICODIN) 5-325 MG per tablet 1 tablet  1 tablet Oral Q6H PRN Irine Seal, MD   1 tablet at 05/30/18 0529  . lactated ringers infusion   Intravenous Continuous Irine Seal, MD      . metoprolol succinate (TOPROL-XL) 24 hr tablet 12.5 mg  12.5 mg Oral QHS Irine Seal, MD   12.5 mg at 05/30/18 2112  . mometasone-formoterol (DULERA) 200-5 MCG/ACT inhaler 2 puff  2 puff Inhalation BID Irine Seal, MD   2 puff at 05/29/18 0813  . montelukast (SINGULAIR) tablet 10 mg  10 mg Oral QHS Irine Seal, MD   10 mg at 05/30/18 2112  . ondansetron (ZOFRAN) tablet 4 mg  4 mg Oral Q6H PRN Irine Seal, MD       Or  . ondansetron [facility]) injection 4 mg  4 mg Intravenous Q6H PRN Irine Seal, MD   4 mg at 05/29/18 2210  . polyethylene glycol (MIRALAX / GLYCOLAX) packet 17 g  17 g Oral Daily Irine Seal, MD   17 g at 05/30/18 0839  . rOPINIRole  (REQUIP) tablet 0.5 mg  0.5 mg Oral TID Irine Seal, MD   0.5 mg at 05/30/18 2111  . senna-docusate (Senokot-S) tablet 1 tablet  1 tablet Oral BID Irine Seal, MD   1 tablet at 05/30/18 2112  . sodium chloride flush (NS) 0.9 % injection 3 mL  3 mL Intravenous Q12H Irine Seal, MD   3 mL at 05/30/18 0840  . sodium chloride flush (NS) 0.9 % injection 3 mL  3 mL Intravenous PRN Irine Seal, MD      . tamsulosin [facility]) capsule 0.4 mg  0.4 mg Oral Daily Irine Seal, MD   0.4 mg at 05/30/18 0840  . traMADol (ULTRAM) tablet 50 mg  50 mg Oral Q6H PRN Irine Seal, MD   50 mg at 05/31/18 0650     Objective: Vital signs in last 24 hours: Temp:  [97.3 F (36.3 C)-97.8 F (36.6 C)] 97.8 F (36.6 C) (04/13 0511) Pulse Rate:  [70-94] 70 (04/13 0511) Resp:  [12-21] 20 (04/12 2046) BP: (93-122)/(43-70) 96/43 (04/13 0511) SpO2:  [95 %-100 %] 95 % (04/13 0511) Weight:  [72.4 kg] 72.4 kg (04/13 0500)  Intake/Output from previous day: 04/12 0701 - 04/13 0700 In: 1252.7 [P.O.:90; I.V.:1062.7; IV Piggyback:100] Out: 755 [Urine:750; Blood:5] Intake/Output this shift: No intake/output data recorded.   Physical Exam Vitals signs reviewed.  Constitutional:      Appearance: Normal appearance.  Abdominal:     General: Abdomen is flat.     Palpations: Abdomen is soft.     Tenderness: There is no abdominal tenderness.  Neurological:     Mental Status: He is alert.     Lab Results:  Recent Labs    05/30/18 0222 05/31/18 0322  WBC 8.7 7.2  HGB 8.8* 7.3*  HCT 27.3* 21.9*  PLT 182 200   BMET Recent Labs    05/30/18 0804 05/31/18 0322  NA 143 146*  K 5.2* 4.2  CL 112* 114*  CO2 21* 21*  GLUCOSE 109* 123*  BUN 80* 64*  CREATININE 1.85* 1.55*  CALCIUM 8.4* 8.3*   PT/INR No results for input(s): LABPROT, INR in the last 72 hours. ABG No results for input(s): PHART, HCO3 in the last 72 hours.  Invalid input(s): PCO2, PO2  Studies/Results: Dg Cystogram  Result Date:  05/31/2018  CLINICAL DATA:  Right ureteral stent placement. Right ureteral stone. EXAM: OPERATIVE retrograde urethrogram  7 VIEWS TECHNIQUE: Fluoroscopic spot image(s) were submitted for interpretation post-operatively. COMPARISON:  CT of the abdomen pelvis/05/27/2018 FINDINGS: Obstructing stone is again noted at the L4 level on the right. Is slight dilation of the ureter above this level. Ureteral stent is placed. Mild right-sided hydronephrosis is evident. IMPRESSION: 1. Interval placement right ureteral stent with mild right-sided hydronephrosis. 2. Prior stent placement, an obstructing stone is noted at the L4 level. Electronically Signed   By: [place] Morelle M.D.   On: 05/31/2018 04:15   Dg Abd Portable 1v  Result Date: 05/29/2018 CLINICAL DATA:  Emesis. EXAM: PORTABLE ABDOMEN - 1 VIEW COMPARISON:  CT scan of May 27, 2018.  Radiograph of May 25, 2018. FINDINGS: The bowel gas pattern is normal. Probable distal right ureteral calculus is noted to the right of L4 vertebral body. IMPRESSION: No definite evidence of bowel obstruction or ileus. Probable distal right ureteral calculus as described above. Electronically Signed   By: Marijo Conception, M.D.   On: 05/29/2018 22:48   Dg C-arm 1-60 Min-no Report  Result Date: 05/30/2018 Fluoroscopy was utilized by the requesting physician.  No radiographic interpretation.   Labs reviewed.   Assessment and Plan: Right proximal stone with obstruction and AKI improving post stent insertion.     I will sign off.  As noted, he needs f/u with Dr. Joie Bimler in [place] after discharge for further stone management.       LOS: 4 days    Irine Seal 05/31/2018 [phone] ID: Birder Robson, male   DOB: December 31, 1935, 83 y.o.   MRN: [id number]

## 2018-05-31 NOTE — Discharge Summary (Signed)
Physician Discharge Summary  Chris Braun VVO:[id number] DOB: September 21, 1935 DOA: 05/27/2018  PCP: [name]., MD  Admit date: 05/27/2018 Discharge date: 05/31/2018  Admitted From: Home Disposition: Home  Recommendations for Outpatient Follow-up:  1.   Follow up with PCP in 1 week with repeat CBC/BMP 2.   Follow up in ED if symptoms worsen or new appear Follow-up with urology as an outpatient   Home Health: No.  Wife was not interested in home health RN or PT Equipment/Devices: None  Discharge Condition: Guarded CODE STATUS: Full Diet recommendation: Heart healthy  Brief/Interim Summary: 83 year old male with history of CAD, hyperlipidemia, dementia, COPD, chronic diastolic CHF was transferred from [facility] ED on 05/27/2018 with fever and cough with some flank pain.  Influenza was negative.  CT of the chest abdomen pelvis showed probable right upper lobe pneumonia, moderate to advanced emphysema, obstructing 5 mm stone right mid ureter with moderate hydroureteronephrosis.  Urology was consulted, consultation was deferred for COVID-19 rule out.  He was started on intravenous antibiotics which were subsequently switched to oral antibiotics.  COVID-19 testing has been negative.  Urology was subsequently consulted and patient underwent a cystoscopy and right ureteral stent placement on 05/30/2018.  Urology has cleared the patient for discharge.  Renal function has improved.  His respiratory status is stable.  He will be discharged on 2 more days of oral antibiotics.    Discharge Diagnoses:  Principal Problem:   Acute hypoxemic respiratory failure (HCC) Active Problems:   CAD (coronary artery disease)   Dyslipidemia   AKI (acute kidney injury) ([place])   Right kidney stone   Right upper lobe pneumonia (HCC)   Right upper lobar community-acquired pneumonia with hypoxia -Initially required oxygen via nasal cannula 2 L/min.  Currently on room air. -Currently on Rocephin and  Zithromax. -COVID-19 testing negative. -Cultures have been negative so far.  Respiratory status stable.  Will discharge him on oral cefdinir for 2 more days.  COPD -Currently stable.  No wheezing.  Continue home regimen. CT chest at [facility] showed advanced emphysema.  Outpatient follow-up with pulmonary as needed.  Acute kidney injury -Baseline creatinine is 1.1.  Creatinine was 1.8 in [place] -Patient was treated with IV fluids.  DC IV fluids.  Creatinine is 1.55 today.    Outpatient follow-up of BMP.  Right ureteric stone with moderate hydroureteronephrosis -CT abdomen at [facility] had shown obstructing 5 mm right mid ureter stone with moderate hydronephrosis.  -Status post cystoscopy and right ureteral stent placement on 05/30/2018 by urology.  Urology has cleared the patient for discharge.  Patient will need outpatient follow-up with regular urologist in [place].  CAD  -Continue aspirin.    Resume Plavix.  Hyperlipidemia--continue Lipitor  Dementia -Continue Aricept.  Palliative care evaluation for goals of care is pending.  This can be done as an outpatient.  Hypertension--Toprol held since admission due to bradycardia.    Resume Toprol on discharge.  Infrarenal aortic aneurysm -Stable infrarenal aortic aneurysm max dimension 4.3 cm, recommend follow-up ultrasound in 1 year  Discharge Instructions  Discharge Instructions    Call MD for:  difficulty breathing, headache or visual disturbances   Complete by:  As directed    Call MD for:  extreme fatigue   Complete by:  As directed    Call MD for:  persistant dizziness or light-headedness   Complete by:  As directed    Call MD for:  temperature >100.4   Complete by:  As directed    Diet -  low sodium heart healthy   Complete by:  As directed    Increase activity slowly   Complete by:  As directed      Allergies as of 05/31/2018      Reactions   Other Nausea And Vomiting, Other (See Comments)   pneumonia vaccine-  chills, vomiting, fever, lost use of legs and body function. Had a fall post inj. (05/25/2013)   Ibuprofen    Heart doctor advised he cannot take this   Oxycodone    Mental status changes      Medication List    TAKE these medications   albuterol 108 (90 Base) MCG/ACT inhaler Commonly known as:  PROVENTIL HFA;VENTOLIN HFA Inhale 2 puffs into the lungs every 6 (six) hours as needed for wheezing.   aspirin EC 81 MG tablet Take 81 mg by mouth daily.   atorvastatin 40 MG tablet Commonly known as:  LIPITOR Take 20 mg by mouth daily.   B-12 PO Take 2,500 mcg by mouth daily.   benzonatate 200 MG capsule Commonly known as:  TESSALON Take 200 mg by mouth at bedtime.   calcitonin (salmon) 200 UNIT/ACT nasal spray Commonly known as:  MIACALCIN/FORTICAL Place 1 spray into alternate nostrils daily.   calcium-vitamin D 500-200 MG-UNIT tablet Take 1 tablet by mouth daily.   cefdinir 300 MG capsule Commonly known as:  OMNICEF Take 1 capsule (300 mg total) by mouth every 12 (twelve) hours for 2 days.   clopidogrel 75 MG tablet Commonly known as:  PLAVIX Take 75 mg by mouth daily.   donepezil 5 MG tablet Commonly known as:  ARICEPT Take 5 mg by mouth at bedtime.   guaiFENesin 600 MG 12 hr tablet Commonly known as:  MUCINEX Take 600 mg by mouth 2 (two) times daily.   HYDROcodone-acetaminophen 5-325 MG tablet Commonly known as:  NORCO/VICODIN Take 1 tablet by mouth every 6 (six) hours as needed for moderate pain.   ipratropium-albuterol 0.5-2.5 (3) MG/3ML Soln Commonly known as:  DUONEB Take 3 mLs by nebulization 4 (four) times daily.   IRON PO Take 27 mg by mouth daily.   lactulose 10 GM/15ML solution Commonly known as:  CHRONULAC Take 10 g by mouth daily as needed for mild constipation.   loratadine 10 MG tablet Commonly known as:  CLARITIN Take 10 mg by mouth.   Lutein 40 MG Caps Take 40 mg by mouth daily.   metoprolol succinate 25 MG 24 hr tablet Commonly known  as:  TOPROL-XL Take 12.5 mg by mouth at bedtime.   montelukast 10 MG tablet Commonly known as:  SINGULAIR Take 10 mg by mouth at bedtime.   MULTIVITAMIN PO Take 1 tablet by mouth daily.   nitroGLYCERIN 0.4 MG SL tablet Commonly known as:  NITROSTAT Place 1 tablet (0.4 mg total) under the tongue every 5 (five) minutes as needed for chest pain.   ondansetron 4 MG tablet Commonly known as:  ZOFRAN Take 4 mg by mouth every 8 (eight) hours as needed for nausea or vomiting.   pantoprazole 40 MG tablet Commonly known as:  PROTONIX Take 40 mg by mouth daily.   polyethylene glycol 17 g packet Commonly known as:  MIRALAX / GLYCOLAX Take 17 g by mouth daily. What changed:  when to take this   rOPINIRole 0.5 MG tablet Commonly known as:  REQUIP Take 0.5 mg by mouth 3 (three) times daily.   Symbicort 160-4.5 MCG/ACT inhaler Generic drug:  budesonide-formoterol Inhale 2 puffs into the lungs 2 (  two) times daily.   tamsulosin 0.4 MG Caps capsule Commonly known as:  FLOMAX Take 0.4 mg by mouth daily after supper.   traMADol 50 MG tablet Commonly known as:  ULTRAM Take 50 mg by mouth every 6 (six) hours as needed for moderate pain.      Follow-up Information    Joie Bimler, MD.   Specialty:  Urology Why:  Please contact Dr. Ara Kussmaul office to arrange followup after discharge.  Contact information: [place] [address] [phone]        [name]., MD. Schedule an appointment as soon as possible for a visit in 1 week(s).   Specialty:  Family Medicine Contact information: [address] [place] [address] [phone]          Allergies  Allergen Reactions  . Other Nausea And Vomiting and Other (See Comments)    pneumonia vaccine- chills, vomiting, fever, lost use of legs and body function. Had a fall post inj. (05/25/2013)  . Ibuprofen     Heart doctor advised he cannot take this  . Oxycodone     Mental status changes     Consultations:  Urology.    Palliative care evaluation pending   Procedures/Studies: Dg Cystogram  Result Date: 05/31/2018 CLINICAL DATA:  Right ureteral stent placement. Right ureteral stone. EXAM: OPERATIVE retrograde urethrogram  7 VIEWS TECHNIQUE: Fluoroscopic spot image(s) were submitted for interpretation post-operatively. COMPARISON:  CT of the abdomen pelvis/05/27/2018 FINDINGS: Obstructing stone is again noted at the L4 level on the right. Is slight dilation of the ureter above this level. Ureteral stent is placed. Mild right-sided hydronephrosis is evident. IMPRESSION: 1. Interval placement right ureteral stent with mild right-sided hydronephrosis. 2. Prior stent placement, an obstructing stone is noted at the L4 level. Electronically Signed   By: San Morelle M.D.   On: 05/31/2018 04:15   Dg Abd Portable 1v  Result Date: 05/29/2018 CLINICAL DATA:  Emesis. EXAM: PORTABLE ABDOMEN - 1 VIEW COMPARISON:  CT scan of May 27, 2018.  Radiograph of May 25, 2018. FINDINGS: The bowel gas pattern is normal. Probable distal right ureteral calculus is noted to the right of L4 vertebral body. IMPRESSION: No definite evidence of bowel obstruction or ileus. Probable distal right ureteral calculus as described above. Electronically Signed   By: Marijo Conception, M.D.   On: 05/29/2018 22:48   Dg C-arm 1-60 Min-no Report  Result Date: 05/30/2018 Fluoroscopy was utilized by the requesting physician.  No radiographic interpretation.     cystoscopy and right ureteral stent placement on 05/30/2018  Subjective: Patient seen and examined at bedside.  He is awake, more conversant today.  Still slightly confused.  States that he is hungry and wants to go home.  No overnight fever or vomiting reported.  Spoke to wife on phone.  Discharge Exam: Vitals:   05/30/18 2047 05/31/18 0511  BP: 122/67 (!) 96/43  Pulse: 85 70  Resp:    Temp: 97.6 F (36.4 C) 97.8 F (36.6 C)  SpO2: 97% 95%     General: Pt is awake, no distress.  Slightly confused.   Cardiovascular: rate controlled, S1/S2 + Respiratory: bilateral decreased breath sounds at bases Abdominal: Soft, NT, ND, bowel sounds + Extremities: no edema, no cyanosis    The results of significant diagnostics from this hospitalization (including imaging, microbiology, ancillary and laboratory) are listed below for reference.     Microbiology: Recent Results (from the past 240 hour(s))  Surgical  pcr screen     Status: None   Collection Time: 05/30/18 10:57 AM  Result Value Ref Range Status   MRSA, PCR NEGATIVE NEGATIVE Final   Staphylococcus aureus NEGATIVE NEGATIVE Final    Comment: (NOTE) The Xpert SA Assay (FDA approved for NASAL specimens in patients 60 years of age and older), is one component of a comprehensive surveillance program. It is not intended to diagnose infection nor to guide or monitor treatment. Performed at [facility], [place] [address] [place], [place] [address]      Labs: BNP (last 3 results) No results for input(s): BNP in the last 8760 hours. Basic Metabolic Panel: Recent Labs  Lab 05/28/18 0552 05/29/18 0313 05/30/18 0222 05/30/18 0804 05/31/18 0322  NA 140 139 143 143 146*  K 4.0 4.0 5.4* 5.2* 4.2  CL 105 105 108 112* 114*  CO2 22 24 22  21* 21*  GLUCOSE 87 107* 112* 109* 123*  BUN 14 18 71* 80* 64*  CREATININE 1.72* 1.71* 2.02* 1.85* 1.55*  CALCIUM 8.1* 8.2* 8.2* 8.4* 8.3*  MG  --   --   --   --  1.8   Liver Function Tests: Recent Labs  Lab 05/28/18 0552 05/29/18 0313 05/30/18 0222 05/31/18 0322  AST 20 23 25  47*  ALT 15 17 20 27   ALKPHOS 77 78 58 55  BILITOT 0.9 1.0 0.8 0.6  PROT 5.3* 5.3* 4.6* 4.5*  ALBUMIN 2.4* 2.3* 2.1* 2.2*   No results for input(s): LIPASE, AMYLASE in the last 168 hours. No results for input(s): AMMONIA in the last 168 hours. CBC: Recent Labs  Lab 05/27/18 1136 05/28/18 0552 05/29/18 0313 05/29/18 2238 05/30/18 0222  05/31/18 0322  WBC 10.7* 11.1* 10.1  --  8.7 7.2  NEUTROABS  --  8.5* 7.8*  --  7.2 6.0  HGB 12.8* 12.2* 11.6* 9.3* 8.8* 7.3*  HCT 37.3* 35.5* 34.3* 29.0* 27.3* 21.9*  MCV 88.4 89.4 87.9  --  90.7 89.4  PLT 141* 135* 157  --  182 200   Cardiac Enzymes: No results for input(s): CKTOTAL, CKMB, CKMBINDEX, TROPONINI in the last 168 hours. BNP: Invalid input(s): POCBNP CBG: No results for input(s): GLUCAP in the last 168 hours. D-Dimer Recent Labs    05/29/18 0313  DDIMER 2.29*   Hgb A1c No results for input(s): HGBA1C in the last 72 hours. Lipid Profile No results for input(s): CHOL, HDL, LDLCALC, TRIG, CHOLHDL, LDLDIRECT in the last 72 hours. Thyroid function studies No results for input(s): TSH, T4TOTAL, T3FREE, THYROIDAB in the last 72 hours.  Invalid input(s): FREET3 Anemia work up [employer]    05/29/18 0313  FERRITIN 123   Urinalysis No results found for: COLORURINE, APPEARANCEUR, [place], [place], [place], [place], [place], [place], PROTEINUR, UROBILINOGEN, NITRITE, LEUKOCYTESUR Sepsis Labs Invalid input(s): PROCALCITONIN,  WBC,  LACTICIDVEN Microbiology Recent Results (from the past 240 hour(s))  Surgical pcr screen     Status: None   Collection Time: 05/30/18 10:57 AM  Result Value Ref Range Status   MRSA, PCR NEGATIVE NEGATIVE Final   Staphylococcus aureus NEGATIVE NEGATIVE Final    Comment: (NOTE) The Xpert SA Assay (FDA approved for NASAL specimens in patients 46 years of age and older), is one component of a comprehensive surveillance program. It is not intended to diagnose infection nor to guide or monitor treatment. Performed at [facility], [place] [address], [place], [place] [address]      Time coordinating discharge: 35 minutes  SIGNED:  Aline August, MD  [facility] 05/31/2018, 8:38 AM

## 2018-05-31 NOTE — Evaluation (Signed)
Physical Therapy Evaluation Patient Details Name: Chris Braun MRN: [id number] DOB: [birth year] Today's Date: 05/31/2018   History of Present Illness  Pt is an 83 y/o male admitted secondary to respiratory failure. Pt found to have R upper lobe PNA, COVID negative. Pt also found to have R ureteric stone and is s/p R uretal stent. PMH includes CAD, COPD, Dementia, and HTN.   Clinical Impression  Pt admitted secondary to problem above with deficits below. Pt with unsteadiness and requiring min to min guard A for short distance ambulation within the room. Pt refusing further mobility as he was afraid he was going to have another BM. Feel pt will require 24/7 assist at home. If 24/7 assist able to be provided, recommend HHPT and HHOT. However, if 24/7 assist not available, feel pt will require SNF level therapies. Will continue to follow acutely to maximize functional mobility independence and safety.     Follow Up Recommendations Home health PT;Supervision/Assistance - 24 hour(if he does not have 24/7 will require SNF )    Equipment Recommendations  None recommended by PT    Recommendations for Other Services OT consult     Precautions / Restrictions Precautions Precautions: Fall Restrictions Weight Bearing Restrictions: No      Mobility  Bed Mobility Overal bed mobility: Needs Assistance Bed Mobility: Supine to Sit     Supine to sit: Min assist     General bed mobility comments: Min A for trunk elevation. Increased time to come to sitting at EOB.   Transfers Overall transfer level: Needs assistance Equipment used: Rolling walker (2 wheeled) Transfers: Sit to/from Stand Sit to Stand: Min assist         General transfer comment: Min A for lift assist and steadying. Pt able to stand for 2-3 mins for clean up following BM with UE support on RW.   Ambulation/Gait Ambulation/Gait assistance: Min assist;Min guard Gait Distance (Feet): 15 Feet Assistive device: Rolling  walker (2 wheeled) Gait Pattern/deviations: Step-through pattern;Decreased stride length;Shuffle Gait velocity: Decreased    General Gait Details: Slow, unsteady gait. Pt with shuffle gait pattern and required cues for increasing step height. Required min to min guard A for steadying assist. Pt requesting to stay within the room as he was afraid he would have a second BM.   Stairs            Wheelchair Mobility    Modified Rankin (Stroke Patients Only)       Balance Overall balance assessment: Needs assistance Sitting-balance support: No upper extremity supported;Feet supported Sitting balance-Leahy Scale: Fair     Standing balance support: Bilateral upper extremity supported;During functional activity Standing balance-Leahy Scale: Poor Standing balance comment: Reliant on UE support                              Pertinent Vitals/Pain Pain Assessment: No/denies pain    Home Living Family/patient expects to be discharged to:: Private residence Living Arrangements: Spouse/significant other;Children Available Help at Discharge: Family Type of Home: House Home Access: Stairs to enter Entrance Stairs-Rails: None Entrance Stairs-Number of Steps: 1 Home Layout: One level Home Equipment: Walker - 2 wheels Additional Comments: Unsure of accuracy as pt with dementia at baseline.     Prior Function Level of Independence: Independent with assistive device(s)         Comments: Reports using RW for ambulation at home.      Hand Dominance  Extremity/Trunk Assessment   Upper Extremity Assessment Upper Extremity Assessment: Defer to OT evaluation    Lower Extremity Assessment Lower Extremity Assessment: Generalized weakness    Cervical / Trunk Assessment Cervical / Trunk Assessment: Kyphotic  Communication   Communication: No difficulties  Cognition Arousal/Alertness: Awake/alert Behavior During Therapy: WFL for tasks  assessed/performed Overall Cognitive Status: No family/caregiver present to determine baseline cognitive functioning                                 General Comments: Pt with baseline dementia. Upon entry, pt soiled in stool and had gotten on his hands and on his sheets on his bed.       General Comments      Exercises     Assessment/Plan    PT Assessment Patient needs continued PT services  PT Problem List Decreased strength;Decreased balance;Decreased mobility;Decreased cognition;Decreased knowledge of use of DME;Decreased safety awareness;Decreased knowledge of precautions       PT Treatment Interventions DME instruction;Gait training;Functional mobility training;Therapeutic exercise;Therapeutic activities;Stair training;Balance training;Cognitive remediation;Patient/family education    PT Goals (Current goals can be found in the Care Plan section)  Acute Rehab PT Goals Patient Stated Goal: none stated PT Goal Formulation: With patient Time For Goal Achievement: 06/14/18 Potential to Achieve Goals: Fair    Frequency Min 3X/week   Barriers to discharge Other (comment) Unsure of caregiver support     Co-evaluation               AM-PAC PT "6 Clicks" Mobility  Outcome Measure Help needed turning from your back to your side while in a flat bed without using bedrails?: A Little Help needed moving from lying on your back to sitting on the side of a flat bed without using bedrails?: A Little Help needed moving to and from a bed to a chair (including a wheelchair)?: A Little Help needed standing up from a chair using your arms (e.g., wheelchair or bedside chair)?: A Little Help needed to walk in hospital room?: A Little Help needed climbing 3-5 steps with a railing? : A Lot 6 Click Score: 17    End of Session Equipment Utilized During Treatment: Gait belt Activity Tolerance: Patient tolerated treatment well Patient left: in chair;with call bell/phone  within reach;with chair alarm set Nurse Communication: Mobility status PT Visit Diagnosis: Unsteadiness on feet (R26.81);Muscle weakness (generalized) (M62.81);Difficulty in walking, not elsewhere classified (R26.2)    Time: 6226-3335 PT Time Calculation (min) (ACUTE ONLY): 20 min   Charges:   PT Evaluation $PT Eval Moderate Complexity: [place], PT, DPT  Acute Rehabilitation Services  Pager: [phone] Office: [phone]   Rudean Hitt 05/31/2018, 11:57 AM

## 2018-06-01 ENCOUNTER — Other Ambulatory Visit: Payer: PPO

## 2018-06-01 DIAGNOSIS — F0390 Unspecified dementia without behavioral disturbance: Secondary | ICD-10-CM | POA: Diagnosis not present

## 2018-06-01 DIAGNOSIS — I2510 Atherosclerotic heart disease of native coronary artery without angina pectoris: Secondary | ICD-10-CM | POA: Diagnosis not present

## 2018-06-01 DIAGNOSIS — Z79899 Other long term (current) drug therapy: Secondary | ICD-10-CM | POA: Diagnosis not present

## 2018-06-01 DIAGNOSIS — R29898 Other symptoms and signs involving the musculoskeletal system: Secondary | ICD-10-CM | POA: Diagnosis not present

## 2018-06-01 DIAGNOSIS — J449 Chronic obstructive pulmonary disease, unspecified: Secondary | ICD-10-CM | POA: Diagnosis not present

## 2018-06-01 DIAGNOSIS — M818 Other osteoporosis without current pathological fracture: Secondary | ICD-10-CM | POA: Diagnosis not present

## 2018-06-01 DIAGNOSIS — R197 Diarrhea, unspecified: Secondary | ICD-10-CM | POA: Diagnosis not present

## 2018-06-01 DIAGNOSIS — R531 Weakness: Secondary | ICD-10-CM | POA: Diagnosis not present

## 2018-06-01 DIAGNOSIS — S22060A Wedge compression fracture of T7-T8 vertebra, initial encounter for closed fracture: Secondary | ICD-10-CM | POA: Diagnosis not present

## 2018-06-01 DIAGNOSIS — E538 Deficiency of other specified B group vitamins: Secondary | ICD-10-CM | POA: Diagnosis not present

## 2018-06-01 DIAGNOSIS — N200 Calculus of kidney: Secondary | ICD-10-CM | POA: Diagnosis not present

## 2018-06-03 DIAGNOSIS — F0390 Unspecified dementia without behavioral disturbance: Secondary | ICD-10-CM | POA: Diagnosis not present

## 2018-06-03 DIAGNOSIS — I2510 Atherosclerotic heart disease of native coronary artery without angina pectoris: Secondary | ICD-10-CM | POA: Diagnosis not present

## 2018-06-03 DIAGNOSIS — G603 Idiopathic progressive neuropathy: Secondary | ICD-10-CM | POA: Diagnosis not present

## 2018-06-03 DIAGNOSIS — Z7902 Long term (current) use of antithrombotics/antiplatelets: Secondary | ICD-10-CM | POA: Diagnosis not present

## 2018-06-03 DIAGNOSIS — N200 Calculus of kidney: Secondary | ICD-10-CM | POA: Diagnosis not present

## 2018-06-03 DIAGNOSIS — Z9181 History of falling: Secondary | ICD-10-CM | POA: Diagnosis not present

## 2018-06-03 DIAGNOSIS — D649 Anemia, unspecified: Secondary | ICD-10-CM | POA: Diagnosis not present

## 2018-06-03 DIAGNOSIS — M818 Other osteoporosis without current pathological fracture: Secondary | ICD-10-CM | POA: Diagnosis not present

## 2018-06-03 DIAGNOSIS — E538 Deficiency of other specified B group vitamins: Secondary | ICD-10-CM | POA: Diagnosis not present

## 2018-06-03 DIAGNOSIS — F329 Major depressive disorder, single episode, unspecified: Secondary | ICD-10-CM | POA: Diagnosis not present

## 2018-06-03 DIAGNOSIS — I5032 Chronic diastolic (congestive) heart failure: Secondary | ICD-10-CM | POA: Diagnosis not present

## 2018-06-03 DIAGNOSIS — I110 Hypertensive heart disease with heart failure: Secondary | ICD-10-CM | POA: Diagnosis not present

## 2018-06-03 DIAGNOSIS — J329 Chronic sinusitis, unspecified: Secondary | ICD-10-CM | POA: Diagnosis not present

## 2018-06-03 DIAGNOSIS — G8929 Other chronic pain: Secondary | ICD-10-CM | POA: Diagnosis not present

## 2018-06-03 DIAGNOSIS — M545 Low back pain: Secondary | ICD-10-CM | POA: Diagnosis not present

## 2018-06-03 DIAGNOSIS — Z7982 Long term (current) use of aspirin: Secondary | ICD-10-CM | POA: Diagnosis not present

## 2018-06-03 DIAGNOSIS — E785 Hyperlipidemia, unspecified: Secondary | ICD-10-CM | POA: Diagnosis not present

## 2018-06-03 DIAGNOSIS — J181 Lobar pneumonia, unspecified organism: Secondary | ICD-10-CM | POA: Diagnosis not present

## 2018-06-03 DIAGNOSIS — G2581 Restless legs syndrome: Secondary | ICD-10-CM | POA: Diagnosis not present

## 2018-06-03 DIAGNOSIS — J440 Chronic obstructive pulmonary disease with acute lower respiratory infection: Secondary | ICD-10-CM | POA: Diagnosis not present

## 2018-06-03 DIAGNOSIS — I719 Aortic aneurysm of unspecified site, without rupture: Secondary | ICD-10-CM | POA: Diagnosis not present

## 2018-06-03 DIAGNOSIS — Z8673 Personal history of transient ischemic attack (TIA), and cerebral infarction without residual deficits: Secondary | ICD-10-CM | POA: Diagnosis not present

## 2018-06-07 ENCOUNTER — Other Ambulatory Visit: Payer: Self-pay | Admitting: Cardiology

## 2018-06-07 DIAGNOSIS — R2243 Localized swelling, mass and lump, lower limb, bilateral: Secondary | ICD-10-CM | POA: Diagnosis not present

## 2018-06-07 DIAGNOSIS — R0600 Dyspnea, unspecified: Secondary | ICD-10-CM | POA: Diagnosis not present

## 2018-06-07 DIAGNOSIS — J449 Chronic obstructive pulmonary disease, unspecified: Secondary | ICD-10-CM | POA: Diagnosis not present

## 2018-06-07 DIAGNOSIS — R600 Localized edema: Secondary | ICD-10-CM | POA: Diagnosis not present

## 2018-06-07 DIAGNOSIS — F0390 Unspecified dementia without behavioral disturbance: Secondary | ICD-10-CM | POA: Diagnosis not present

## 2018-06-07 DIAGNOSIS — I2510 Atherosclerotic heart disease of native coronary artery without angina pectoris: Secondary | ICD-10-CM | POA: Diagnosis not present

## 2018-06-07 DIAGNOSIS — J439 Emphysema, unspecified: Secondary | ICD-10-CM | POA: Diagnosis not present

## 2018-06-07 DIAGNOSIS — Z8673 Personal history of transient ischemic attack (TIA), and cerebral infarction without residual deficits: Secondary | ICD-10-CM | POA: Diagnosis not present

## 2018-06-07 DIAGNOSIS — R778 Other specified abnormalities of plasma proteins: Secondary | ICD-10-CM | POA: Diagnosis not present

## 2018-06-07 DIAGNOSIS — Z955 Presence of coronary angioplasty implant and graft: Secondary | ICD-10-CM | POA: Diagnosis not present

## 2018-06-07 NOTE — Telephone Encounter (Signed)
Nitroglycerine sent to [facility]

## 2018-06-10 DIAGNOSIS — Z09 Encounter for follow-up examination after completed treatment for conditions other than malignant neoplasm: Secondary | ICD-10-CM | POA: Diagnosis not present

## 2018-06-10 DIAGNOSIS — N401 Enlarged prostate with lower urinary tract symptoms: Secondary | ICD-10-CM | POA: Diagnosis not present

## 2018-06-10 DIAGNOSIS — N201 Calculus of ureter: Secondary | ICD-10-CM | POA: Diagnosis not present

## 2018-06-10 DIAGNOSIS — N200 Calculus of kidney: Secondary | ICD-10-CM | POA: Diagnosis not present

## 2018-06-11 DIAGNOSIS — Z1212 Encounter for screening for malignant neoplasm of rectum: Secondary | ICD-10-CM | POA: Diagnosis not present

## 2018-06-15 ENCOUNTER — Encounter: Payer: Self-pay | Admitting: Cardiology

## 2018-06-15 ENCOUNTER — Other Ambulatory Visit: Payer: Self-pay

## 2018-06-15 ENCOUNTER — Telehealth (INDEPENDENT_AMBULATORY_CARE_PROVIDER_SITE_OTHER): Payer: PPO | Admitting: Cardiology

## 2018-06-15 VITALS — BP 126/69 | HR 91 | Ht 69.0 in | Wt 161.0 lb

## 2018-06-15 DIAGNOSIS — I2510 Atherosclerotic heart disease of native coronary artery without angina pectoris: Secondary | ICD-10-CM | POA: Diagnosis not present

## 2018-06-15 NOTE — Progress Notes (Signed)
Virtual Visit via Video Note   This visit type was conducted due to national recommendations for restrictions regarding the COVID-19 Pandemic (e.g. social distancing) in an effort to limit this patient's exposure and mitigate transmission in our community.  Due to his co-morbid illnesses, this patient is at least at moderate risk for complications without adequate follow up.  This format is felt to be most appropriate for this patient at this time.  All issues noted in this document were discussed and addressed.  A limited physical exam was performed with this format.  Please refer to the patient's chart for his consent to telehealth for [facility].   Evaluation Performed:  Follow-up visit  Date:  06/15/2018   ID:  Chris Braun, DOB Nov 23, 1935, MRN [id number]  Patient Location: Home Provider Location: Home  PCP:  [name]., MD  Cardiologist:  No primary care provider on file.  Electrophysiologist:  None   Chief Complaint: Pedal edema  History of Present Illness:    Chris Braun is a 83 y.o. male with multiple comorbidities including coronary artery disease advanced renal insufficiency.  He was admitted to the hospital for a renal stone.  He was found to have anemia also and received blood transfusion.  Subsequently the patient has felt very poorly.  He appears very frail by video conferencing today.  At the time of my evaluation, the patient is alert awake oriented and in no distress.  The patient does not have symptoms concerning for COVID-19 infection (fever, chills, cough, or new shortness of breath).    Past Medical History:  Diagnosis Date  . Asthma   . Cataracts, bilateral   . Compression fracture    BAck  . COPD (chronic obstructive pulmonary disease) ([place])   . Depression   . Diverticulosis    per colonscopy  . Gait abnormality 01/19/2018  . GERD (gastroesophageal reflux disease)   . High cholesterol   . Hypertension   . Kidney stones   . Macular  degeneration   . Memory disorder 01/19/2018  . Myocardial infarction ([place]) 2002  . 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 Osteoporosis   . Stroke [facility])    Past Surgical History:  Procedure Laterality Date  . CARDIAC CATHETERIZATION  2002,   stents   . CATARACT EXTRACTION Left 2016  . Colonscopy  2012  . CYSTOSCOPY W/ URETERAL STENT PLACEMENT Right 05/30/2018   Procedure: CYSTOSCOPY WITH RETROGRADE PYELOGRAM/URETERAL STENT PLACEMENT;  Surgeon: Irine Seal, MD;  Location: [place];  Service: Urology;  Laterality: Right;  . HIP FRACTURE SURGERY  2008   right  . HYDROCELE EXCISION  10/12/2014  . LITHOTRIPSY     2007, 2009, 2010, 2011, 2012, 2013  . LUMBAR LAMINECTOMY/DECOMPRESSION MICRODISCECTOMY  03/15/2012   Procedure: LUMBAR LAMINECTOMY/DECOMPRESSION MICRODISCECTOMY 1 LEVEL;  Surgeon: Elaina Hoops, MD;  Location: [place] [facility];  Service: Neurosurgery;  Laterality: Left;  Left lumbar three-four decompressive lumbar laminectomy, discectomy  . MAXIMUM ACCESS (MAS)POSTERIOR LUMBAR INTERBODY FUSION (PLIF) 2 LEVEL N/A 12/08/2012   Procedure: FOR MAXIMUM ACCESS (MAS) POSTERIOR LUMBAR INTERBODY FUSION (PLIF) 2 LEVEL;  Surgeon: Elaina Hoops, MD;  Location: [place] [facility]S;  Service: Neurosurgery;  Laterality: N/A;  FOR MAXIMUM ACCESS (MAS) POSTERIOR LUMBAR INTERBODY FUSION (PLIF) 2 LEVEL  . ROTATOR CUFF REPAIR  1999   bil   . [place]  . VT study     with Ablation     Current Meds  Medication Sig  . albuterol (PROVENTIL HFA;VENTOLIN HFA) 108 (90 BASE) MCG/ACT inhaler Inhale 2 puffs into the lungs every 6 (six) hours as needed for wheezing.  Marland Kitchen aspirin EC 81 MG tablet Take 81 mg by mouth daily.  Marland Kitchen atorvastatin (LIPITOR) 40 MG tablet Take 20 mg by mouth daily.   . benzonatate (TESSALON) 200 MG capsule Take 200 mg by mouth at bedtime.   . calcitonin, salmon, (MIACALCIN/FORTICAL) 200 UNIT/ACT nasal spray Place 1 spray into alternate nostrils daily.  . clopidogrel  (PLAVIX) 75 MG tablet Take 75 mg by mouth daily.  . Cyanocobalamin (B-12 PO) Take 2,500 mcg by mouth daily.  Marland Kitchen guaiFENesin (MUCINEX) 600 MG 12 hr tablet Take 600 mg by mouth 2 (two) times daily.  Marland Kitchen HYDROcodone-acetaminophen (NORCO/VICODIN) 5-325 MG tablet Take 1 tablet by mouth every 6 (six) hours as needed for moderate pain.  Marland Kitchen ipratropium-albuterol (DUONEB) 0.5-2.5 (3) MG/3ML SOLN Take 3 mLs by nebulization 4 (four) times daily.  . IRON PO Take 27 mg by mouth daily.  Marland Kitchen lactulose (CHRONULAC) 10 GM/15ML solution Take 10 g by mouth daily as needed for mild constipation.  Marland Kitchen loratadine (CLARITIN) 10 MG tablet Take 10 mg by mouth.  . Lutein 40 MG CAPS Take 40 mg by mouth daily.  . metoprolol succinate (TOPROL-XL) 25 MG 24 hr tablet Take 12.5 mg by mouth at bedtime.   . montelukast (SINGULAIR) 10 MG tablet Take 10 mg by mouth at bedtime.  . Multiple Vitamins-Minerals (MULTIVITAMIN PO) Take 1 tablet by mouth daily.  . nitroGLYCERIN (NITROSTAT) 0.4 MG SL tablet DISSOLVE 1 TABLET UNDER THE TONGUE EVERY 5 MINUTES AS NEEDED FOR CHEST PAIN. DO NOT EXCEED A TOTAL OF 3 DOSES IN 15 MINUTES.  Marland Kitchen ondansetron (ZOFRAN) 4 MG tablet Take 4 mg by mouth every 8 (eight) hours as needed for nausea or vomiting.  . pantoprazole (PROTONIX) 40 MG tablet Take 40 mg by mouth daily.  . polyethylene glycol (MIRALAX / GLYCOLAX) 17 g packet Take 17 g by mouth daily.  Marland Kitchen rOPINIRole (REQUIP) 0.5 MG tablet Take 0.5 mg by mouth 3 (three) times daily.  . SYMBICORT 160-4.5 MCG/ACT inhaler Inhale 2 puffs into the lungs 2 (two) times daily.  . tamsulosin (FLOMAX) 0.4 MG CAPS capsule Take 0.4 mg by mouth daily after supper.  . traMADol (ULTRAM) 50 MG tablet Take 50 mg by mouth every 6 (six) hours as needed for moderate pain.      Allergies:   Other; Ibuprofen; and Oxycodone   Social History   Tobacco Use  . Smoking status: Former Smoker    Packs/day: 1.00    Years: 45.00    Pack years: 45.00    Types: Cigarettes    Last attempt  to quit: 02/02/2001    Years since quitting: 17.3  . Smokeless tobacco: Current User    Types: Chew  Substance Use Topics  . Alcohol use: No  . Drug use: No     Family Hx: The patient's family history includes Hemolytic uremic syndrome in his mother; Stroke in his father.  ROS:   Please see the history of present illness.    Patient denies any chest pain orthopnea or PND he appears comfortable but very frail sick looking.  He is in no distress. All other systems reviewed and are negative.   Prior CV studies:   The following studies were reviewed today:  I reviewed hospital records extensively.  Patient's blood work was reviewed and he was significantly anemic and significant renal  insufficiency.  Labs/Other Tests and Data Reviewed:    EKG:  No ECG reviewed.  Recent Labs: 10/07/2017: TSH 1.260 05/31/2018: ALT 27; BUN 64; Creatinine, Ser 1.55; Hemoglobin 7.3; Magnesium 1.8; Platelets 200; Potassium 4.2; Sodium 146   Recent Lipid Panel No results found for: CHOL, TRIG, HDL, CHOLHDL, LDLCALC, LDLDIRECT  Wt Readings from Last 3 Encounters:  06/15/18 161 lb (73 kg)  05/31/18 159 lb 9.8 oz (72.4 kg)  04/27/18 163 lb (73.9 kg)     Objective:    Vital Signs:  BP 126/69 (BP Location: Left Arm, Patient Position: Sitting, Cuff Size: Normal)   Pulse 91   Ht 5\' 9"  (1.753 m)   Wt 161 lb (73 kg)   BMI 23.78 kg/m    VITAL SIGNS:  reviewed  ASSESSMENT & PLAN:    1. Pedal edema: Unsure whether this is from cardiac etiology.  The last echocardiogram reveals preserved systolic function.  He is due for an echocardiogram now but we have held it off for another month or so because of the virus pandemic.  I am a little worried that giving him diuretics might worsen his dehydration and he is renal insufficiency.  This is something that would be best managed by a nephrologist. 2. His renal stone issues are followed by his urologist 3. At this point I will not make any changes in his  medications.  I also believe that diuretic therapy might drop his blood pressure and lead to adversely. 4. Follow-up appointment in 2 months or earlier if he has any concerns.  He may have this testing done to 8 weeks from now.  We will send a copy of this record to his primary care physician and urologist Dr Nila Nephew.  COVID-19 Education: The signs and symptoms of COVID-19 were discussed with the patient and how to seek care for testing (follow up with PCP or arrange E-visit).  The importance of social distancing was discussed today.  Time:   Today, I have spent 16 minutes with the patient with telehealth technology discussing the above problems.     Medication Adjustments/Labs and Tests Ordered: Current medicines are reviewed at length with the patient today.  Concerns regarding medicines are outlined above.   Tests Ordered: No orders of the defined types were placed in this encounter.   Medication Changes: No orders of the defined types were placed in this encounter.   Disposition:  Follow up in 1 month(s)  Signed, Jenean Lindau, MD  06/15/2018 3:53 PM    [place]

## 2018-06-15 NOTE — Patient Instructions (Signed)
Medication Instructions:  Your physician recommends that you continue on your current medications as directed. Please refer to the Current Medication list given to you today.  If you need a refill on your cardiac medications before your next appointment, please call your pharmacy.   Lab work: NONE If you have labs (blood work) drawn today and your tests are completely normal, you will receive your results only by: Marland Kitchen MyChart Message (if you have MyChart) OR . A paper copy in the mail If you have any lab test that is abnormal or we need to change your treatment, we will call you to review the results.  Testing/Procedures: NONE  Follow-Up: At [facility], you and your health needs are our priority.  As part of our continuing mission to provide you with exceptional heart care, we have created designated Provider Care Teams.  These Care Teams include your primary Cardiologist (physician) and Advanced Practice Providers (APPs -  Physician Assistants and Nurse Practitioners) who all work together to provide you with the care you need, when you need it. You will need a follow up appointment in 8 weeks.

## 2018-06-16 DIAGNOSIS — J440 Chronic obstructive pulmonary disease with acute lower respiratory infection: Secondary | ICD-10-CM | POA: Diagnosis not present

## 2018-06-16 DIAGNOSIS — I110 Hypertensive heart disease with heart failure: Secondary | ICD-10-CM | POA: Diagnosis not present

## 2018-06-17 DIAGNOSIS — F0390 Unspecified dementia without behavioral disturbance: Secondary | ICD-10-CM | POA: Diagnosis not present

## 2018-06-17 DIAGNOSIS — I2510 Atherosclerotic heart disease of native coronary artery without angina pectoris: Secondary | ICD-10-CM | POA: Diagnosis not present

## 2018-06-17 DIAGNOSIS — E538 Deficiency of other specified B group vitamins: Secondary | ICD-10-CM | POA: Diagnosis not present

## 2018-06-17 DIAGNOSIS — S22060A Wedge compression fracture of T7-T8 vertebra, initial encounter for closed fracture: Secondary | ICD-10-CM | POA: Diagnosis not present

## 2018-06-17 DIAGNOSIS — Z6825 Body mass index (BMI) 25.0-25.9, adult: Secondary | ICD-10-CM | POA: Diagnosis not present

## 2018-06-17 DIAGNOSIS — M818 Other osteoporosis without current pathological fracture: Secondary | ICD-10-CM | POA: Diagnosis not present

## 2018-06-17 DIAGNOSIS — E46 Unspecified protein-calorie malnutrition: Secondary | ICD-10-CM | POA: Diagnosis not present

## 2018-06-18 DIAGNOSIS — N200 Calculus of kidney: Secondary | ICD-10-CM | POA: Diagnosis not present

## 2018-06-22 DIAGNOSIS — N135 Crossing vessel and stricture of ureter without hydronephrosis: Secondary | ICD-10-CM | POA: Diagnosis not present

## 2018-06-22 DIAGNOSIS — Z720 Tobacco use: Secondary | ICD-10-CM | POA: Diagnosis not present

## 2018-06-22 DIAGNOSIS — M810 Age-related osteoporosis without current pathological fracture: Secondary | ICD-10-CM | POA: Diagnosis not present

## 2018-06-22 DIAGNOSIS — I10 Essential (primary) hypertension: Secondary | ICD-10-CM | POA: Diagnosis not present

## 2018-06-22 DIAGNOSIS — Z79899 Other long term (current) drug therapy: Secondary | ICD-10-CM | POA: Diagnosis not present

## 2018-06-22 DIAGNOSIS — Z7902 Long term (current) use of antithrombotics/antiplatelets: Secondary | ICD-10-CM | POA: Diagnosis not present

## 2018-06-22 DIAGNOSIS — Z7982 Long term (current) use of aspirin: Secondary | ICD-10-CM | POA: Diagnosis not present

## 2018-06-22 DIAGNOSIS — I252 Old myocardial infarction: Secondary | ICD-10-CM | POA: Diagnosis not present

## 2018-06-22 DIAGNOSIS — E785 Hyperlipidemia, unspecified: Secondary | ICD-10-CM | POA: Diagnosis not present

## 2018-06-22 DIAGNOSIS — Z8673 Personal history of transient ischemic attack (TIA), and cerebral infarction without residual deficits: Secondary | ICD-10-CM | POA: Diagnosis not present

## 2018-06-22 DIAGNOSIS — G629 Polyneuropathy, unspecified: Secondary | ICD-10-CM | POA: Diagnosis not present

## 2018-06-22 DIAGNOSIS — I2510 Atherosclerotic heart disease of native coronary artery without angina pectoris: Secondary | ICD-10-CM | POA: Diagnosis not present

## 2018-06-22 DIAGNOSIS — N200 Calculus of kidney: Secondary | ICD-10-CM | POA: Diagnosis not present

## 2018-06-22 DIAGNOSIS — K219 Gastro-esophageal reflux disease without esophagitis: Secondary | ICD-10-CM | POA: Diagnosis not present

## 2018-06-22 DIAGNOSIS — J449 Chronic obstructive pulmonary disease, unspecified: Secondary | ICD-10-CM | POA: Diagnosis not present

## 2018-06-22 DIAGNOSIS — N201 Calculus of ureter: Secondary | ICD-10-CM | POA: Diagnosis not present

## 2018-06-26 DIAGNOSIS — J4540 Moderate persistent asthma, uncomplicated: Secondary | ICD-10-CM | POA: Diagnosis not present

## 2018-06-28 DIAGNOSIS — N401 Enlarged prostate with lower urinary tract symptoms: Secondary | ICD-10-CM | POA: Diagnosis not present

## 2018-06-28 DIAGNOSIS — N200 Calculus of kidney: Secondary | ICD-10-CM | POA: Diagnosis not present

## 2018-06-29 DIAGNOSIS — N200 Calculus of kidney: Secondary | ICD-10-CM | POA: Diagnosis not present

## 2018-06-29 DIAGNOSIS — R1031 Right lower quadrant pain: Secondary | ICD-10-CM | POA: Diagnosis not present

## 2018-07-03 DIAGNOSIS — G2581 Restless legs syndrome: Secondary | ICD-10-CM | POA: Diagnosis not present

## 2018-07-03 DIAGNOSIS — I5032 Chronic diastolic (congestive) heart failure: Secondary | ICD-10-CM | POA: Diagnosis not present

## 2018-07-03 DIAGNOSIS — I719 Aortic aneurysm of unspecified site, without rupture: Secondary | ICD-10-CM | POA: Diagnosis not present

## 2018-07-03 DIAGNOSIS — J181 Lobar pneumonia, unspecified organism: Secondary | ICD-10-CM | POA: Diagnosis not present

## 2018-07-03 DIAGNOSIS — F329 Major depressive disorder, single episode, unspecified: Secondary | ICD-10-CM | POA: Diagnosis not present

## 2018-07-03 DIAGNOSIS — N200 Calculus of kidney: Secondary | ICD-10-CM | POA: Diagnosis not present

## 2018-07-03 DIAGNOSIS — J329 Chronic sinusitis, unspecified: Secondary | ICD-10-CM | POA: Diagnosis not present

## 2018-07-03 DIAGNOSIS — F0390 Unspecified dementia without behavioral disturbance: Secondary | ICD-10-CM | POA: Diagnosis not present

## 2018-07-03 DIAGNOSIS — E538 Deficiency of other specified B group vitamins: Secondary | ICD-10-CM | POA: Diagnosis not present

## 2018-07-03 DIAGNOSIS — M818 Other osteoporosis without current pathological fracture: Secondary | ICD-10-CM | POA: Diagnosis not present

## 2018-07-03 DIAGNOSIS — G8929 Other chronic pain: Secondary | ICD-10-CM | POA: Diagnosis not present

## 2018-07-03 DIAGNOSIS — G603 Idiopathic progressive neuropathy: Secondary | ICD-10-CM | POA: Diagnosis not present

## 2018-07-03 DIAGNOSIS — J440 Chronic obstructive pulmonary disease with acute lower respiratory infection: Secondary | ICD-10-CM | POA: Diagnosis not present

## 2018-07-03 DIAGNOSIS — M545 Low back pain: Secondary | ICD-10-CM | POA: Diagnosis not present

## 2018-07-03 DIAGNOSIS — I2510 Atherosclerotic heart disease of native coronary artery without angina pectoris: Secondary | ICD-10-CM | POA: Diagnosis not present

## 2018-07-03 DIAGNOSIS — D649 Anemia, unspecified: Secondary | ICD-10-CM | POA: Diagnosis not present

## 2018-07-03 DIAGNOSIS — E785 Hyperlipidemia, unspecified: Secondary | ICD-10-CM | POA: Diagnosis not present

## 2018-07-03 DIAGNOSIS — Z8673 Personal history of transient ischemic attack (TIA), and cerebral infarction without residual deficits: Secondary | ICD-10-CM | POA: Diagnosis not present

## 2018-07-03 DIAGNOSIS — Z7982 Long term (current) use of aspirin: Secondary | ICD-10-CM | POA: Diagnosis not present

## 2018-07-03 DIAGNOSIS — Z7902 Long term (current) use of antithrombotics/antiplatelets: Secondary | ICD-10-CM | POA: Diagnosis not present

## 2018-07-03 DIAGNOSIS — Z9181 History of falling: Secondary | ICD-10-CM | POA: Diagnosis not present

## 2018-07-03 DIAGNOSIS — I110 Hypertensive heart disease with heart failure: Secondary | ICD-10-CM | POA: Diagnosis not present

## 2018-07-06 ENCOUNTER — Ambulatory Visit: Payer: PPO | Admitting: Cardiology

## 2018-07-14 NOTE — Progress Notes (Signed)
Results printed and sent to Dr. Viviann Spare office per Dr. Docia Furl request.

## 2018-07-19 ENCOUNTER — Encounter: Payer: Self-pay | Admitting: Family Medicine

## 2018-07-19 DIAGNOSIS — E785 Hyperlipidemia, unspecified: Secondary | ICD-10-CM | POA: Diagnosis not present

## 2018-07-19 DIAGNOSIS — J449 Chronic obstructive pulmonary disease, unspecified: Secondary | ICD-10-CM | POA: Diagnosis not present

## 2018-07-19 DIAGNOSIS — G2581 Restless legs syndrome: Secondary | ICD-10-CM | POA: Diagnosis not present

## 2018-07-19 DIAGNOSIS — N200 Calculus of kidney: Secondary | ICD-10-CM | POA: Diagnosis not present

## 2018-07-19 DIAGNOSIS — I10 Essential (primary) hypertension: Secondary | ICD-10-CM | POA: Diagnosis not present

## 2018-07-19 DIAGNOSIS — K210 Gastro-esophageal reflux disease with esophagitis: Secondary | ICD-10-CM | POA: Diagnosis not present

## 2018-07-19 DIAGNOSIS — S22060A Wedge compression fracture of T7-T8 vertebra, initial encounter for closed fracture: Secondary | ICD-10-CM | POA: Diagnosis not present

## 2018-07-19 DIAGNOSIS — E538 Deficiency of other specified B group vitamins: Secondary | ICD-10-CM | POA: Diagnosis not present

## 2018-07-19 DIAGNOSIS — R002 Palpitations: Secondary | ICD-10-CM | POA: Diagnosis not present

## 2018-07-19 DIAGNOSIS — R0602 Shortness of breath: Secondary | ICD-10-CM | POA: Diagnosis not present

## 2018-07-19 DIAGNOSIS — D649 Anemia, unspecified: Secondary | ICD-10-CM | POA: Diagnosis not present

## 2018-07-19 DIAGNOSIS — I2510 Atherosclerotic heart disease of native coronary artery without angina pectoris: Secondary | ICD-10-CM | POA: Diagnosis not present

## 2018-07-20 ENCOUNTER — Telehealth: Payer: Self-pay

## 2018-07-20 NOTE — Telephone Encounter (Signed)
We can do a tele-visit as earliest please get copies if they are scanned I am not sure under what section they are.  See if they did an EKG.

## 2018-07-20 NOTE — Telephone Encounter (Signed)
Dr.Gage's nurse called with concerns about patient and feels he needs to be seen for follow up sooner due to his atrial tachycardia. Records were faxed over and scanned to Dr. Docia Furl for review. Information relayed for advisement?

## 2018-07-21 DIAGNOSIS — N401 Enlarged prostate with lower urinary tract symptoms: Secondary | ICD-10-CM | POA: Diagnosis not present

## 2018-07-21 DIAGNOSIS — N200 Calculus of kidney: Secondary | ICD-10-CM | POA: Diagnosis not present

## 2018-07-21 NOTE — Telephone Encounter (Signed)
Left message on home phone and I spoke with patients daughter. Patient needs to schedule vv with Dr. Docia Furl tomorrow to discuss new health concerns.

## 2018-07-22 ENCOUNTER — Encounter: Payer: Self-pay | Admitting: *Deleted

## 2018-07-22 ENCOUNTER — Ambulatory Visit: Payer: PPO | Admitting: Cardiology

## 2018-07-22 DIAGNOSIS — K219 Gastro-esophageal reflux disease without esophagitis: Secondary | ICD-10-CM | POA: Insufficient documentation

## 2018-07-22 DIAGNOSIS — I10 Essential (primary) hypertension: Secondary | ICD-10-CM | POA: Insufficient documentation

## 2018-07-22 DIAGNOSIS — M810 Age-related osteoporosis without current pathological fracture: Secondary | ICD-10-CM | POA: Insufficient documentation

## 2018-07-22 DIAGNOSIS — E538 Deficiency of other specified B group vitamins: Secondary | ICD-10-CM | POA: Insufficient documentation

## 2018-07-22 DIAGNOSIS — J45909 Unspecified asthma, uncomplicated: Secondary | ICD-10-CM | POA: Insufficient documentation

## 2018-07-22 DIAGNOSIS — E7800 Pure hypercholesterolemia, unspecified: Secondary | ICD-10-CM | POA: Insufficient documentation

## 2018-07-22 DIAGNOSIS — I639 Cerebral infarction, unspecified: Secondary | ICD-10-CM | POA: Insufficient documentation

## 2018-07-22 DIAGNOSIS — J449 Chronic obstructive pulmonary disease, unspecified: Secondary | ICD-10-CM | POA: Insufficient documentation

## 2018-07-22 DIAGNOSIS — H3530 Unspecified macular degeneration: Secondary | ICD-10-CM | POA: Insufficient documentation

## 2018-07-22 DIAGNOSIS — G603 Idiopathic progressive neuropathy: Secondary | ICD-10-CM | POA: Insufficient documentation

## 2018-07-22 DIAGNOSIS — K449 Diaphragmatic hernia without obstruction or gangrene: Secondary | ICD-10-CM | POA: Insufficient documentation

## 2018-07-22 DIAGNOSIS — K5790 Diverticulosis of intestine, part unspecified, without perforation or abscess without bleeding: Secondary | ICD-10-CM | POA: Insufficient documentation

## 2018-07-26 ENCOUNTER — Telehealth: Payer: Self-pay | Admitting: Cardiology

## 2018-07-26 ENCOUNTER — Telehealth: Payer: Self-pay

## 2018-07-26 ENCOUNTER — Other Ambulatory Visit: Payer: Self-pay

## 2018-07-26 ENCOUNTER — Telehealth (INDEPENDENT_AMBULATORY_CARE_PROVIDER_SITE_OTHER): Payer: PPO | Admitting: Cardiology

## 2018-07-26 ENCOUNTER — Encounter: Payer: Self-pay | Admitting: Cardiology

## 2018-07-26 VITALS — BP 125/76 | HR 70

## 2018-07-26 DIAGNOSIS — I10 Essential (primary) hypertension: Secondary | ICD-10-CM

## 2018-07-26 DIAGNOSIS — R002 Palpitations: Secondary | ICD-10-CM

## 2018-07-26 DIAGNOSIS — I471 Supraventricular tachycardia: Secondary | ICD-10-CM

## 2018-07-26 DIAGNOSIS — I2510 Atherosclerotic heart disease of native coronary artery without angina pectoris: Secondary | ICD-10-CM | POA: Diagnosis not present

## 2018-07-26 DIAGNOSIS — I4719 Other supraventricular tachycardia: Secondary | ICD-10-CM | POA: Insufficient documentation

## 2018-07-26 HISTORY — DX: Other supraventricular tachycardia: I47.19

## 2018-07-26 HISTORY — DX: Supraventricular tachycardia: I47.1

## 2018-07-26 NOTE — Telephone Encounter (Signed)
Patient came in for 1:40 office appt with Dr. Geraldo Pitter today and had a temp of 99.93F (new) and a small cough. Patient was instructed to return home and call office by Dr. Docia Furl to continue his visit. Mrs. Dollard was given a 14 day event monitor and instructed to have patient hit monitor button at least 3 x's a day whether he feels symptoms are not. RN attempted to contact Dr. Viviann Spare office to relay information from today's encounter.

## 2018-07-26 NOTE — Telephone Encounter (Signed)
Information relayed to Dr.Gages office via fax and RN spoke with [place]. They will try to get patient into office to rule out COVID 19 concerns and will advise Dr.RRR on rescheduling cardiology appt.

## 2018-07-26 NOTE — Telephone Encounter (Signed)
Left message for patient to call for new appt time for Myoview due to no tech in [place] office 07/20/2018. Appt is made just need to inform patient on return call to the office. [facility]

## 2018-07-26 NOTE — Progress Notes (Signed)
Virtual Visit via Telephone Note   This visit type was conducted due to national recommendations for restrictions regarding the COVID-19 Pandemic (e.g. social distancing) in an effort to limit this patient's exposure and mitigate transmission in our community.  Due to his co-morbid illnesses, this patient is at least at moderate risk for complications without adequate follow up.  This format is felt to be most appropriate for this patient at this time.  The patient did not have access to video technology/had technical difficulties with video requiring transitioning to audio format only (telephone).  All issues noted in this document were discussed and addressed.  No physical exam could be performed with this format.  Please refer to the patient's chart for his  consent to telehealth for [facility].   Date:  07/26/2018   ID:  Chris Braun, DOB 01-16-1936, MRN [id number]  Patient Location: Home Provider Location: Home  PCP:  [name]., MD  Cardiologist:  No primary care provider on file.  Electrophysiologist:  None   Evaluation Performed:  Follow-Up Visit  Chief Complaint: Tachycardia  History of Present Illness:    Chris Braun is a 83 y.o. male with past medical history of coronary artery disease, essential hypertension.  The patient came to our office to be seen and when he was screened his temperature was elevated and he was febrile and he mentioned about new cough.  For this reason we decided to do this as a virtual visit.  Also at that time in view of tachycardia evaluation the patient was given a 1 month event monitor and consented to use it. I discussed his case with his wife and his son at extensive length over the phone.  Patient communicated with me through his family.  The wife mentioned to me that he is completely asymptomatic and feeling fine no palpitations dizziness or any symptoms over the past couple of days.  At the time of my evaluation, the patient is alert awake  oriented and in no distress.  She mentions to me that his vital signs are also stable.  The patient does not have symptoms concerning for COVID-19 infection (fever, chills, cough, or new shortness of breath).    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   Osteoporosis    Stroke [facility])    Past Surgical History:  Procedure Laterality Date   CARDIAC CATHETERIZATION  2002,   stents    CATARACT EXTRACTION Left 2016   Colonscopy  2012   CYSTOSCOPY W/ URETERAL STENT PLACEMENT Right 05/30/2018   Procedure: CYSTOSCOPY WITH RETROGRADE PYELOGRAM/URETERAL STENT PLACEMENT;  Surgeon: Irine Seal, MD;  Location: [place];  Service: Urology;  Laterality: Right;   HIP FRACTURE SURGERY  2008   right   HYDROCELE EXCISION  10/12/2014   LITHOTRIPSY     2007, 2009, 2010, 2011, 2012, 2013   LUMBAR LAMINECTOMY/DECOMPRESSION MICRODISCECTOMY  03/15/2012  Procedure: LUMBAR LAMINECTOMY/DECOMPRESSION MICRODISCECTOMY 1 LEVEL;  Surgeon: Elaina Hoops, MD;  Location: [place] [facility]UR[facility];  Service: Neurosurgery;  Laterality: Left;  Left lumbar three-four decompressive lumbar laminectomy, discectomy   MAXIMUM ACCESS (MAS)POSTERIOR LUMBAR INTERBODY FUSION (PLIF) 2 LEVEL N/A 12/08/2012   Procedure: FOR MAXIMUM ACCESS (MAS) POSTERIOR LUMBAR INTERBODY FUSION (PLIF) 2 LEVEL;  Surgeon: Elaina Hoops, MD;  Location: [place] NEUR[facility] ORS;  Service: Neurosurgery;  Laterality: N/A;  FOR MAXIMUM ACCESS (MAS) POSTERIOR  LUMBAR INTERBODY FUSION (PLIF) 2 LEVEL   ROTATOR CUFF REPAIR  1999   bil    SALIVARY STONE REMOVAL  1964   1964   TOTAL HIP ARTHROPLASTY Right 2008   VT study     with Ablation     No outpatient medications have been marked as taking for the 07/26/18 encounter (Telemedicine) with Sylvio Weatherall, Reita Cliche, MD.     Allergies:   Other; Ibuprofen; and Oxycodone   Social History   Tobacco Use   Smoking status: Former Smoker    Packs/day: 1.00    Years: 45.00    Pack years: 45.00    Types: Cigarettes    Last attempt to quit: 02/02/2001    Years since quitting: 17.4   Smokeless tobacco: Current User    Types: Chew  Substance Use Topics   Alcohol use: No   Drug use: No     Family Hx: The patient's family history includes Alzheimer's disease in his sister; Heart disease in his father, mother, sister, sister, and sister; Hemolytic uremic syndrome in his mother; Stroke in his father.  ROS:   Please see the history of present illness.    As mentioned above All other systems reviewed and are negative.   Prior CV studies:   The following studies were reviewed today:  Records reviewed from Dr. Viviann Spare office was done at length including review of the EKG from his office and from [facility] records  Labs/Other Tests and Data Reviewed:    EKG:  EKG done at primary care physician's office reveals atrial tachycardia with a heart rate of 133 and nonspecific ST-T changes  Recent Labs: 10/07/2017: TSH 1.260 05/31/2018: ALT 27; BUN 64; Creatinine, Ser 1.55; Hemoglobin 7.3; Magnesium 1.8; Platelets 200; Potassium 4.2; Sodium 146   Recent Lipid Panel No results found for: CHOL, TRIG, HDL, CHOLHDL, LDLCALC, LDLDIRECT  Wt Readings from Last 3 Encounters:  06/15/18 161 lb (73 kg)  05/31/18 159 lb 9.8 oz (72.4 kg)  04/27/18 163 lb (73.9 kg)     Objective:    Vital Signs:  BP 125/76 (BP Location: Left Arm, Patient Position: Sitting, Cuff Size: Normal)    Pulse 70    VITAL  SIGNS:  reviewed  ASSESSMENT & PLAN:    1. Tachycardia: I discussed my findings with the patient's wife and son at extensive length.  His heart rate currently is well regulated and fine.  I have given him an event monitor to record his heart rate and rhythm on a regular basis.  He was told to activate the button on a regular basis to have a correct assessment of his rhythm and he vocalized understanding.  I told him to get in touch with his primary care doctor about his fever and cough and to be tested.  We tried to call the primary care doctor's office but we were unable to reach them this afternoon.  We will keep in touch with the patient and keep him updated about the findings of the monitoring on  a regular basis and based on the evaluation of his primary care physician we will see him soon in the clinic in follow-up.  Patient knows to go to the nearest emergency room for any concerning symptoms.  His wife mentions to me that he has called his primary care office for a follow-up appointment possibly today for this evaluation of fever and cough.  COVID-19 Education: The signs and symptoms of COVID-19 were discussed with the patient and how to seek care for testing (follow up with PCP or arrange E-visit).  The importance of social distancing was discussed today.  Time:   Today, I have spent 15 minutes with the patient with telehealth technology discussing the above problems.     Medication Adjustments/Labs and Tests Ordered: Current medicines are reviewed at length with the patient today.  Concerns regarding medicines are outlined above.   Tests Ordered: No orders of the defined types were placed in this encounter.   Medication Changes: No orders of the defined types were placed in this encounter.   Disposition:  Follow up after being evaluated by his primary care physician in the next few days. Signed, Jenean Lindau, MD  07/26/2018 3:18 PM    [place]

## 2018-07-27 ENCOUNTER — Other Ambulatory Visit (INDEPENDENT_AMBULATORY_CARE_PROVIDER_SITE_OTHER): Payer: PPO

## 2018-07-27 DIAGNOSIS — J329 Chronic sinusitis, unspecified: Secondary | ICD-10-CM | POA: Diagnosis not present

## 2018-07-27 DIAGNOSIS — J4540 Moderate persistent asthma, uncomplicated: Secondary | ICD-10-CM | POA: Diagnosis not present

## 2018-07-27 DIAGNOSIS — R509 Fever, unspecified: Secondary | ICD-10-CM | POA: Diagnosis not present

## 2018-07-27 DIAGNOSIS — J449 Chronic obstructive pulmonary disease, unspecified: Secondary | ICD-10-CM | POA: Diagnosis not present

## 2018-07-27 DIAGNOSIS — R002 Palpitations: Secondary | ICD-10-CM | POA: Diagnosis not present

## 2018-07-29 NOTE — Telephone Encounter (Signed)
Patient is still not improved according to his wife. He was seen at Dr. Viviann Spare office for fever and malaise on 07/26/18. According to spouse, all was negative. Patient added as an office visit tomorrow to see Dr. Geraldo Pitter.

## 2018-07-30 ENCOUNTER — Encounter: Payer: Self-pay | Admitting: Cardiology

## 2018-07-30 ENCOUNTER — Ambulatory Visit (INDEPENDENT_AMBULATORY_CARE_PROVIDER_SITE_OTHER): Payer: PPO | Admitting: Cardiology

## 2018-07-30 ENCOUNTER — Other Ambulatory Visit: Payer: Self-pay

## 2018-07-30 VITALS — BP 118/62 | HR 75 | Ht 69.0 in | Wt 160.0 lb

## 2018-07-30 DIAGNOSIS — I2510 Atherosclerotic heart disease of native coronary artery without angina pectoris: Secondary | ICD-10-CM

## 2018-07-30 DIAGNOSIS — Z9889 Other specified postprocedural states: Secondary | ICD-10-CM

## 2018-07-30 DIAGNOSIS — Z8679 Personal history of other diseases of the circulatory system: Secondary | ICD-10-CM | POA: Diagnosis not present

## 2018-07-30 DIAGNOSIS — I471 Supraventricular tachycardia: Secondary | ICD-10-CM

## 2018-07-30 DIAGNOSIS — I10 Essential (primary) hypertension: Secondary | ICD-10-CM | POA: Diagnosis not present

## 2018-07-30 NOTE — Progress Notes (Signed)
Cardiology Office Note:    Date:  07/30/2018   ID:  [name], DOB 1935/03/13, MRN [id number]  PCP:  [name]., MD  Cardiologist:  Jenean Lindau, MD   Referring MD: [name]., MD    ASSESSMENT:    1. Essential hypertension   2. Coronary artery disease involving native coronary artery of native heart without angina pectoris   3. Atrial tachycardia ([place])   4. Status post ablation of ventricular arrhythmia    PLAN:    In order of problems listed above:  1. Nonsustained ventricular tachycardia: Currently the patient is doing fine.  There is no history of congestive heart failure.  Last evaluation by echocardiogram was unremarkable.  His lab work is fine including electrolytes and potassium levels.  Patient will undergo echocardiogram to assess left ventricular systolic function and also stress testing to look for any ischemic etiology for this findings.  He also is currently undergoing event monitoring and it has not shown any other arrhythmias.  This was a 11-second nonsustained ventricular tachycardia.  I discussed this with the patient and his wife at extensive length and questions were answered to her satisfaction. 2. Essential hypertension: Blood pressure stable 3. Mixed dyslipidemia: Diet was discussed and patient is on statin and lipids are followed by primary care physician.  This will also help his atherosclerotic process. 4. Patient will be seen in follow-up appointment in 6 months or earlier if the patient has any concerns I reviewed the records from primary care physician notes extensively including investigations such as CT scan of the chest.   Medication Adjustments/Labs and Tests Ordered: Current medicines are reviewed at length with the patient today.  Concerns regarding medicines are outlined above.  No orders of the defined types were placed in this encounter.  No orders of the defined types were placed in this encounter.    No chief complaint on  file.    History of Present Illness:    Chris Braun is a 83 y.o. male.  Patient has past medical history of essential hypertension and dyslipidemia.  Patient has atrial tachycardia.  He recently has had significant health problems including kidney stones.  He has been recently evaluated for the covid test and this was negative.  He denies any chest pain orthopnea or PND.  He leads a sedentary lifestyle.  He is undergoing monitoring and this has revealed nonsustained ventricular tachycardia.  Recent lab work including Chem-7 done at primary care doctor's office was unremarkable.  I also reviewed the CT scan and evaluation of carotid arteries which was unremarkable.  At the time of my evaluation, the patient is alert awake oriented and in no distress.  Past Medical History:  Diagnosis Date  . Asthma   . B12 deficiency   . Cataracts, bilateral   . Compression fracture    BAck  . COPD (chronic obstructive pulmonary disease) ([place])   . Depression   . Diverticulosis    per colonscopy  . Gait abnormality 01/19/2018  . GERD (gastroesophageal reflux disease)   . Hiatal hernia   . High cholesterol   . Hypertension   . Idiopathic progressive polyneuropathy   . Kidney stones   . Macular degeneration   . Memory disorder 01/19/2018  . Myocardial infarction ([place]) 2002  . 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 Osteoporosis   . Stroke [facility])     Past Surgical History:  Procedure Laterality Date  . CARDIAC CATHETERIZATION  2002,  stents   . CATARACT EXTRACTION Left 2016  . Colonscopy  2012  . CYSTOSCOPY W/ URETERAL STENT PLACEMENT Right 05/30/2018   Procedure: CYSTOSCOPY WITH RETROGRADE PYELOGRAM/URETERAL STENT PLACEMENT;  Surgeon: Irine Seal, MD;  Location: [place];  Service: Urology;  Laterality: Right;  . HIP FRACTURE SURGERY  2008   right  . HYDROCELE EXCISION  10/12/2014  . LITHOTRIPSY     2007, 2009, 2010, 2011, 2012, 2013  . LUMBAR LAMINECTOMY/DECOMPRESSION MICRODISCECTOMY   03/15/2012   Procedure: LUMBAR LAMINECTOMY/DECOMPRESSION MICRODISCECTOMY 1 LEVEL;  Surgeon: Elaina Hoops, MD;  Location: [place] NEURO ORS;  Service: Neurosurgery;  Laterality: Left;  Left lumbar three-four decompressive lumbar laminectomy, discectomy  . MAXIMUM ACCESS (MAS)POSTERIOR LUMBAR INTERBODY FUSION (PLIF) 2 LEVEL N/A 12/08/2012   Procedure: FOR MAXIMUM ACCESS (MAS) POSTERIOR LUMBAR INTERBODY FUSION (PLIF) 2 LEVEL;  Surgeon: Elaina Hoops, MD;  Location: [place] NEURO ORS;  Service: Neurosurgery;  Laterality: N/A;  FOR MAXIMUM ACCESS (MAS) POSTERIOR LUMBAR INTERBODY FUSION (PLIF) 2 LEVEL  . ROTATOR CUFF REPAIR  1999   bil   . [place]  . TOTAL HIP ARTHROPLASTY Right 2008  . VT study     with Ablation    Current Medications: Current Meds  Medication Sig  . albuterol (PROVENTIL HFA;VENTOLIN HFA) 108 (90 BASE) MCG/ACT inhaler Inhale 2 puffs into the lungs every 6 (six) hours as needed for wheezing.  Marland Kitchen aspirin EC 81 MG tablet Take 81 mg by mouth daily.  Marland Kitchen atorvastatin (LIPITOR) 40 MG tablet Take 20 mg by mouth daily.   . benzonatate (TESSALON) 200 MG capsule Take 200 mg by mouth at bedtime.   . calcitonin, salmon, (MIACALCIN/FORTICAL) 200 UNIT/ACT nasal spray Place 1 spray into alternate nostrils daily.  . clopidogrel (PLAVIX) 75 MG tablet Take 75 mg by mouth daily.  . Cyanocobalamin (B-12 PO) Take 2,500 mcg by mouth daily.  Marland Kitchen donepezil (ARICEPT) 5 MG tablet Take 5 mg by mouth at bedtime.   Marland Kitchen guaiFENesin (MUCINEX) 600 MG 12 hr tablet Take 600 mg by mouth 2 (two) times daily.  Marland Kitchen HYDROcodone-acetaminophen (NORCO/VICODIN) 5-325 MG tablet Take 1 tablet by mouth every 6 (six) hours as needed for moderate pain.  Marland Kitchen ipratropium-albuterol (DUONEB) 0.5-2.5 (3) MG/3ML SOLN Take 3 mLs by nebulization 4 (four) times daily.  . IRON PO Take 27 mg by mouth daily.  Marland Kitchen lactulose (CHRONULAC) 10 GM/15ML solution Take 10 g by mouth daily as needed for mild constipation.  Marland Kitchen loratadine  (CLARITIN) 10 MG tablet Take 10 mg by mouth.  . Lutein 40 MG CAPS Take 40 mg by mouth daily.  . metoprolol succinate (TOPROL-XL) 25 MG 24 hr tablet Take 12.5 mg by mouth at bedtime.   . montelukast (SINGULAIR) 10 MG tablet Take 10 mg by mouth at bedtime.  . Multiple Vitamins-Minerals (MULTIVITAMIN PO) Take 1 tablet by mouth daily.  . nitroGLYCERIN (NITROSTAT) 0.4 MG SL tablet DISSOLVE 1 TABLET UNDER THE TONGUE EVERY 5 MINUTES AS NEEDED FOR CHEST PAIN. DO NOT EXCEED A TOTAL OF 3 DOSES IN 15 MINUTES.  Marland Kitchen ondansetron (ZOFRAN) 4 MG tablet Take 4 mg by mouth every 8 (eight) hours as needed for nausea or vomiting.  . pantoprazole (PROTONIX) 40 MG tablet Take 40 mg by mouth daily.  . polyethylene glycol (MIRALAX / GLYCOLAX) 17 g packet Take 17 g by mouth daily.  Marland Kitchen rOPINIRole (REQUIP) 0.5 MG tablet Take 0.5 mg by mouth 3 (three) times daily.  . SYMBICORT 160-4.5 MCG/ACT  inhaler Inhale 2 puffs into the lungs 2 (two) times daily.  . tamsulosin (FLOMAX) 0.4 MG CAPS capsule Take 0.4 mg by mouth daily after supper.  . traMADol (ULTRAM) 50 MG tablet Take 50 mg by mouth every 6 (six) hours as needed for moderate pain.      Allergies:   Other, Ibuprofen, and Oxycodone   Social History   Socioeconomic History  . Marital status: Married    Spouse name: Jakylan Ron  . Number of children: 4  . Years of education: 7  . Highest education level: Not on file  Occupational History  . Occupation: Retired  Scientific laboratory technician  . Financial resource strain: Not on file  . Food insecurity    Worry: Not on file    Inability: Not on file  . Transportation needs    Medical: Not on file    Non-medical: Not on file  Tobacco Use  . Smoking status: Former Smoker    Packs/day: 1.00    Years: 45.00    Pack years: 45.00    Types: Cigarettes    Quit date: 02/02/2001    Years since quitting: 17.4  . Smokeless tobacco: Current User    Types: Chew  Substance and Sexual Activity  . Alcohol use: No  . Drug use: No  .  Sexual activity: Not on file  Lifestyle  . Physical activity    Days per week: Not on file    Minutes per session: Not on file  . Stress: Not on file  Relationships  . Social Herbalist on phone: Not on file    Gets together: Not on file    Attends religious service: Not on file    Active member of club or organization: Not on file    Attends meetings of clubs or organizations: Not on file    Relationship status: Not on file  Other Topics Concern  . Not on file  Social History Narrative   Lives with wife   Caffeine use: coffee, tea     Family History: The patient's family history includes Alzheimer's disease in his sister; Heart disease in his father, mother, sister, sister, and sister; Hemolytic uremic syndrome in his mother; Stroke in his father.  ROS:   Please see the history of present illness.    All other systems reviewed and are negative.  EKGs/Labs/Other Studies Reviewed:    The following studies were reviewed today: As mentioned above.  EKG reveals today sinus rhythm nonspecific ST-T changes and prolonged QT   Recent Labs: 10/07/2017: TSH 1.260 05/31/2018: ALT 27; BUN 64; Creatinine, Ser 1.55; Hemoglobin 7.3; Magnesium 1.8; Platelets 200; Potassium 4.2; Sodium 146  Recent Lipid Panel No results found for: CHOL, TRIG, HDL, CHOLHDL, VLDL, LDLCALC, LDLDIRECT  Physical Exam:    VS:  BP 118/62 (BP Location: Left Arm, Patient Position: Sitting, Cuff Size: Normal)   Pulse 75   Ht 5\' 9"  (1.753 m)   Wt 160 lb (72.6 kg)   SpO2 95%   BMI 23.63 kg/m     Wt Readings from Last 3 Encounters:  07/30/18 160 lb (72.6 kg)  06/15/18 161 lb (73 kg)  05/31/18 159 lb 9.8 oz (72.4 kg)     GEN: Patient is in no acute distress HEENT: Normal NECK: No JVD; No carotid bruits LYMPHATICS: No lymphadenopathy CARDIAC: Hear sounds regular, 2/6 systolic murmur at the apex. RESPIRATORY:  Clear to auscultation without rales, wheezing or rhonchi  ABDOMEN: Soft, non-tender,  non-distended MUSCULOSKELETAL:  No edema; No deformity  SKIN: Warm and dry NEUROLOGIC:  Alert and oriented x 3 PSYCHIATRIC:  Normal affect   Signed, Jenean Lindau, MD  07/30/2018 2:14 PM    [facility]

## 2018-07-30 NOTE — Patient Instructions (Signed)

## 2018-08-16 ENCOUNTER — Telehealth: Payer: PPO | Admitting: Cardiology

## 2018-08-24 ENCOUNTER — Telehealth: Payer: Self-pay | Admitting: *Deleted

## 2018-08-24 NOTE — Telephone Encounter (Signed)
Patient given detailed instructions per Myocardial Perfusion Study Information Sheet for the test on 08/25/18. Patient notified to arrive 15 minutes early and that it is imperative to arrive on time for appointment to keep from having the test rescheduled.  If you need to cancel or reschedule your appointment, please call the office within 24 hours of your appointment. . Patient verbalized understanding. Chris Braun    

## 2018-08-25 ENCOUNTER — Ambulatory Visit (INDEPENDENT_AMBULATORY_CARE_PROVIDER_SITE_OTHER): Payer: PPO

## 2018-08-25 ENCOUNTER — Other Ambulatory Visit: Payer: Self-pay

## 2018-08-25 DIAGNOSIS — I639 Cerebral infarction, unspecified: Secondary | ICD-10-CM | POA: Diagnosis not present

## 2018-08-25 DIAGNOSIS — Z9889 Other specified postprocedural states: Secondary | ICD-10-CM

## 2018-08-25 DIAGNOSIS — I2510 Atherosclerotic heart disease of native coronary artery without angina pectoris: Secondary | ICD-10-CM

## 2018-08-25 DIAGNOSIS — E785 Hyperlipidemia, unspecified: Secondary | ICD-10-CM

## 2018-08-25 DIAGNOSIS — Z8679 Personal history of other diseases of the circulatory system: Secondary | ICD-10-CM | POA: Diagnosis not present

## 2018-08-25 LAB — MYOCARDIAL PERFUSION IMAGING
LV dias vol: 76 mL (ref 62–150)
LVSYSVOL: 33 mL
Peak HR: 85 {beats}/min
Rest HR: 58 {beats}/min
SDS: 3
SRS: 7
SSS: 10
TID: 0.85

## 2018-08-25 MED ORDER — REGADENOSON 0.4 MG/5ML IV SOLN
0.4000 mg | Freq: Once | INTRAVENOUS | Status: AC
  Administered 2018-08-25: 0.4 mg via INTRAVENOUS

## 2018-08-25 MED ORDER — TECHNETIUM TC 99M TETROFOSMIN IV KIT
10.5000 | PACK | Freq: Once | INTRAVENOUS | Status: AC | PRN
  Administered 2018-08-25: 10.5 via INTRAVENOUS

## 2018-08-25 MED ORDER — TECHNETIUM TC 99M TETROFOSMIN IV KIT
32.7000 | PACK | Freq: Once | INTRAVENOUS | Status: AC | PRN
  Administered 2018-08-25: 32.7 via INTRAVENOUS

## 2018-08-26 DIAGNOSIS — J4540 Moderate persistent asthma, uncomplicated: Secondary | ICD-10-CM | POA: Diagnosis not present

## 2018-08-30 DIAGNOSIS — Z6825 Body mass index (BMI) 25.0-25.9, adult: Secondary | ICD-10-CM | POA: Diagnosis not present

## 2018-08-30 DIAGNOSIS — D649 Anemia, unspecified: Secondary | ICD-10-CM | POA: Diagnosis not present

## 2018-08-30 DIAGNOSIS — K210 Gastro-esophageal reflux disease with esophagitis: Secondary | ICD-10-CM | POA: Diagnosis not present

## 2018-08-30 DIAGNOSIS — M542 Cervicalgia: Secondary | ICD-10-CM | POA: Diagnosis not present

## 2018-08-30 DIAGNOSIS — E785 Hyperlipidemia, unspecified: Secondary | ICD-10-CM | POA: Diagnosis not present

## 2018-08-30 DIAGNOSIS — J449 Chronic obstructive pulmonary disease, unspecified: Secondary | ICD-10-CM | POA: Diagnosis not present

## 2018-08-30 DIAGNOSIS — M818 Other osteoporosis without current pathological fracture: Secondary | ICD-10-CM | POA: Diagnosis not present

## 2018-08-30 DIAGNOSIS — N400 Enlarged prostate without lower urinary tract symptoms: Secondary | ICD-10-CM | POA: Diagnosis not present

## 2018-08-30 DIAGNOSIS — F0390 Unspecified dementia without behavioral disturbance: Secondary | ICD-10-CM | POA: Diagnosis not present

## 2018-08-30 DIAGNOSIS — I2510 Atherosclerotic heart disease of native coronary artery without angina pectoris: Secondary | ICD-10-CM | POA: Diagnosis not present

## 2018-09-02 ENCOUNTER — Telehealth: Payer: Self-pay

## 2018-09-02 NOTE — Telephone Encounter (Signed)
-----   Message from Jenean Lindau, MD sent at 08/25/2018  4:52 PM EDT ----- The results of the study is unremarkable. Please inform patient. I will discuss in detail at next appointment. Cc  primary care/referring physician Jenean Lindau, MD 08/25/2018 4:52 PM

## 2018-09-02 NOTE — Telephone Encounter (Signed)
Patient called and notified of test results. 

## 2018-09-03 ENCOUNTER — Other Ambulatory Visit: Payer: Self-pay

## 2018-09-03 ENCOUNTER — Ambulatory Visit (INDEPENDENT_AMBULATORY_CARE_PROVIDER_SITE_OTHER): Payer: PPO

## 2018-09-03 DIAGNOSIS — E785 Hyperlipidemia, unspecified: Secondary | ICD-10-CM | POA: Diagnosis not present

## 2018-09-03 DIAGNOSIS — I639 Cerebral infarction, unspecified: Secondary | ICD-10-CM | POA: Diagnosis not present

## 2018-09-03 DIAGNOSIS — I2510 Atherosclerotic heart disease of native coronary artery without angina pectoris: Secondary | ICD-10-CM | POA: Diagnosis not present

## 2018-09-03 NOTE — Progress Notes (Signed)
Complete echocardiogram has been performed.  Jimmy Mashonda Broski RDCS, RVT 

## 2018-09-07 ENCOUNTER — Telehealth: Payer: Self-pay

## 2018-09-07 NOTE — Telephone Encounter (Signed)
Left message for patient to call back for results.  

## 2018-09-07 NOTE — Telephone Encounter (Signed)
-----   Message from Jenean Lindau, MD sent at 09/06/2018 11:46 AM EDT ----- The results of the study is unremarkable.  Patient has wall motion abnormalities but no changes in treatment at this time.  Please inform patient. I will discuss in detail at next appointment. Cc  primary care/referring physician Jenean Lindau, MD 09/06/2018 11:45 AM

## 2018-09-07 NOTE — Telephone Encounter (Signed)
Information relayed to wife, no further questions at this time. Copy sent to Dr. Normajean Glasgow office per Dr. Docia Furl request.

## 2018-09-09 ENCOUNTER — Telehealth: Payer: Self-pay | Admitting: *Deleted

## 2018-09-09 NOTE — Telephone Encounter (Signed)
Pt dropped monitor off here but there was no name on it so didn't know who it belonged to. Got a form from [employer] about monitor that had been registered but never received. Matched up the serial number with one that was left here. Mailed back monitor on 7/22 and alerted [employer] that monitor is being returned.

## 2018-09-20 ENCOUNTER — Ambulatory Visit: Payer: PPO | Admitting: Neurology

## 2018-09-22 ENCOUNTER — Encounter

## 2018-09-26 DIAGNOSIS — J4540 Moderate persistent asthma, uncomplicated: Secondary | ICD-10-CM | POA: Diagnosis not present

## 2018-10-07 ENCOUNTER — Telehealth: Payer: Self-pay

## 2018-10-07 DIAGNOSIS — I471 Supraventricular tachycardia: Secondary | ICD-10-CM

## 2018-10-07 NOTE — Telephone Encounter (Signed)
Forwarding to scheduler to arrange Consult for ATach/NSVT.

## 2018-10-07 NOTE — Telephone Encounter (Signed)
-----   Message from Jenean Lindau, MD sent at 09/24/2018  5:10 PM EDT ----- Ep referral  Jenean Lindau, MD 09/24/2018 5:09 PM

## 2018-10-07 NOTE — Telephone Encounter (Signed)
Information relayed to pt daughter, referral placed. S.Price cc'd on message.

## 2018-10-19 DIAGNOSIS — J449 Chronic obstructive pulmonary disease, unspecified: Secondary | ICD-10-CM | POA: Diagnosis not present

## 2018-10-19 DIAGNOSIS — E785 Hyperlipidemia, unspecified: Secondary | ICD-10-CM | POA: Diagnosis not present

## 2018-10-19 DIAGNOSIS — D649 Anemia, unspecified: Secondary | ICD-10-CM | POA: Diagnosis not present

## 2018-10-19 DIAGNOSIS — F0390 Unspecified dementia without behavioral disturbance: Secondary | ICD-10-CM | POA: Diagnosis not present

## 2018-10-19 DIAGNOSIS — K210 Gastro-esophageal reflux disease with esophagitis: Secondary | ICD-10-CM | POA: Diagnosis not present

## 2018-10-19 DIAGNOSIS — E538 Deficiency of other specified B group vitamins: Secondary | ICD-10-CM | POA: Diagnosis not present

## 2018-10-19 DIAGNOSIS — N400 Enlarged prostate without lower urinary tract symptoms: Secondary | ICD-10-CM | POA: Diagnosis not present

## 2018-10-19 DIAGNOSIS — G2581 Restless legs syndrome: Secondary | ICD-10-CM | POA: Diagnosis not present

## 2018-10-19 DIAGNOSIS — I2510 Atherosclerotic heart disease of native coronary artery without angina pectoris: Secondary | ICD-10-CM | POA: Diagnosis not present

## 2018-10-19 DIAGNOSIS — N200 Calculus of kidney: Secondary | ICD-10-CM | POA: Diagnosis not present

## 2018-10-19 DIAGNOSIS — I472 Ventricular tachycardia: Secondary | ICD-10-CM | POA: Diagnosis not present

## 2018-10-19 DIAGNOSIS — I10 Essential (primary) hypertension: Secondary | ICD-10-CM | POA: Diagnosis not present

## 2018-10-27 DIAGNOSIS — J4540 Moderate persistent asthma, uncomplicated: Secondary | ICD-10-CM | POA: Diagnosis not present

## 2018-11-10 DIAGNOSIS — N400 Enlarged prostate without lower urinary tract symptoms: Secondary | ICD-10-CM | POA: Diagnosis not present

## 2018-11-10 DIAGNOSIS — J449 Chronic obstructive pulmonary disease, unspecified: Secondary | ICD-10-CM | POA: Diagnosis not present

## 2018-11-10 DIAGNOSIS — J069 Acute upper respiratory infection, unspecified: Secondary | ICD-10-CM | POA: Diagnosis not present

## 2018-11-10 DIAGNOSIS — R05 Cough: Secondary | ICD-10-CM | POA: Diagnosis not present

## 2018-11-10 DIAGNOSIS — J329 Chronic sinusitis, unspecified: Secondary | ICD-10-CM | POA: Diagnosis not present

## 2018-11-10 DIAGNOSIS — J301 Allergic rhinitis due to pollen: Secondary | ICD-10-CM | POA: Diagnosis not present

## 2018-11-15 ENCOUNTER — Encounter: Payer: Self-pay | Admitting: Cardiology

## 2018-11-15 ENCOUNTER — Ambulatory Visit (INDEPENDENT_AMBULATORY_CARE_PROVIDER_SITE_OTHER): Payer: PPO | Admitting: Cardiology

## 2018-11-15 ENCOUNTER — Other Ambulatory Visit: Payer: Self-pay

## 2018-11-15 VITALS — BP 124/64 | HR 88 | Ht 67.0 in | Wt 162.2 lb

## 2018-11-15 DIAGNOSIS — I472 Ventricular tachycardia, unspecified: Secondary | ICD-10-CM

## 2018-11-15 MED ORDER — METOPROLOL SUCCINATE ER 50 MG PO TB24: 50.0000 mg | ORAL_TABLET | Freq: Every day | ORAL | 2 refills | Status: DC

## 2018-11-15 NOTE — Patient Instructions (Addendum)
Medication Instructions:  Your physician has recommended you make the following change in your medication:  1. INCREASE Toprol to 50 mg once daily  * If you need a refill on your cardiac medications before your next appointment, please call your pharmacy.   Labwork: None ordered  Testing/Procedures: None ordered  Follow-Up: Your physician wants you to follow-up in: 6 months with Dr. Curt Bears.  You will receive a reminder letter in the mail two months in advance. If you don't receive a letter, please call our office to schedule the follow-up appointment.  Thank you for choosing [facility]!!   Trinidad Curet, RN [phone]

## 2018-11-15 NOTE — Progress Notes (Signed)
Electrophysiology Office Note   Date:  11/15/2018   ID:  ZEE VILL, DOB Sep 21, 1935, MRN [id number]  PCP:  [name]., MD  Cardiologist:  Revankar Primary Electrophysiologist:   Will Meredith Leeds, MD    Chief Complaint: NSVT   History of Present Illness: Chris Braun is a 83 y.o. male who is being seen today for the evaluation of VT at the request of Revankar, Reita Cliche, MD. Presenting today for electrophysiology evaluation.  She has a history significant for hypertension, hyperlipidemia, CVA, stroke.  She wore a cardiac monitor that showed episodes of nonsustained VT.  She has no history of heart failure.  Electrolytes were checked without any major abnormality.  He states that the monitor was ordered as his primary physician noted an abnormality during auscultation.  Today, he denies symptoms of palpitations, chest pain, shortness of breath, orthopnea, PND, lower extremity edema, claudication, dizziness, presyncope, syncope, bleeding, or neurologic sequela. The patient is tolerating medications without difficulties.    Past Medical History:  Diagnosis Date  . Asthma   . B12 deficiency   . Cataracts, bilateral   . Compression fracture    BAck  . COPD (chronic obstructive pulmonary disease) ([place])   . Depression   . Diverticulosis    per colonscopy  . Gait abnormality 01/19/2018  . GERD (gastroesophageal reflux disease)   . Hiatal hernia   . High cholesterol   . Hypertension   . Idiopathic progressive polyneuropathy   . Kidney stones   . Macular degeneration   . Memory disorder 01/19/2018  . Myocardial infarction ([place]) 2002  . 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 Osteoporosis   . Stroke [facility])    Past Surgical History:  Procedure Laterality Date  . CARDIAC CATHETERIZATION  2002,   stents   . CATARACT EXTRACTION Left 2016  . Colonscopy  2012  . CYSTOSCOPY W/ URETERAL STENT PLACEMENT Right 05/30/2018   Procedure: CYSTOSCOPY WITH RETROGRADE  PYELOGRAM/URETERAL STENT PLACEMENT;  Surgeon: Irine Seal, MD;  Location: [place];  Service: Urology;  Laterality: Right;  . HIP FRACTURE SURGERY  2008   right  . HYDROCELE EXCISION  10/12/2014  . LITHOTRIPSY     2007, 2009, 2010, 2011, 2012, 2013  . LUMBAR LAMINECTOMY/DECOMPRESSION MICRODISCECTOMY  03/15/2012   Procedure: LUMBAR LAMINECTOMY/DECOMPRESSION MICRODISCECTOMY 1 LEVEL;  Surgeon: Elaina Hoops, MD;  Location: [place] NEURO ORS;  Service: Neurosurgery;  Laterality: Left;  Left lumbar three-four decompressive lumbar laminectomy, discectomy  . MAXIMUM ACCESS (MAS)POSTERIOR LUMBAR INTERBODY FUSION (PLIF) 2 LEVEL N/A 12/08/2012   Procedure: FOR MAXIMUM ACCESS (MAS) POSTERIOR LUMBAR INTERBODY FUSION (PLIF) 2 LEVEL;  Surgeon: Elaina Hoops, MD;  Location: [place] NEURO ORS;  Service: Neurosurgery;  Laterality: N/A;  FOR MAXIMUM ACCESS (MAS) POSTERIOR LUMBAR INTERBODY FUSION (PLIF) 2 LEVEL  . ROTATOR CUFF REPAIR  1999   bil   . [place]  . TOTAL HIP ARTHROPLASTY Right 2008  . VT study     with Ablation     Current Outpatient Medications  Medication Sig Dispense Refill  . Acetaminophen (TYLENOL PO) Take by mouth as directed.    Marland Kitchen albuterol (PROVENTIL HFA;VENTOLIN HFA) 108 (90 BASE) MCG/ACT inhaler Inhale 2 puffs into the lungs every 6 (six) hours as needed for wheezing.    Marland Kitchen aspirin EC 81 MG tablet Take 81 mg by mouth daily.    Marland Kitchen atorvastatin (LIPITOR) 40 MG tablet Take 20 mg by mouth daily.     Marland Kitchen  benzonatate (TESSALON) 200 MG capsule Take 200 mg by mouth at bedtime.     . calcitonin, salmon, (MIACALCIN/FORTICAL) 200 UNIT/ACT nasal spray Place 1 spray into alternate nostrils daily.    . clopidogrel (PLAVIX) 75 MG tablet Take 75 mg by mouth daily.    . clotrimazole (MYCELEX) 10 MG troche Take 10 mg by mouth as directed.    . Cyanocobalamin (B-12 PO) Take 2,500 mcg by mouth daily.    Marland Kitchen donepezil (ARICEPT) 5 MG tablet Take 5 mg by mouth at bedtime.     Marland Kitchen guaiFENesin (MUCINEX)  600 MG 12 hr tablet Take 600 mg by mouth 2 (two) times daily.    Marland Kitchen HYDROcodone-acetaminophen (NORCO/VICODIN) 5-325 MG tablet Take 1 tablet by mouth every 6 (six) hours as needed for moderate pain.    Marland Kitchen ipratropium-albuterol (DUONEB) 0.5-2.5 (3) MG/3ML SOLN Take 3 mLs by nebulization 4 (four) times daily.    . IRON PO Take 27 mg by mouth daily.    Marland Kitchen lactulose (CHRONULAC) 10 GM/15ML solution Take 10 g by mouth daily as needed for mild constipation.    Marland Kitchen loratadine (CLARITIN) 10 MG tablet Take 10 mg by mouth.    . Lutein 40 MG CAPS Take 40 mg by mouth daily.    . montelukast (SINGULAIR) 10 MG tablet Take 10 mg by mouth at bedtime.    . Multiple Vitamins-Minerals (MULTIVITAMIN PO) Take 1 tablet by mouth daily.    . nitroGLYCERIN (NITROSTAT) 0.4 MG SL tablet DISSOLVE 1 TABLET UNDER THE TONGUE EVERY 5 MINUTES AS NEEDED FOR CHEST PAIN. DO NOT EXCEED A TOTAL OF 3 DOSES IN 15 MINUTES. 25 tablet 6  . ondansetron (ZOFRAN) 4 MG tablet Take 4 mg by mouth every 8 (eight) hours as needed for nausea or vomiting.    . pantoprazole (PROTONIX) 40 MG tablet Take 40 mg by mouth daily.    . polyethylene glycol (MIRALAX / GLYCOLAX) 17 g packet Take 17 g by mouth daily.    Marland Kitchen rOPINIRole (REQUIP) 0.5 MG tablet Take 0.5 mg by mouth 3 (three) times daily.  3  . SYMBICORT 160-4.5 MCG/ACT inhaler Inhale 2 puffs into the lungs 2 (two) times daily.  12  . tamsulosin (FLOMAX) 0.4 MG CAPS capsule Take 0.4 mg by mouth daily after supper.    . traMADol (ULTRAM) 50 MG tablet Take 50 mg by mouth every 6 (six) hours as needed for moderate pain.     Marland Kitchen VITAMIN D PO Take by mouth daily.    . metoprolol succinate (TOPROL-XL) 50 MG 24 hr tablet Take 1 tablet (50 mg total) by mouth daily. Take with or immediately following a meal. 90 tablet 2   No current facility-administered medications for this visit.     Allergies:   Other, Ibuprofen, and Oxycodone   Social History:  The patient  reports that he quit smoking about 17 years ago. His  smoking use included cigarettes. He has a 45.00 pack-year smoking history. His smokeless tobacco use includes chew. He reports that he does not drink alcohol or use drugs.   Family History:  The patient's family history includes Alzheimer's disease in his sister; Heart disease in his father, mother, sister, sister, and sister; Hemolytic uremic syndrome in his mother; Stroke in his father.    ROS:  Please see the history of present illness.   Otherwise, review of systems is positive for none.   All other systems are reviewed and negative.    PHYSICAL EXAM: VS:  BP 124/64  Pulse 88   Ht 5\' 7"  (1.702 m)   Wt 162 lb 3.2 oz (73.6 kg)   BMI 25.40 kg/m  , BMI Body mass index is 25.4 kg/m. GEN: Well nourished, well developed, in no acute distress  HEENT: normal  Neck: no JVD, carotid bruits, or masses Cardiac: RRR; no murmurs, rubs, or gallops,no edema  Respiratory:  clear to auscultation bilaterally, normal work of breathing GI: soft, nontender, nondistended, + BS MS: no deformity or atrophy  Skin: warm and dry Neuro:  Strength and sensation are intact Psych: euthymic mood, full affect  EKG:  EKG is ordered today. Personal review of the ekg ordered shows sinus rhythm, rate 90  Recent Labs: 05/31/2018: ALT 27; BUN 64; Creatinine, Ser 1.55; Hemoglobin 7.3; Magnesium 1.8; Platelets 200; Potassium 4.2; Sodium 146    Lipid Panel  No results found for: CHOL, TRIG, HDL, CHOLHDL, VLDL, LDLCALC, LDLDIRECT   Wt Readings from Last 3 Encounters:  11/15/18 162 lb 3.2 oz (73.6 kg)  08/25/18 160 lb (72.6 kg)  07/30/18 160 lb (72.6 kg)      Other studies Reviewed: Additional studies/ records that were reviewed today include: TTE 09/03/18  Review of the above records today demonstrates:   1. Basal and mid inferolateral wall, basal inferoseptal segment, and basal inferior segment are abnormal.  2. The left ventricle has low normal systolic function, with an ejection fraction of 50-55%. The  cavity size was normal. Left ventricular diastolic Doppler parameters are consistent with impaired relaxation.  3. The right ventricle has normal systolic function. The cavity was normal. There is no increase in right ventricular wall thickness.  4. Mild thickening of the mitral valve leaflet. There is mild mitral annular calcification present. No evidence of mitral valve stenosis.  5. The aortic valve is tricuspid. Mild thickening of the aortic valve. No stenosis of the aortic valve.  6. The aortic root, ascending aorta and aortic arch are normal in size and structure.  Myoview 08/25/18  Nuclear stress EF: 57%.  The left ventricular ejection fraction is normal (55-65%).  There was no ST segment deviation noted during stress.  The study is normal.  This is a low risk study.  [place] 09/24/18 personally reviewed Minimum heart rate: 40 BPM.  Average heart rate: 72 BPM.  Maximal heart rate 104 BPM. Atrial arrhythmia: Patient had multiple narrow complex tachycardias the longest of which lasted 3 hours approximately entricular arrhythmia: Multiple episodes of nonsustained ventricular tachycardia the longest lasted 11.9 seconds   ASSESSMENT AND PLAN:  1.  Nonsustained VT: Patient had a VT ablation at [facility] in 2008.  He is continued to have short episodes of VT as noted on his cardiac monitor.  He has been minimally symptomatic from this.  Due to that, we will increase his Toprol-XL to 50 mg daily.  I will work to get the records from his ablation.  2.  Coronary artery disease: MI with catheterization and stenting in 2003.  No chest pain.  3.  Atrial tachycardia: Plan to increase Toprol-XL.   Current medicines are reviewed at length with the patient today.   The patient does not have concerns regarding his medicines.  The following changes were made today: Increase Toprol-XL  Labs/ tests ordered today include:  No orders of the defined types were placed in this encounter.  Case discussed  with referring cardiologist  Disposition:   FU with Will Camnitz 6 months  Signed, Will Meredith Leeds, MD  11/15/2018 11:39 AM  [place] [address] [phone] (office) [phone] (fax)

## 2018-11-26 DIAGNOSIS — J4540 Moderate persistent asthma, uncomplicated: Secondary | ICD-10-CM | POA: Diagnosis not present

## 2018-12-09 NOTE — Addendum Note (Signed)
Addended by: Stanton Kidney on: 12/09/2018 08:44 AM   Modules accepted: Orders

## 2018-12-24 DIAGNOSIS — J449 Chronic obstructive pulmonary disease, unspecified: Secondary | ICD-10-CM | POA: Diagnosis not present

## 2018-12-24 DIAGNOSIS — J329 Chronic sinusitis, unspecified: Secondary | ICD-10-CM | POA: Diagnosis not present

## 2018-12-24 DIAGNOSIS — J069 Acute upper respiratory infection, unspecified: Secondary | ICD-10-CM | POA: Diagnosis not present

## 2018-12-24 DIAGNOSIS — J301 Allergic rhinitis due to pollen: Secondary | ICD-10-CM | POA: Diagnosis not present

## 2018-12-24 DIAGNOSIS — R05 Cough: Secondary | ICD-10-CM | POA: Diagnosis not present

## 2018-12-27 DIAGNOSIS — J4540 Moderate persistent asthma, uncomplicated: Secondary | ICD-10-CM | POA: Diagnosis not present

## 2019-01-04 DIAGNOSIS — J449 Chronic obstructive pulmonary disease, unspecified: Secondary | ICD-10-CM | POA: Diagnosis not present

## 2019-01-04 DIAGNOSIS — B349 Viral infection, unspecified: Secondary | ICD-10-CM | POA: Diagnosis not present

## 2019-01-04 DIAGNOSIS — R05 Cough: Secondary | ICD-10-CM | POA: Diagnosis not present

## 2019-01-04 DIAGNOSIS — J301 Allergic rhinitis due to pollen: Secondary | ICD-10-CM | POA: Diagnosis not present

## 2019-01-04 DIAGNOSIS — J069 Acute upper respiratory infection, unspecified: Secondary | ICD-10-CM | POA: Diagnosis not present

## 2019-01-26 DIAGNOSIS — J4540 Moderate persistent asthma, uncomplicated: Secondary | ICD-10-CM | POA: Diagnosis not present

## 2019-01-28 ENCOUNTER — Ambulatory Visit (INDEPENDENT_AMBULATORY_CARE_PROVIDER_SITE_OTHER): Payer: PPO | Admitting: Cardiology

## 2019-01-28 ENCOUNTER — Encounter: Payer: Self-pay | Admitting: Cardiology

## 2019-01-28 ENCOUNTER — Other Ambulatory Visit: Payer: Self-pay

## 2019-01-28 VITALS — BP 120/78 | HR 62 | Ht 67.0 in | Wt 172.8 lb

## 2019-01-28 DIAGNOSIS — I471 Supraventricular tachycardia: Secondary | ICD-10-CM

## 2019-01-28 DIAGNOSIS — I2510 Atherosclerotic heart disease of native coronary artery without angina pectoris: Secondary | ICD-10-CM

## 2019-01-28 DIAGNOSIS — Z9889 Other specified postprocedural states: Secondary | ICD-10-CM | POA: Diagnosis not present

## 2019-01-28 DIAGNOSIS — E785 Hyperlipidemia, unspecified: Secondary | ICD-10-CM

## 2019-01-28 DIAGNOSIS — Z8679 Personal history of other diseases of the circulatory system: Secondary | ICD-10-CM

## 2019-01-28 NOTE — Addendum Note (Signed)
Addended by: Ashok Norris on: 01/28/2019 03:27 PM   Modules accepted: Orders

## 2019-01-28 NOTE — Patient Instructions (Signed)
Medication Instructions:  Your physician recommends that you continue on your current medications as directed. Please refer to the Current Medication list given to you today.  *If you need a refill on your cardiac medications before your next appointment, please call your pharmacy*  Lab Work: Your physician recommends that you return for lab work today: bmp   If you have labs (blood work) drawn today and your tests are completely normal, you will receive your results only by: Marland Kitchen MyChart Message (if you have MyChart) OR . A paper copy in the mail If you have any lab test that is abnormal or we need to change your treatment, we will call you to review the results.  Testing/Procedures: None.   Follow-Up: At [facility], you and your health needs are our priority.  As part of our continuing mission to provide you with exceptional heart care, we have created designated Provider Care Teams.  These Care Teams include your primary Cardiologist (physician) and Advanced Practice Providers (APPs -  Physician Assistants and Nurse Practitioners) who all work together to provide you with the care you need, when you need it.  Your next appointment:   6 month(s)  The format for your next appointment:   In Person  Provider:   Jyl Heinz, MD  Other Instructions '

## 2019-01-28 NOTE — Progress Notes (Signed)
Cardiology Office Note:    Date:  01/28/2019   ID:  MAJED COLGROVE, DOB 04/11/1935, MRN [id number]  PCP:  [name]., MD  Cardiologist:  Jenean Lindau, MD   Referring MD: [name]., MD    ASSESSMENT:    No diagnosis found. PLAN:    In order of problems listed above:  1. Nonsustained ventricular tachycardia: I reviewed our electrophysiology colleagues notes and medical management and increase in beta-blocker therapy.  I discussed this with the patient at length and they are happy about it.  In view of the fact that the patient is on multiple medications I will do a Chem-7 today. 2. Essential hypertension: Blood pressure stable 3. Mixed dyslipidemia: Managed by primary care physician and had blood work recently 40. Coronary artery disease: Secondary prevention stressed.  Importance of compliance with diet and medication stressed and he vocalized understanding. 5. Patient will be seen in follow-up appointment in 6 months or earlier if the patient has any concerns    Medication Adjustments/Labs and Tests Ordered: Current medicines are reviewed at length with the patient today.  Concerns regarding medicines are outlined above.  No orders of the defined types were placed in this encounter.  No orders of the defined types were placed in this encounter.    Chief Complaint  Patient presents with  . Follow-up     History of Present Illness:    Chris Braun is a 83 y.o. male.  Patient has past medical history of coronary artery disease, essential hypertension, dyslipidemia and history of ventricular tachycardia post ablation.  Monitoring revealed nonsustained ventricular tachycardia and he was seen evaluated by electrophysiology colleagues.  Medical management was appropriately prescribed.  Patient's beta-blocker was increased and he is tolerating it well.  No chest pain orthopnea PND palpitations dizziness or any syncopal spell.  At the time of my evaluation, the patient is  alert awake oriented and in no distress.  Past Medical History:  Diagnosis Date  . Asthma   . B12 deficiency   . Cataracts, bilateral   . Compression fracture    BAck  . COPD (chronic obstructive pulmonary disease) ([place])   . Depression   . Diverticulosis    per colonscopy  . Gait abnormality 01/19/2018  . GERD (gastroesophageal reflux disease)   . Hiatal hernia   . High cholesterol   . Hypertension   . Idiopathic progressive polyneuropathy   . Kidney stones   . Macular degeneration   . Memory disorder 01/19/2018  . Myocardial infarction ([place]) 2002  . 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 Osteoporosis   . Stroke [facility])     Past Surgical History:  Procedure Laterality Date  . CARDIAC CATHETERIZATION  2002,   stents   . CATARACT EXTRACTION Left 2016  . Colonscopy  2012  . CYSTOSCOPY W/ URETERAL STENT PLACEMENT Right 05/30/2018   Procedure: CYSTOSCOPY WITH RETROGRADE PYELOGRAM/URETERAL STENT PLACEMENT;  Surgeon: Irine Seal, MD;  Location: [place];  Service: Urology;  Laterality: Right;  . HIP FRACTURE SURGERY  2008   right  . HYDROCELE EXCISION  10/12/2014  . LITHOTRIPSY     2007, 2009, 2010, 2011, 2012, 2013  . LUMBAR LAMINECTOMY/DECOMPRESSION MICRODISCECTOMY  03/15/2012   Procedure: LUMBAR LAMINECTOMY/DECOMPRESSION MICRODISCECTOMY 1 LEVEL;  Surgeon: Elaina Hoops, MD;  Location: [place] NEURO ORS;  Service: Neurosurgery;  Laterality: Left;  Left lumbar three-four decompressive lumbar laminectomy, discectomy  . MAXIMUM ACCESS (MAS)POSTERIOR LUMBAR INTERBODY FUSION (PLIF) 2 LEVEL N/A 12/08/2012  Procedure: FOR MAXIMUM ACCESS (MAS) POSTERIOR LUMBAR INTERBODY FUSION (PLIF) 2 LEVEL;  Surgeon: Elaina Hoops, MD;  Location: [place] NEURO ORS;  Service: Neurosurgery;  Laterality: N/A;  FOR MAXIMUM ACCESS (MAS) POSTERIOR LUMBAR INTERBODY FUSION (PLIF) 2 LEVEL  . ROTATOR CUFF REPAIR  1999   bil   . [place]  . TOTAL HIP ARTHROPLASTY Right 2008  . VT study      with Ablation    Current Medications: Current Meds  Medication Sig  . Acetaminophen (TYLENOL PO) Take 500 mg by mouth as needed.   Marland Kitchen albuterol (PROVENTIL HFA;VENTOLIN HFA) 108 (90 BASE) MCG/ACT inhaler Inhale 2 puffs into the lungs every 6 (six) hours as needed for wheezing.  Marland Kitchen aspirin EC 81 MG tablet Take 81 mg by mouth daily.  Marland Kitchen atorvastatin (LIPITOR) 40 MG tablet Take 40 mg by mouth daily.   . benzonatate (TESSALON) 200 MG capsule Take 200 mg by mouth at bedtime.   . calcitonin, salmon, (MIACALCIN/FORTICAL) 200 UNIT/ACT nasal spray Place 1 spray into alternate nostrils daily.  . clopidogrel (PLAVIX) 75 MG tablet Take 75 mg by mouth daily.  . clotrimazole (MYCELEX) 10 MG troche Take 10 mg by mouth as needed. Cream  . Cyanocobalamin (B-12 PO) Take 2,500 mcg by mouth daily.  Marland Kitchen donepezil (ARICEPT) 5 MG tablet Take 5 mg by mouth at bedtime.   . IRON PO Take 27 mg by mouth daily.  Marland Kitchen lactulose (CHRONULAC) 10 GM/15ML solution Take 10 g by mouth daily as needed for mild constipation.  Marland Kitchen loratadine (CLARITIN) 10 MG tablet Take 10 mg by mouth.  . Lutein 40 MG CAPS Take 40 mg by mouth daily.  . metoprolol succinate (TOPROL-XL) 50 MG 24 hr tablet Take 1 tablet (50 mg total) by mouth daily. Take with or immediately following a meal.  . montelukast (SINGULAIR) 10 MG tablet Take 10 mg by mouth at bedtime.  . Multiple Vitamins-Minerals (MULTIVITAMIN PO) Take 1 tablet by mouth daily.  . nitroGLYCERIN (NITROSTAT) 0.4 MG SL tablet DISSOLVE 1 TABLET UNDER THE TONGUE EVERY 5 MINUTES AS NEEDED FOR CHEST PAIN. DO NOT EXCEED A TOTAL OF 3 DOSES IN 15 MINUTES.  Marland Kitchen ondansetron (ZOFRAN) 4 MG tablet Take 4 mg by mouth every 8 (eight) hours as needed for nausea or vomiting.  . pantoprazole (PROTONIX) 40 MG tablet Take 40 mg by mouth daily.  . polyethylene glycol (MIRALAX / GLYCOLAX) 17 g packet Take 17 g by mouth daily.  Marland Kitchen rOPINIRole (REQUIP) 0.5 MG tablet Take 0.5 mg by mouth 3 (three) times daily.  . SYMBICORT  160-4.5 MCG/ACT inhaler Inhale 2 puffs into the lungs 2 (two) times daily.  . tamsulosin (FLOMAX) 0.4 MG CAPS capsule Take 0.4 mg by mouth daily after supper.  . traMADol (ULTRAM) 50 MG tablet Take 50 mg by mouth every 6 (six) hours as needed for moderate pain.   Marland Kitchen VITAMIN D PO Take by mouth daily.     Allergies:   Other, Ibuprofen, and Oxycodone   Social History   Socioeconomic History  . Marital status: Married    Spouse name: Mauel Corgan  . Number of children: 4  . Years of education: 73  . Highest education level: Not on file  Occupational History  . Occupation: Retired  Tobacco Use  . Smoking status: Former Smoker    Packs/day: 1.00    Years: 45.00    Pack years: 45.00    Types: Cigarettes    Quit  date: 02/02/2001    Years since quitting: 17.9  . Smokeless tobacco: Current User    Types: Chew  Substance and Sexual Activity  . Alcohol use: No  . Drug use: No  . Sexual activity: Not on file  Other Topics Concern  . Not on file  Social History Narrative   Lives with wife   Caffeine use: coffee, tea   Social Determinants of Health   Financial Resource Strain:   . Difficulty of Paying Living Expenses: Not on file  Food Insecurity:   . Worried About Charity fundraiser in the Last Year: Not on file  . Ran Out of Food in the Last Year: Not on file  Transportation Needs:   . Lack of Transportation (Medical): Not on file  . Lack of Transportation (Non-Medical): Not on file  Physical Activity:   . Days of Exercise per Week: Not on file  . Minutes of Exercise per Session: Not on file  Stress:   . Feeling of Stress : Not on file  Social Connections:   . Frequency of Communication with Friends and Family: Not on file  . Frequency of Social Gatherings with Friends and Family: Not on file  . Attends Religious Services: Not on file  . Active Member of Clubs or Organizations: Not on file  . Attends Archivist Meetings: Not on file  . Marital Status: Not on  file     Family History: The patient's family history includes Alzheimer's disease in his sister; Heart disease in his father, mother, sister, sister, and sister; Hemolytic uremic syndrome in his mother; Stroke in his father.  ROS:   Please see the history of present illness.    All other systems reviewed and are negative.  EKGs/Labs/Other Studies Reviewed:    The following studies were reviewed today:  IMPRESSIONS    1. Basal and mid inferolateral wall, basal inferoseptal segment, and basal inferior segment are abnormal.  2. The left ventricle has low normal systolic function, with an ejection fraction of 50-55%. The cavity size was normal. Left ventricular diastolic Doppler parameters are consistent with impaired relaxation.  3. The right ventricle has normal systolic function. The cavity was normal. There is no increase in right ventricular wall thickness.  4. Mild thickening of the mitral valve leaflet. There is mild mitral annular calcification present. No evidence of mitral valve stenosis.  5. The aortic valve is tricuspid. Mild thickening of the aortic valve. No stenosis of the aortic valve.  6. The aortic root, ascending aorta and aortic arch are normal in size and structure.  Recent Labs: 05/31/2018: ALT 27; BUN 64; Creatinine, Ser 1.55; Hemoglobin 7.3; Magnesium 1.8; Platelets 200; Potassium 4.2; Sodium 146  Recent Lipid Panel No results found for: CHOL, TRIG, HDL, CHOLHDL, VLDL, LDLCALC, LDLDIRECT  Physical Exam:    VS:  BP 120/78 (BP Location: Left Arm, Patient Position: Sitting)   Pulse 62   Ht 5\' 7"  (1.702 m)   Wt 172 lb 12.8 oz (78.4 kg)   SpO2 95%   BMI 27.06 kg/m     Wt Readings from Last 3 Encounters:  01/28/19 172 lb 12.8 oz (78.4 kg)  11/15/18 162 lb 3.2 oz (73.6 kg)  08/25/18 160 lb (72.6 kg)     GEN: Patient is in no acute distress HEENT: Normal NECK: No JVD; No carotid bruits LYMPHATICS: No lymphadenopathy CARDIAC: Hear sounds regular, 2/6  systolic murmur at the apex. RESPIRATORY:  Clear to auscultation without rales, wheezing or  rhonchi  ABDOMEN: Soft, non-tender, non-distended MUSCULOSKELETAL:  No edema; No deformity  SKIN: Warm and dry NEUROLOGIC:  Alert and oriented x 3 PSYCHIATRIC:  Normal affect   Signed, Jenean Lindau, MD  01/28/2019 2:53 PM    [place]

## 2019-01-29 LAB — BASIC METABOLIC PANEL
BUN/Creatinine Ratio: 11 (ref 10–24)
BUN: 13 mg/dL (ref 8–27)
CO2: 27 mmol/L (ref 20–29)
Calcium: 9.5 mg/dL (ref 8.6–10.2)
Chloride: 103 mmol/L (ref 96–106)
Creatinine, Ser: 1.15 mg/dL (ref 0.76–1.27)
GFR calc Af Amer: 69 mL/min/{1.73_m2} (ref >59)
GFR calc non Af Amer: 59 mL/min/{1.73_m2} — ABNORMAL LOW (ref >59)
Glucose: 89 mg/dL (ref 65–99)
Potassium: 3.4 mmol/L — ABNORMAL LOW (ref 3.5–5.2)
Sodium: 144 mmol/L (ref 134–144)

## 2019-02-04 ENCOUNTER — Telehealth: Payer: Self-pay | Admitting: Emergency Medicine

## 2019-02-04 DIAGNOSIS — I10 Essential (primary) hypertension: Secondary | ICD-10-CM

## 2019-02-04 NOTE — Telephone Encounter (Signed)
Called patient informed her of lab results and to add potassium to her diet like banana or orange juice. She will have labs rechecked in 1 week.

## 2019-02-10 DIAGNOSIS — I10 Essential (primary) hypertension: Secondary | ICD-10-CM | POA: Diagnosis not present

## 2019-02-11 LAB — BASIC METABOLIC PANEL
BUN/Creatinine Ratio: 10 (ref 10–24)
BUN: 11 mg/dL (ref 8–27)
CO2: 25 mmol/L (ref 20–29)
Calcium: 9.4 mg/dL (ref 8.6–10.2)
Chloride: 103 mmol/L (ref 96–106)
Creatinine, Ser: 1.14 mg/dL (ref 0.76–1.27)
GFR calc Af Amer: 69 mL/min/{1.73_m2} (ref >59)
GFR calc non Af Amer: 60 mL/min/{1.73_m2} (ref >59)
Glucose: 87 mg/dL (ref 65–99)
Potassium: 4 mmol/L (ref 3.5–5.2)
Sodium: 142 mmol/L (ref 134–144)

## 2019-02-15 DIAGNOSIS — I10 Essential (primary) hypertension: Secondary | ICD-10-CM | POA: Diagnosis not present

## 2019-02-15 DIAGNOSIS — J449 Chronic obstructive pulmonary disease, unspecified: Secondary | ICD-10-CM | POA: Diagnosis not present

## 2019-02-15 DIAGNOSIS — E785 Hyperlipidemia, unspecified: Secondary | ICD-10-CM | POA: Diagnosis not present

## 2019-02-15 DIAGNOSIS — J301 Allergic rhinitis due to pollen: Secondary | ICD-10-CM | POA: Diagnosis not present

## 2019-02-15 DIAGNOSIS — I2510 Atherosclerotic heart disease of native coronary artery without angina pectoris: Secondary | ICD-10-CM | POA: Diagnosis not present

## 2019-02-15 DIAGNOSIS — M818 Other osteoporosis without current pathological fracture: Secondary | ICD-10-CM | POA: Diagnosis not present

## 2019-02-15 DIAGNOSIS — E538 Deficiency of other specified B group vitamins: Secondary | ICD-10-CM | POA: Diagnosis not present

## 2019-02-15 DIAGNOSIS — G2581 Restless legs syndrome: Secondary | ICD-10-CM | POA: Diagnosis not present

## 2019-02-15 DIAGNOSIS — N400 Enlarged prostate without lower urinary tract symptoms: Secondary | ICD-10-CM | POA: Diagnosis not present

## 2019-02-15 DIAGNOSIS — J069 Acute upper respiratory infection, unspecified: Secondary | ICD-10-CM | POA: Diagnosis not present

## 2019-02-15 DIAGNOSIS — D649 Anemia, unspecified: Secondary | ICD-10-CM | POA: Diagnosis not present

## 2019-02-15 DIAGNOSIS — R05 Cough: Secondary | ICD-10-CM | POA: Diagnosis not present

## 2019-02-22 ENCOUNTER — Encounter: Payer: Self-pay | Admitting: *Deleted

## 2019-02-26 DIAGNOSIS — J4540 Moderate persistent asthma, uncomplicated: Secondary | ICD-10-CM | POA: Diagnosis not present

## 2019-03-14 DIAGNOSIS — R05 Cough: Secondary | ICD-10-CM | POA: Diagnosis not present

## 2019-03-14 DIAGNOSIS — Z20828 Contact with and (suspected) exposure to other viral communicable diseases: Secondary | ICD-10-CM | POA: Diagnosis not present

## 2019-03-22 DIAGNOSIS — R0602 Shortness of breath: Secondary | ICD-10-CM | POA: Diagnosis not present

## 2019-03-22 DIAGNOSIS — Z6827 Body mass index (BMI) 27.0-27.9, adult: Secondary | ICD-10-CM | POA: Diagnosis not present

## 2019-03-22 DIAGNOSIS — I10 Essential (primary) hypertension: Secondary | ICD-10-CM | POA: Diagnosis not present

## 2019-03-22 DIAGNOSIS — N400 Enlarged prostate without lower urinary tract symptoms: Secondary | ICD-10-CM | POA: Diagnosis not present

## 2019-03-22 DIAGNOSIS — E538 Deficiency of other specified B group vitamins: Secondary | ICD-10-CM | POA: Diagnosis not present

## 2019-03-22 DIAGNOSIS — D649 Anemia, unspecified: Secondary | ICD-10-CM | POA: Diagnosis not present

## 2019-03-22 DIAGNOSIS — M818 Other osteoporosis without current pathological fracture: Secondary | ICD-10-CM | POA: Diagnosis not present

## 2019-03-22 DIAGNOSIS — I2510 Atherosclerotic heart disease of native coronary artery without angina pectoris: Secondary | ICD-10-CM | POA: Diagnosis not present

## 2019-03-22 DIAGNOSIS — G2581 Restless legs syndrome: Secondary | ICD-10-CM | POA: Diagnosis not present

## 2019-03-22 DIAGNOSIS — E785 Hyperlipidemia, unspecified: Secondary | ICD-10-CM | POA: Diagnosis not present

## 2019-03-29 DIAGNOSIS — J4540 Moderate persistent asthma, uncomplicated: Secondary | ICD-10-CM | POA: Diagnosis not present

## 2019-04-05 DIAGNOSIS — E876 Hypokalemia: Secondary | ICD-10-CM | POA: Diagnosis not present

## 2019-04-05 DIAGNOSIS — R05 Cough: Secondary | ICD-10-CM | POA: Diagnosis not present

## 2019-04-05 DIAGNOSIS — R600 Localized edema: Secondary | ICD-10-CM | POA: Diagnosis not present

## 2019-04-05 DIAGNOSIS — J069 Acute upper respiratory infection, unspecified: Secondary | ICD-10-CM | POA: Diagnosis not present

## 2019-04-05 DIAGNOSIS — E663 Overweight: Secondary | ICD-10-CM | POA: Diagnosis not present

## 2019-04-05 DIAGNOSIS — Z6828 Body mass index (BMI) 28.0-28.9, adult: Secondary | ICD-10-CM | POA: Diagnosis not present

## 2019-04-26 DIAGNOSIS — J4540 Moderate persistent asthma, uncomplicated: Secondary | ICD-10-CM | POA: Diagnosis not present

## 2019-05-06 DIAGNOSIS — Z139 Encounter for screening, unspecified: Secondary | ICD-10-CM | POA: Diagnosis not present

## 2019-05-06 DIAGNOSIS — I10 Essential (primary) hypertension: Secondary | ICD-10-CM | POA: Diagnosis not present

## 2019-05-06 DIAGNOSIS — Z9181 History of falling: Secondary | ICD-10-CM | POA: Diagnosis not present

## 2019-05-06 DIAGNOSIS — Z6827 Body mass index (BMI) 27.0-27.9, adult: Secondary | ICD-10-CM | POA: Diagnosis not present

## 2019-05-06 DIAGNOSIS — E663 Overweight: Secondary | ICD-10-CM | POA: Diagnosis not present

## 2019-05-06 DIAGNOSIS — R600 Localized edema: Secondary | ICD-10-CM | POA: Diagnosis not present

## 2019-05-06 DIAGNOSIS — Z1331 Encounter for screening for depression: Secondary | ICD-10-CM | POA: Diagnosis not present

## 2019-05-06 DIAGNOSIS — D649 Anemia, unspecified: Secondary | ICD-10-CM | POA: Diagnosis not present

## 2019-06-09 DIAGNOSIS — R1310 Dysphagia, unspecified: Secondary | ICD-10-CM | POA: Diagnosis not present

## 2019-06-13 DIAGNOSIS — K449 Diaphragmatic hernia without obstruction or gangrene: Secondary | ICD-10-CM | POA: Diagnosis not present

## 2019-06-13 DIAGNOSIS — R1310 Dysphagia, unspecified: Secondary | ICD-10-CM | POA: Diagnosis not present

## 2019-06-13 DIAGNOSIS — K224 Dyskinesia of esophagus: Secondary | ICD-10-CM | POA: Diagnosis not present

## 2019-06-16 DIAGNOSIS — K224 Dyskinesia of esophagus: Secondary | ICD-10-CM | POA: Diagnosis not present

## 2019-06-16 DIAGNOSIS — K449 Diaphragmatic hernia without obstruction or gangrene: Secondary | ICD-10-CM | POA: Diagnosis not present

## 2019-06-22 DIAGNOSIS — H353132 Nonexudative age-related macular degeneration, bilateral, intermediate dry stage: Secondary | ICD-10-CM | POA: Diagnosis not present

## 2019-06-22 DIAGNOSIS — H527 Unspecified disorder of refraction: Secondary | ICD-10-CM | POA: Diagnosis not present

## 2019-06-22 DIAGNOSIS — Z961 Presence of intraocular lens: Secondary | ICD-10-CM | POA: Diagnosis not present

## 2019-06-22 DIAGNOSIS — H348392 Tributary (branch) retinal vein occlusion, unspecified eye, stable: Secondary | ICD-10-CM | POA: Diagnosis not present

## 2019-06-24 DIAGNOSIS — M818 Other osteoporosis without current pathological fracture: Secondary | ICD-10-CM | POA: Diagnosis not present

## 2019-06-24 DIAGNOSIS — Z6827 Body mass index (BMI) 27.0-27.9, adult: Secondary | ICD-10-CM | POA: Diagnosis not present

## 2019-06-24 DIAGNOSIS — D649 Anemia, unspecified: Secondary | ICD-10-CM | POA: Diagnosis not present

## 2019-06-24 DIAGNOSIS — I10 Essential (primary) hypertension: Secondary | ICD-10-CM | POA: Diagnosis not present

## 2019-06-24 DIAGNOSIS — E785 Hyperlipidemia, unspecified: Secondary | ICD-10-CM | POA: Diagnosis not present

## 2019-08-01 ENCOUNTER — Other Ambulatory Visit: Payer: Self-pay

## 2019-08-03 ENCOUNTER — Ambulatory Visit: Payer: PPO | Admitting: Cardiology

## 2019-08-03 ENCOUNTER — Other Ambulatory Visit: Payer: Self-pay

## 2019-08-03 VITALS — BP 136/80 | HR 74 | Ht 67.0 in | Wt 178.6 lb

## 2019-08-03 DIAGNOSIS — I2510 Atherosclerotic heart disease of native coronary artery without angina pectoris: Secondary | ICD-10-CM | POA: Diagnosis not present

## 2019-08-03 DIAGNOSIS — Z9889 Other specified postprocedural states: Secondary | ICD-10-CM | POA: Diagnosis not present

## 2019-08-03 DIAGNOSIS — E785 Hyperlipidemia, unspecified: Secondary | ICD-10-CM

## 2019-08-03 DIAGNOSIS — Z8679 Personal history of other diseases of the circulatory system: Secondary | ICD-10-CM | POA: Diagnosis not present

## 2019-08-03 DIAGNOSIS — E7800 Pure hypercholesterolemia, unspecified: Secondary | ICD-10-CM

## 2019-08-03 DIAGNOSIS — I10 Essential (primary) hypertension: Secondary | ICD-10-CM | POA: Diagnosis not present

## 2019-08-03 NOTE — Patient Instructions (Signed)

## 2019-08-03 NOTE — Progress Notes (Signed)
Cardiology Office Note:    Date:  08/03/2019   ID:  Chris Braun, DOB 04-20-1935, MRN [id number]  PCP:  [name]., MD (Inactive)  Cardiologist:  Jenean Lindau, MD   Referring MD: No ref. provider found    ASSESSMENT:    1. Coronary artery disease involving native coronary artery of native heart without angina pectoris   2. High cholesterol   3. Essential hypertension   4. Status post ablation of ventricular arrhythmia   5. Dyslipidemia    PLAN:    In order of problems listed above:  1. Coronary artery disease: Secondary prevention stressed with patient.  Importance of compliance with diet medication stressed and she vocalized understanding. 2. Essential hypertension: Blood pressure stable and he takes his medications regularly 3. Ventricular arrhythmias post ablation, nonsustained VT: Electrophysiology notes were evaluated.  Patient is on beta-blocker.  He is tolerating it well.  Asymptomatic. 4. Dementia: Stable managed by primary care physician. 5. Mixed dyslipidemia: Lipids were reviewed.  There followed by primary care physician and I saw that he had blood work done in March and I advised him about it.  Diet was suggested.  He is promising to comply.  I told him to be active and walk the best of his ability. 6. Patient will be seen in follow-up appointment in 6 months or earlier if the patient has any concerns    Medication Adjustments/Labs and Tests Ordered: Current medicines are reviewed at length with the patient today.  Concerns regarding medicines are outlined above.  No orders of the defined types were placed in this encounter.  No orders of the defined types were placed in this encounter.    No chief complaint on file.    History of Present Illness:    Chris Braun is a 84 y.o. male.  Patient has past medical history of coronary artery disease, essential hypertension dyslipidemia.  He has had history of stroke.  He has significant dementia.  He is  post ablation for ventricular arrhythmia.  His ventricular arrhythmia did not recur and he was evaluated by electrophysiologist for the same.  He is on beta-blocker and asymptomatic.  At the time of my evaluation, the patient is alert awake oriented and in no distress.  Past Medical History:  Diagnosis Date  . Acute hypoxemic respiratory failure ([place]) 05/27/2018  . AKI (acute kidney injury) ([place]) 05/27/2018  . Asthma   . Atrial tachycardia ([place]) 07/26/2018  . B12 deficiency   . CAD (coronary artery disease) 08/24/2014  . Carotid stenosis, bilateral 08/24/2014  . Cataracts, bilateral   . Compression fracture    BAck  . COPD (chronic obstructive pulmonary disease) ([place])   . Depression   . Diverticulosis    per colonscopy  . Dyslipidemia 08/24/2014  . Falls frequently 06/03/2015  . Gait abnormality 01/19/2018  . GERD (gastroesophageal reflux disease)   . Hiatal hernia   . High cholesterol   . Hypertension   . Idiopathic progressive polyneuropathy   . Kidney stones   . Macular degeneration   . Memory disorder 01/19/2018  . Myocardial infarction ([place]) 2002  . 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 Osteoporosis   . Right kidney stone 05/27/2018  . Right upper lobe pneumonia 05/27/2018  . Risk for falls 05/28/2015  . Status post ablation of ventricular arrhythmia 04/27/2018  . Stroke (cerebrum) ([place]) 01/17/2014  . Stroke ([place])   . Weakness of distal arms and legs 06/03/2015    Past Surgical  History:  Procedure Laterality Date  . CARDIAC CATHETERIZATION  2002,   stents   . CATARACT EXTRACTION Left 2016  . Colonscopy  2012  . CYSTOSCOPY W/ URETERAL STENT PLACEMENT Right 05/30/2018   Procedure: CYSTOSCOPY WITH RETROGRADE PYELOGRAM/URETERAL STENT PLACEMENT;  Surgeon: Irine Seal, MD;  Location: [place];  Service: Urology;  Laterality: Right;  . HIP FRACTURE SURGERY  2008   right  . HYDROCELE EXCISION  10/12/2014  . LITHOTRIPSY     2007, 2009, 2010, 2011, 2012, 2013  . LUMBAR  LAMINECTOMY/DECOMPRESSION MICRODISCECTOMY  03/15/2012   Procedure: LUMBAR LAMINECTOMY/DECOMPRESSION MICRODISCECTOMY 1 LEVEL;  Surgeon: Elaina Hoops, MD;  Location: [place] NEURO ORS;  Service: Neurosurgery;  Laterality: Left;  Left lumbar three-four decompressive lumbar laminectomy, discectomy  . MAXIMUM ACCESS (MAS)POSTERIOR LUMBAR INTERBODY FUSION (PLIF) 2 LEVEL N/A 12/08/2012   Procedure: FOR MAXIMUM ACCESS (MAS) POSTERIOR LUMBAR INTERBODY FUSION (PLIF) 2 LEVEL;  Surgeon: Elaina Hoops, MD;  Location: [place] NEURO ORS;  Service: Neurosurgery;  Laterality: N/A;  FOR MAXIMUM ACCESS (MAS) POSTERIOR LUMBAR INTERBODY FUSION (PLIF) 2 LEVEL  . ROTATOR CUFF REPAIR  1999   bil   . [place]  . TOTAL HIP ARTHROPLASTY Right 2008  . VT study     with Ablation    Current Medications: Current Meds  Medication Sig  . Acetaminophen (TYLENOL PO) Take 500 mg by mouth as needed.   Marland Kitchen albuterol (PROVENTIL HFA;VENTOLIN HFA) 108 (90 BASE) MCG/ACT inhaler Inhale 2 puffs into the lungs every 6 (six) hours as needed for wheezing.  Marland Kitchen aspirin EC 81 MG tablet Take 81 mg by mouth daily.  Marland Kitchen atorvastatin (LIPITOR) 40 MG tablet Take 40 mg by mouth daily.   . benzonatate (TESSALON) 200 MG capsule Take 200 mg by mouth at bedtime.   . calcitonin, salmon, (MIACALCIN/FORTICAL) 200 UNIT/ACT nasal spray Place 1 spray into alternate nostrils daily.  . clopidogrel (PLAVIX) 75 MG tablet Take 75 mg by mouth daily.  . clotrimazole (MYCELEX) 10 MG troche Take 10 mg by mouth as needed. Cream  . Cyanocobalamin (B-12 PO) Take 2,500 mcg by mouth daily.  Marland Kitchen donepezil (ARICEPT) 5 MG tablet Take 5 mg by mouth at bedtime.   . furosemide (LASIX) 20 MG tablet Take 1 tablet by mouth daily.  Marland Kitchen guaiFENesin (MUCINEX) 600 MG 12 hr tablet Take 600 mg by mouth 2 (two) times daily.  Marland Kitchen HYDROcodone-acetaminophen (NORCO/VICODIN) 5-325 MG tablet Take 1 tablet by mouth every 6 (six) hours as needed for moderate pain.  Marland Kitchen  ipratropium-albuterol (DUONEB) 0.5-2.5 (3) MG/3ML SOLN Take 3 mLs by nebulization 4 (four) times daily.  . IRON PO Take 27 mg by mouth daily.  Marland Kitchen ketoconazole (NIZORAL) 2 % cream Apply as needed to affected area.  . lactulose (CHRONULAC) 10 GM/15ML solution Take 10 g by mouth daily as needed for mild constipation.  Marland Kitchen loratadine (CLARITIN) 10 MG tablet Take 10 mg by mouth.  . Lutein 40 MG CAPS Take 40 mg by mouth daily.  . metoprolol succinate (TOPROL-XL) 50 MG 24 hr tablet Take 1 tablet (50 mg total) by mouth daily. Take with or immediately following a meal.  . montelukast (SINGULAIR) 10 MG tablet Take 10 mg by mouth at bedtime.  . Multiple Vitamins-Minerals (MULTIVITAMIN PO) Take 1 tablet by mouth daily.  . nitroGLYCERIN (NITROSTAT) 0.4 MG SL tablet DISSOLVE 1 TABLET UNDER THE TONGUE EVERY 5 MINUTES AS NEEDED FOR CHEST PAIN. DO NOT EXCEED A TOTAL OF 3 DOSES  IN 15 MINUTES.  Marland Kitchen ondansetron (ZOFRAN) 4 MG tablet Take 4 mg by mouth every 8 (eight) hours as needed for nausea or vomiting.  . pantoprazole (PROTONIX) 40 MG tablet Take 40 mg by mouth daily.  . polyethylene glycol (MIRALAX / GLYCOLAX) 17 g packet Take 17 g by mouth daily.  Marland Kitchen rOPINIRole (REQUIP) 0.5 MG tablet Take 0.5 mg by mouth 3 (three) times daily.  . tamsulosin (FLOMAX) 0.4 MG CAPS capsule Take 0.4 mg by mouth daily after supper.  . traMADol (ULTRAM) 50 MG tablet Take 50 mg by mouth every 6 (six) hours as needed for moderate pain.   Marland Kitchen VITAMIN D PO Take by mouth daily.     Allergies:   Other, Ibuprofen, and Oxycodone   Social History   Socioeconomic History  . Marital status: Married    Spouse name: Harold Moncus  . Number of children: 4  . Years of education: 30  . Highest education level: Not on file  Occupational History  . Occupation: Retired  Tobacco Use  . Smoking status: Former Smoker    Packs/day: 1.00    Years: 45.00    Pack years: 45.00    Types: Cigarettes    Quit date: 02/02/2001    Years since quitting: 18.5   . Smokeless tobacco: Current User    Types: Chew  Vaping Use  . Vaping Use: Never used  Substance and Sexual Activity  . Alcohol use: No  . Drug use: No  . Sexual activity: Not on file  Other Topics Concern  . Not on file  Social History Narrative   Lives with wife   Caffeine use: coffee, tea   Social Determinants of Health   Financial Resource Strain:   . Difficulty of Paying Living Expenses:   Food Insecurity:   . Worried About Charity fundraiser in the Last Year:   . Arboriculturist in the Last Year:   Transportation Needs:   . Film/video editor (Medical):   Marland Kitchen Lack of Transportation (Non-Medical):   Physical Activity:   . Days of Exercise per Week:   . Minutes of Exercise per Session:   Stress:   . Feeling of Stress :   Social Connections:   . Frequency of Communication with Friends and Family:   . Frequency of Social Gatherings with Friends and Family:   . Attends Religious Services:   . Active Member of Clubs or Organizations:   . Attends Archivist Meetings:   Marland Kitchen Marital Status:      Family History: The patient's family history includes Alzheimer's disease in his sister; Heart disease in his father, mother, sister, sister, and sister; Hemolytic uremic syndrome in his mother; Stroke in his father.  ROS:   Please see the history of present illness.    All other systems reviewed and are negative.  EKGs/Labs/Other Studies Reviewed:    The following studies were reviewed today: I discussed my findings with the patient at length.   Recent Labs: 02/10/2019: BUN 11; Creatinine, Ser 1.14; Potassium 4.0; Sodium 142  Recent Lipid Panel No results found for: CHOL, TRIG, HDL, CHOLHDL, VLDL, LDLCALC, LDLDIRECT  Physical Exam:    VS:  BP 136/80   Pulse 74   Ht 5\' 7"  (1.702 m)   Wt 178 lb 9.6 oz (81 kg)   SpO2 95%   BMI 27.97 kg/m     Wt Readings from Last 3 Encounters:  08/03/19 178 lb 9.6 oz (81 kg)  01/28/19 172 lb 12.8 oz (78.4 kg)    11/15/18 162 lb 3.2 oz (73.6 kg)     GEN: Patient is in no acute distress HEENT: Normal NECK: No JVD; No carotid bruits LYMPHATICS: No lymphadenopathy CARDIAC: Hear sounds regular, 2/6 systolic murmur at the apex. RESPIRATORY:  Clear to auscultation without rales, wheezing or rhonchi  ABDOMEN: Soft, non-tender, non-distended MUSCULOSKELETAL:  No edema; No deformity  SKIN: Warm and dry NEUROLOGIC:  Alert and oriented x 3 PSYCHIATRIC:  Normal affect   Signed, Jenean Lindau, MD  08/03/2019 11:17 AM    [place]

## 2019-08-23 DIAGNOSIS — J301 Allergic rhinitis due to pollen: Secondary | ICD-10-CM | POA: Diagnosis not present

## 2019-08-23 DIAGNOSIS — J4530 Mild persistent asthma, uncomplicated: Secondary | ICD-10-CM | POA: Diagnosis not present

## 2019-08-23 DIAGNOSIS — R918 Other nonspecific abnormal finding of lung field: Secondary | ICD-10-CM | POA: Diagnosis not present

## 2019-08-26 DIAGNOSIS — R918 Other nonspecific abnormal finding of lung field: Secondary | ICD-10-CM | POA: Diagnosis not present

## 2019-08-26 DIAGNOSIS — K449 Diaphragmatic hernia without obstruction or gangrene: Secondary | ICD-10-CM | POA: Diagnosis not present

## 2019-08-26 DIAGNOSIS — J439 Emphysema, unspecified: Secondary | ICD-10-CM | POA: Diagnosis not present

## 2019-08-26 DIAGNOSIS — I2510 Atherosclerotic heart disease of native coronary artery without angina pectoris: Secondary | ICD-10-CM | POA: Diagnosis not present

## 2019-08-26 DIAGNOSIS — I700 Atherosclerosis of aorta: Secondary | ICD-10-CM | POA: Diagnosis not present

## 2019-08-26 DIAGNOSIS — J438 Other emphysema: Secondary | ICD-10-CM | POA: Diagnosis not present

## 2019-09-05 ENCOUNTER — Other Ambulatory Visit: Payer: Self-pay | Admitting: Cardiology

## 2019-09-05 DIAGNOSIS — K5900 Constipation, unspecified: Secondary | ICD-10-CM | POA: Diagnosis not present

## 2019-09-05 DIAGNOSIS — J301 Allergic rhinitis due to pollen: Secondary | ICD-10-CM | POA: Diagnosis not present

## 2019-09-05 DIAGNOSIS — J4530 Mild persistent asthma, uncomplicated: Secondary | ICD-10-CM | POA: Diagnosis not present

## 2019-09-05 DIAGNOSIS — R918 Other nonspecific abnormal finding of lung field: Secondary | ICD-10-CM | POA: Diagnosis not present

## 2019-10-03 DIAGNOSIS — Z79899 Other long term (current) drug therapy: Secondary | ICD-10-CM | POA: Diagnosis not present

## 2019-10-03 DIAGNOSIS — E785 Hyperlipidemia, unspecified: Secondary | ICD-10-CM | POA: Diagnosis not present

## 2019-10-03 DIAGNOSIS — G2581 Restless legs syndrome: Secondary | ICD-10-CM | POA: Diagnosis not present

## 2019-10-03 DIAGNOSIS — J449 Chronic obstructive pulmonary disease, unspecified: Secondary | ICD-10-CM | POA: Diagnosis not present

## 2019-10-03 DIAGNOSIS — I2510 Atherosclerotic heart disease of native coronary artery without angina pectoris: Secondary | ICD-10-CM | POA: Diagnosis not present

## 2019-10-03 DIAGNOSIS — R05 Cough: Secondary | ICD-10-CM | POA: Diagnosis not present

## 2019-10-03 DIAGNOSIS — Z6827 Body mass index (BMI) 27.0-27.9, adult: Secondary | ICD-10-CM | POA: Diagnosis not present

## 2019-10-03 DIAGNOSIS — D649 Anemia, unspecified: Secondary | ICD-10-CM | POA: Diagnosis not present

## 2019-10-03 DIAGNOSIS — I10 Essential (primary) hypertension: Secondary | ICD-10-CM | POA: Diagnosis not present

## 2019-10-03 DIAGNOSIS — N400 Enlarged prostate without lower urinary tract symptoms: Secondary | ICD-10-CM | POA: Diagnosis not present

## 2019-10-03 DIAGNOSIS — E538 Deficiency of other specified B group vitamins: Secondary | ICD-10-CM | POA: Diagnosis not present

## 2019-10-27 ENCOUNTER — Emergency Department (HOSPITAL_COMMUNITY): Payer: PPO

## 2019-10-27 ENCOUNTER — Inpatient Hospital Stay (HOSPITAL_COMMUNITY)
Admission: EM | Admit: 2019-10-27 | Discharge: 2019-10-31 | DRG: 542 | Disposition: A | Discharge status: Skilled Nursing Facility | Payer: PPO | Attending: Internal Medicine | Admitting: Internal Medicine

## 2019-10-27 ENCOUNTER — Other Ambulatory Visit: Payer: Self-pay

## 2019-10-27 DIAGNOSIS — Z87442 Personal history of urinary calculi: Secondary | ICD-10-CM

## 2019-10-27 DIAGNOSIS — H3530 Unspecified macular degeneration: Secondary | ICD-10-CM | POA: Diagnosis present

## 2019-10-27 DIAGNOSIS — R41841 Cognitive communication deficit: Secondary | ICD-10-CM | POA: Diagnosis not present

## 2019-10-27 DIAGNOSIS — E7800 Pure hypercholesterolemia, unspecified: Secondary | ICD-10-CM | POA: Diagnosis not present

## 2019-10-27 DIAGNOSIS — Z8249 Family history of ischemic heart disease and other diseases of the circulatory system: Secondary | ICD-10-CM

## 2019-10-27 DIAGNOSIS — R1312 Dysphagia, oropharyngeal phase: Secondary | ICD-10-CM | POA: Diagnosis not present

## 2019-10-27 DIAGNOSIS — I252 Old myocardial infarction: Secondary | ICD-10-CM | POA: Diagnosis not present

## 2019-10-27 DIAGNOSIS — G309 Alzheimer's disease, unspecified: Secondary | ICD-10-CM | POA: Diagnosis not present

## 2019-10-27 DIAGNOSIS — E876 Hypokalemia: Secondary | ICD-10-CM | POA: Diagnosis not present

## 2019-10-27 DIAGNOSIS — S32020A Wedge compression fracture of second lumbar vertebra, initial encounter for closed fracture: Secondary | ICD-10-CM

## 2019-10-27 DIAGNOSIS — J45901 Unspecified asthma with (acute) exacerbation: Secondary | ICD-10-CM | POA: Diagnosis not present

## 2019-10-27 DIAGNOSIS — Z8673 Personal history of transient ischemic attack (TIA), and cerebral infarction without residual deficits: Secondary | ICD-10-CM

## 2019-10-27 DIAGNOSIS — M810 Age-related osteoporosis without current pathological fracture: Secondary | ICD-10-CM | POA: Diagnosis present

## 2019-10-27 DIAGNOSIS — Z7951 Long term (current) use of inhaled steroids: Secondary | ICD-10-CM

## 2019-10-27 DIAGNOSIS — Z823 Family history of stroke: Secondary | ICD-10-CM

## 2019-10-27 DIAGNOSIS — I10 Essential (primary) hypertension: Secondary | ICD-10-CM | POA: Diagnosis not present

## 2019-10-27 DIAGNOSIS — D649 Anemia, unspecified: Secondary | ICD-10-CM | POA: Diagnosis not present

## 2019-10-27 DIAGNOSIS — R269 Unspecified abnormalities of gait and mobility: Secondary | ICD-10-CM | POA: Diagnosis present

## 2019-10-27 DIAGNOSIS — M2550 Pain in unspecified joint: Secondary | ICD-10-CM | POA: Diagnosis not present

## 2019-10-27 DIAGNOSIS — J439 Emphysema, unspecified: Secondary | ICD-10-CM | POA: Diagnosis not present

## 2019-10-27 DIAGNOSIS — I498 Other specified cardiac arrhythmias: Secondary | ICD-10-CM | POA: Diagnosis not present

## 2019-10-27 DIAGNOSIS — E785 Hyperlipidemia, unspecified: Secondary | ICD-10-CM | POA: Diagnosis present

## 2019-10-27 DIAGNOSIS — R296 Repeated falls: Secondary | ICD-10-CM | POA: Diagnosis not present

## 2019-10-27 DIAGNOSIS — R52 Pain, unspecified: Secondary | ICD-10-CM | POA: Diagnosis not present

## 2019-10-27 DIAGNOSIS — J8410 Pulmonary fibrosis, unspecified: Secondary | ICD-10-CM | POA: Diagnosis not present

## 2019-10-27 DIAGNOSIS — Z79899 Other long term (current) drug therapy: Secondary | ICD-10-CM

## 2019-10-27 DIAGNOSIS — Z888 Allergy status to other drugs, medicaments and biological substances status: Secondary | ICD-10-CM

## 2019-10-27 DIAGNOSIS — J441 Chronic obstructive pulmonary disease with (acute) exacerbation: Secondary | ICD-10-CM | POA: Diagnosis present

## 2019-10-27 DIAGNOSIS — F0390 Unspecified dementia without behavioral disturbance: Secondary | ICD-10-CM | POA: Diagnosis not present

## 2019-10-27 DIAGNOSIS — Z7902 Long term (current) use of antithrombotics/antiplatelets: Secondary | ICD-10-CM

## 2019-10-27 DIAGNOSIS — F329 Major depressive disorder, single episode, unspecified: Secondary | ICD-10-CM | POA: Diagnosis present

## 2019-10-27 DIAGNOSIS — I2510 Atherosclerotic heart disease of native coronary artery without angina pectoris: Secondary | ICD-10-CM | POA: Diagnosis not present

## 2019-10-27 DIAGNOSIS — M6281 Muscle weakness (generalized): Secondary | ICD-10-CM | POA: Diagnosis not present

## 2019-10-27 DIAGNOSIS — Z20822 Contact with and (suspected) exposure to covid-19: Secondary | ICD-10-CM | POA: Diagnosis not present

## 2019-10-27 DIAGNOSIS — I471 Supraventricular tachycardia: Secondary | ICD-10-CM | POA: Diagnosis present

## 2019-10-27 DIAGNOSIS — Z981 Arthrodesis status: Secondary | ICD-10-CM | POA: Diagnosis not present

## 2019-10-27 DIAGNOSIS — M5489 Other dorsalgia: Secondary | ICD-10-CM | POA: Diagnosis not present

## 2019-10-27 DIAGNOSIS — Z885 Allergy status to narcotic agent status: Secondary | ICD-10-CM

## 2019-10-27 DIAGNOSIS — M545 Low back pain: Secondary | ICD-10-CM | POA: Diagnosis not present

## 2019-10-27 DIAGNOSIS — R5381 Other malaise: Secondary | ICD-10-CM | POA: Diagnosis not present

## 2019-10-27 DIAGNOSIS — M8448XA Pathological fracture, other site, initial encounter for fracture: Secondary | ICD-10-CM | POA: Diagnosis not present

## 2019-10-27 DIAGNOSIS — Z87891 Personal history of nicotine dependence: Secondary | ICD-10-CM

## 2019-10-27 DIAGNOSIS — K219 Gastro-esophageal reflux disease without esophagitis: Secondary | ICD-10-CM | POA: Diagnosis present

## 2019-10-27 DIAGNOSIS — F0280 Dementia in other diseases classified elsewhere without behavioral disturbance: Secondary | ICD-10-CM | POA: Diagnosis not present

## 2019-10-27 DIAGNOSIS — R0902 Hypoxemia: Secondary | ICD-10-CM | POA: Diagnosis not present

## 2019-10-27 DIAGNOSIS — J449 Chronic obstructive pulmonary disease, unspecified: Secondary | ICD-10-CM | POA: Diagnosis not present

## 2019-10-27 DIAGNOSIS — Z7982 Long term (current) use of aspirin: Secondary | ICD-10-CM

## 2019-10-27 DIAGNOSIS — R278 Other lack of coordination: Secondary | ICD-10-CM | POA: Diagnosis not present

## 2019-10-27 DIAGNOSIS — Z4789 Encounter for other orthopedic aftercare: Secondary | ICD-10-CM | POA: Diagnosis not present

## 2019-10-27 DIAGNOSIS — R0602 Shortness of breath: Secondary | ICD-10-CM | POA: Diagnosis not present

## 2019-10-27 DIAGNOSIS — M8008XD Age-related osteoporosis with current pathological fracture, vertebra(e), subsequent encounter for fracture with routine healing: Secondary | ICD-10-CM | POA: Diagnosis not present

## 2019-10-27 DIAGNOSIS — Z7401 Bed confinement status: Secondary | ICD-10-CM | POA: Diagnosis not present

## 2019-10-27 DIAGNOSIS — R062 Wheezing: Secondary | ICD-10-CM | POA: Diagnosis not present

## 2019-10-27 DIAGNOSIS — J9601 Acute respiratory failure with hypoxia: Secondary | ICD-10-CM | POA: Diagnosis not present

## 2019-10-27 DIAGNOSIS — Z955 Presence of coronary angioplasty implant and graft: Secondary | ICD-10-CM | POA: Diagnosis not present

## 2019-10-27 DIAGNOSIS — S32029D Unspecified fracture of second lumbar vertebra, subsequent encounter for fracture with routine healing: Secondary | ICD-10-CM | POA: Diagnosis not present

## 2019-10-27 DIAGNOSIS — R2689 Other abnormalities of gait and mobility: Secondary | ICD-10-CM | POA: Diagnosis not present

## 2019-10-27 HISTORY — DX: Pathological fracture, other site, initial encounter for fracture: M84.48XA

## 2019-10-27 HISTORY — DX: Wedge compression fracture of second lumbar vertebra, initial encounter for closed fracture: S32.020A

## 2019-10-27 LAB — BASIC METABOLIC PANEL
Anion gap: 11 (ref 5–15)
BUN: 14 mg/dL (ref 8–23)
CO2: 27 mmol/L (ref 22–32)
Calcium: 9.2 mg/dL (ref 8.9–10.3)
Chloride: 105 mmol/L (ref 98–111)
Creatinine, Ser: 1.18 mg/dL (ref 0.61–1.24)
GFR calc Af Amer: >60 mL/min (ref >60)
GFR calc non Af Amer: 58 mL/min — ABNORMAL LOW (ref >60)
Glucose, Bld: 108 mg/dL — ABNORMAL HIGH (ref 70–99)
Potassium: 3.3 mmol/L — ABNORMAL LOW (ref 3.5–5.1)
Sodium: 143 mmol/L (ref 135–145)

## 2019-10-27 LAB — URINALYSIS, ROUTINE W REFLEX MICROSCOPIC
Bacteria, UA: NONE SEEN
Bilirubin Urine: NEGATIVE
Glucose, UA: NEGATIVE mg/dL
Ketones, ur: NEGATIVE mg/dL
Leukocytes,Ua: NEGATIVE
Nitrite: NEGATIVE
Protein, ur: NEGATIVE mg/dL
Specific Gravity, Urine: 1.025 (ref 1.005–1.030)
pH: 5 (ref 5.0–8.0)

## 2019-10-27 LAB — I-STAT VENOUS BLOOD GAS, ED
Acid-Base Excess: 7 mmol/L — ABNORMAL HIGH (ref 0.0–2.0)
Bicarbonate: 32.3 mmol/L — ABNORMAL HIGH (ref 20.0–28.0)
Calcium, Ion: 1.15 mmol/L (ref 1.15–1.40)
HCT: 42 % (ref 39.0–52.0)
Hemoglobin: 14.3 g/dL (ref 13.0–17.0)
O2 Saturation: 76 %
Potassium: 3.3 mmol/L — ABNORMAL LOW (ref 3.5–5.1)
Sodium: 145 mmol/L (ref 135–145)
TCO2: 34 mmol/L — ABNORMAL HIGH (ref 22–32)
pCO2, Ven: 48.1 mmHg (ref 44.0–60.0)
pH, Ven: 7.434 — ABNORMAL HIGH (ref 7.250–7.430)
pO2, Ven: 40 mmHg (ref 32.0–45.0)

## 2019-10-27 LAB — CBC WITH DIFFERENTIAL/PLATELET
Abs Immature Granulocytes: 0.06 10*3/uL (ref 0.00–0.07)
Basophils Absolute: 0.1 10*3/uL (ref 0.0–0.1)
Basophils Relative: 1 %
Eosinophils Absolute: 0.1 10*3/uL (ref 0.0–0.5)
Eosinophils Relative: 1 %
HCT: 44.6 % (ref 39.0–52.0)
Hemoglobin: 14.4 g/dL (ref 13.0–17.0)
Immature Granulocytes: 1 %
Lymphocytes Relative: 12 %
Lymphs Abs: 1.4 10*3/uL (ref 0.7–4.0)
MCH: 30.4 pg (ref 26.0–34.0)
MCHC: 32.3 g/dL (ref 30.0–36.0)
MCV: 94.1 fL (ref 80.0–100.0)
Monocytes Absolute: 1.3 10*3/uL — ABNORMAL HIGH (ref 0.1–1.0)
Monocytes Relative: 11 %
Neutro Abs: 9.4 10*3/uL — ABNORMAL HIGH (ref 1.7–7.7)
Neutrophils Relative %: 74 %
Platelets: 145 10*3/uL — ABNORMAL LOW (ref 150–400)
RBC: 4.74 MIL/uL (ref 4.22–5.81)
RDW: 13.3 % (ref 11.5–15.5)
WBC: 12.3 10*3/uL — ABNORMAL HIGH (ref 4.0–10.5)
nRBC: 0 % (ref 0.0–0.2)

## 2019-10-27 LAB — SARS CORONAVIRUS 2 BY RT PCR (HOSPITAL ORDER, PERFORMED IN ~~LOC~~ HOSPITAL LAB): SARS Coronavirus 2: NEGATIVE

## 2019-10-27 MED ORDER — LUTEIN 40 MG PO CAPS: 40.0000 mg | ORAL_CAPSULE | Freq: Every day | ORAL | Status: DC

## 2019-10-27 MED ORDER — POLYETHYLENE GLYCOL 3350 17 G PO PACK
17.0000 g | PACK | Freq: Every day | ORAL | Status: DC
  Administered 2019-10-28 – 2019-10-31 (×4): 17 g via ORAL
  Filled 2019-10-27 (×4): qty 1

## 2019-10-27 MED ORDER — DOCUSATE SODIUM 100 MG PO CAPS
100.0000 mg | ORAL_CAPSULE | Freq: Two times a day (BID) | ORAL | Status: DC
  Administered 2019-10-27 – 2019-10-31 (×8): 100 mg via ORAL
  Filled 2019-10-27 (×8): qty 1

## 2019-10-27 MED ORDER — ONDANSETRON HCL 4 MG PO TABS: 4.0000 mg | ORAL_TABLET | Freq: Three times a day (TID) | ORAL | 0 refills | Status: DC | PRN

## 2019-10-27 MED ORDER — VITAMIN D 25 MCG (1000 UNIT) PO TABS
1000.0000 [IU] | ORAL_TABLET | Freq: Every day | ORAL | Status: DC
  Administered 2019-10-28 – 2019-10-31 (×4): 1000 [IU] via ORAL
  Filled 2019-10-27 (×4): qty 1

## 2019-10-27 MED ORDER — VITAMIN B-12 1000 MCG PO TABS
2500.0000 ug | ORAL_TABLET | Freq: Every day | ORAL | Status: DC
  Administered 2019-10-28 – 2019-10-31 (×4): 2500 ug via ORAL
  Filled 2019-10-27 (×4): qty 3

## 2019-10-27 MED ORDER — PREDNISONE 20 MG PO TABS
40.0000 mg | ORAL_TABLET | Freq: Every day | ORAL | Status: DC
  Administered 2019-10-27 – 2019-10-29 (×3): 40 mg via ORAL
  Filled 2019-10-27 (×4): qty 2

## 2019-10-27 MED ORDER — MONTELUKAST SODIUM 10 MG PO TABS
10.0000 mg | ORAL_TABLET | Freq: Every day | ORAL | Status: DC
  Administered 2019-10-27 – 2019-10-30 (×4): 10 mg via ORAL
  Filled 2019-10-27 (×4): qty 1

## 2019-10-27 MED ORDER — FENTANYL CITRATE (PF) 100 MCG/2ML IJ SOLN
25.0000 ug | Freq: Once | INTRAMUSCULAR | Status: AC
  Administered 2019-10-27: 25 ug via INTRAVENOUS
  Filled 2019-10-27: qty 2

## 2019-10-27 MED ORDER — PANTOPRAZOLE SODIUM 40 MG PO TBEC
40.0000 mg | DELAYED_RELEASE_TABLET | Freq: Every day | ORAL | Status: DC
  Administered 2019-10-27 – 2019-10-31 (×5): 40 mg via ORAL
  Filled 2019-10-27 (×5): qty 1

## 2019-10-27 MED ORDER — ONDANSETRON 4 MG PO TBDP
4.0000 mg | ORAL_TABLET | Freq: Once | ORAL | Status: AC
  Administered 2019-10-27: 4 mg via ORAL
  Filled 2019-10-27: qty 1

## 2019-10-27 MED ORDER — IPRATROPIUM-ALBUTEROL 0.5-2.5 (3) MG/3ML IN SOLN
3.0000 mL | Freq: Four times a day (QID) | RESPIRATORY_TRACT | Status: DC
  Administered 2019-10-27 – 2019-10-30 (×10): 3 mL via RESPIRATORY_TRACT
  Filled 2019-10-27 (×12): qty 3

## 2019-10-27 MED ORDER — CLOPIDOGREL BISULFATE 75 MG PO TABS
75.0000 mg | ORAL_TABLET | Freq: Every day | ORAL | Status: DC
  Administered 2019-10-27 – 2019-10-31 (×5): 75 mg via ORAL
  Filled 2019-10-27 (×5): qty 1

## 2019-10-27 MED ORDER — TAMSULOSIN HCL 0.4 MG PO CAPS
0.4000 mg | ORAL_CAPSULE | Freq: Every day | ORAL | Status: DC
  Administered 2019-10-27 – 2019-10-30 (×4): 0.4 mg via ORAL
  Filled 2019-10-27 (×4): qty 1

## 2019-10-27 MED ORDER — FUROSEMIDE 20 MG PO TABS
20.0000 mg | ORAL_TABLET | Freq: Every day | ORAL | Status: DC
  Administered 2019-10-27 – 2019-10-30 (×4): 20 mg via ORAL
  Filled 2019-10-27 (×4): qty 1

## 2019-10-27 MED ORDER — GUAIFENESIN ER 600 MG PO TB12
600.0000 mg | ORAL_TABLET | Freq: Two times a day (BID) | ORAL | Status: DC
  Administered 2019-10-27 – 2019-10-31 (×8): 600 mg via ORAL
  Filled 2019-10-27 (×8): qty 1

## 2019-10-27 MED ORDER — ADULT MULTIVITAMIN W/MINERALS CH
1.0000 | ORAL_TABLET | Freq: Every day | ORAL | Status: DC
  Administered 2019-10-27 – 2019-10-31 (×5): 1 via ORAL
  Filled 2019-10-27 (×5): qty 1

## 2019-10-27 MED ORDER — ENOXAPARIN SODIUM 40 MG/0.4ML ~~LOC~~ SOLN
40.0000 mg | SUBCUTANEOUS | Status: DC
  Administered 2019-10-27 – 2019-10-31 (×5): 40 mg via SUBCUTANEOUS
  Filled 2019-10-27 (×5): qty 0.4

## 2019-10-27 MED ORDER — HYDROCODONE-ACETAMINOPHEN 5-325 MG PO TABS
1.0000 | ORAL_TABLET | Freq: Four times a day (QID) | ORAL | 0 refills | Status: DC | PRN
Start: 2019-10-27 — End: 2019-10-30

## 2019-10-27 MED ORDER — FLUTICASONE FUROATE-VILANTEROL 200-25 MCG/INH IN AEPB
1.0000 | INHALATION_SPRAY | Freq: Every day | RESPIRATORY_TRACT | Status: DC
  Administered 2019-10-29 – 2019-10-31 (×3): 1 via RESPIRATORY_TRACT
  Filled 2019-10-27 (×2): qty 28

## 2019-10-27 MED ORDER — ALBUTEROL SULFATE (2.5 MG/3ML) 0.083% IN NEBU: 2.5000 mg | INHALATION_SOLUTION | Freq: Four times a day (QID) | RESPIRATORY_TRACT | Status: DC | PRN

## 2019-10-27 MED ORDER — LORATADINE 10 MG PO TABS
10.0000 mg | ORAL_TABLET | Freq: Every day | ORAL | Status: DC
  Administered 2019-10-28 – 2019-10-31 (×4): 10 mg via ORAL
  Filled 2019-10-27 (×4): qty 1

## 2019-10-27 MED ORDER — ATORVASTATIN CALCIUM 80 MG PO TABS
80.0000 mg | ORAL_TABLET | Freq: Every day | ORAL | Status: DC
  Administered 2019-10-27 – 2019-10-31 (×5): 80 mg via ORAL
  Filled 2019-10-27 (×3): qty 1
  Filled 2019-10-27: qty 2
  Filled 2019-10-27: qty 1

## 2019-10-27 MED ORDER — HYDROMORPHONE HCL 1 MG/ML IJ SOLN: 0.5000 mg | INTRAMUSCULAR | Status: DC | PRN

## 2019-10-27 MED ORDER — ONDANSETRON HCL 4 MG PO TABS: 4.0000 mg | ORAL_TABLET | Freq: Three times a day (TID) | ORAL | Status: DC | PRN

## 2019-10-27 MED ORDER — METOPROLOL SUCCINATE ER 50 MG PO TB24
50.0000 mg | ORAL_TABLET | Freq: Every day | ORAL | Status: DC
  Administered 2019-10-27: 50 mg via ORAL
  Filled 2019-10-27: qty 2

## 2019-10-27 MED ORDER — DONEPEZIL HCL 5 MG PO TABS
5.0000 mg | ORAL_TABLET | Freq: Every day | ORAL | Status: DC
  Administered 2019-10-27 – 2019-10-30 (×4): 5 mg via ORAL
  Filled 2019-10-27 (×4): qty 1

## 2019-10-27 MED ORDER — ROPINIROLE HCL 0.5 MG PO TABS
0.5000 mg | ORAL_TABLET | Freq: Three times a day (TID) | ORAL | Status: DC
  Administered 2019-10-27 – 2019-10-31 (×11): 0.5 mg via ORAL
  Filled 2019-10-27 (×11): qty 1

## 2019-10-27 MED ORDER — ACETAMINOPHEN 500 MG PO TABS: 500.0000 mg | ORAL_TABLET | Freq: Four times a day (QID) | ORAL | Status: DC | PRN

## 2019-10-27 MED ORDER — LACTULOSE 10 GM/15ML PO SOLN
10.0000 g | Freq: Every day | ORAL | Status: DC | PRN
  Filled 2019-10-27: qty 15

## 2019-10-27 MED ORDER — ASPIRIN EC 81 MG PO TBEC
81.0000 mg | DELAYED_RELEASE_TABLET | Freq: Every day | ORAL | Status: DC
  Administered 2019-10-28 – 2019-10-31 (×4): 81 mg via ORAL
  Filled 2019-10-27 (×4): qty 1

## 2019-10-27 MED ORDER — BENZONATATE 100 MG PO CAPS
200.0000 mg | ORAL_CAPSULE | Freq: Every day | ORAL | Status: DC
  Administered 2019-10-27 – 2019-10-30 (×4): 200 mg via ORAL
  Filled 2019-10-27 (×4): qty 2

## 2019-10-27 MED ORDER — CALCITONIN (SALMON) 200 UNIT/ACT NA SOLN
1.0000 | Freq: Every day | NASAL | Status: DC
  Administered 2019-10-28 – 2019-10-30 (×3): 1 via NASAL
  Filled 2019-10-27: qty 3.7

## 2019-10-27 MED ORDER — HYDROCODONE-ACETAMINOPHEN 5-325 MG PO TABS
1.0000 | ORAL_TABLET | Freq: Once | ORAL | Status: AC
  Administered 2019-10-27: 1 via ORAL
  Filled 2019-10-27: qty 1

## 2019-10-27 NOTE — ED Provider Notes (Addendum)
[facility] EMERGENCY DEPARTMENT Provider Note   CSN: [id number] Arrival date & time: 10/27/19  1048     History No chief complaint on file.   Chris Braun is a 84 y.o. male.  The history is provided by the EMS personnel and medical records. No language interpreter was used.     84 year old male significant history of COPD, respiratory failure, asthma, CAD, memory disorder, prior stroke brought here via EMS from home for evaluation of back pain and shortness of breath.  Per EMS note, wife report patient is having pain in his back and she was unable to help him into the car.  When EMS arrived, his oxygen was noted  at 78% on room air.  Patient was placed on a nonrebreather at 10 L and his sats improved.  Unfortunately, I was unable to obtain much history from patient as he appears to be altered and is a poor historian.  Level 5 caveats.  Past Medical History:  Diagnosis Date  . Acute hypoxemic respiratory failure ([place]) 05/27/2018  . AKI (acute kidney injury) ([place]) 05/27/2018  . Asthma   . Atrial tachycardia ([place]) 07/26/2018  . B12 deficiency   . CAD (coronary artery disease) 08/24/2014  . Carotid stenosis, bilateral 08/24/2014  . Cataracts, bilateral   . Compression fracture    BAck  . COPD (chronic obstructive pulmonary disease) ([place])   . Depression   . Diverticulosis    per colonscopy  . Dyslipidemia 08/24/2014  . Falls frequently 06/03/2015  . Gait abnormality 01/19/2018  . GERD (gastroesophageal reflux disease)   . Hiatal hernia   . High cholesterol   . Hypertension   . Idiopathic progressive polyneuropathy   . Kidney stones   . Macular degeneration   . Memory disorder 01/19/2018  . Myocardial infarction ([place]) 2002  . 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 Osteoporosis   . Right kidney stone 05/27/2018  . Right upper lobe pneumonia 05/27/2018  . Risk for falls 05/28/2015  . Status post ablation of ventricular arrhythmia 04/27/2018  . Stroke (cerebrum)  ([place]) 01/17/2014  . Stroke ([place])   . Weakness of distal arms and legs 06/03/2015    Patient Active Problem List   Diagnosis Date Noted  . Atrial tachycardia ([place]) 07/26/2018  . Stroke ([place])   . Osteoporosis   . Macular degeneration   . Idiopathic progressive polyneuropathy   . Hypertension   . High cholesterol   . Hiatal hernia   . GERD (gastroesophageal reflux disease)   . Asthma   . COPD (chronic obstructive pulmonary disease) ([place])   . Diverticulosis   . B12 deficiency   . Acute hypoxemic respiratory failure ([place]) 05/27/2018  . AKI (acute kidney injury) ([place]) 05/27/2018  . Right kidney stone 05/27/2018  . Right upper lobe pneumonia 05/27/2018  . Status post ablation of ventricular arrhythmia 04/27/2018  . Memory disorder 01/19/2018  . Gait abnormality 01/19/2018  . 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 Falls frequently 06/03/2015  . Weakness of distal arms and legs 06/03/2015  . Risk for falls 05/28/2015  . CAD (coronary artery disease) 08/24/2014  . Carotid stenosis, bilateral 08/24/2014  . Dyslipidemia 08/24/2014  . Stroke (cerebrum) ([place]) 01/17/2014    Past Surgical History:  Procedure Laterality Date  . CARDIAC CATHETERIZATION  2002,   stents   . CATARACT EXTRACTION Left 2016  . Colonscopy  2012  . CYSTOSCOPY W/ URETERAL STENT PLACEMENT Right 05/30/2018   Procedure: CYSTOSCOPY WITH RETROGRADE  PYELOGRAM/URETERAL STENT PLACEMENT;  Surgeon: Irine Seal, MD;  Location: [facility];  Service: Urology;  Laterality: Right;  . HIP FRACTURE SURGERY  2008   right  . HYDROCELE EXCISION  10/12/2014  . LITHOTRIPSY     2007, 2009, 2010, 2011, 2012, 2013  . LUMBAR LAMINECTOMY/DECOMPRESSION MICRODISCECTOMY  03/15/2012   Procedure: LUMBAR LAMINECTOMY/DECOMPRESSION MICRODISCECTOMY 1 LEVEL;  Surgeon: Elaina Hoops, MD;  Location: [facility];  Service: Neurosurgery;  Laterality: Left;  Left lumbar three-four decompressive lumbar laminectomy, discectomy  . MAXIMUM ACCESS  (MAS)POSTERIOR LUMBAR INTERBODY FUSION (PLIF) 2 LEVEL N/A 12/08/2012   Procedure: FOR MAXIMUM ACCESS (MAS) POSTERIOR LUMBAR INTERBODY FUSION (PLIF) 2 LEVEL;  Surgeon: Elaina Hoops, MD;  Location: [facility];  Service: Neurosurgery;  Laterality: N/A;  FOR MAXIMUM ACCESS (MAS) POSTERIOR LUMBAR INTERBODY FUSION (PLIF) 2 LEVEL  . ROTATOR CUFF REPAIR  1999   bil   . [place]  . TOTAL HIP ARTHROPLASTY Right 2008  . VT study     with Ablation       Family History  Problem Relation Age of Onset  . Hemolytic uremic syndrome Mother   . Heart disease Mother   . Stroke Father   . Heart disease Father   . Heart disease Sister   . Heart disease Sister   . Heart disease Sister   . Alzheimer's disease Sister     Social History   Tobacco Use  . Smoking status: Former Smoker    Packs/day: 1.00    Years: 45.00    Pack years: 45.00    Types: Cigarettes    Quit date: 02/02/2001    Years since quitting: 18.7  . Smokeless tobacco: Current User    Types: Chew  Vaping Use  . Vaping Use: Never used  Substance Use Topics  . Alcohol use: No  . Drug use: No    Home Medications Prior to Admission medications   Medication Sig Start Date End Date Taking? Authorizing Provider  Acetaminophen (TYLENOL PO) Take 500 mg by mouth as needed.     [provider]  albuterol (PROVENTIL HFA;VENTOLIN HFA) 108 (90 BASE) MCG/ACT inhaler Inhale 2 puffs into the lungs every 6 (six) hours as needed for wheezing.    [provider]  aspirin EC 81 MG tablet Take 81 mg by mouth daily.    [provider]  atorvastatin (LIPITOR) 40 MG tablet Take 40 mg by mouth daily.     [provider]  benzonatate (TESSALON) 200 MG capsule Take 200 mg by mouth at bedtime.     [provider]  calcitonin, salmon, (MIACALCIN/FORTICAL) 200 UNIT/ACT nasal spray Place 1 spray into alternate nostrils daily.    [provider]  cefdinir (OMNICEF) 300 MG  capsule Take 1 capsule by mouth daily. Patient not taking: Reported on 08/03/2019 04/05/19   [provider]  clopidogrel (PLAVIX) 75 MG tablet Take 75 mg by mouth daily. 06/20/15   [provider]  clotrimazole (MYCELEX) 10 MG troche Take 10 mg by mouth as needed. Cream    [provider]  Cyanocobalamin (B-12 PO) Take 2,500 mcg by mouth daily.    [provider]  donepezil (ARICEPT) 5 MG tablet Take 5 mg by mouth at bedtime.  11/05/15   [provider]  furosemide (LASIX) 20 MG tablet Take 1 tablet by mouth daily. 04/20/19   [provider]  guaiFENesin (MUCINEX) 600 MG 12 hr tablet Take 600  mg by mouth 2 (two) times daily.    [provider]  HYDROcodone-acetaminophen (NORCO/VICODIN) 5-325 MG tablet Take 1 tablet by mouth every 6 (six) hours as needed for moderate pain.    [provider]  ipratropium-albuterol (DUONEB) 0.5-2.5 (3) MG/3ML SOLN Take 3 mLs by nebulization 4 (four) times daily.    [provider]  IRON PO Take 27 mg by mouth daily.    [provider]  ketoconazole (NIZORAL) 2 % cream Apply as needed to affected area. 04/25/19   [provider]  lactulose (CHRONULAC) 10 GM/15ML solution Take 10 g by mouth daily as needed for mild constipation.    [provider]  loratadine (CLARITIN) 10 MG tablet Take 10 mg by mouth.    [provider]  Lutein 40 MG CAPS Take 40 mg by mouth daily.    [provider]  metoprolol succinate (TOPROL-XL) 50 MG 24 hr tablet TAKE ONE TABLET BY MOUTH ONCE DAILY WITH FOOD 09/06/19   Camnitz, [name], MD  montelukast (SINGULAIR) 10 MG tablet Take 10 mg by mouth at bedtime.    [provider]  Multiple Vitamins-Minerals (MULTIVITAMIN PO) Take 1 tablet by mouth daily.    [provider]  nitroGLYCERIN (NITROSTAT) 0.4 MG SL tablet DISSOLVE 1 TABLET UNDER THE TONGUE EVERY 5 MINUTES AS NEEDED FOR CHEST PAIN. DO NOT EXCEED A  TOTAL OF 3 DOSES IN 15 MINUTES. 06/07/18   Revankar, Reita Cliche, MD  ondansetron (ZOFRAN) 4 MG tablet Take 4 mg by mouth every 8 (eight) hours as needed for nausea or vomiting.    [provider]  pantoprazole (PROTONIX) 40 MG tablet Take 40 mg by mouth daily.    [provider]  polyethylene glycol (MIRALAX / GLYCOLAX) 17 g packet Take 17 g by mouth daily. 05/31/18   Aline August, MD  rOPINIRole (REQUIP) 0.5 MG tablet Take 0.5 mg by mouth 3 (three) times daily. 09/27/16   [provider]  SYMBICORT 160-4.5 MCG/ACT inhaler Inhale 2 puffs into the lungs 2 (two) times daily. 07/21/16   [provider]  tamsulosin (FLOMAX) 0.4 MG CAPS capsule Take 0.4 mg by mouth daily after supper.    [provider]  traMADol (ULTRAM) 50 MG tablet Take 50 mg by mouth every 6 (six) hours as needed for moderate pain.     [provider]  VITAMIN D PO Take by mouth daily.    [provider]    Allergies    Other, Ibuprofen, and Oxycodone  Review of Systems   Review of Systems  Unable to perform ROS: Mental status change    Physical Exam Updated Vital Signs BP 120/76   Pulse 67   Temp 98.2 F (36.8 C) (Oral)   Resp 15   Ht 5\' 7"  (1.702 m)   Wt 78 kg   SpO2 92%   BMI 26.94 kg/m   Physical Exam Vitals and nursing note reviewed.  Constitutional:      General: He is not in acute distress.    Appearance: He is well-developed.     Comments: Patient appears hard of hearing, currently wearing a nonrebreather mask in mild respiratory discomfort.  HENT:     Head: Atraumatic.  Eyes:     Conjunctiva/sclera: Conjunctivae normal.  Cardiovascular:     Rate and Rhythm: Normal rate and regular rhythm.     Pulses: Normal pulses.     Heart sounds: Normal heart sounds.  Pulmonary:     Effort:  Pulmonary effort is normal.     Breath sounds: Normal breath sounds.  Abdominal:     Palpations: Abdomen is soft.  Musculoskeletal:        General: Tenderness  (Tenderness along mid back on palpation without crepitus or step-off.  Increasing pain when patient tried to sit up.) present.     Cervical back: Neck supple.     Comments: Able to flex and extend both lower extremities.  Skin:    Findings: No rash.  Neurological:     Mental Status: He is alert.     GCS: GCS eye subscore is 4. GCS verbal subscore is 4. GCS motor subscore is 6.     Sensory: Sensation is intact.     Motor: Weakness present.     ED Results / Procedures / Treatments   Labs (all labs ordered are listed, but only abnormal results are displayed) Labs Reviewed  BASIC METABOLIC PANEL - Abnormal; Notable for the following components:      Result Value   Potassium 3.3 (*)    Glucose, Bld 108 (*)    GFR calc non Af Amer 58 (*)    All other components within normal limits  CBC WITH DIFFERENTIAL/PLATELET - Abnormal; Notable for the following components:   WBC 12.3 (*)    Platelets 145 (*)    Neutro Abs 9.4 (*)    Monocytes Absolute 1.3 (*)    All other components within normal limits  URINALYSIS, ROUTINE W REFLEX MICROSCOPIC - Abnormal; Notable for the following components:   Hgb urine dipstick SMALL (*)    All other components within normal limits  I-STAT VENOUS BLOOD GAS, ED - Abnormal; Notable for the following components:   pH, Ven 7.434 (*)    Bicarbonate 32.3 (*)    TCO2 34 (*)    Acid-Base Excess 7.0 (*)    Potassium 3.3 (*)    All other components within normal limits  SARS CORONAVIRUS 2 BY RT PCR (HOSPITAL ORDER, [facility])  BLOOD GAS, VENOUS    EKG None  Radiology DG Chest 1 View  Result Date: 10/27/2019 CLINICAL DATA:  Hypoxia, back pain EXAM: CHEST  1 VIEW COMPARISON:  06/07/2018, CT chest, 08/26/2027 FINDINGS: Low volume AP portable examination. Generally unchanged appearance of emphysematous change and fibrosis, better assessed by prior CT. No acute appearing airspace opacity. The heart and mediastinum are unremarkable.  IMPRESSION: Low volume AP portable examination. Generally unchanged appearance of emphysematous change and fibrosis, better assessed by prior CT. No acute appearing airspace opacity. Electronically Signed   By: Eddie Candle M.D.   On: 10/27/2019 11:47   DG Lumbar Spine Complete  Result Date: 10/27/2019 CLINICAL DATA:  Hypoxia, twisted back with lower back pain EXAM: LUMBAR SPINE - COMPLETE 4+ VIEW COMPARISON:  Lumbar spine evaluations from 2014, no recent comparison evaluations. FINDINGS: Signs of cement augmentation at the T12 level. Not seen on previous lumbar spine radiographs but partially imaged on a prior intraoperative radiograph for stent placement was performed on May 30, 2018. This area was not well assessed on the previous radiograph and there is kyphotic deformity at this level with posterior translation/retrolisthesis of T12 on L1 approximately 1 cm. Five lumbar type vertebral bodies are demonstrated. There is also loss of height at the L1 and L2 levels compared to previous imaging, these are age indeterminate. Spinal degenerative changes are noted. Post L3 through L5 spinal fusion with interbody cage placement at the L3-4 level. Degenerative changes in  the spine. Signs of L5 compression fracture which is similar to previous imaging. IMPRESSION: 1. Age indeterminate compression fractures of L1 and L2. 2. Signs of cement augmentation at T12 with kyphosis and retrolisthesis of T12 on L1. No comparison for spinal alignment is available. Correlate with any point tenderness at the thoracolumbar junction and upper lumbar spine to determine whether any of these findings may be acute or associated with acute worsening of existing fractures. 3. Post L3 through L5 spinal fusion with interbody cage placement at the L3-4 level. Electronically Signed   By: Zetta Bills M.D.   On: 10/27/2019 11:53    Procedures Procedures (including critical care time)  Medications Ordered in ED Medications    ondansetron (ZOFRAN-ODT) disintegrating tablet 4 mg (has no administration in time range)  HYDROcodone-acetaminophen (NORCO/VICODIN) 5-325 MG per tablet 1 tablet (has no administration in time range)  fentaNYL (SUBLIMAZE) injection 25 mcg (25 mcg Intravenous Given 10/27/19 1220)    ED Course  I have reviewed the triage vital signs and the nursing notes.  Pertinent labs & imaging results that were available during my care of the patient were reviewed by me and considered in my medical decision making (see chart for details).    MDM Rules/Calculators/A&P                          BP 129/71   Pulse (!) 59   Temp 98.2 F (36.8 C) (Oral)   Resp 18   Ht 5\' 7"  (1.702 m)   Wt 78 kg   SpO2 95%   BMI 26.94 kg/m   Final Clinical Impression(s) / ED Diagnoses Final diagnoses:  Compression fracture of L2 lumbar vertebra, closed, initial encounter ([place])    Rx / DC Orders ED Discharge Orders         Ordered    HYDROcodone-acetaminophen (NORCO/VICODIN) 5-325 MG tablet  Every 6 hours PRN        10/27/19 1541    ondansetron (ZOFRAN) 4 MG tablet  Every 8 hours PRN        10/27/19 1541         11:47 AM Initially I was having difficulty obtaining history from patient however at this time patient's wife is at bedside and able to provide history.  Patient does have history of recurrent back pain, use a walker.  2 days ago he was sitting, twisting and developed acute onset of lower back pain.  Since then his back pain has been increasing in severity causing him to have difficulty getting out of bed.  He is mostly laying in his recliner endorsing pain.  He also normally use albuterol inhaler as well as nebulizer but for the past few days he has not been using it.  He did not complain of any worsening shortness of breath according to wife.  No recent sick contacts and have not been coughing.  He has been able to have normal bowel movement and urination.  He has been vaccinated for COVID-19.  His wife  did attempt to bring patient to urgent care center yesterday for evaluation but after several hours wait they have to leave.  Today she was unable to help him to the car thus prompting contact to EMS.  1:17 PM Lumbar spine x-ray demonstrate age indeterminate compression fracture of the L1 and L2.  This does correspond with patient's point tenderness on exam when he sits up.  In the setting of acute onset of worsening  back pain and difficulty moving along with an x-ray that shows compression fracture of L1 and L2, I will order TLSO back brace for support.  Will consult PT for evaluation, and I have reached out to transition of care to help set up for home health needs.  I suspect hypoxia initially was secondary to patient's pain as well as his underlying lung pulmonary fibrosis.  Pain medication was given and now patient appears more comfortable and able to communicate better.  He is on 2 L O2 satting at 92%.  Care discussed with Dr. Johnney Killian.   3:33 PM With adequate pain control patient appears much more comfortable and mentating appropriately.  He is no longer hypoxic.  He is stable for discharge with TLSO brace.  He will call and follow-up closely with his back specialist as he may benefit from additional kyphoplasty's if indicated.  Transition of care will contact him to aid with PT OT support and home health needs.  His Covid test is negative.  Patient discharged home with opiate, as well as antinausea medication.  Return precaution discussed.  4:05 PM Unfortunately when physical therapy evaluate patient, he was unable to support himself to ambulate. PT recommend SNF.    5:52 PM Appreciate consultation from [facility] Dr. Roosevelt Locks who agrees to see and admit pt for obs.  Plan for SNF placement.   Chris Braun was evaluated in Emergency Department on 10/27/2019 for the symptoms described in the history of present illness. He was evaluated in the context of the global COVID-19 pandemic, which  necessitated consideration that the patient might be at risk for infection with the SARS-CoV-2 virus that causes COVID-19. Institutional protocols and algorithms that pertain to the evaluation of patients at risk for COVID-19 are in a state of rapid change based on information released by regulatory bodies including the CDC and federal and state organizations. These policies and algorithms were followed during the patient's care in the ED.    Domenic Moras, PA-C 10/27/19 1545    Domenic Moras, PA-C 10/27/19 1752    Charlesetta Shanks, MD 10/28/19 [phone]

## 2019-10-27 NOTE — ED Triage Notes (Signed)
Pt arrived to ED d/t back pain, wife was unable to help him in the car. EMS reported that his O2 was 78% on RA when they arrived. Upon arrival to ED pt was on 10L NRB at sating at 98%. A/Ox4.

## 2019-10-27 NOTE — Discharge Instructions (Signed)
Your back pain is likely due to compression fracture at your L1-L2 lower back.  Please continue to wear the TLSO brace for support.  Please follow-up closely with your orthopedist for further management of your back pain.  You may benefit from additional treatment such as kyphoplasty if indicated.  Take Zofran with your pain medication.  Return if your symptoms worsen.

## 2019-10-27 NOTE — ED Notes (Signed)
Pt transported to xray 

## 2019-10-27 NOTE — Progress Notes (Signed)
Orthopedic Tech Progress Note Patient Details:  Chris Braun 1935/10/05 [id number] Called [facility] for TLSO.  Patient ID: Chris Braun, male   DOB: 1935/11/23, 84 y.o.   MRN: [id number]   Petra Kuba 10/27/2019, 2:16 PM

## 2019-10-27 NOTE — ED Provider Notes (Signed)
Medical screening examination/treatment/procedure(s) were conducted as a shared visit with non-physician practitioner(s) and myself.  I personally evaluated the patient during the encounter.    Patient has history significant for COPD and back pain.  That pain has been severe in the central mid\lower back.  Patient has history of prior kyphoplasty of the lumbar spine with good pain improvement per the patient's wife.  His pain was so severe in his central back that he could not get into the car and his wife was unable to successfully assist him.  At the time of my evaluation, patient has been placed in T SLO brace for thoracic compression fracture and given pain medication.  Patient is denying pain at this time.  Patient is alert.  He does seem mildly confused with recall issues suggestive of dementia.  No respiratory distress at rest.  Lungs grossly clear.  Abdomen soft without guarding.  Patient is currently in T SLO brace.  If patient can successfully be pain controlled and adequate assistance and care at home, can consider discharge.  At this time patient will be getting PT evaluation.  I agree with plan of management.  Patient has failed PT evaluation and unable to bear weight due to pain with movement and ambulation.  Patient will require admission for pain control and probable SNF placement.   Charlesetta Shanks, MD 10/28/19 [phone]

## 2019-10-27 NOTE — Evaluation (Signed)
Physical Therapy Evaluation Patient Details Name: Chris Braun MRN: [id number] DOB: 09/01/1935 Today's Date: 10/27/2019   History of Present Illness  Pt is an 84 y/o male admitted secondary to increased back pain. Pt was unable to walk. Found to have L1-2 compression fx. Recommending TLSO. PMH includes asthma, HTN, CAD, COPD, osteoporosis and CVA.   Clinical Impression  Pt admitted secondary to problem above with deficits below. Pt requiring max to total A to perform bed mobility and stand at EOB. Pt unable to take steps this session. Pt's wife voicing concerns about taking pt home given current condition. Feel pt will not be able to perform necessary mobility tasks and is at increased risk for falls and injury. Feel he will benefit from SNF level therapies. Will continue to follow acutely to maximize functional mobility independence.     Follow Up Recommendations SNF;Supervision/Assistance - 24 hour    Equipment Recommendations  Wheelchair (measurements PT);Wheelchair cushion (measurements PT);Other (comment);Hospital bed (hoyer lift; hoyer pad)    Recommendations for Other Services       Precautions / Restrictions Precautions Precautions: Fall Precaution Comments: Pt's wife reports pt has frequent falls.  Required Braces or Orthoses: Spinal Brace Spinal Brace: Thoracolumbosacral orthotic Restrictions Weight Bearing Restrictions: No      Mobility  Bed Mobility Overal bed mobility: Needs Assistance Bed Mobility: Supine to Sit;Sit to Supine     Supine to sit: Total assist Sit to supine: Total assist   General bed mobility comments: Total A to come to perform bed mobility tasks. Pt with increased pain and grimacing throughout.   Transfers Overall transfer level: Needs assistance Equipment used: None Transfers: Sit to/from Stand Sit to Stand: Max assist         General transfer comment: Max A for lift assist and steadying to stand. Pt grimacing throughout. Was unable to  take steps at EOB. PT stood in front of pt and had pt hold to PT arms for support, however, pt frequently reaching back to bed for support  Ambulation/Gait                Stairs            Wheelchair Mobility    Modified Rankin (Stroke Patients Only)       Balance Overall balance assessment: Needs assistance Sitting-balance support: No upper extremity supported;Feet supported Sitting balance-Leahy Scale: Fair     Standing balance support: Bilateral upper extremity supported;During functional activity Standing balance-Leahy Scale: Zero Standing balance comment: Reliant on max A to maintain standing balance.                              Pertinent Vitals/Pain Pain Assessment: Faces Faces Pain Scale: Hurts worst Pain Location: back  Pain Descriptors / Indicators: Aching;Guarding;Grimacing Pain Intervention(s): Limited activity within patient's tolerance;Monitored during session;Repositioned    Home Living Family/patient expects to be discharged to:: Private residence Living Arrangements: Spouse/significant other Available Help at Discharge: Family Type of Home: House Home Access: Stairs to enter Entrance Stairs-Rails: None Technical brewer of Steps: 2-3 Home Layout: One level Home Equipment: Clinical cytogeneticist - 2 wheels      Prior Function Level of Independence: Independent with assistive device(s)         Comments: Reports pt used RW, but had frequent falls.      Hand Dominance        Extremity/Trunk Assessment   Upper Extremity Assessment Upper Extremity Assessment: Generalized weakness  Lower Extremity Assessment Lower Extremity Assessment: Generalized weakness    Cervical / Trunk Assessment Cervical / Trunk Assessment: Other exceptions Cervical / Trunk Exceptions: Lumbar compression fxs  Communication   Communication: No difficulties  Cognition Arousal/Alertness: Awake/alert Behavior During Therapy: WFL for tasks  assessed/performed Overall Cognitive Status: Impaired/Different from baseline Area of Impairment: Safety/judgement;Problem solving                         Safety/Judgement: Decreased awareness of deficits;Decreased awareness of safety   Problem Solving: Slow processing General Comments: Pt unaware of deficits. Was unable to follow commands throughout.       General Comments General comments (skin integrity, edema, etc.): Pt's wife present during session and very concerned about being able to physically assist pt as she has her own health issues.     Exercises     Assessment/Plan    PT Assessment Patient needs continued PT services  PT Problem List Decreased strength;Decreased balance;Decreased mobility;Decreased knowledge of use of DME;Decreased knowledge of precautions;Decreased activity tolerance;Decreased safety awareness       PT Treatment Interventions Gait training;DME instruction;Functional mobility training;Stair training;Therapeutic activities;Therapeutic exercise;Balance training;Patient/family education    PT Goals (Current goals can be found in the Care Plan section)  Acute Rehab PT Goals Patient Stated Goal: to make sure pt can walk before going home PT Goal Formulation: With patient/family Time For Goal Achievement: 11/10/19 Potential to Achieve Goals: Fair    Frequency Min 2X/week   Barriers to discharge Other (comment) wife unable to physically assist    Co-evaluation               AM-PAC PT "6 Clicks" Mobility  Outcome Measure Help needed turning from your back to your side while in a flat bed without using bedrails?: Total Help needed moving from lying on your back to sitting on the side of a flat bed without using bedrails?: Total Help needed moving to and from a bed to a chair (including a wheelchair)?: Total Help needed standing up from a chair using your arms (e.g., wheelchair or bedside chair)?: Total Help needed to walk in  hospital room?: Total Help needed climbing 3-5 steps with a railing? : Total 6 Click Score: 6    End of Session Equipment Utilized During Treatment: Gait belt;Back brace Activity Tolerance: Patient limited by pain Patient left: in bed;with call bell/phone within reach;with family/visitor present (on stretcher) Nurse Communication: Mobility status PT Visit Diagnosis: Unsteadiness on feet (R26.81);Muscle weakness (generalized) (M62.81);History of falling (Z91.81);Repeated falls (R29.6);Difficulty in walking, not elsewhere classified (R26.2);Pain Pain - part of body:  (back)    Time: 1550-1610 PT Time Calculation (min) (ACUTE ONLY): 20 min   Charges:   PT Evaluation $PT Eval Moderate Complexity: 1 Mod          Reuel Derby, PT, DPT  Acute Rehabilitation Services  Pager: [phone] Office: [phone]   Rudean Hitt 10/27/2019, 4:37 PM

## 2019-10-27 NOTE — H&P (Signed)
History and Physical    Chris Braun YTK:[id number] DOB: 15-Nov-1935 DOA: 10/27/2019  PCP: [name]., MD (Inactive) (Confirm with patient/family/NH records and if not entered, this has to be entered at [facility] point of entry) Patient coming from: Home  I have personally briefly reviewed patient's old medical records in [place]  Chief Complaint: Back pain  HPI: Chris Braun is a 84 y.o. male with medical history significant of severe osteoporosis with multiple thoracic spine compression fractures T7, T8, T9, T12 status post multiple back surgeries and the most recent one was in 2020, CAD with stents, asthma/COPD, chronic ambulation dysfunction, stroke, Alzheimer's dementia, presented with acute back pain and worsening of ambulation function.  Patient has been using walker to walk and very unsteady since he had stroke.  But according to patient wife patient never had fall at home.  And patient has severe osteoporosis and has been using intranasal calcitonin sprays and vitamin D and calcium supplement.  2 days ago, patient twisted his back while in bathroom, immediately patient started to feel severe back pain.  He denied any leg pain no trouble urinate, and no change of bowel habit, and he denied any numbness of perineal area.  No significant changes of leg strength and no numbness of of his legs.  Because of severe back pain, patient has been unable to stand up and walk again since yesterday and not able to use the inhaler for COPD/asthma because deep breathing and cough can cause more pain.  Wife called EMS this morning, when EMS arrived, they found patient O2 saturation in the 70s and patient was put on NRB. ED Course: O2 saturation stabilized 94% on 2 L, chest x-ray showed chronic fibrosis-like changes.  Lumbar x-ray showed age indeterminant compression fracture L1 and L2.  Review of Systems: As per HPI otherwise 14 point review of systems negative.    Past Medical History:  Diagnosis  Date  . Acute hypoxemic respiratory failure ([place]) 05/27/2018  . AKI (acute kidney injury) ([place]) 05/27/2018  . Asthma   . Atrial tachycardia ([place]) 07/26/2018  . B12 deficiency   . CAD (coronary artery disease) 08/24/2014  . Carotid stenosis, bilateral 08/24/2014  . Cataracts, bilateral   . Compression fracture    BAck  . COPD (chronic obstructive pulmonary disease) ([place])   . Depression   . Diverticulosis    per colonscopy  . Dyslipidemia 08/24/2014  . Falls frequently 06/03/2015  . Gait abnormality 01/19/2018  . GERD (gastroesophageal reflux disease)   . Hiatal hernia   . High cholesterol   . Hypertension   . Idiopathic progressive polyneuropathy   . Kidney stones   . Macular degeneration   . Memory disorder 01/19/2018  . Myocardial infarction ([place]) 2002  . 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 Osteoporosis   . Right kidney stone 05/27/2018  . Right upper lobe pneumonia 05/27/2018  . Risk for falls 05/28/2015  . Status post ablation of ventricular arrhythmia 04/27/2018  . Stroke (cerebrum) ([place]) 01/17/2014  . Stroke ([place])   . Weakness of distal arms and legs 06/03/2015    Past Surgical History:  Procedure Laterality Date  . CARDIAC CATHETERIZATION  2002,   stents   . CATARACT EXTRACTION Left 2016  . Colonscopy  2012  . CYSTOSCOPY W/ URETERAL STENT PLACEMENT Right 05/30/2018   Procedure: CYSTOSCOPY WITH RETROGRADE PYELOGRAM/URETERAL STENT PLACEMENT;  Surgeon: Irine Seal, MD;  Location: [facility];  Service: Urology;  Laterality: Right;  . HIP  FRACTURE SURGERY  2008   right  . HYDROCELE EXCISION  10/12/2014  . LITHOTRIPSY     2007, 2009, 2010, 2011, 2012, 2013  . LUMBAR LAMINECTOMY/DECOMPRESSION MICRODISCECTOMY  03/15/2012   Procedure: LUMBAR LAMINECTOMY/DECOMPRESSION MICRODISCECTOMY 1 LEVEL;  Surgeon: Elaina Hoops, MD;  Location: [place]UR[facility];  Service: Neurosurgery;  Laterality: Left;  Left lumbar three-four decompressive lumbar laminectomy, discectomy  . MAXIMUM ACCESS (MAS)POSTERIOR  LUMBAR INTERBODY FUSION (PLIF) 2 LEVEL N/A 12/08/2012   Procedure: FOR MAXIMUM ACCESS (MAS) POSTERIOR LUMBAR INTERBODY FUSION (PLIF) 2 LEVEL;  Surgeon: Elaina Hoops, MD;  Location: [place] [facility];  Service: Neurosurgery;  Laterality: N/A;  FOR MAXIMUM ACCESS (MAS) POSTERIOR LUMBAR INTERBODY FUSION (PLIF) 2 LEVEL  . ROTATOR CUFF REPAIR  1999   bil   . [place]  . TOTAL HIP ARTHROPLASTY Right 2008  . VT study     with Ablation     reports that he quit smoking about 18 years ago. His smoking use included cigarettes. He has a 45.00 pack-year smoking history. His smokeless tobacco use includes chew. He reports that he does not drink alcohol and does not use drugs.  Allergies  Allergen Reactions  . Other Nausea And Vomiting and Other (See Comments)    pneumonia vaccine- chills, vomiting, fever, lost use of legs and body function. Had a fall post inj. (05/25/2013)  . Ibuprofen     Heart doctor advised he cannot take this  . Oxycodone     Mental status changes    Family History  Problem Relation Age of Onset  . Hemolytic uremic syndrome Mother   . Heart disease Mother   . Stroke Father   . Heart disease Father   . Heart disease Sister   . Heart disease Sister   . Heart disease Sister   . Alzheimer's disease Sister     Prior to Admission medications   Medication Sig Start Date End Date Taking? Authorizing Provider  Acetaminophen (TYLENOL PO) Take 500 mg by mouth as needed.     [provider]  albuterol (PROVENTIL HFA;VENTOLIN HFA) 108 (90 BASE) MCG/ACT inhaler Inhale 2 puffs into the lungs every 6 (six) hours as needed for wheezing.    [provider]  aspirin EC 81 MG tablet Take 81 mg by mouth daily.    [provider]  atorvastatin (LIPITOR) 40 MG tablet Take 40 mg by mouth daily.     [provider]  benzonatate (TESSALON) 200 MG capsule Take 200 mg by mouth at bedtime.     [provider]  calcitonin, salmon,  (MIACALCIN/FORTICAL) 200 UNIT/ACT nasal spray Place 1 spray into alternate nostrils daily.    [provider]  cefdinir (OMNICEF) 300 MG capsule Take 1 capsule by mouth daily. Patient not taking: Reported on 08/03/2019 04/05/19   [provider]  clopidogrel (PLAVIX) 75 MG tablet Take 75 mg by mouth daily. 06/20/15   [provider]  clotrimazole (MYCELEX) 10 MG troche Take 10 mg by mouth as needed. Cream    [provider]  Cyanocobalamin (B-12 PO) Take 2,500 mcg by mouth daily.    [provider]  donepezil (ARICEPT) 5 MG tablet Take 5 mg by mouth at bedtime.  11/05/15   [provider]  furosemide (LASIX) 20 MG tablet Take 1 tablet by mouth daily. 04/20/19   [provider]  guaiFENesin (MUCINEX) 600 MG 12 hr tablet Take 600 mg by mouth 2 (two)  times daily.    [provider]  HYDROcodone-acetaminophen (NORCO/VICODIN) 5-325 MG tablet Take 1 tablet by mouth every 6 (six) hours as needed for moderate pain or severe pain. 10/27/19   Domenic Moras, PA-C  ipratropium-albuterol (DUONEB) 0.5-2.5 (3) MG/3ML SOLN Take 3 mLs by nebulization 4 (four) times daily.    [provider]  IRON PO Take 27 mg by mouth daily.    [provider]  ketoconazole (NIZORAL) 2 % cream Apply as needed to affected area. 04/25/19   [provider]  lactulose (CHRONULAC) 10 GM/15ML solution Take 10 g by mouth daily as needed for mild constipation.    [provider]  loratadine (CLARITIN) 10 MG tablet Take 10 mg by mouth.    [provider]  Lutein 40 MG CAPS Take 40 mg by mouth daily.    [provider]  metoprolol succinate (TOPROL-XL) 50 MG 24 hr tablet TAKE ONE TABLET BY MOUTH ONCE DAILY WITH FOOD 09/06/19   Camnitz, [name], MD  montelukast (SINGULAIR) 10 MG tablet Take 10 mg by mouth at bedtime.    [provider]  Multiple Vitamins-Minerals (MULTIVITAMIN PO) Take 1 tablet by mouth daily.     [provider]  nitroGLYCERIN (NITROSTAT) 0.4 MG SL tablet DISSOLVE 1 TABLET UNDER THE TONGUE EVERY 5 MINUTES AS NEEDED FOR CHEST PAIN. DO NOT EXCEED A TOTAL OF 3 DOSES IN 15 MINUTES. 06/07/18   Revankar, Reita Cliche, MD  ondansetron (ZOFRAN) 4 MG tablet Take 1 tablet (4 mg total) by mouth every 8 (eight) hours as needed for nausea or vomiting. 10/27/19   Domenic Moras, PA-C  pantoprazole (PROTONIX) 40 MG tablet Take 40 mg by mouth daily.    [provider]  polyethylene glycol (MIRALAX / GLYCOLAX) 17 g packet Take 17 g by mouth daily. 05/31/18   Aline August, MD  rOPINIRole (REQUIP) 0.5 MG tablet Take 0.5 mg by mouth 3 (three) times daily. 09/27/16   [provider]  SYMBICORT 160-4.5 MCG/ACT inhaler Inhale 2 puffs into the lungs 2 (two) times daily. 07/21/16   [provider]  tamsulosin (FLOMAX) 0.4 MG CAPS capsule Take 0.4 mg by mouth daily after supper.    [provider]  traMADol (ULTRAM) 50 MG tablet Take 50 mg by mouth every 6 (six) hours as needed for moderate pain.     [provider]  VITAMIN D PO Take by mouth daily.    [provider]    Physical Exam: Vitals:   10/27/19 1615 10/27/19 1630 10/27/19 1645 10/27/19 1700  BP: 139/75 (!) 145/76 (!) 84/71 125/74  Pulse: (!) 57 67 65 (!) 59  Resp: 17 13 18 17   Temp:      TempSrc:      SpO2: 97% 95% (!) 86% 94%  Weight:      Height:        Constitutional: NAD, calm, comfortable Vitals:   10/27/19 1615 10/27/19 1630 10/27/19 1645 10/27/19 1700  BP: 139/75 (!) 145/76 (!) 84/71 125/74  Pulse: (!) 57 67 65 (!) 59  Resp: 17 13 18 17   Temp:      TempSrc:      SpO2: 97% 95% (!) 86% 94%  Weight:      Height:       Eyes: PERRL, lids and conjunctivae normal ENMT: Mucous membranes are moist. Posterior pharynx clear of any exudate or lesions.Normal dentition.  Neck: normal, supple, no masses, no thyromegaly Respiratory: clear to auscultation bilaterally, diminished breathing  sound  bilaterally, no wheezing, no crackles. Normal respiratory effort. No accessory muscle use.  Cardiovascular: Regular rate and rhythm, no murmurs / rubs / gallops. No extremity edema. 2+ pedal pulses. No carotid bruits.  Abdomen: no tenderness, no masses palpated. No hepatosplenomegaly. Bowel sounds positive.  Musculoskeletal: no clubbing / cyanosis. No joint deformity upper and lower extremities. Good ROM, no contractures. Normal muscle tone.  Skin: no rashes, lesions, ulcers. No induration Neurologic: CN 2-12 grossly intact. Sensation intact, DTR normal. Strength 5/5 in all 4.  Psychiatric: Normal judgment and insight. Alert and oriented x 3. Normal mood.    Labs on Admission: I have personally reviewed following labs and imaging studies  CBC: Recent Labs  Lab 10/27/19 1221 10/27/19 1302  WBC 12.3*  --   NEUTROABS 9.4*  --   HGB 14.4 14.3  HCT 44.6 42.0  MCV 94.1  --   PLT 145*  --    Basic Metabolic Panel: Recent Labs  Lab 10/27/19 1221 10/27/19 1302  NA 143 145  K 3.3* 3.3*  CL 105  --   CO2 27  --   GLUCOSE 108*  --   BUN 14  --   CREATININE 1.18  --   CALCIUM 9.2  --    GFR: Estimated Creatinine Clearance: 45.9 mL/min (by C-G formula based on SCr of 1.18 mg/dL). Liver Function Tests: No results for input(s): AST, ALT, ALKPHOS, BILITOT, PROT, ALBUMIN in the last 168 hours. No results for input(s): LIPASE, AMYLASE in the last 168 hours. No results for input(s): AMMONIA in the last 168 hours. Coagulation Profile: No results for input(s): INR, PROTIME in the last 168 hours. Cardiac Enzymes: No results for input(s): CKTOTAL, CKMB, CKMBINDEX, TROPONINI in the last 168 hours. BNP (last 3 results) No results for input(s): PROBNP in the last 8760 hours. HbA1C: No results for input(s): HGBA1C in the last 72 hours. CBG: No results for input(s): GLUCAP in the last 168 hours. Lipid Profile: No results for input(s): CHOL, HDL, LDLCALC, TRIG, CHOLHDL, LDLDIRECT in the last  72 hours. Thyroid Function Tests: No results for input(s): TSH, T4TOTAL, FREET4, T3FREE, THYROIDAB in the last 72 hours. Anemia Panel: No results for input(s): VITAMINB12, FOLATE, FERRITIN, TIBC, IRON, RETICCTPCT in the last 72 hours. Urine analysis:    Component Value Date/Time   COLORURINE YELLOW 10/27/2019 [place] 10/27/2019 1113   LABSPEC 1.025 10/27/2019 1113   PHURINE 5.0 10/27/2019 1113   GLUCOSEU NEGATIVE 10/27/2019 1113   HGBUR SMALL (A) 10/27/2019 1113   BILIRUBINUR NEGATIVE 10/27/2019 [place] 10/27/2019 1113   PROTEINUR NEGATIVE 10/27/2019 1113   NITRITE NEGATIVE 10/27/2019 1113   LEUKOCYTESUR NEGATIVE 10/27/2019 1113    Radiological Exams on Admission: DG Chest 1 View  Result Date: 10/27/2019 CLINICAL DATA:  Hypoxia, back pain EXAM: CHEST  1 VIEW COMPARISON:  06/07/2018, CT chest, 08/26/2027 FINDINGS: Low volume AP portable examination. Generally unchanged appearance of emphysematous change and fibrosis, better assessed by prior CT. No acute appearing airspace opacity. The heart and mediastinum are unremarkable. IMPRESSION: Low volume AP portable examination. Generally unchanged appearance of emphysematous change and fibrosis, better assessed by prior CT. No acute appearing airspace opacity. Electronically Signed   By: Eddie Candle M.D.   On: 10/27/2019 11:47   DG Lumbar Spine Complete  Result Date: 10/27/2019 CLINICAL DATA:  Hypoxia, twisted back with lower back pain EXAM: LUMBAR SPINE - COMPLETE 4+ VIEW COMPARISON:  Lumbar spine evaluations from 2014, no recent comparison  evaluations. FINDINGS: Signs of cement augmentation at the T12 level. Not seen on previous lumbar spine radiographs but partially imaged on a prior intraoperative radiograph for stent placement was performed on May 30, 2018. This area was not well assessed on the previous radiograph and there is kyphotic deformity at this level with posterior translation/retrolisthesis of  T12 on L1 approximately 1 cm. Five lumbar type vertebral bodies are demonstrated. There is also loss of height at the L1 and L2 levels compared to previous imaging, these are age indeterminate. Spinal degenerative changes are noted. Post L3 through L5 spinal fusion with interbody cage placement at the L3-4 level. Degenerative changes in the spine. Signs of L5 compression fracture which is similar to previous imaging. IMPRESSION: 1. Age indeterminate compression fractures of L1 and L2. 2. Signs of cement augmentation at T12 with kyphosis and retrolisthesis of T12 on L1. No comparison for spinal alignment is available. Correlate with any point tenderness at the thoracolumbar junction and upper lumbar spine to determine whether any of these findings may be acute or associated with acute worsening of existing fractures. 3. Post L3 through L5 spinal fusion with interbody cage placement at the L3-4 level. Electronically Signed   By: Zetta Bills M.D.   On: 10/27/2019 11:53    EKG: Independently reviewed.  Sinus rhythm no acute ST-T changes  Assessment/Plan Active Problems:   Lumbar vertebral fracture, pathologic   Lumbar compression fracture, closed, initial encounter ([place])  (please populate well all problems here in Problem List. (For example, if patient is on BP meds at home and you resume or decide to hold them, it is a problem that needs to be her. Same for CAD, COPD, HLD and so on)  Acute ambulatory dysfunction -Secondary to new onset of pathological fracture of L1 and L2, from severe osteoporosis, no symptoms signs of cord compression or equina syndrome. -Poor candidate for surgical intervention given severe osteoporosis and high risk of nonhealing -Pain control today and PT evaluation tomorrow.  PT today failed because of severe back pain -Lumbar and thoracic brace in place  Acute hypoxic respite failure secondary to asthma/COPD exacerbation -Likely from not been able to use his home breathing  meds since yesterday -Wife reported the patient has limited exercise tolerance, walk of 1 to 2 minutes can initiate short of breath.  No formal lung function test before. -Resume home breathing meds, add long-acting beta agonist, short course p.o. steroid  History of CAD -Continue aspirin Plavix -Outpatient cardiology follow-up  Alzheimer's dementia -Continue Aricept  Osteoporosis -As above  DVT prophylaxis: Lovenox Code Status: Full code, long discussion with patient wife, patient and his wife do not expect to become long-term dependent on life supporting measures including ventilator or feeding tubes. Family Communication: Wife at bedside Disposition Plan: Expect 1 to 2 days hospital stay for pain control and PT evaluation Consults called: None Admission status: Telemetry admission   Lequita Halt MD [facility] Pager [phone]  10/27/2019, 6:13 PM

## 2019-10-28 ENCOUNTER — Encounter (HOSPITAL_COMMUNITY): Payer: Self-pay | Admitting: Internal Medicine

## 2019-10-28 MED ORDER — METHOCARBAMOL 500 MG PO TABS
500.0000 mg | ORAL_TABLET | Freq: Three times a day (TID) | ORAL | Status: DC
  Administered 2019-10-28 – 2019-10-31 (×10): 500 mg via ORAL
  Filled 2019-10-28 (×10): qty 1

## 2019-10-28 MED ORDER — METOPROLOL SUCCINATE ER 25 MG PO TB24
25.0000 mg | ORAL_TABLET | Freq: Every day | ORAL | Status: DC
  Administered 2019-10-28 – 2019-10-29 (×2): 25 mg via ORAL
  Filled 2019-10-28 (×3): qty 1

## 2019-10-28 MED ORDER — TRAMADOL HCL 50 MG PO TABS
50.0000 mg | ORAL_TABLET | Freq: Four times a day (QID) | ORAL | Status: DC | PRN
  Administered 2019-10-29 – 2019-10-31 (×3): 50 mg via ORAL
  Filled 2019-10-28 (×4): qty 1

## 2019-10-28 MED ORDER — POTASSIUM CHLORIDE CRYS ER 20 MEQ PO TBCR
40.0000 meq | EXTENDED_RELEASE_TABLET | Freq: Once | ORAL | Status: AC
  Administered 2019-10-28: 40 meq via ORAL
  Filled 2019-10-28: qty 2

## 2019-10-28 MED ORDER — ACETAMINOPHEN 500 MG PO TABS
1000.0000 mg | ORAL_TABLET | Freq: Three times a day (TID) | ORAL | Status: DC
  Administered 2019-10-28 – 2019-10-31 (×10): 1000 mg via ORAL
  Filled 2019-10-28 (×10): qty 2

## 2019-10-28 MED ORDER — HYDROMORPHONE HCL 1 MG/ML IJ SOLN: 0.5000 mg | INTRAMUSCULAR | Status: DC | PRN

## 2019-10-28 NOTE — Plan of Care (Signed)

## 2019-10-28 NOTE — Progress Notes (Addendum)
Progress Note    MARKEY DEADY  PRF:[id number] DOB: Aug 15, 1935  DOA: 10/27/2019 PCP: [name]., MD (Inactive)    Brief Narrative:     Medical records reviewed and are as summarized below:  DELEON PASSE is an 84 y.o. male with medical history significant of severe osteoporosis with multiple thoracic spine compression fractures T7, T8, T9, T12 status post multiple back surgeries and the most recent one was in 2020, CAD with stents, asthma/COPD,  dementia, presented with acute back pain and worsening of ambulation function.  Patient has been using walker to walk and very unsteady since he had stroke.  But according to patient wife patient never had fall at home.  And patient has severe osteoporosis and has been using intranasal calcitonin sprays and vitamin D and calcium supplement.  2 days ago, patient twisted his back while in bathroom, immediately patient started to feel severe back pain.   Assessment/Plan:   Active Problems:   Lumbar vertebral fracture, pathologic   Compression fracture of L2 lumbar vertebra, closed, initial encounter ([place])   Acute ambulatory dysfunction due to new onset of pathological fracture of L1 and L2, from severe osteoporosis, no symptoms signs of cord compression or equina syndrome. -Poor candidate for surgical intervention given severe osteoporosis and high risk of nonhealing -Pain control with scheduled tylenol and prn tramadol  -PT/OT -brace ordered -? SNF placement  Acute hypoxic respiratory failure secondary to asthma/COPD exacerbation -Likely from not using his home breathing meds  -Wife reported the patient has limited exercise tolerance, walk of 1 to 2 minutes can initiate short of breath.  No formal lung function test before. -Resume home breathing meds, add long-acting beta agonist, short course p.o. steroid  Bradycardia -reduce BB for now with close monitoring on tele (follows with EP for arrhythmia) -if bradycardia is only while  sleeping can probably increase back  History of CAD -Continue aspirin Plavix -Outpatient cardiology follow-up   dementia -Continue Aricept  Osteoporosis -As above  Hypokalemia -replete   Family Communication/Anticipated D/C date and plan/Code Status   DVT prophylaxis: Lovenox ordered. Code Status: Full Code.  Disposition Plan: Status is: Inpatient  Remains inpatient appropriate because:Inpatient level of care appropriate due to severity of illness   Dispo: The patient is from: Home              Anticipated d/c is to: SNF              Anticipated d/c date is: 2 days              Patient currently is not medically stable to d/c.- needs pain controlled and safe d/c plan         Medical Consultants:    None.     Subjective:   No pain while in bed  Objective:    Vitals:   10/28/19 0011 10/28/19 0111 10/28/19 0359 10/28/19 0738  BP: 109/72 123/69 127/87 133/80  Pulse: 75 62 66 64  Resp: 20  18 15   Temp:  98.6 F (37 C) (!) 97.5 F (36.4 C) 97.9 F (36.6 C)  TempSrc:  Oral Oral Oral  SpO2: 92% 92% 95% 95%  Weight:      Height:        Intake/Output Summary (Last 24 hours) at 10/28/2019 0940 Last data filed at 10/28/2019 0401 Gross per 24 hour  Intake --  Output 350 ml  Net -350 ml   Filed Weights   10/27/19 1227  Weight:  78 kg    Exam:  General: Appearance:     Frail male in no acute distress     Lungs:     Clear to auscultation bilaterally, respirations unlabored  Heart:    Normal heart rate. -- occasionally bradycardic on tele   MS:   All extremities are intact- moves all 4 ext  Neurologic:   Awake, alert, pleasant and cooperative    Data Reviewed:   I have personally reviewed following labs and imaging studies:  Labs: Labs show the following:   Basic Metabolic Panel: Recent Labs  Lab 10/27/19 1221 10/27/19 1302  NA 143 145  K 3.3* 3.3*  CL 105  --   CO2 27  --   GLUCOSE 108*  --   BUN 14  --   CREATININE 1.18  --    CALCIUM 9.2  --    GFR Estimated Creatinine Clearance: 45.9 mL/min (by C-G formula based on SCr of 1.18 mg/dL). Liver Function Tests: No results for input(s): AST, ALT, ALKPHOS, BILITOT, PROT, ALBUMIN in the last 168 hours. No results for input(s): LIPASE, AMYLASE in the last 168 hours. No results for input(s): AMMONIA in the last 168 hours. Coagulation profile No results for input(s): INR, PROTIME in the last 168 hours.  CBC: Recent Labs  Lab 10/27/19 1221 10/27/19 1302  WBC 12.3*  --   NEUTROABS 9.4*  --   HGB 14.4 14.3  HCT 44.6 42.0  MCV 94.1  --   PLT 145*  --    Cardiac Enzymes: No results for input(s): CKTOTAL, CKMB, CKMBINDEX, TROPONINI in the last 168 hours. BNP (last 3 results) No results for input(s): PROBNP in the last 8760 hours. CBG: No results for input(s): GLUCAP in the last 168 hours. D-Dimer: No results for input(s): DDIMER in the last 72 hours. Hgb A1c: No results for input(s): HGBA1C in the last 72 hours. Lipid Profile: No results for input(s): CHOL, HDL, LDLCALC, TRIG, CHOLHDL, LDLDIRECT in the last 72 hours. Thyroid function studies: No results for input(s): TSH, T4TOTAL, T3FREE, THYROIDAB in the last 72 hours.  Invalid input(s): FREET3 Anemia work up: No results for input(s): VITAMINB12, FOLATE, FERRITIN, TIBC, IRON, RETICCTPCT in the last 72 hours. Sepsis Labs: Recent Labs  Lab 10/27/19 1221  WBC 12.3*    Microbiology Recent Results (from the past 240 hour(s))  SARS Coronavirus 2 by RT PCR (hospital order, performed in [facility] hospital lab) Nasopharyngeal Nasopharyngeal Swab     Status: None   Collection Time: 10/27/19 12:27 PM   Specimen: Nasopharyngeal Swab  Result Value Ref Range Status   SARS Coronavirus 2 NEGATIVE NEGATIVE Final    Comment: (NOTE) SARS-CoV-2 target nucleic acids are NOT DETECTED.  The SARS-CoV-2 RNA is generally detectable in upper and lower respiratory specimens during the acute phase of infection. The  lowest concentration of SARS-CoV-2 viral copies this assay can detect is 250 copies / mL. A negative result does not preclude SARS-CoV-2 infection and should not be used as the sole basis for treatment or other patient management decisions.  A negative result may occur with improper specimen collection / handling, submission of specimen other than nasopharyngeal swab, presence of viral mutation(s) within the areas targeted by this assay, and inadequate number of viral copies (<250 copies / mL). A negative result must be combined with clinical observations, patient history, and epidemiological information.  Fact Sheet for Patients:   StrictlyIdeas.no  Fact Sheet for Healthcare Providers: BankingDealers.co.za  This test is not yet approved  or  cleared by the [place] and has been authorized for detection and/or diagnosis of SARS-CoV-2 by FDA under an Emergency Use Authorization (EUA).  This EUA will remain in effect (meaning this test can be used) for the duration of the COVID-19 declaration under Section 564(b)(1) of the Act, 21 U.S.C. section 360bbb-3(b)(1), unless the authorization is terminated or revoked sooner.  Performed at [facility], [place] [address], [place], [place] [address]     Procedures and diagnostic studies:  DG Chest 1 View  Result Date: 10/27/2019 CLINICAL DATA:  Hypoxia, back pain EXAM: CHEST  1 VIEW COMPARISON:  06/07/2018, CT chest, 08/26/2027 FINDINGS: Low volume AP portable examination. Generally unchanged appearance of emphysematous change and fibrosis, better assessed by prior CT. No acute appearing airspace opacity. The heart and mediastinum are unremarkable. IMPRESSION: Low volume AP portable examination. Generally unchanged appearance of emphysematous change and fibrosis, better assessed by prior CT. No acute appearing airspace opacity. Electronically Signed   By: Eddie Candle M.D.   On:  10/27/2019 11:47   DG Lumbar Spine Complete  Result Date: 10/27/2019 CLINICAL DATA:  Hypoxia, twisted back with lower back pain EXAM: LUMBAR SPINE - COMPLETE 4+ VIEW COMPARISON:  Lumbar spine evaluations from 2014, no recent comparison evaluations. FINDINGS: Signs of cement augmentation at the T12 level. Not seen on previous lumbar spine radiographs but partially imaged on a prior intraoperative radiograph for stent placement was performed on May 30, 2018. This area was not well assessed on the previous radiograph and there is kyphotic deformity at this level with posterior translation/retrolisthesis of T12 on L1 approximately 1 cm. Five lumbar type vertebral bodies are demonstrated. There is also loss of height at the L1 and L2 levels compared to previous imaging, these are age indeterminate. Spinal degenerative changes are noted. Post L3 through L5 spinal fusion with interbody cage placement at the L3-4 level. Degenerative changes in the spine. Signs of L5 compression fracture which is similar to previous imaging. IMPRESSION: 1. Age indeterminate compression fractures of L1 and L2. 2. Signs of cement augmentation at T12 with kyphosis and retrolisthesis of T12 on L1. No comparison for spinal alignment is available. Correlate with any point tenderness at the thoracolumbar junction and upper lumbar spine to determine whether any of these findings may be acute or associated with acute worsening of existing fractures. 3. Post L3 through L5 spinal fusion with interbody cage placement at the L3-4 level. Electronically Signed   By: Zetta Bills M.D.   On: 10/27/2019 11:53    Medications:   . acetaminophen  1,000 mg Oral TID  . aspirin EC  81 mg Oral Daily  . atorvastatin  80 mg Oral Daily  . benzonatate  200 mg Oral QHS  . calcitonin (salmon)  1 spray Alternating Nares Daily  . cholecalciferol  1,000 Units Oral Daily  . clopidogrel  75 mg Oral Daily  . docusate sodium  100 mg Oral BID  . donepezil  5  mg Oral QHS  . enoxaparin (LOVENOX) injection  40 mg Subcutaneous Q24H  . fluticasone furoate-vilanterol  1 puff Inhalation Daily  . furosemide  20 mg Oral Daily  . guaiFENesin  600 mg Oral BID  . ipratropium-albuterol  3 mL Nebulization QID  . loratadine  10 mg Oral Daily  . methocarbamol  500 mg Oral TID  . metoprolol succinate  25 mg Oral Daily  . montelukast  10 mg Oral QHS  . multivitamin with minerals  1 tablet  Oral Daily  . pantoprazole  40 mg Oral Daily  . polyethylene glycol  17 g Oral Daily  . predniSONE  40 mg Oral Q breakfast  . rOPINIRole  0.5 mg Oral TID  . tamsulosin  0.4 mg Oral QPC supper  . vitamin B-12  2,500 mcg Oral Daily   Continuous Infusions:   LOS: 1 day   Geradine Girt  [facility]   How to contact the [facility] Attending or Consulting provider [place] or covering provider during after hours [place], for this patient?  1. Check the care team in [facility] and look for a) attending/consulting TRH provider listed and b) the [facility] team listed 2. Log into [email or web address] and use [place]'s universal password to access. If you do not have the password, please contact the hospital operator. 3. Locate the [facility] provider you are looking for under [facility] and page to a number that you can be directly reached. 4. If you still have difficulty reaching the provider, please page the [facility] (Director on Call) for the Hospitalists listed on amion for assistance.  10/28/2019, 9:40 AM

## 2019-10-28 NOTE — ED Notes (Signed)
Attempted report x1. 

## 2019-10-29 LAB — CBC
HCT: 36.5 % — ABNORMAL LOW (ref 39.0–52.0)
Hemoglobin: 12.6 g/dL — ABNORMAL LOW (ref 13.0–17.0)
MCH: 30.8 pg (ref 26.0–34.0)
MCHC: 34.5 g/dL (ref 30.0–36.0)
MCV: 89.2 fL (ref 80.0–100.0)
Platelets: 153 10*3/uL (ref 150–400)
RBC: 4.09 MIL/uL — ABNORMAL LOW (ref 4.22–5.81)
RDW: 12.9 % (ref 11.5–15.5)
WBC: 10.6 10*3/uL — ABNORMAL HIGH (ref 4.0–10.5)
nRBC: 0 % (ref 0.0–0.2)

## 2019-10-29 LAB — BASIC METABOLIC PANEL
Anion gap: 10 (ref 5–15)
BUN: 21 mg/dL (ref 8–23)
CO2: 31 mmol/L (ref 22–32)
Calcium: 8.8 mg/dL — ABNORMAL LOW (ref 8.9–10.3)
Chloride: 100 mmol/L (ref 98–111)
Creatinine, Ser: 1.17 mg/dL (ref 0.61–1.24)
GFR calc Af Amer: >60 mL/min (ref >60)
GFR calc non Af Amer: 58 mL/min — ABNORMAL LOW (ref >60)
Glucose, Bld: 97 mg/dL (ref 70–99)
Potassium: 3.1 mmol/L — ABNORMAL LOW (ref 3.5–5.1)
Sodium: 141 mmol/L (ref 135–145)

## 2019-10-29 MED ORDER — LIDOCAINE 5 % EX PTCH
1.0000 | MEDICATED_PATCH | CUTANEOUS | Status: DC
  Administered 2019-10-29 – 2019-10-31 (×3): 1 via TRANSDERMAL
  Filled 2019-10-29 (×3): qty 1

## 2019-10-29 MED ORDER — POTASSIUM CHLORIDE CRYS ER 20 MEQ PO TBCR
40.0000 meq | EXTENDED_RELEASE_TABLET | Freq: Once | ORAL | Status: AC
  Administered 2019-10-29: 40 meq via ORAL
  Filled 2019-10-29: qty 2

## 2019-10-29 MED ORDER — POTASSIUM CHLORIDE ER 10 MEQ PO TBCR
10.0000 meq | EXTENDED_RELEASE_TABLET | Freq: Every day | ORAL | Status: DC | PRN
  Filled 2019-10-29: qty 1

## 2019-10-29 NOTE — Progress Notes (Addendum)
Progress Note    Chris Braun  CVE:[id number] DOB: 03/27/1935  DOA: 10/27/2019 PCP: [name]., MD (Inactive)    Brief Narrative:     Medical records reviewed and are as summarized below:  Chris Braun is an 84 y.o. male with medical history significant of severe osteoporosis with multiple thoracic spine compression fractures T7, T8, T9, T12 status post multiple back surgeries and the most recent one was in 2020, CAD with stents, asthma/COPD,  dementia, presented with acute back pain and worsening of ambulation function.  Patient has been using walker to walk and very unsteady since he had stroke.  But according to patient wife patient never had fall at home.  And patient has severe osteoporosis and has been using intranasal calcitonin sprays and vitamin D and calcium supplement.  2 days ago, patient twisted his back while in bathroom, immediately patient started to feel severe back pain.  Found to have compression fracture and needs pain control and placement in SNF for rehab.  Assessment/Plan:   Active Problems:   Lumbar vertebral fracture, pathologic   Compression fracture of L2 lumbar vertebra, closed, initial encounter ([place])   Acute ambulatory dysfunction due to new onset of pathological fracture of L1 and L2, from severe osteoporosis, no symptoms signs of cord compression or equina syndrome. -Poor candidate for surgical intervention given severe osteoporosis and high risk of nonhealing -Pain control with scheduled tylenol and prn tramadol (may have to schedule tramadol since he did not get at all yesterday) -robaxin scheduled -PT/OT -brace ordered -? SNF placement -add lidocaine patch  Acute hypoxic respiratory failure secondary to asthma/COPD exacerbation:EMS reported that his O2 was 78% on RA  -Likely from not using his home breathing meds  -Wife reported the patient has limited exercise tolerance, walk of 1 to 2 minutes can initiate short of breath.  No formal lung  function test before. -Resume home breathing meds, add long-acting beta agonist, short course p.o. steroid  Bradycardia -reduce BB for now with close monitoring on tele (follows with EP for arrhythmia)  History of CAD -Continue aspirin Plavix -Outpatient cardiology follow-up   dementia -Continue Aricept  Osteoporosis -As above  Hypokalemia -replete daily  NO note from care management for d/c planning so will place yet another consult   Family Communication/Anticipated D/C date and plan/Code Status   DVT prophylaxis: Lovenox ordered. Code Status: Full Code.  Disposition Plan: Status is: Inpatient LM for wife 9/11 Remains inpatient appropriate because:Inpatient level of care appropriate due to severity of illness   Dispo: The patient is from: Home              Anticipated d/c is to: SNF              Anticipated d/c date is: 2 days              Patient currently is not medically stable to d/c.- needs pain controlled and safe d/c plan         Medical Consultants:    None.     Subjective:   Pain with movement but says he is breathing better  Objective:    Vitals:   10/28/19 0738 10/28/19 1347 10/28/19 1900 10/29/19 0419  BP: 133/80 (!) 152/80 130/77 128/69  Pulse: 64 64 74 70  Resp: 15 15 17 17   Temp: 97.9 F (36.6 C) 98.3 F (36.8 C) 98 F (36.7 C) 98.8 F (37.1 C)  TempSrc: Oral Oral Oral Oral  SpO2: 95%  93% 93% 95%  Weight:      Height:        Intake/Output Summary (Last 24 hours) at 10/29/2019 0943 Last data filed at 10/28/2019 1500 Gross per 24 hour  Intake 120 ml  Output 150 ml  Net -30 ml   Filed Weights   10/27/19 1227  Weight: 78 kg    Exam:   General: Appearance:     Overweight male in no acute distress   Brace underneath patient but not properly on  Lungs:     respirations unlabored, no wheezing  Heart:    Normal heart rate   MS:   All extremities are intact. Moves all 4  Neurologic:   Awake, alert    Data  Reviewed:   I have personally reviewed following labs and imaging studies:  Labs: Labs show the following:   Basic Metabolic Panel: Recent Labs  Lab 10/27/19 1221 10/27/19 1221 10/27/19 1302 10/29/19 0703  NA 143  --  145 141  K 3.3*   < > 3.3* 3.1*  CL 105  --   --  100  CO2 27  --   --  31  GLUCOSE 108*  --   --  97  BUN 14  --   --  21  CREATININE 1.18  --   --  1.17  CALCIUM 9.2  --   --  8.8*   < > = values in this interval not displayed.   GFR Estimated Creatinine Clearance: 46.3 mL/min (by C-G formula based on SCr of 1.17 mg/dL). Liver Function Tests: No results for input(s): AST, ALT, ALKPHOS, BILITOT, PROT, ALBUMIN in the last 168 hours. No results for input(s): LIPASE, AMYLASE in the last 168 hours. No results for input(s): AMMONIA in the last 168 hours. Coagulation profile No results for input(s): INR, PROTIME in the last 168 hours.  CBC: Recent Labs  Lab 10/27/19 1221 10/27/19 1302 10/29/19 0703  WBC 12.3*  --  10.6*  NEUTROABS 9.4*  --   --   HGB 14.4 14.3 12.6*  HCT 44.6 42.0 36.5*  MCV 94.1  --  89.2  PLT 145*  --  153   Cardiac Enzymes: No results for input(s): CKTOTAL, CKMB, CKMBINDEX, TROPONINI in the last 168 hours. BNP (last 3 results) No results for input(s): PROBNP in the last 8760 hours. CBG: No results for input(s): GLUCAP in the last 168 hours. D-Dimer: No results for input(s): DDIMER in the last 72 hours. Hgb A1c: No results for input(s): HGBA1C in the last 72 hours. Lipid Profile: No results for input(s): CHOL, HDL, LDLCALC, TRIG, CHOLHDL, LDLDIRECT in the last 72 hours. Thyroid function studies: No results for input(s): TSH, T4TOTAL, T3FREE, THYROIDAB in the last 72 hours.  Invalid input(s): FREET3 Anemia work up: No results for input(s): VITAMINB12, FOLATE, FERRITIN, TIBC, IRON, RETICCTPCT in the last 72 hours. Sepsis Labs: Recent Labs  Lab 10/27/19 1221 10/29/19 0703  WBC 12.3* 10.6*    Microbiology Recent Results  (from the past 240 hour(s))  SARS Coronavirus 2 by RT PCR (hospital order, performed in [facility] hospital lab) Nasopharyngeal Nasopharyngeal Swab     Status: None   Collection Time: 10/27/19 12:27 PM   Specimen: Nasopharyngeal Swab  Result Value Ref Range Status   SARS Coronavirus 2 NEGATIVE NEGATIVE Final    Comment: (NOTE) SARS-CoV-2 target nucleic acids are NOT DETECTED.  The SARS-CoV-2 RNA is generally detectable in upper and lower respiratory specimens during the acute phase of infection. The  lowest concentration of SARS-CoV-2 viral copies this assay can detect is 250 copies / mL. A negative result does not preclude SARS-CoV-2 infection and should not be used as the sole basis for treatment or other patient management decisions.  A negative result may occur with improper specimen collection / handling, submission of specimen other than nasopharyngeal swab, presence of viral mutation(s) within the areas targeted by this assay, and inadequate number of viral copies (<250 copies / mL). A negative result must be combined with clinical observations, patient history, and epidemiological information.  Fact Sheet for Patients:   StrictlyIdeas.no  Fact Sheet for Healthcare Providers: BankingDealers.co.za  This test is not yet approved or  cleared by the [place] FDA and has been authorized for detection and/or diagnosis of SARS-CoV-2 by FDA under an Emergency Use Authorization (EUA).  This EUA will remain in effect (meaning this test can be used) for the duration of the COVID-19 declaration under Section 564(b)(1) of the Act, 21 U.S.C. section 360bbb-3(b)(1), unless the authorization is terminated or revoked sooner.  Performed at [facility], [place] [address] [place], [place] [address]     Procedures and diagnostic studies:  DG Chest 1 View  Result Date: 10/27/2019 CLINICAL DATA:  Hypoxia, back pain EXAM: CHEST  1 VIEW  COMPARISON:  06/07/2018, CT chest, 08/26/2027 FINDINGS: Low volume AP portable examination. Generally unchanged appearance of emphysematous change and fibrosis, better assessed by prior CT. No acute appearing airspace opacity. The heart and mediastinum are unremarkable. IMPRESSION: Low volume AP portable examination. Generally unchanged appearance of emphysematous change and fibrosis, better assessed by prior CT. No acute appearing airspace opacity. Electronically Signed   By: Eddie Candle M.D.   On: 10/27/2019 11:47   DG Lumbar Spine Complete  Result Date: 10/27/2019 CLINICAL DATA:  Hypoxia, twisted back with lower back pain EXAM: LUMBAR SPINE - COMPLETE 4+ VIEW COMPARISON:  Lumbar spine evaluations from 2014, no recent comparison evaluations. FINDINGS: Signs of cement augmentation at the T12 level. Not seen on previous lumbar spine radiographs but partially imaged on a prior intraoperative radiograph for stent placement was performed on May 30, 2018. This area was not well assessed on the previous radiograph and there is kyphotic deformity at this level with posterior translation/retrolisthesis of T12 on L1 approximately 1 cm. Five lumbar type vertebral bodies are demonstrated. There is also loss of height at the L1 and L2 levels compared to previous imaging, these are age indeterminate. Spinal degenerative changes are noted. Post L3 through L5 spinal fusion with interbody cage placement at the L3-4 level. Degenerative changes in the spine. Signs of L5 compression fracture which is similar to previous imaging. IMPRESSION: 1. Age indeterminate compression fractures of L1 and L2. 2. Signs of cement augmentation at T12 with kyphosis and retrolisthesis of T12 on L1. No comparison for spinal alignment is available. Correlate with any point tenderness at the thoracolumbar junction and upper lumbar spine to determine whether any of these findings may be acute or associated with acute worsening of existing fractures.  3. Post L3 through L5 spinal fusion with interbody cage placement at the L3-4 level. Electronically Signed   By: Zetta Bills M.D.   On: 10/27/2019 11:53    Medications:   . acetaminophen  1,000 mg Oral TID  . aspirin EC  81 mg Oral Daily  . atorvastatin  80 mg Oral Daily  . benzonatate  200 mg Oral QHS  . calcitonin (salmon)  1 spray Alternating Nares Daily  .  cholecalciferol  1,000 Units Oral Daily  . clopidogrel  75 mg Oral Daily  . docusate sodium  100 mg Oral BID  . donepezil  5 mg Oral QHS  . enoxaparin (LOVENOX) injection  40 mg Subcutaneous Q24H  . fluticasone furoate-vilanterol  1 puff Inhalation Daily  . furosemide  20 mg Oral Daily  . guaiFENesin  600 mg Oral BID  . ipratropium-albuterol  3 mL Nebulization QID  . loratadine  10 mg Oral Daily  . methocarbamol  500 mg Oral TID  . metoprolol succinate  25 mg Oral Daily  . montelukast  10 mg Oral QHS  . multivitamin with minerals  1 tablet Oral Daily  . pantoprazole  40 mg Oral Daily  . polyethylene glycol  17 g Oral Daily  . potassium chloride  40 mEq Oral Once  . predniSONE  40 mg Oral Q breakfast  . rOPINIRole  0.5 mg Oral TID  . tamsulosin  0.4 mg Oral QPC supper  . vitamin B-12  2,500 mcg Oral Daily   Continuous Infusions:   LOS: 2 days   Geradine Girt  [facility]   How to contact the [facility] Attending or Consulting provider [place] or covering provider during after hours [place], for this patient?  1. Check the care team in [facility] and look for a) attending/consulting TRH provider listed and b) the [facility] team listed 2. Log into [email or web address] and use [place]'s universal password to access. If you do not have the password, please contact the hospital operator. 3. Locate the [facility] provider you are looking for under [facility] and page to a number that you can be directly reached. 4. If you still have difficulty reaching the provider, please page the [facility] (Director on Call) for the Hospitalists listed on  amion for assistance.  10/29/2019, 9:43 AM

## 2019-10-29 NOTE — Plan of Care (Signed)

## 2019-10-29 NOTE — Evaluation (Signed)
Occupational Therapy Evaluation Patient Details Name: Chris Braun MRN: [id number] DOB: 1935/03/22 Today's Date: 10/29/2019    History of Present Illness Pt is an 84 y/o male admitted secondary to increased back pain. Pt was unable to walk. Found to have L1-2 compression fx. Recommending TLSO. PMH includes asthma, HTN, CAD, COPD, osteoporosis and CVA.    Clinical Impression   This 84 y/o male presents with the above. PTA pt using RW for mobility and receiving intermittent assist from spouse for ADL tasks. Pt pleasant and agreeable to participate in therapy session, though with notable limitations due to pain at this time. Pt requiring two person assist for bed mobility, rolling to remove excess bad pads and TLSO brace (brace under patient in bed but not donned). He requires up to [facility] for LB ADL. Pt to benefit from continued acute OT services and currently recommend follow up therapy services in SNF setting to progress his overall safety and independence with ADL and mobility.     Follow Up Recommendations  SNF    Equipment Recommendations  Other (comment) (TBD)           Precautions / Restrictions Precautions Precautions: Fall Precaution Comments: Pt's wife reports pt has frequent falls.  Required Braces or Orthoses: Spinal Brace Spinal Brace: Thoracolumbosacral orthotic Restrictions Weight Bearing Restrictions: No      Mobility Bed Mobility Overal bed mobility: Needs Assistance Bed Mobility: Rolling Rolling: Max assist;+2 for physical assistance         General bed mobility comments: multimodal cues for LE/UE placement, significant assist for rolling to L/R for placing of lidocaine patch and for removing excess bed pads   Transfers                 General transfer comment: unable to attempt given pain     Balance                                           ADL either performed or assessed with clinical judgement   ADL Overall ADL's :  Needs assistance/impaired Eating/Feeding: Set up;Sitting   Grooming: Set up;Min guard;Bed level   Upper Body Bathing: Minimal assistance;Sitting   Lower Body Bathing: Maximal assistance;Total assistance       Lower Body Dressing: Total assistance;Bed level                 General ADL Comments: pt only able to tolerate bed mobility and repositioning this session given pain, spouse present and also discouraging of mobilizing given pt's pain (attempted to educate on importance of mobility to work through and hopeful reduce pain/further stiffness)                          Pertinent Vitals/Pain Pain Assessment: Faces Faces Pain Scale: Hurts whole lot Pain Location: back  Pain Descriptors / Indicators: Aching;Guarding;Grimacing Pain Intervention(s): Limited activity within patient's tolerance;Monitored during session;Repositioned     Hand Dominance     Extremity/Trunk Assessment Upper Extremity Assessment Upper Extremity Assessment: Generalized weakness   Lower Extremity Assessment Lower Extremity Assessment: Defer to PT evaluation   Cervical / Trunk Assessment Cervical / Trunk Assessment: Other exceptions Cervical / Trunk Exceptions: Lumbar compression fxs   Communication Communication Communication: No difficulties   Cognition Arousal/Alertness: Awake/alert Behavior During Therapy: WFL for tasks assessed/performed Overall Cognitive Status: Impaired/Different from baseline Area of  Impairment: Safety/judgement;Problem solving                         Safety/Judgement: Decreased awareness of deficits;Decreased awareness of safety   Problem Solving: Slow processing General Comments: pt with legs over EOB upon arrival (despite having signifcant back pain with mobility), requires multimodal cues for command follow throughout   General Comments       Exercises     Shoulder Instructions      Home Living Family/patient expects to be discharged  to:: Private residence Living Arrangements: Spouse/significant other Available Help at Discharge: Family Type of Home: House Home Access: Stairs to enter Technical brewer of Steps: 2-3 Entrance Stairs-Rails: None Home Layout: One level     Bathroom Shower/Tub: Teacher, early years/pre: Standard     Home Equipment: Clinical cytogeneticist - 2 wheels          Prior Functioning/Environment Level of Independence: Independent with assistive device(s);Needs assistance    ADL's / Homemaking Assistance Needed: spouse assisting some with bathing and dressing    Comments: Reports pt used RW, but had frequent falls.         OT Problem List: Decreased strength;Decreased range of motion;Decreased activity tolerance;Impaired balance (sitting and/or standing);Decreased cognition;Decreased safety awareness;Decreased knowledge of use of DME or AE;Decreased knowledge of precautions;Pain      OT Treatment/Interventions: Self-care/ADL training;Therapeutic exercise;Energy conservation;DME and/or AE instruction;Therapeutic activities;Patient/family education;Balance training    OT Goals(Current goals can be found in the care plan section) Acute Rehab OT Goals Patient Stated Goal: to make sure pt can walk before going home OT Goal Formulation: With patient/family Time For Goal Achievement: 11/12/19 Potential to Achieve Goals: Good  OT Frequency: Min 2X/week   Barriers to D/C:            Co-evaluation              AM-PAC OT "6 Clicks" Daily Activity     Outcome Measure Help from another person eating meals?: A Little Help from another person taking care of personal grooming?: A Little Help from another person toileting, which includes using toliet, bedpan, or urinal?: Total Help from another person bathing (including washing, rinsing, drying)?: A Lot Help from another person to put on and taking off regular upper body clothing?: A Lot Help from another person to put on  and taking off regular lower body clothing?: Total 6 Click Score: 12   End of Session Nurse Communication: Mobility status  Activity Tolerance: Patient limited by pain Patient left: in bed;with call bell/phone within reach;with bed alarm set;with family/visitor present  OT Visit Diagnosis: Other abnormalities of gait and mobility (R26.89);History of falling (Z91.81);Pain Pain - part of body:  (back)                Time: 8563-1497 OT Time Calculation (min): 22 min Charges:  OT General Charges $OT Visit: 1 Visit OT Evaluation $OT Eval Moderate Complexity: 1 Mod  Lou Cal, OT Acute Rehabilitation Services Pager [phone] Office [phone]   Raymondo Band 10/29/2019, 2:41 PM

## 2019-10-29 NOTE — Progress Notes (Signed)
Occupational Therapy Treatment Patient Details Name: Chris Braun MRN: [id number] DOB: Aug 05, 1935 Today's Date: 10/29/2019    History of present illness Pt is an 84 y/o male admitted secondary to increased back pain. Pt was unable to walk. Found to have L1-2 compression fx. Recommending TLSO. PMH includes asthma, HTN, CAD, COPD, osteoporosis and CVA.    OT comments  Pt seen for additional treatment session to attempt further mobility beyond bed level. Pt pre-medicated this session, upon arrival pt with LEs over EOB (found in similar fashion on initial visit), with initial assist provided to reposition in bed. Pt then performing bed mobility with maxA, requiring up to modA (+2) to stand at [facility] and pt able to take few steps to recliner (approx 5') with minA+2. Pt continues to have limitations due to back pain but tolerating increased activity this session. Pt requiring maxA for brace management and up to [facility] for LB ADL. Pt also with limitations due to impaired cognition, requiring multimodal cues for sequencing, motor planning and for safe RW use throughout. Feel SNF recommendation remains appropriate at this time. Will continue to follow while acutely admitted.    Follow Up Recommendations  SNF    Equipment Recommendations  Other (comment) (TBD)          Precautions / Restrictions Precautions Precautions: Fall Precaution Comments: Pt's wife reports pt has frequent falls.  Required Braces or Orthoses: Spinal Brace Spinal Brace: Thoracolumbosacral orthotic Restrictions Weight Bearing Restrictions: No       Mobility Bed Mobility Overal bed mobility: Needs Assistance Bed Mobility: Rolling;Sidelying to Sit Rolling: Max assist Sidelying to sit: Max assist       General bed mobility comments: multimodal cues throughout, assist for LEs over EOB and trunk elevation   Transfers Overall transfer level: Needs assistance Equipment used: Rolling walker (2 wheeled) Transfers: Sit  to/from Stand Sit to Stand: Mod assist;+2 physical assistance;+2 safety/equipment         General transfer comment: boosting assist to rise and steady at RW, multimodal cues to initiate task; multimodal cues for safety with RW when taking steps to recliner     Balance Overall balance assessment: Needs assistance;History of Falls Sitting-balance support: Feet supported Sitting balance-Leahy Scale: Fair     Standing balance support: Bilateral upper extremity supported Standing balance-Leahy Scale: Poor Standing balance comment: reliant on external assist                            ADL either performed or assessed with clinical judgement   ADL Overall ADL's : Needs assistance/impaired Eating/Feeding: Set up;Sitting   Grooming: Set up;Min guard;Bed level   Upper Body Bathing: Minimal assistance;Sitting   Lower Body Bathing: Maximal assistance;Total assistance   Upper Body Dressing : Maximal assistance;Sitting Upper Body Dressing Details (indicate cue type and reason): for donning TSLO while seated EOB  Lower Body Dressing: Total assistance;Bed level               Functional mobility during ADLs: Moderate assistance;+2 for physical assistance;Rolling walker (to stand, minA+2 for short distance mobility ) General ADL Comments: pt seen for additional session at request of MD to attempt mobilizing further, pt premedicated and tolerating increased distance/activity                        Cognition Arousal/Alertness: Awake/alert Behavior During Therapy: WFL for tasks assessed/performed Overall Cognitive Status: Impaired/Different from baseline Area of Impairment: Safety/judgement;Problem solving  Safety/Judgement: Decreased awareness of deficits;Decreased awareness of safety   Problem Solving: Slow processing General Comments: pt with legs over EOB upon arrival once again (despite having signifcant back pain with  mobility), requires multimodal cues for command follow throughout as well as cues for safety including RW use         Exercises     Shoulder Instructions       General Comments spouse prsent during session     Pertinent Vitals/ Pain       Pain Assessment: Faces Faces Pain Scale: Hurts even more Pain Location: back, with certain movements Pain Descriptors / Indicators: Aching;Guarding;Grimacing Pain Intervention(s): Limited activity within patient's tolerance;Monitored during session;Premedicated before session;Repositioned  Home Living Family/patient expects to be discharged to:: Private residence Living Arrangements: Spouse/significant other Available Help at Discharge: Family Type of Home: House Home Access: Stairs to enter Technical brewer of Steps: 2-3 Entrance Stairs-Rails: None Home Layout: One level     Bathroom Shower/Tub: Teacher, early years/pre: Standard     Home Equipment: Clinical cytogeneticist - 2 wheels          Prior Functioning/Environment Level of Independence: Independent with assistive device(s);Needs assistance    ADL's / Homemaking Assistance Needed: spouse assisting some with bathing and dressing    Comments: Reports pt used RW, but had frequent falls.    Frequency  Min 2X/week        Progress Toward Goals  OT Goals(current goals can now be found in the care plan section)  Progress towards OT goals: Progressing toward goals  Acute Rehab OT Goals Patient Stated Goal: to make sure pt can walk before going home OT Goal Formulation: With patient/family Time For Goal Achievement: 11/12/19 Potential to Achieve Goals: Good ADL Goals Pt Will Perform Grooming: with supervision;sitting Pt Will Perform Lower Body Bathing: with min assist;sit to/from stand Pt Will Perform Upper Body Dressing: with min assist;sitting Pt Will Perform Lower Body Dressing: with min assist;sit to/from stand Pt Will Transfer to Toilet: with min  assist;ambulating Pt Will Perform Toileting - Clothing Manipulation and hygiene: with min assist;sit to/from stand Additional ADL Goal #1: Pt will perform bed mobility with minA as precursor to EOB/OOB ADL.  Plan Discharge plan remains appropriate    Co-evaluation                 AM-PAC OT "6 Clicks" Daily Activity     Outcome Measure   Help from another person eating meals?: A Little Help from another person taking care of personal grooming?: A Little Help from another person toileting, which includes using toliet, bedpan, or urinal?: Total Help from another person bathing (including washing, rinsing, drying)?: A Lot Help from another person to put on and taking off regular upper body clothing?: A Lot Help from another person to put on and taking off regular lower body clothing?: Total 6 Click Score: 12    End of Session Equipment Utilized During Treatment: Gait belt;Rolling walker;Back brace  OT Visit Diagnosis: Other abnormalities of gait and mobility (R26.89);History of falling (Z91.81);Pain Pain - part of body:  (back)   Activity Tolerance Patient tolerated treatment well   Patient Left with family/visitor present;in chair;with chair alarm set;with call bell/phone within reach   Nurse Communication Mobility status        Time: 6644-0347 OT Time Calculation (min): 23 min  Charges: OT General Charges $OT Visit: 1 Visit  OT Treatments $Self Care/Home Management : 23-37 mins  Lou Cal, OT Acute Rehabilitation  Services Pager [phone] Office [phone]    Raymondo Band 10/29/2019, 2:50 PM

## 2019-10-30 LAB — CBC
HCT: 44.8 % (ref 39.0–52.0)
Hemoglobin: 15.4 g/dL (ref 13.0–17.0)
MCH: 30.5 pg (ref 26.0–34.0)
MCHC: 34.4 g/dL (ref 30.0–36.0)
MCV: 88.7 fL (ref 80.0–100.0)
Platelets: 203 10*3/uL (ref 150–400)
RBC: 5.05 MIL/uL (ref 4.22–5.81)
RDW: 13.1 % (ref 11.5–15.5)
WBC: 11.1 10*3/uL — ABNORMAL HIGH (ref 4.0–10.5)
nRBC: 0 % (ref 0.0–0.2)

## 2019-10-30 LAB — BASIC METABOLIC PANEL
Anion gap: 11 (ref 5–15)
BUN: 17 mg/dL (ref 8–23)
CO2: 28 mmol/L (ref 22–32)
Calcium: 9.1 mg/dL (ref 8.9–10.3)
Chloride: 99 mmol/L (ref 98–111)
Creatinine, Ser: 1.17 mg/dL (ref 0.61–1.24)
GFR calc Af Amer: >60 mL/min (ref >60)
GFR calc non Af Amer: 58 mL/min — ABNORMAL LOW (ref >60)
Glucose, Bld: 105 mg/dL — ABNORMAL HIGH (ref 70–99)
Potassium: 4.2 mmol/L (ref 3.5–5.1)
Sodium: 138 mmol/L (ref 135–145)

## 2019-10-30 MED ORDER — POTASSIUM CHLORIDE CRYS ER 20 MEQ PO TBCR
40.0000 meq | EXTENDED_RELEASE_TABLET | Freq: Once | ORAL | Status: AC
  Administered 2019-10-30: 40 meq via ORAL
  Filled 2019-10-30: qty 2

## 2019-10-30 MED ORDER — TRAMADOL HCL 50 MG PO TABS: 50.0000 mg | ORAL_TABLET | Freq: Four times a day (QID) | ORAL | 0 refills | Status: AC | PRN

## 2019-10-30 MED ORDER — TRAZODONE HCL 50 MG PO TABS
25.0000 mg | ORAL_TABLET | Freq: Every day | ORAL | Status: DC
  Administered 2019-10-30: 25 mg via ORAL
  Filled 2019-10-30: qty 1

## 2019-10-30 MED ORDER — METOPROLOL TARTRATE 5 MG/5ML IV SOLN
5.0000 mg | INTRAVENOUS | Status: DC | PRN
  Administered 2019-10-30: 5 mg via INTRAVENOUS
  Filled 2019-10-30 (×2): qty 5

## 2019-10-30 MED ORDER — IPRATROPIUM-ALBUTEROL 0.5-2.5 (3) MG/3ML IN SOLN: 3.0000 mL | Freq: Four times a day (QID) | RESPIRATORY_TRACT | Status: DC | PRN

## 2019-10-30 MED ORDER — METOPROLOL SUCCINATE ER 50 MG PO TB24
50.0000 mg | ORAL_TABLET | Freq: Every day | ORAL | Status: DC
  Administered 2019-10-30: 50 mg via ORAL

## 2019-10-30 MED ORDER — METOPROLOL TARTRATE 5 MG/5ML IV SOLN
5.0000 mg | Freq: Once | INTRAVENOUS | Status: AC
  Administered 2019-10-30: 5 mg via INTRAVENOUS
  Filled 2019-10-30: qty 5

## 2019-10-30 NOTE — Progress Notes (Signed)
   10/30/19 1200  Assess: MEWS Score  Temp 97.9 F (36.6 C)  BP 117/85  Pulse Rate (!) 126  Resp 16  Level of Consciousness Alert  SpO2 96 %  O2 Device Nasal Cannula  Patient Activity (if Appropriate) In bed  O2 Flow Rate (L/min) 2 L/min  Assess: MEWS Score  MEWS Temp 0  MEWS Systolic 0  MEWS Pulse 2  MEWS RR 0  MEWS LOC 0  MEWS Score 2  MEWS Score Color Yellow   Yellow MEWS protocol initiated d/t tachcardia, attending MD notified, post void residual checked (177ml). Will continue to monitor patient per MD order.

## 2019-10-30 NOTE — Plan of Care (Signed)
  Problem: Education: Goal: Knowledge of General Education information will improve Description: Including pain rating scale, medication(s)/side effects and non-pharmacologic comfort measures Outcome: Progressing   Problem: Health Behavior/Discharge Planning: Goal: Ability to manage health-related needs will improve Outcome: Progressing   Problem: Clinical Measurements: Goal: Ability to maintain clinical measurements within normal limits will improve Outcome: Progressing Goal: Will remain free from infection Outcome: Progressing Goal: Diagnostic test results will improve Outcome: Progressing Goal: Respiratory complications will improve Outcome: Progressing Goal: Cardiovascular complication will be avoided Outcome: Progressing   Problem: Activity: Goal: Risk for activity intolerance will decrease Outcome: Progressing   Problem: Nutrition: Goal: Adequate nutrition will be maintained Outcome: Progressing   Problem: Coping: Goal: Level of anxiety will decrease Outcome: Progressing   Problem: Elimination: Goal: Will not experience complications related to bowel motility Outcome: Progressing Goal: Will not experience complications related to urinary retention Outcome: Progressing   Problem: Pain Managment: Goal: General experience of comfort will improve Outcome: Progressing   Problem: Safety: Goal: Ability to remain free from injury will improve Outcome: Progressing   Problem: Skin Integrity: Goal: Risk for impaired skin integrity will decrease Outcome: Progressing  Patient in yellow MEWs d/t tachycardia, metoprolol x1 dose given but HR not improved MD notified EKG done and placed in chart per MD order.

## 2019-10-30 NOTE — Progress Notes (Signed)
Paged [facility] about pt's sustained HR in the 140s. Awaiting orders.

## 2019-10-30 NOTE — Progress Notes (Signed)
Progress Note    Chris Braun  OAC:[id number] DOB: 27-Dec-1935  DOA: 10/27/2019 PCP: [name]., MD (Inactive)    Brief Narrative:     Medical records reviewed and are as summarized below:  Chris Braun is an 84 y.o. male with medical history significant of severe osteoporosis with multiple thoracic spine compression fractures T7, T8, T9, T12 status post multiple back surgeries and the most recent one was in 2020, CAD with stents, asthma/COPD,  dementia, presented with acute back pain and worsening of ambulation function.  Patient has been using walker to walk and very unsteady since he had stroke.  But according to patient wife patient never had fall at home.  And patient has severe osteoporosis and has been using intranasal calcitonin sprays and vitamin D and calcium supplement.  2 days ago, patient twisted his back while in bathroom, immediately patient started to feel severe back pain.  Found to have compression fracture and needs pain control and placement in SNF for rehab.  Assessment/Plan:   Active Problems:   Osteoporosis   Lumbar vertebral fracture, pathologic   Compression fracture of L2 lumbar vertebra, closed, initial encounter ([place])   Acute ambulatory dysfunction due to new onset of pathological fracture of L1 and L2, from severe osteoporosis, no symptoms signs of cord compression or equina syndrome. -Poor candidate for surgical intervention given severe osteoporosis and high risk of nonhealing -Pain control with scheduled tylenol and prn tramadol (may have to schedule tramadol since he did not get at all yesterday) -robaxin scheduled -PT/OT -brace ordered -? SNF placement -add lidocaine patch  Acute hypoxic respiratory failure secondary to asthma/COPD exacerbation:EMS reported that his O2 was 78% on RA  -Likely from not using his home breathing meds  -Wife reported the patient has limited exercise tolerance, walk of 1 to 2 minutes can initiate short of breath.   No formal lung function test before. -Resume home breathing meds, add long-acting beta agonist, short course p.o. steroid  Bradycardia -resolved into sinus tach-- increase BB back up  History of CAD -Continue aspirin/Plavix -Outpatient cardiology follow-up   dementia -Continue Aricept  Osteoporosis -As above  Hypokalemia -repleted  Stable to d/c to SNF when bed available   Family Communication/Anticipated D/C date and plan/Code Status   DVT prophylaxis: Lovenox ordered. Code Status: Full Code.  Disposition Plan: Status is: Inpatient LM for wife 9/11 Remains inpatient appropriate because:Inpatient level of care appropriate due to severity of illness   Dispo: The patient is from: Home              Anticipated d/c is to: SNF              Anticipated d/c date is: 2 days              Patient currently Is medically ready-- needs SNF placement         Medical Consultants:    None.     Subjective:   Had some episodes of worsening confusion overnight  Objective:    Vitals:   10/29/19 1414 10/29/19 1942 10/30/19 0625 10/30/19 1200  BP: (!) 156/65 (!) 154/92 (!) 156/108 117/85  Pulse: 61 69 (!) 110 (!) 126  Resp:  16 18 16   Temp: 97.8 F (36.6 C) (!) 97.5 F (36.4 C) 97.7 F (36.5 C) 97.9 F (36.6 C)  TempSrc: Oral Oral Oral Oral  SpO2: 91% 94% 99% 96%  Weight:      Height:  Intake/Output Summary (Last 24 hours) at 10/30/2019 1313 Last data filed at 10/30/2019 0745 Gross per 24 hour  Intake 100 ml  Output 150 ml  Net -50 ml   Filed Weights   10/27/19 1227  Weight: 78 kg    Exam:   General: Appearance:     Overweight male in no acute distress- being fed breakfast     Lungs:     No wheezing, respirations unlabored  Heart:    Tachycardic. sinus  MS:   All extremities are intact.   Neurologic:   Awake, sleepy    Data Reviewed:   I have personally reviewed following labs and imaging studies:  Labs: Labs show the following:     Basic Metabolic Panel: Recent Labs  Lab 10/27/19 1221 10/27/19 1221 10/27/19 1302 10/29/19 0703  NA 143  --  145 141  K 3.3*   < > 3.3* 3.1*  CL 105  --   --  100  CO2 27  --   --  31  GLUCOSE 108*  --   --  97  BUN 14  --   --  21  CREATININE 1.18  --   --  1.17  CALCIUM 9.2  --   --  8.8*   < > = values in this interval not displayed.   GFR Estimated Creatinine Clearance: 46.3 mL/min (by C-G formula based on SCr of 1.17 mg/dL). Liver Function Tests: No results for input(s): AST, ALT, ALKPHOS, BILITOT, PROT, ALBUMIN in the last 168 hours. No results for input(s): LIPASE, AMYLASE in the last 168 hours. No results for input(s): AMMONIA in the last 168 hours. Coagulation profile No results for input(s): INR, PROTIME in the last 168 hours.  CBC: Recent Labs  Lab 10/27/19 1221 10/27/19 1302 10/29/19 0703  WBC 12.3*  --  10.6*  NEUTROABS 9.4*  --   --   HGB 14.4 14.3 12.6*  HCT 44.6 42.0 36.5*  MCV 94.1  --  89.2  PLT 145*  --  153   Cardiac Enzymes: No results for input(s): CKTOTAL, CKMB, CKMBINDEX, TROPONINI in the last 168 hours. BNP (last 3 results) No results for input(s): PROBNP in the last 8760 hours. CBG: No results for input(s): GLUCAP in the last 168 hours. D-Dimer: No results for input(s): DDIMER in the last 72 hours. Hgb A1c: No results for input(s): HGBA1C in the last 72 hours. Lipid Profile: No results for input(s): CHOL, HDL, LDLCALC, TRIG, CHOLHDL, LDLDIRECT in the last 72 hours. Thyroid function studies: No results for input(s): TSH, T4TOTAL, T3FREE, THYROIDAB in the last 72 hours.  Invalid input(s): FREET3 Anemia work up: No results for input(s): VITAMINB12, FOLATE, FERRITIN, TIBC, IRON, RETICCTPCT in the last 72 hours. Sepsis Labs: Recent Labs  Lab 10/27/19 1221 10/29/19 0703  WBC 12.3* 10.6*    Microbiology Recent Results (from the past 240 hour(s))  SARS Coronavirus 2 by RT PCR (hospital order, performed in [facility] hospital  lab) Nasopharyngeal Nasopharyngeal Swab     Status: None   Collection Time: 10/27/19 12:27 PM   Specimen: Nasopharyngeal Swab  Result Value Ref Range Status   SARS Coronavirus 2 NEGATIVE NEGATIVE Final    Comment: (NOTE) SARS-CoV-2 target nucleic acids are NOT DETECTED.  The SARS-CoV-2 RNA is generally detectable in upper and lower respiratory specimens during the acute phase of infection. The lowest concentration of SARS-CoV-2 viral copies this assay can detect is 250 copies / mL. A negative result does not preclude SARS-CoV-2 infection and  should not be used as the sole basis for treatment or other patient management decisions.  A negative result may occur with improper specimen collection / handling, submission of specimen other than nasopharyngeal swab, presence of viral mutation(s) within the areas targeted by this assay, and inadequate number of viral copies (<250 copies / mL). A negative result must be combined with clinical observations, patient history, and epidemiological information.  Fact Sheet for Patients:   StrictlyIdeas.no  Fact Sheet for Healthcare Providers: BankingDealers.co.za  This test is not yet approved or  cleared by the [place] FDA and has been authorized for detection and/or diagnosis of SARS-CoV-2 by FDA under an Emergency Use Authorization (EUA).  This EUA will remain in effect (meaning this test can be used) for the duration of the COVID-19 declaration under Section 564(b)(1) of the Act, 21 U.S.C. section 360bbb-3(b)(1), unless the authorization is terminated or revoked sooner.  Performed at [facility], [place] [address], [place], [place] [address]     Procedures and diagnostic studies:  No results found.  Medications:   . acetaminophen  1,000 mg Oral TID  . aspirin EC  81 mg Oral Daily  . atorvastatin  80 mg Oral Daily  . benzonatate  200 mg Oral QHS  . calcitonin (salmon)  1 spray  Alternating Nares Daily  . cholecalciferol  1,000 Units Oral Daily  . clopidogrel  75 mg Oral Daily  . docusate sodium  100 mg Oral BID  . donepezil  5 mg Oral QHS  . enoxaparin (LOVENOX) injection  40 mg Subcutaneous Q24H  . fluticasone furoate-vilanterol  1 puff Inhalation Daily  . furosemide  20 mg Oral Daily  . guaiFENesin  600 mg Oral BID  . ipratropium-albuterol  3 mL Nebulization QID  . lidocaine  1 patch Transdermal Q24H  . loratadine  10 mg Oral Daily  . methocarbamol  500 mg Oral TID  . metoprolol succinate  50 mg Oral Daily  . montelukast  10 mg Oral QHS  . multivitamin with minerals  1 tablet Oral Daily  . pantoprazole  40 mg Oral Daily  . polyethylene glycol  17 g Oral Daily  . potassium chloride  40 mEq Oral Once  . rOPINIRole  0.5 mg Oral TID  . tamsulosin  0.4 mg Oral QPC supper  . traZODone  25 mg Oral QHS  . vitamin B-12  2,500 mcg Oral Daily   Continuous Infusions:   LOS: 3 days   Geradine Girt  [facility]   How to contact the [facility] Attending or Consulting provider [place] or covering provider during after hours [place], for this patient?  1. Check the care team in [facility] and look for a) attending/consulting TR[facility] provider listed and b) the [facility] team listed 2. Log into [email or web address] and use [place]'s universal password to access. If you do not have the password, please contact the hospital operator. 3. Locate the [facility] provider you are looking for under [facility] and page to a number that you can be directly reached. 4. If you still have difficulty reaching the provider, please page the [facility] (Director on Call) for the Hospitalists listed on amion for assistance.  10/30/2019, 1:13 PM

## 2019-10-30 NOTE — Progress Notes (Signed)
Pt with mitts on, RN at the bedside. Pt fell asleep. No restraints on at this time.

## 2019-10-30 NOTE — Progress Notes (Signed)
Pt restless at this time. Requested Seroquel from [facility], to help patient rest. Awaiting for orders to be placed.

## 2019-10-31 DIAGNOSIS — Z4789 Encounter for other orthopedic aftercare: Secondary | ICD-10-CM | POA: Diagnosis not present

## 2019-10-31 DIAGNOSIS — M2550 Pain in unspecified joint: Secondary | ICD-10-CM | POA: Diagnosis not present

## 2019-10-31 DIAGNOSIS — E785 Hyperlipidemia, unspecified: Secondary | ICD-10-CM | POA: Diagnosis not present

## 2019-10-31 DIAGNOSIS — I498 Other specified cardiac arrhythmias: Secondary | ICD-10-CM | POA: Diagnosis not present

## 2019-10-31 DIAGNOSIS — R41841 Cognitive communication deficit: Secondary | ICD-10-CM | POA: Diagnosis not present

## 2019-10-31 DIAGNOSIS — M6281 Muscle weakness (generalized): Secondary | ICD-10-CM | POA: Diagnosis not present

## 2019-10-31 DIAGNOSIS — R5381 Other malaise: Secondary | ICD-10-CM | POA: Diagnosis not present

## 2019-10-31 DIAGNOSIS — R262 Difficulty in walking, not elsewhere classified: Secondary | ICD-10-CM | POA: Diagnosis not present

## 2019-10-31 DIAGNOSIS — J9601 Acute respiratory failure with hypoxia: Secondary | ICD-10-CM | POA: Diagnosis not present

## 2019-10-31 DIAGNOSIS — I10 Essential (primary) hypertension: Secondary | ICD-10-CM | POA: Diagnosis not present

## 2019-10-31 DIAGNOSIS — J449 Chronic obstructive pulmonary disease, unspecified: Secondary | ICD-10-CM | POA: Diagnosis not present

## 2019-10-31 DIAGNOSIS — Z8679 Personal history of other diseases of the circulatory system: Secondary | ICD-10-CM | POA: Diagnosis not present

## 2019-10-31 DIAGNOSIS — I2510 Atherosclerotic heart disease of native coronary artery without angina pectoris: Secondary | ICD-10-CM | POA: Diagnosis not present

## 2019-10-31 DIAGNOSIS — M8008XD Age-related osteoporosis with current pathological fracture, vertebra(e), subsequent encounter for fracture with routine healing: Secondary | ICD-10-CM | POA: Diagnosis not present

## 2019-10-31 DIAGNOSIS — R278 Other lack of coordination: Secondary | ICD-10-CM | POA: Diagnosis not present

## 2019-10-31 DIAGNOSIS — M549 Dorsalgia, unspecified: Secondary | ICD-10-CM | POA: Diagnosis not present

## 2019-10-31 DIAGNOSIS — Z7401 Bed confinement status: Secondary | ICD-10-CM | POA: Diagnosis not present

## 2019-10-31 DIAGNOSIS — Z9889 Other specified postprocedural states: Secondary | ICD-10-CM | POA: Diagnosis not present

## 2019-10-31 DIAGNOSIS — F0391 Unspecified dementia with behavioral disturbance: Secondary | ICD-10-CM | POA: Diagnosis not present

## 2019-10-31 DIAGNOSIS — F0390 Unspecified dementia without behavioral disturbance: Secondary | ICD-10-CM | POA: Diagnosis not present

## 2019-10-31 DIAGNOSIS — S32020A Wedge compression fracture of second lumbar vertebra, initial encounter for closed fracture: Secondary | ICD-10-CM | POA: Diagnosis not present

## 2019-10-31 DIAGNOSIS — S32029D Unspecified fracture of second lumbar vertebra, subsequent encounter for fracture with routine healing: Secondary | ICD-10-CM | POA: Diagnosis not present

## 2019-10-31 DIAGNOSIS — R2689 Other abnormalities of gait and mobility: Secondary | ICD-10-CM | POA: Diagnosis not present

## 2019-10-31 DIAGNOSIS — D649 Anemia, unspecified: Secondary | ICD-10-CM | POA: Diagnosis not present

## 2019-10-31 DIAGNOSIS — E876 Hypokalemia: Secondary | ICD-10-CM | POA: Diagnosis not present

## 2019-10-31 DIAGNOSIS — R1312 Dysphagia, oropharyngeal phase: Secondary | ICD-10-CM | POA: Diagnosis not present

## 2019-10-31 DIAGNOSIS — R0902 Hypoxemia: Secondary | ICD-10-CM | POA: Diagnosis not present

## 2019-10-31 DIAGNOSIS — K219 Gastro-esophageal reflux disease without esophagitis: Secondary | ICD-10-CM | POA: Diagnosis not present

## 2019-10-31 DIAGNOSIS — G309 Alzheimer's disease, unspecified: Secondary | ICD-10-CM | POA: Diagnosis not present

## 2019-10-31 DIAGNOSIS — R52 Pain, unspecified: Secondary | ICD-10-CM | POA: Diagnosis not present

## 2019-10-31 LAB — BASIC METABOLIC PANEL
Anion gap: 12 (ref 5–15)
BUN: 18 mg/dL (ref 8–23)
CO2: 29 mmol/L (ref 22–32)
Calcium: 9.1 mg/dL (ref 8.9–10.3)
Chloride: 97 mmol/L — ABNORMAL LOW (ref 98–111)
Creatinine, Ser: 1.24 mg/dL (ref 0.61–1.24)
GFR calc Af Amer: >60 mL/min (ref >60)
GFR calc non Af Amer: 54 mL/min — ABNORMAL LOW (ref >60)
Glucose, Bld: 86 mg/dL (ref 70–99)
Potassium: 4 mmol/L (ref 3.5–5.1)
Sodium: 138 mmol/L (ref 135–145)

## 2019-10-31 LAB — CBC
HCT: 47.8 % (ref 39.0–52.0)
Hemoglobin: 15.9 g/dL (ref 13.0–17.0)
MCH: 29.9 pg (ref 26.0–34.0)
MCHC: 33.3 g/dL (ref 30.0–36.0)
MCV: 90 fL (ref 80.0–100.0)
Platelets: 207 10*3/uL (ref 150–400)
RBC: 5.31 MIL/uL (ref 4.22–5.81)
RDW: 13.2 % (ref 11.5–15.5)
WBC: 10.5 10*3/uL (ref 4.0–10.5)
nRBC: 0 % (ref 0.0–0.2)

## 2019-10-31 LAB — MAGNESIUM: Magnesium: 1.8 mg/dL (ref 1.7–2.4)

## 2019-10-31 LAB — SARS CORONAVIRUS 2 BY RT PCR (HOSPITAL ORDER, PERFORMED IN ~~LOC~~ HOSPITAL LAB): SARS Coronavirus 2: NEGATIVE

## 2019-10-31 MED ORDER — DOCUSATE SODIUM 100 MG PO CAPS
100.0000 mg | ORAL_CAPSULE | Freq: Two times a day (BID) | ORAL | 0 refills | Status: DC
Start: 2019-10-31 — End: 2019-11-03

## 2019-10-31 MED ORDER — METOPROLOL SUCCINATE ER 50 MG PO TB24: 75.0000 mg | ORAL_TABLET | Freq: Every day | ORAL | Status: DC

## 2019-10-31 MED ORDER — LIDOCAINE 5 % EX PTCH: 1.0000 | MEDICATED_PATCH | CUTANEOUS | 0 refills | Status: DC

## 2019-10-31 MED ORDER — POTASSIUM CHLORIDE ER 10 MEQ PO TBCR: 10.0000 meq | EXTENDED_RELEASE_TABLET | Freq: Every day | ORAL | Status: DC | PRN

## 2019-10-31 MED ORDER — MAGNESIUM OXIDE 400 (241.3 MG) MG PO TABS
400.0000 mg | ORAL_TABLET | Freq: Once | ORAL | Status: AC
  Administered 2019-10-31: 400 mg via ORAL
  Filled 2019-10-31 (×3): qty 1

## 2019-10-31 MED ORDER — METOPROLOL SUCCINATE ER 50 MG PO TB24
50.0000 mg | ORAL_TABLET | Freq: Every day | ORAL | Status: DC
  Administered 2019-10-31: 50 mg via ORAL
  Filled 2019-10-31: qty 1

## 2019-10-31 MED ORDER — METHOCARBAMOL 500 MG PO TABS: 500.0000 mg | ORAL_TABLET | Freq: Three times a day (TID) | ORAL | Status: DC

## 2019-10-31 NOTE — TOC Transition Note (Signed)
Transition of Care [facility]) - CM/SW Discharge Note   Patient Details  Name: Chris Braun MRN: [id number] Date of Birth: [birth year]  [facility] CM/SW Contact:  Sharin Mons, RN Phone Number: 10/31/2019, 1:17 PM   Clinical Narrative:    Patient will DC to: [facility] [place] Anticipated DC date: 10/31/2019 Family notified: wife Transport by: Corey Harold   Per MD patient ready for DC today . RN, patient, patient's family, and facility notified of DC. Discharge Summary and FL2 sent to facility. RN to call report prior to discharge ([phone]). Rm # [phone]. DC packet on chart. Ambulance transport requested for patient.   RNCM will sign off for now as intervention is no longer needed. Please consult [place] again if new needs arise.    Final next level of care: [place] ([facility] ) Barriers to Discharge: No Barriers Identified   Patient Goals and CMS Choice        Discharge Placement                       Discharge Plan and Services                                     Social Determinants of Health (SDOH) Interventions     Readmission Risk Interventions No flowsheet data found.

## 2019-10-31 NOTE — Discharge Summary (Signed)
Physician Discharge Summary  Chris Braun:[id number] DOB: 1935-07-16 DOA: 10/27/2019  PCP: [name]., MD (Inactive)  Admit date: 10/27/2019 Discharge date: 10/31/2019  Admitted From: home Discharge disposition: SNF   Recommendations for Outpatient Follow-Up:   1. Wear brace when up 2. Bowel regimen 3. Incentive spirometry/flutter valve 4. Wean O2 to room air 5. Consider palliative care referral at SNF   Discharge Diagnosis:   Active Problems:   CAD (coronary artery disease)   Acute hypoxemic respiratory failure (HCC)   Osteoporosis   Atrial tachycardia (HCC)   Lumbar vertebral fracture, pathologic   Compression fracture of L2 lumbar vertebra, closed, initial encounter [facility])    Discharge Condition: Improved.  Diet recommendation: Low sodium, heart healthy  Wound care: None.  Code status: Full.   History of Present Illness:   Chris Braun is a 84 y.o. male with medical history significant of severe osteoporosis with multiple thoracic spine compression fractures T7, T8, T9, T12 status post multiple back surgeries and the most recent one was in 2020, CAD with stents, asthma/COPD, chronic ambulation dysfunction, stroke, Alzheimer's dementia, presented with acute back pain and worsening of ambulation function.  Patient has been using walker to walk and very unsteady since he had stroke.  But according to patient wife patient never had fall at home.  And patient has severe osteoporosis and has been using intranasal calcitonin sprays and vitamin D and calcium supplement.  2 days ago, patient twisted his back while in bathroom, immediately patient started to feel severe back pain.  He denied any leg pain no trouble urinate, and no change of bowel habit, and he denied any numbness of perineal area.  No significant changes of leg strength and no numbness of of his legs.  Because of severe back pain, patient has been unable to stand up and walk again since yesterday and  not able to use the inhaler for COPD/asthma because deep breathing and cough can cause more pain.  Wife called EMS this morning, when EMS arrived, they found patient O2 saturation in the 70s and patient was put on NRB.   Hospital Course by Problem:   Acute ambulatory dysfunction due to new onset of pathological fracture of L1 and L2,from severe osteoporosis,no symptoms signs of cord compression or equina syndrome. -Poor candidate for surgical intervention given severe osteoporosis and high risk of nonhealing -Pain control with scheduled tylenol and prn tramadol (may have to schedule tramadol since he did not get at all yesterday) -robaxin scheduled -PT/OT- SNF -brace ordered -add lidocaine patch  Acute hypoxic respiratory failure secondary to asthma/COPD exacerbation:EMS reported that his O2 was 78% on RA  -Likely from not using his home breathing meds  -Wife reported the patient has limited exercise tolerance, walk of 1 to 2 minutes can initiate short of breath.No formal lung function test before. -Resume home breathing meds, add long-acting beta agonist, short course p.o. steroid -flutter valve/incentive spirometry  Bradycardia -patient tolerates well would not decrease his BB as this causes sinus tachy  History of CAD -Continue aspirin/Plavix -Outpatient cardiology follow-up   dementia -Continue Aricept  Osteoporosis -As above  Hypokalemia -repleted    Medical Consultants:      Discharge Exam:   Vitals:   10/31/19 0810 10/31/19 0829  BP: 95/62   Pulse: 71   Resp: 14   Temp: 98 F (36.7 C)   SpO2: 97% 96%   Vitals:   10/31/19 0416 10/31/19 0800 10/31/19 0810 10/31/19 3149  BP: 120/74 108/70 95/62   Pulse: 72 80 71   Resp:  18 14   Temp: (!) 97.5 F (36.4 C) (!) 97.5 F (36.4 C) 98 F (36.7 C)   TempSrc: Oral Oral Oral   SpO2: 97% 98% 97% 96%  Weight:      Height:        General exam: Appears calm and comfortable.     The results of  significant diagnostics from this hospitalization (including imaging, microbiology, ancillary and laboratory) are listed below for reference.     Procedures and Diagnostic Studies:   DG Chest 1 View  Result Date: 10/27/2019 CLINICAL DATA:  Hypoxia, back pain EXAM: CHEST  1 VIEW COMPARISON:  06/07/2018, CT chest, 08/26/2027 FINDINGS: Low volume AP portable examination. Generally unchanged appearance of emphysematous change and fibrosis, better assessed by prior CT. No acute appearing airspace opacity. The heart and mediastinum are unremarkable. IMPRESSION: Low volume AP portable examination. Generally unchanged appearance of emphysematous change and fibrosis, better assessed by prior CT. No acute appearing airspace opacity. Electronically Signed   By: Eddie Candle M.D.   On: 10/27/2019 11:47   DG Lumbar Spine Complete  Result Date: 10/27/2019 CLINICAL DATA:  Hypoxia, twisted back with lower back pain EXAM: LUMBAR SPINE - COMPLETE 4+ VIEW COMPARISON:  Lumbar spine evaluations from 2014, no recent comparison evaluations. FINDINGS: Signs of cement augmentation at the T12 level. Not seen on previous lumbar spine radiographs but partially imaged on a prior intraoperative radiograph for stent placement was performed on May 30, 2018. This area was not well assessed on the previous radiograph and there is kyphotic deformity at this level with posterior translation/retrolisthesis of T12 on L1 approximately 1 cm. Five lumbar type vertebral bodies are demonstrated. There is also loss of height at the L1 and L2 levels compared to previous imaging, these are age indeterminate. Spinal degenerative changes are noted. Post L3 through L5 spinal fusion with interbody cage placement at the L3-4 level. Degenerative changes in the spine. Signs of L5 compression fracture which is similar to previous imaging. IMPRESSION: 1. Age indeterminate compression fractures of L1 and L2. 2. Signs of cement augmentation at T12 with kyphosis  and retrolisthesis of T12 on L1. No comparison for spinal alignment is available. Correlate with any point tenderness at the thoracolumbar junction and upper lumbar spine to determine whether any of these findings may be acute or associated with acute worsening of existing fractures. 3. Post L3 through L5 spinal fusion with interbody cage placement at the L3-4 level. Electronically Signed   By: Zetta Bills M.D.   On: 10/27/2019 11:53     Labs:   Basic Metabolic Panel: Recent Labs  Lab 10/27/19 1221 10/27/19 1221 10/27/19 1302 10/27/19 1302 10/29/19 0703 10/29/19 0703 10/30/19 1816 10/31/19 0420  NA 143  --  145  --  141  --  138 138  K 3.3*   < > 3.3*   < > 3.1*   < > 4.2 4.0  CL 105  --   --   --  100  --  99 97*  CO2 27  --   --   --  31  --  28 29  GLUCOSE 108*  --   --   --  97  --  105* 86  BUN 14  --   --   --  21  --  17 18  CREATININE 1.18  --   --   --  1.17  --  1.17 1.24  CALCIUM 9.2  --   --   --  8.8*  --  9.1 9.1  MG  --   --   --   --   --   --   --  1.8   < > = values in this interval not displayed.   GFR Estimated Creatinine Clearance: 43.7 mL/min (by C-G formula based on SCr of 1.24 mg/dL). Liver Function Tests: No results for input(s): AST, ALT, ALKPHOS, BILITOT, PROT, ALBUMIN in the last 168 hours. No results for input(s): LIPASE, AMYLASE in the last 168 hours. No results for input(s): AMMONIA in the last 168 hours. Coagulation profile No results for input(s): INR, PROTIME in the last 168 hours.  CBC: Recent Labs  Lab 10/27/19 1221 10/27/19 1302 10/29/19 0703 10/30/19 1816 10/31/19 0420  WBC 12.3*  --  10.6* 11.1* 10.5  NEUTROABS 9.4*  --   --   --   --   HGB 14.4 14.3 12.6* 15.4 15.9  HCT 44.6 42.0 36.5* 44.8 47.8  MCV 94.1  --  89.2 88.7 90.0  PLT 145*  --  153 203 207   Cardiac Enzymes: No results for input(s): CKTOTAL, CKMB, CKMBINDEX, TROPONINI in the last 168 hours. BNP: Invalid input(s): POCBNP CBG: No results for input(s): GLUCAP  in the last 168 hours. D-Dimer No results for input(s): DDIMER in the last 72 hours. Hgb A1c No results for input(s): HGBA1C in the last 72 hours. Lipid Profile No results for input(s): CHOL, HDL, LDLCALC, TRIG, CHOLHDL, LDLDIRECT in the last 72 hours. Thyroid function studies No results for input(s): TSH, T4TOTAL, T3FREE, THYROIDAB in the last 72 hours.  Invalid input(s): FREET3 Anemia work up No results for input(s): VITAMINB12, FOLATE, FERRITIN, TIBC, IRON, RETICCTPCT in the last 72 hours. Microbiology Recent Results (from the past 240 hour(s))  SARS Coronavirus 2 by RT PCR (hospital order, performed in [facility] hospital lab) Nasopharyngeal Nasopharyngeal Swab     Status: None   Collection Time: 10/27/19 12:27 PM   Specimen: Nasopharyngeal Swab  Result Value Ref Range Status   SARS Coronavirus 2 NEGATIVE NEGATIVE Final    Comment: (NOTE) SARS-CoV-2 target nucleic acids are NOT DETECTED.  The SARS-CoV-2 RNA is generally detectable in upper and lower respiratory specimens during the acute phase of infection. The lowest concentration of SARS-CoV-2 viral copies this assay can detect is 250 copies / mL. A negative result does not preclude SARS-CoV-2 infection and should not be used as the sole basis for treatment or other patient management decisions.  A negative result may occur with improper specimen collection / handling, submission of specimen other than nasopharyngeal swab, presence of viral mutation(s) within the areas targeted by this assay, and inadequate number of viral copies (<250 copies / mL). A negative result must be combined with clinical observations, patient history, and epidemiological information.  Fact Sheet for Patients:   StrictlyIdeas.no  Fact Sheet for Healthcare Providers: BankingDealers.co.za  This test is not yet approved or  cleared by the [place] FDA and has been authorized for detection and/or  diagnosis of SARS-CoV-2 by FDA under an Emergency Use Authorization (EUA).  This EUA will remain in effect (meaning this test can be used) for the duration of the COVID-19 declaration under Section 564(b)(1) of the Act, 21 U.S.C. section 360bbb-3(b)(1), unless the authorization is terminated or revoked sooner.  Performed at [facility], [place] [address] [place], [place] [address]      Discharge Instructions:  Discharge Instructions    Diet - low sodium heart healthy   Complete by: As directed    Increase activity slowly   Complete by: As directed      Allergies as of 10/31/2019      Reactions   Other Nausea And Vomiting, Other (See Comments)   pneumonia vaccine- chills, vomiting, fever, lost use of legs and body function. Had a fall post inj. (05/25/2013)   Pneumovax [pneumococcal Polysaccharide Vaccine] Nausea And Vomiting, Other (See Comments)   Chills, loss of bodily functions, ended up in the ED   Ibuprofen Other (See Comments)   Heart doctor advised he cannot take this   Norco [hydrocodone-acetaminophen]    Must have Zofran to tolerate this   Oxycodone Other (See Comments)   Mental status changes and causes sleepwalking      Medication List    STOP taking these medications   cefdinir 300 MG capsule Commonly known as: OMNICEF   HYDROcodone-acetaminophen 5-325 MG tablet Commonly known as: NORCO/VICODIN     TAKE these medications   acetaminophen 650 MG CR tablet Commonly known as: TYLENOL Take 1,300 mg by mouth every 8 (eight) hours as needed for pain.   albuterol 108 (90 Base) MCG/ACT inhaler Commonly known as: VENTOLIN HFA Inhale 2 puffs into the lungs every 6 (six) hours as needed for wheezing.   aspirin EC 81 MG tablet Take 81 mg by mouth at bedtime.   atorvastatin 80 MG tablet Commonly known as: LIPITOR Take 80 mg by mouth at bedtime. What changed: Another medication with the same name was removed. Continue taking this medication, and follow the  directions you see here.   benzonatate 200 MG capsule Commonly known as: TESSALON Take 200 mg by mouth in the morning and at bedtime.   calcitonin (salmon) 200 UNIT/ACT nasal spray Commonly known as: MIACALCIN/FORTICAL Place 1 spray into alternate nostrils at bedtime.   CALCIUM 600 PO Take 600 mg by mouth 2 (two) times daily with a meal.   clopidogrel 75 MG tablet Commonly known as: PLAVIX Take 75 mg by mouth daily.   clotrimazole 1 % cream Commonly known as: LOTRIMIN Apply 1 application topically See admin instructions. Apply to feet daily as directed   docusate sodium 100 MG capsule Commonly known as: COLACE Take 1 capsule (100 mg total) by mouth 2 (two) times daily.   donepezil 5 MG tablet Commonly known as: ARICEPT Take 5 mg by mouth at bedtime.   furosemide 20 MG tablet Commonly known as: LASIX Take 20 mg by mouth daily as needed for edema.   guaiFENesin 600 MG 12 hr tablet Commonly known as: MUCINEX Take 1,200 mg by mouth 2 (two) times daily.   ipratropium-albuterol 0.5-2.5 (3) MG/3ML Soln Commonly known as: DUONEB Take 3 mLs by nebulization 2 (two) times daily.   IRON PO Take 27 mg by mouth daily with supper.   ketoconazole 2 % cream Commonly known as: NIZORAL Apply 1 application topically daily as needed for irritation (on feet). Apply as needed to affected area.   lactulose 10 GM/15ML solution Commonly known as: CHRONULAC Take 10 g by mouth at bedtime as needed for mild constipation.   lidocaine 5 % Commonly known as: LIDODERM Place 1 patch onto the skin daily. Remove & Discard patch within 12 hours or as directed by MD   loratadine 10 MG tablet Commonly known as: CLARITIN Take 10 mg by mouth in the morning.   Lutein 40 MG Caps Take 40 mg by mouth  in the morning.   methocarbamol 500 MG tablet Commonly known as: ROBAXIN Take 1 tablet (500 mg total) by mouth 3 (three) times daily.   metoprolol succinate 50 MG 24 hr tablet Commonly known as:  TOPROL-XL TAKE ONE TABLET BY MOUTH ONCE DAILY WITH FOOD What changed: See the new instructions.   montelukast 10 MG tablet Commonly known as: SINGULAIR Take 10 mg by mouth daily.   MULTIVITAMIN PO Take 1 tablet by mouth daily.   nitroGLYCERIN 0.4 MG SL tablet Commonly known as: NITROSTAT DISSOLVE 1 TABLET UNDER THE TONGUE EVERY 5 MINUTES AS NEEDED FOR CHEST PAIN. DO NOT EXCEED A TOTAL OF 3 DOSES IN 15 MINUTES. What changed: See the new instructions.   ondansetron 4 MG tablet Commonly known as: ZOFRAN Take 1 tablet (4 mg total) by mouth every 8 (eight) hours as needed for nausea or vomiting.   pantoprazole 40 MG tablet Commonly known as: PROTONIX Take 40 mg by mouth daily before breakfast.   polyethylene glycol 17 g packet Commonly known as: MIRALAX / GLYCOLAX Take 17 g by mouth daily.   potassium chloride 10 MEQ tablet Commonly known as: KLOR-CON Take 1 tablet (10 mEq total) by mouth daily as needed (ONLY WHEN TAKING LASIX).   rOPINIRole 0.5 MG tablet Commonly known as: REQUIP Take 0.5 mg by mouth in the morning, at noon, and at bedtime.   Symbicort 160-4.5 MCG/ACT inhaler Generic drug: budesonide-formoterol Inhale 2 puffs into the lungs 2 (two) times daily.   tamsulosin 0.4 MG Caps capsule Commonly known as: FLOMAX Take 0.4 mg by mouth daily after supper.   traMADol 50 MG tablet Commonly known as: ULTRAM Take 1-2 tablets (50-100 mg total) by mouth every 6 (six) hours as needed for up to 3 days for moderate pain. What changed: how much to take   Vitamin B-12 2500 MCG Subl Place 2,500 mcg under the tongue daily.   Vitamin D3 50 MCG (2000 UT) Tabs Take 2,000 Units by mouth daily.       Follow-up Information    [name]., MD.   Specialty: [facility] Medicine Contact information: [place] [address] [phone]        Constance Haw, MD Follow up.   Specialty: Cardiology Contact information: [place]  [address] [phone]                Time coordinating discharge: 35 min  Signed:  Geradine Girt DO  [facility] 10/31/2019, 9:32 AM

## 2019-10-31 NOTE — Progress Notes (Signed)
Report given to HeatherLPN at [facility], All questions and concerns were answered.

## 2019-10-31 NOTE — Progress Notes (Signed)
Will order stat rapid COVID as the COVID swab ordered on 9/12 WAS NOT DONE BY NURSE. Chris Braun

## 2019-10-31 NOTE — Progress Notes (Signed)
Discharge summary packet/documents provided to [facility] staff. Pt alert in no apparent distress.D/C to [facility] as ordered.

## 2019-10-31 NOTE — Progress Notes (Signed)
Physical Therapy Treatment Patient Details Name: Chris Braun MRN: [id number] DOB: Jul 01, 1935 Today's Date: 10/31/2019    History of Present Illness Pt is an 84 y/o male admitted secondary to increased back pain. Pt was unable to walk. Found to have L1-2 compression fx. Recommending TLSO. PMH includes asthma, HTN, CAD, COPD, osteoporosis and CVA.     PT Comments    Pt supine in bed on arrival this session.  Pain is much better controlled this session but still present.  Pt performed movement to edge of bed where brace was donned in sitting.  Educated wife on use of brace and all it's components.  Pt continues to benefit from snf to maximize functional gains before returning home to his elderly wife.     Follow Up Recommendations  SNF;Supervision/Assistance - 24 hour     Equipment Recommendations  Wheelchair (measurements PT);Wheelchair cushion (measurements PT);Other (comment);Hospital bed    Recommendations for Other Services       Precautions / Restrictions Precautions Precautions: Fall Precaution Comments: Pt's wife reports pt has frequent falls.  Required Braces or Orthoses: Spinal Brace Spinal Brace: Thoracolumbosacral orthotic Restrictions Weight Bearing Restrictions: No    Mobility  Bed Mobility Overal bed mobility: Needs Assistance Bed Mobility: Rolling;Sidelying to Sit Rolling: Min assist Sidelying to sit: Mod assist       General bed mobility comments: Pt required min assistance to roll and mod assistance to advance LEs to edge of bed and elevate trunk into a seated position.  Transfers Overall transfer level: Needs assistance Equipment used: Rolling walker (2 wheeled) Transfers: Sit to/from Stand Sit to Stand: Mod assist;Min assist;From elevated surface         General transfer comment: Pt required min assistance to boost from elevated bed into standing.  Pt required increased time and effort and presents with forward flexed  posturing  Ambulation/Gait Ambulation/Gait assistance: Mod assist Gait Distance (Feet): 4 Feet Assistive device: Rolling walker (2 wheeled) Gait Pattern/deviations: Step-to pattern;Shuffle;Trunk flexed     General Gait Details: Pt peformed side steps to the L side to step toward teh [facility].  He tolerated this well but limited due to pain and fatigue.   Stairs             Wheelchair Mobility    Modified Rankin (Stroke Patients Only)       Balance Overall balance assessment: Needs assistance;History of Falls Sitting-balance support: Feet supported Sitting balance-Leahy Scale: Fair       Standing balance-Leahy Scale: Poor                              Cognition Arousal/Alertness: Lethargic Behavior During Therapy: Flat affect Overall Cognitive Status: Difficult to assess                                 General Comments: Pt somewhat sleepy but followed commands with increased time and did remarkably better this session.      Exercises      General Comments        Pertinent Vitals/Pain Pain Assessment: Faces Faces Pain Scale: Hurts even more Pain Location: L flank Pain Descriptors / Indicators: Aching;Guarding;Grimacing Pain Intervention(s): Monitored during session;Repositioned    Home Living                      Prior Function  PT Goals (current goals can now be found in the care plan section) Acute Rehab PT Goals Patient Stated Goal: to make sure pt can walk before going home Potential to Achieve Goals: Fair Progress towards PT goals: Progressing toward goals    Frequency    Min 2X/week      PT Plan Current plan remains appropriate    Co-evaluation              AM-PAC PT "6 Clicks" Mobility   Outcome Measure  Help needed turning from your back to your side while in a flat bed without using bedrails?: A Little Help needed moving from lying on your back to sitting on the side of a flat  bed without using bedrails?: A Lot Help needed moving to and from a bed to a chair (including a wheelchair)?: A Lot Help needed standing up from a chair using your arms (e.g., wheelchair or bedside chair)?: A Little Help needed to walk in hospital room?: A Lot Help needed climbing 3-5 steps with a railing? : Total 6 Click Score: 13    End of Session Equipment Utilized During Treatment: Gait belt;Back brace Activity Tolerance: Patient limited by pain Patient left: in bed;with call bell/phone within reach;with family/visitor present Nurse Communication: Mobility status PT Visit Diagnosis: Unsteadiness on feet (R26.81);Muscle weakness (generalized) (M62.81);History of falling (Z91.81);Repeated falls (R29.6);Difficulty in walking, not elsewhere classified (R26.2);Pain Pain - part of body:  (back)     Time: 5953-9672 PT Time Calculation (min) (ACUTE ONLY): 37 min  Charges:  $Therapeutic Activity: 23-37 mins                     Chris Braun , PTA Acute Rehabilitation Services Pager [phone] Office [phone]     Chris Braun Eli Braun 10/31/2019, 11:23 AM

## 2019-10-31 NOTE — NC FL2 (Addendum)
[place] LEVEL OF CARE SCREENING TOOL     IDENTIFICATION  Patient Name: Chris Braun Birthdate: Jun 09, 1935 Sex: male Admission Date (Current Location): 10/27/2019  [facility] and [place] Number:  Herbalist and Address:  [facility] [place], [place] [address] [place], [place] [address]      Provider Number: [id number]  Attending Physician Name and Address:  Geradine Girt, DO  Relative Name and Phone Number:       Current Level of Care: hospital  Recommended Level of Care: [place] Prior Approval Number:    Date Approved/Denied:   PASRR Number: [id number]  Discharge Plan: SNF    Current Diagnoses: Patient Active Problem List   Diagnosis Date Noted   Lumbar vertebral fracture, pathologic 10/27/2019   Compression fracture of L2 lumbar vertebra, closed, initial encounter ([place]) 10/27/2019   Hypoxia    Atrial tachycardia ([place]) 07/26/2018   Stroke ([place])    Osteoporosis    Macular degeneration    Idiopathic progressive polyneuropathy    Hypertension    High cholesterol    Hiatal hernia    GERD (gastroesophageal reflux disease)    Asthma    COPD (chronic obstructive pulmonary disease) ([place])    Diverticulosis    B12 deficiency    Acute hypoxemic respiratory failure ([place]) 05/27/2018   AKI (acute kidney injury) ([place]) 05/27/2018   Right kidney stone 05/27/2018   Right upper lobe pneumonia 05/27/2018   Status post ablation of ventricular arrhythmia 04/27/2018   Memory disorder 01/19/2018   Gait abnormality 01/19/2018   Oculomotor nerve palsy, 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   Falls frequently 06/03/2015   Weakness of distal arms and legs 06/03/2015   Risk for falls 05/28/2015   CAD (coronary artery disease) 08/24/2014   Carotid stenosis, bilateral 08/24/2014   Dyslipidemia 08/24/2014   Stroke (cerebrum) ([place]) 01/17/2014    Orientation RESPIRATION BLADDER Height & Weight     Self  Normal  External catheter Weight: 78 kg Height:  5\' 7"  (170.2 cm)  BEHAVIORAL SYMPTOMS/MOOD NEUROLOGICAL BOWEL NUTRITION STATUS      Continent Diet (refer to d/c summary)  AMBULATORY STATUS COMMUNICATION OF NEEDS Skin   Extensive Assist Verbally Normal                       Personal Care Assistance Level of Assistance  Bathing, Feeding, Dressing Bathing Assistance: Maximum assistance Feeding assistance: Limited assistance Dressing Assistance: Maximum assistance     Functional Limitations Info  Sight, Hearing, Speech Sight Info: Adequate Hearing Info: Adequate Speech Info: Adequate    SPECIAL CARE FACTORS FREQUENCY  PT (By licensed PT), OT (By licensed OT)     PT Frequency: 5x/week. evaluate and treat OT Frequency: 5x/week. evaluate and treat            Contractures Contractures Info: Not present    Additional Factors Info  Code Status, Allergies Code Status Info: Full Code Allergies Info: Pneumovax Pneumococcal Polysaccharide Vaccine, Ibuprofen, Norco Hydrocodone-acetaminophen, Oxycodone           Current Medications (10/31/2019):  This is the current hospital active medication list Current Facility-Administered Medications  Medication Dose Route Frequency Provider Last Rate Last Admin   acetaminophen (TYLENOL) tablet 1,000 mg  1,000 mg Oral TID Vann, Jessica U, DO   1,000 mg at 10/30/19 2253   albuterol (PROVENTIL) (2.5 MG/3ML) 0.083% nebulizer solution 2.5 mg  2.5 mg Inhalation Q6H PRN Lequita Halt, MD  aspirin EC tablet 81 mg  81 mg Oral Daily Wynetta Fines T, MD   81 mg at 10/30/19 0751   atorvastatin (LIPITOR) tablet 80 mg  80 mg Oral Daily Wynetta Fines T, MD   80 mg at 10/30/19 5732   benzonatate (TESSALON) capsule 200 mg  200 mg Oral QHS Wynetta Fines T, MD   200 mg at 10/30/19 2254   calcitonin (salmon) (MIACALCIN/FORTICAL) nasal spray 1 spray  1 spray Alternating Nares Daily Lequita Halt, MD   1 spray at 10/30/19 1724   cholecalciferol (VITAMIN D3)  tablet 1,000 Units  1,000 Units Oral Daily Lequita Halt, MD   1,000 Units at 10/30/19 0753   clopidogrel (PLAVIX) tablet 75 mg  75 mg Oral Daily Wynetta Fines T, MD   75 mg at 10/30/19 0754   docusate sodium (COLACE) capsule 100 mg  100 mg Oral BID Wynetta Fines T, MD   100 mg at 10/30/19 2254   donepezil (ARICEPT) tablet 5 mg  5 mg Oral QHS Wynetta Fines T, MD   5 mg at 10/30/19 2254   enoxaparin (LOVENOX) injection 40 mg  40 mg Subcutaneous Q24H Wynetta Fines T, MD   40 mg at 10/30/19 1724   fluticasone furoate-vilanterol (BREO ELLIPTA) 200-25 MCG/INH 1 puff  1 puff Inhalation Daily Wynetta Fines T, MD   1 puff at 10/31/19 0828   guaiFENesin (MUCINEX) 12 hr tablet 600 mg  600 mg Oral BID Wynetta Fines T, MD   600 mg at 10/30/19 2255   HYDROmorphone (DILAUDID) injection 0.5-1 mg  0.5-1 mg Intravenous Q2H PRN Eulogio Bear U, DO       ipratropium-albuterol (DUONEB) 0.5-2.5 (3) MG/3ML nebulizer solution 3 mL  3 mL Nebulization Q6H PRN Vann, Jessica U, DO       lactulose ([place]) 10 GM/15ML solution 10 g  10 g Oral Daily PRN Wynetta Fines T, MD       lidocaine (LIDODERM) 5 % 1 patch  1 patch Transdermal Q24H Eulogio Bear U, DO   1 patch at 10/30/19 0802   loratadine (CLARITIN) tablet 10 mg  10 mg Oral Daily Wynetta Fines T, MD   10 mg at 10/30/19 0802   methocarbamol (ROBAXIN) tablet 500 mg  500 mg Oral TID Eulogio Bear U, DO   500 mg at 10/30/19 2255   metoprolol succinate (TOPROL-XL) 24 hr tablet 50 mg  50 mg Oral Daily Vann, Jessica U, DO       montelukast (SINGULAIR) tablet 10 mg  10 mg Oral QHS Wynetta Fines T, MD   10 mg at 10/30/19 2256   multivitamin with minerals tablet 1 tablet  1 tablet Oral Daily Wynetta Fines T, MD   1 tablet at 10/30/19 0805   ondansetron (ZOFRAN) tablet 4 mg  4 mg Oral Q8H PRN Wynetta Fines T, MD       pantoprazole (PROTONIX) EC tablet 40 mg  40 mg Oral Daily Wynetta Fines T, MD   40 mg at 10/30/19 0806   polyethylene glycol (MIRALAX / GLYCOLAX) packet 17 g  17 g Oral  Daily Wynetta Fines T, MD   17 g at 10/30/19 0806   potassium chloride (KLOR-CON) CR tablet 10 mEq  10 mEq Oral Daily PRN Eulogio Bear U, DO       rOPINIRole (REQUIP) tablet 0.5 mg  0.5 mg Oral TID Wynetta Fines T, MD   0.5 mg at 10/30/19 2256   tamsulosin (FLOMAX) capsule 0.4 mg  0.4 mg Oral  QPC supper Wynetta Fines T, MD   0.4 mg at 10/30/19 1706   traMADol (ULTRAM) tablet 50 mg  50 mg Oral Q6H PRN Eulogio Bear U, DO   50 mg at 10/30/19 1446   traZODone (DESYREL) tablet 25 mg  25 mg Oral QHS Vann, Jessica U, DO   25 mg at 10/30/19 2255   vitamin B-12 (CYANOCOBALAMIN) tablet 2,500 mcg  2,500 mcg Oral Daily Lequita Halt, MD   2,500 mcg at 10/30/19 7290     Discharge Medications: Please see discharge summary for a list of discharge medications.  Relevant Imaging Results:  Relevant Lab Results:   Additional Information SS# [id number]  Sharin Mons, RN

## 2019-10-31 NOTE — Progress Notes (Signed)
This Probation officer has been informed that pt had 6run of VTach, Pt seen in room in no acute distress. Denies any discomfort at this time. Aymptomatic. No complaints voiced. Attending MD made aware. Pt closely monitored.

## 2019-10-31 NOTE — Care Management Important Message (Signed)
Important Message  Patient Details  Name: Chris Braun MRN: [id number] Date of Birth: 1935-03-28   Medicare Important Message Given:  Yes - Important Message mailed due to current National Emergency  Verbal consent obtained due to current National Emergency  Relationship to patient: Self Contact Name: Jaquel Glassburn Call Date: 10/31/19  Time: 1123 Phone: [phone] Outcome: No Answer/Busy Important Message mailed to: Patient address on file    Delorse Lek 10/31/2019, 11:23 AM

## 2019-10-31 NOTE — TOC Initial Note (Addendum)
Transition of Care [facility]) - Initial/Assessment Note    Patient Details  Name: Chris Braun MRN: [id number] Date of Birth: 30-Nov-1935  Transition of [facility]) CM/SW Contact:    Sharin Mons, RN Phone Number: 10/31/2019, 9:34 AM  Clinical Narrative:   Presents with acute back pain and worsening ambulation fx. Hx of  severe osteoporosis with multiple thoracic spine compression fractures T7, T8, T9, T12 status post multiple back surgeries and the most recent one was in 2020, CAD with stents, asthma/COPD, chronic ambulation dysfunction, stroke, Alzheimer's dementia. From home with wife Arville Go. [place].         Levie Wages (Spouse)     [phone]     RNCM received consult for possible SNF placement at time of discharge. RNCM spoke with wife @ bedside regarding PT recommendation of SNF placement at time of discharge. Wife reported that patient is currently unable to care for patient at their home given patient's current physical needs and fall risk. Wife expressed understanding of PT recommendation and is agreeable to SNF placement at time of discharge. Wife reports preference for  [facility] .RNCM discussed insurance authorization process and provided Medicare SNF ratings list. Wife expressed being hopeful for rehab and to feel better soon. No further questions reported at this time. RNCM to continue to follow and assist with discharge planning needs. Pt is fully COVID vaccinated.  10/31/2019 @ [place] accepted for SNF placement , wife accepted bed offer. Insurance auth pending, updated COVID needed, MD and nurse aware.  10/31/2019 Insurance auth.received, # [id number], transportation [place]  Expected Discharge Plan: Skilled Nursing Facility Barriers to Discharge: Insurance Authorization   Patient Goals and CMS Choice        Expected Discharge Plan and Services Expected Discharge Plan: [place]         Expected Discharge Date: 10/31/19                                     Prior Living Arrangements/Services                       Activities of Daily Living Home Assistive Devices/Equipment: None ADL Screening (condition at time of admission) Patient's cognitive ability adequate to safely complete daily activities?: Yes Is the patient deaf or have difficulty hearing?: Yes Does the patient have difficulty seeing, even when wearing glasses/contacts?: No Does the patient have difficulty concentrating, remembering, or making decisions?: Yes Patient able to express need for assistance with ADLs?: Yes Does the patient have difficulty dressing or bathing?: No Independently performs ADLs?: Yes (appropriate for developmental age) Does the patient have difficulty walking or climbing stairs?: Yes Weakness of Legs: Both Weakness of Arms/Hands: Both  Permission Sought/Granted                  Emotional Assessment              Admission diagnosis:  Hypoxia [R09.02] Lumbar compression fracture, closed, initial encounter ([place]) [S32.000A] Compression fracture of L2 lumbar vertebra, closed, initial encounter ([place]) [S32.020A] Patient Active Problem List   Diagnosis Date Noted  . Lumbar vertebral fracture, pathologic 10/27/2019  . Compression fracture of L2 lumbar vertebra, closed, initial encounter ([place]) 10/27/2019  . Hypoxia   . Atrial tachycardia ([place]) 07/26/2018  . Stroke ([place])   . Osteoporosis   . Macular degeneration   . Idiopathic progressive polyneuropathy   .  Hypertension   . High cholesterol   . Hiatal hernia   . GERD (gastroesophageal reflux disease)   . Asthma   . COPD (chronic obstructive pulmonary disease) ([place])   . Diverticulosis   . B12 deficiency   . Acute hypoxemic respiratory failure ([place]) 05/27/2018  . AKI (acute kidney injury) ([place]) 05/27/2018  . Right kidney stone 05/27/2018  . Right upper lobe pneumonia 05/27/2018  . Status post ablation of ventricular arrhythmia 04/27/2018  .  Memory disorder 01/19/2018  . Gait abnormality 01/19/2018  . 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 Falls frequently 06/03/2015  . Weakness of distal arms and legs 06/03/2015  . Risk for falls 05/28/2015  . CAD (coronary artery disease) 08/24/2014  . Carotid stenosis, bilateral 08/24/2014  . Dyslipidemia 08/24/2014  . Stroke (cerebrum) ([place]) 01/17/2014   PCP:  [name]., MD (Inactive) Pharmacy:   [facility], [address] [place] [address] Phone: [phone] Fax: [phone]     Social Determinants of Health (SDOH) Interventions    Readmission Risk Interventions No flowsheet data found.

## 2019-11-01 DIAGNOSIS — R262 Difficulty in walking, not elsewhere classified: Secondary | ICD-10-CM | POA: Diagnosis not present

## 2019-11-01 DIAGNOSIS — F0391 Unspecified dementia with behavioral disturbance: Secondary | ICD-10-CM | POA: Diagnosis not present

## 2019-11-01 DIAGNOSIS — M549 Dorsalgia, unspecified: Secondary | ICD-10-CM | POA: Diagnosis not present

## 2019-11-01 DIAGNOSIS — I2510 Atherosclerotic heart disease of native coronary artery without angina pectoris: Secondary | ICD-10-CM | POA: Diagnosis not present

## 2019-11-02 DIAGNOSIS — F32A Depression, unspecified: Secondary | ICD-10-CM | POA: Insufficient documentation

## 2019-11-02 DIAGNOSIS — N200 Calculus of kidney: Secondary | ICD-10-CM | POA: Insufficient documentation

## 2019-11-02 DIAGNOSIS — H269 Unspecified cataract: Secondary | ICD-10-CM | POA: Insufficient documentation

## 2019-11-03 ENCOUNTER — Other Ambulatory Visit: Payer: Self-pay

## 2019-11-03 ENCOUNTER — Ambulatory Visit: Payer: PPO | Admitting: Cardiology

## 2019-11-03 ENCOUNTER — Encounter: Payer: Self-pay | Admitting: Cardiology

## 2019-11-03 VITALS — BP 114/75 | HR 76 | Ht 67.0 in

## 2019-11-03 DIAGNOSIS — Z9889 Other specified postprocedural states: Secondary | ICD-10-CM | POA: Diagnosis not present

## 2019-11-03 DIAGNOSIS — E785 Hyperlipidemia, unspecified: Secondary | ICD-10-CM | POA: Diagnosis not present

## 2019-11-03 DIAGNOSIS — I2510 Atherosclerotic heart disease of native coronary artery without angina pectoris: Secondary | ICD-10-CM | POA: Diagnosis not present

## 2019-11-03 DIAGNOSIS — Z8679 Personal history of other diseases of the circulatory system: Secondary | ICD-10-CM | POA: Diagnosis not present

## 2019-11-03 NOTE — Patient Instructions (Signed)
Medication Instructions:  No medication changes. *If you need a refill on your cardiac medications before your next appointment, please call your pharmacy*   Lab Work: None ordered If you have labs (blood work) drawn today and your tests are completely normal, you will receive your results only by: Marland Kitchen MyChart Message (if you have MyChart) OR . A paper copy in the mail If you have any lab test that is abnormal or we need to change your treatment, we will call you to review the results.   Testing/Procedures: None ordered   Follow-Up: At [facility], you and your health needs are our priority.  As part of our continuing mission to provide you with exceptional heart care, we have created designated Provider Care Teams.  These Care Teams include your primary Cardiologist (physician) and Advanced Practice Providers (APPs -  Physician Assistants and Nurse Practitioners) who all work together to provide you with the care you need, when you need it.  We recommend signing up for the patient portal called "MyChart".  Sign up information is provided on this After Visit Summary.  MyChart is used to connect with patients for Virtual Visits (Telemedicine).  Patients are able to view lab/test results, encounter notes, upcoming appointments, etc.  Non-urgent messages can be sent to your provider as well.   To learn more about what you can do with MyChart, go to [email or web address].    Your next appointment:   6 month(s)  The format for your next appointment:   In Person  Provider:   Jyl Heinz, MD   Other Instructions  Blood Pressure Record Sheet To take your blood pressure, you will need a blood pressure machine. You can buy a blood pressure machine (blood pressure monitor) at your clinic, drug store, or online. When choosing one, consider:  An automatic monitor that has an arm cuff.  A cuff that wraps snugly around your upper arm. You should be able to fit only one finger between  your arm and the cuff.  A device that stores blood pressure reading results.  Do not choose a monitor that measures your blood pressure from your wrist or finger. Follow your health care provider's instructions for how to take your blood pressure. To use this form:  Get one reading in the morning (a.m.) 1 hour after medication.  Get one reading in the evening (p.m.) before supper.  Take at least 2 readings with each blood pressure check. This makes sure the results are correct. Wait 1-2 minutes between measurements.  Write down the results in the spaces on this form.  Repeat this once a week, or as told by your health care provider.  Make a follow-up appointment with your health care provider to discuss the results. Blood pressure log Date: _______________________  a.m. _____________________(1st reading) _____________________(2nd reading)  p.m. _____________________(1st reading) _____________________(2nd reading) Date: _______________________  a.m. _____________________(1st reading) _____________________(2nd reading)  p.m. _____________________(1st reading) _____________________(2nd reading) Date: _______________________  a.m. _____________________(1st reading) _____________________(2nd reading)  p.m. _____________________(1st reading) _____________________(2nd reading) Date: _______________________  a.m. _____________________(1st reading) _____________________(2nd reading)  p.m. _____________________(1st reading) _____________________(2nd reading) Date: _______________________  a.m. _____________________(1st reading) _____________________(2nd reading)  p.m. _____________________(1st reading) _____________________(2nd reading) This information is not intended to replace advice given to you by your health care provider. Make sure you discuss any questions you have with your health care provider. Document Revised: 04/03/2017 Document Reviewed: 02/03/2017 Elsevier Patient  Education  2020 [place] a log of BP/HR 3 times daily and send log to  office for review.  Always check his BP 1 hour after medications.

## 2019-11-03 NOTE — Progress Notes (Signed)
Cardiology Office Note:    Date:  11/03/2019   ID:  DAREK EIFLER, DOB 23-Apr-1935, MRN [id number]  PCP:  Penelope Coop, FNP  Cardiologist:  Jenean Lindau, MD   Referring MD: No ref. provider found    ASSESSMENT:    1. Coronary artery disease involving native coronary artery of native heart without angina pectoris   2. Status post ablation of ventricular arrhythmia   3. Dyslipidemia    PLAN:    In order of problems listed above:  1. Coronary artery disease: Secondary prevention stressed with the patient.  Importance of compliance with diet medication stressed and vocalized understanding. 2. Essential hypertension: Blood pressure is borderline.  I will do the facility to get her blood pressure log 3 times a day.  They will send it to me and I will review it.  We will titrate medications as necessary. 3. Mixed dyslipidemia: Managed by primary care physician.  Diet was emphasized 4. Patient is on Plavix therapy.  I told the patient that he and his wife need to discuss this with primary care physician.  He has had a history of stroke.  If this is only for cardiovascular purposes it can be discontinued as it is more than a year since had any cardiac intervention.  He can stay on low-dose aspirin.  If this is for stroke purposes then I would leave it to the discretion of his primary care provider and his neurologist. 5. Patient will be seen in follow-up appointment in 6 months or earlier if the patient has any concerns    Medication Adjustments/Labs and Tests Ordered: Current medicines are reviewed at length with the patient today.  Concerns regarding medicines are outlined above.  No orders of the defined types were placed in this encounter.  No orders of the defined types were placed in this encounter.    No chief complaint on file.    History of Present Illness:    Chris Braun is a 84 y.o. male.  Patient has past medical history of coronary artery disease essential  hypertension and dyslipidemia.  He recently went to the hospital with fall and had vertebral fractures and is using a brace around his chest.  He is in a facility at this time.  No chest pain orthopnea or PND.  He is comfortable.  His wife mentions to me that he has some memory issues which are longstanding.  At the time of my evaluation, the patient is alert awake oriented and in no distress.  Past Medical History:  Diagnosis Date  . Acute hypoxemic respiratory failure ([place]) 05/27/2018  . AKI (acute kidney injury) ([place]) 05/27/2018  . Asthma   . Atrial tachycardia ([place]) 07/26/2018  . B12 deficiency   . CAD (coronary artery disease) 08/24/2014  . Carotid stenosis, bilateral 08/24/2014  . Cataracts, bilateral   . Compression fracture    BAck  . Compression fracture of L2 lumbar vertebra, closed, initial encounter ([place]) 10/27/2019  . COPD (chronic obstructive pulmonary disease) ([place])   . Depression   . Diverticulosis    per colonscopy  . Dyslipidemia 08/24/2014  . Falls frequently 06/03/2015  . Gait abnormality 01/19/2018  . GERD (gastroesophageal reflux disease)   . Hiatal hernia   . High cholesterol   . Hypertension   . Hypoxia   . Idiopathic progressive polyneuropathy   . Kidney stones   . Lumbar vertebral fracture, pathologic 10/27/2019  . Macular degeneration   . Memory disorder 01/19/2018  .  Myocardial infarction ([place]) 2002  . 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 Osteoporosis   . Right kidney stone 05/27/2018  . Right upper lobe pneumonia 05/27/2018  . Risk for falls 05/28/2015  . Status post ablation of ventricular arrhythmia 04/27/2018  . Stroke (cerebrum) ([place]) 01/17/2014  . Stroke ([place])   . Weakness of distal arms and legs 06/03/2015    Past Surgical History:  Procedure Laterality Date  . CARDIAC CATHETERIZATION  2002,   stents   . CATARACT EXTRACTION Left 2016  . Colonscopy  2012  . CYSTOSCOPY W/ URETERAL STENT PLACEMENT Right 05/30/2018   Procedure: CYSTOSCOPY WITH  RETROGRADE PYELOGRAM/URETERAL STENT PLACEMENT;  Surgeon: Irine Seal, MD;  Location: [facility];  Service: Urology;  Laterality: Right;  . HIP FRACTURE SURGERY  2008   right  . HYDROCELE EXCISION  10/12/2014  . LITHOTRIPSY     2007, 2009, 2010, 2011, 2012, 2013  . LUMBAR LAMINECTOMY/DECOMPRESSION MICRODISCECTOMY  03/15/2012   Procedure: LUMBAR LAMINECTOMY/DECOMPRESSION MICRODISCECTOMY 1 LEVEL;  Surgeon: Elaina Hoops, MD;  Location: [facility];  Service: Neurosurgery;  Laterality: Left;  Left lumbar three-four decompressive lumbar laminectomy, discectomy  . MAXIMUM ACCESS (MAS)POSTERIOR LUMBAR INTERBODY FUSION (PLIF) 2 LEVEL N/A 12/08/2012   Procedure: FOR MAXIMUM ACCESS (MAS) POSTERIOR LUMBAR INTERBODY FUSION (PLIF) 2 LEVEL;  Surgeon: Elaina Hoops, MD;  Location: [facility];  Service: Neurosurgery;  Laterality: N/A;  FOR MAXIMUM ACCESS (MAS) POSTERIOR LUMBAR INTERBODY FUSION (PLIF) 2 LEVEL  . ROTATOR CUFF REPAIR  1999   bil   . [place]  . TOTAL HIP ARTHROPLASTY Right 2008  . VT study     with Ablation    Current Medications: Current Meds  Medication Sig  . Acetaminophen 325 MG CAPS Take 975 mg by mouth every 8 (eight) hours as needed.  Marland Kitchen albuterol (PROVENTIL HFA;VENTOLIN HFA) 108 (90 BASE) MCG/ACT inhaler Inhale 2 puffs into the lungs every 6 (six) hours as needed for wheezing.  Marland Kitchen aspirin EC 81 MG tablet Take 81 mg by mouth at bedtime.   Marland Kitchen atorvastatin (LIPITOR) 80 MG tablet Take 80 mg by mouth at bedtime.   . calcitonin, salmon, (MIACALCIN/FORTICAL) 200 UNIT/ACT nasal spray Place 1 spray into alternate nostrils at bedtime.   . calcium-vitamin D (OSCAL WITH D) 500-200 MG-UNIT tablet Take 1 tablet by mouth 2 (two) times daily.  . Cholecalciferol (VITAMIN D3) 1.25 MG (50000 UT) TABS Take 5,000 Units by mouth daily.  . clopidogrel (PLAVIX) 75 MG tablet Take 75 mg by mouth daily.  . clotrimazole (LOTRIMIN) 1 % cream Apply 1 application topically See admin  instructions. Apply to feet daily as directed  . donepezil (ARICEPT) 5 MG tablet Take 5 mg by mouth at bedtime.   . ferrous sulfate 325 (65 FE) MG tablet Take 325 mg by mouth daily with breakfast.  . guaiFENesin (MUCINEX) 600 MG 12 hr tablet Take 1,200 mg by mouth 2 (two) times daily.   Marland Kitchen ipratropium-albuterol (DUONEB) 0.5-2.5 (3) MG/3ML SOLN Take 3 mLs by nebulization 2 (two) times daily.   Marland Kitchen ketoconazole (NIZORAL) 2 % cream Apply 1 application topically daily as needed for irritation (on feet). Apply as needed to affected area.    . lactulose (CHRONULAC) 10 GM/15ML solution Take 10 g by mouth at bedtime as needed for mild constipation.   . Lidocaine (ASPERCREME LIDOCAINE) 4 % PTCH Apply 1 patch topically.  Marland Kitchen loratadine (CLARITIN) 10 MG tablet Take 10 mg by mouth  in the morning.   . methocarbamol (ROBAXIN) 500 MG tablet Take 1 tablet (500 mg total) by mouth 3 (three) times daily.  . metoprolol succinate (TOPROL-XL) 50 MG 24 hr tablet TAKE ONE TABLET BY MOUTH ONCE DAILY WITH FOOD (Patient taking differently: Take 50 mg by mouth at bedtime. )  . montelukast (SINGULAIR) 10 MG tablet Take 10 mg by mouth daily.   . Multiple Vitamins-Minerals (MULTIVITAMIN PO) Take 1 tablet by mouth daily.  . Multiple Vitamins-Minerals (PRESERVISION AREDS 2 PO) Take by mouth.  . nitroGLYCERIN (NITROSTAT) 0.4 MG SL tablet DISSOLVE 1 TABLET UNDER THE TONGUE EVERY 5 MINUTES AS NEEDED FOR CHEST PAIN. DO NOT EXCEED A TOTAL OF 3 DOSES IN 15 MINUTES. (Patient taking differently: Place 0.4 mg under the tongue every 5 (five) minutes x 3 doses as needed for chest pain. )  . ondansetron (ZOFRAN) 4 MG tablet Take 1 tablet (4 mg total) by mouth every 8 (eight) hours as needed for nausea or vomiting. (Patient taking differently: Take 4 mg by mouth every 8 (eight) hours as needed for nausea or vomiting (or to help tolerate pain meds). )  . pantoprazole (PROTONIX) 40 MG tablet Take 40 mg by mouth daily before breakfast.   . polyethylene  glycol (MIRALAX / GLYCOLAX) 17 g packet Take 17 g by mouth daily.  Marland Kitchen rOPINIRole (REQUIP) 0.5 MG tablet Take 0.5 mg by mouth in the morning, at noon, and at bedtime.   . senna (SENOKOT) 8.6 MG TABS tablet Take 1 tablet by mouth at bedtime.  . SYMBICORT 160-4.5 MCG/ACT inhaler Inhale 2 puffs into the lungs 2 (two) times daily.  . tamsulosin (FLOMAX) 0.4 MG CAPS capsule Take 0.4 mg by mouth daily after supper.  . TRAMADOL HCL PO Take 1 tablet by mouth every 6 (six) hours as needed.  . vitamin B-12 (CYANOCOBALAMIN) 1000 MCG tablet Take 2,000 mcg by mouth daily.     Allergies:   Other, Pneumovax [pneumococcal polysaccharide vaccine], Ibuprofen, Norco [hydrocodone-acetaminophen], and Oxycodone   Social History   Socioeconomic History  . Marital status: Married    Spouse name: Syon Tews  . Number of children: 4  . Years of education: 52  . Highest education level: Not on file  Occupational History  . Occupation: Retired  Tobacco Use  . Smoking status: Former Smoker    Packs/day: 1.00    Years: 45.00    Pack years: 45.00    Types: Cigarettes    Quit date: 02/02/2001    Years since quitting: 18.7  . Smokeless tobacco: Current User    Types: Chew  Vaping Use  . Vaping Use: Never used  Substance and Sexual Activity  . Alcohol use: No  . Drug use: No  . Sexual activity: Not on file  Other Topics Concern  . Not on file  Social History Narrative   Lives with wife   Caffeine use: coffee, tea   Social Determinants of Health   Financial Resource Strain:   . Difficulty of Paying Living Expenses: Not on file  Food Insecurity:   . Worried About Charity fundraiser in the Last Year: Not on file  . Ran Out of Food in the Last Year: Not on file  Transportation Needs:   . Lack of Transportation (Medical): Not on file  . Lack of Transportation (Non-Medical): Not on file  Physical Activity:   . Days of Exercise per Week: Not on file  . Minutes of Exercise per Session: Not on file  Stress:   . Feeling of Stress : Not on file  Social Connections:   . Frequency of Communication with Friends and Family: Not on file  . Frequency of Social Gatherings with Friends and Family: Not on file  . Attends Religious Services: Not on file  . Active Member of Clubs or Organizations: Not on file  . Attends Archivist Meetings: Not on file  . Marital Status: Not on file     Family History: The patient's family history includes Alzheimer's disease in his sister; Heart disease in his father, mother, sister, sister, and sister; Hemolytic uremic syndrome in his mother; Stroke in his father.  ROS:   Please see the history of present illness.    All other systems reviewed and are negative.  EKGs/Labs/Other Studies Reviewed:    The following studies were reviewed today: I reviewed [facility] hospital records.   Recent Labs: 10/31/2019: BUN 18; Creatinine, Ser 1.24; Hemoglobin 15.9; Magnesium 1.8; Platelets 207; Potassium 4.0; Sodium 138  Recent Lipid Panel No results found for: CHOL, TRIG, HDL, CHOLHDL, VLDL, LDLCALC, LDLDIRECT  Physical Exam:    VS:  BP 114/75   Pulse 76   Ht 5\' 7"  (1.702 m)   SpO2 96%   BMI 26.94 kg/m     Wt Readings from Last 3 Encounters:  10/27/19 172 lb (78 kg)  08/03/19 178 lb 9.6 oz (81 kg)  01/28/19 172 lb 12.8 oz (78.4 kg)     GEN: Patient is in no acute distress HEENT: Normal NECK: No JVD; No carotid bruits LYMPHATICS: No lymphadenopathy CARDIAC: Hear sounds regular, 2/6 systolic murmur at the apex. RESPIRATORY:  Clear to auscultation without rales, wheezing or rhonchi  ABDOMEN: Soft, non-tender, non-distended MUSCULOSKELETAL:  No edema; No deformity  SKIN: Warm and dry NEUROLOGIC:  Alert and oriented x 3 PSYCHIATRIC:  Normal affect   Signed, Jenean Lindau, MD  11/03/2019 10:54 AM    [place]

## 2019-11-10 ENCOUNTER — Telehealth: Payer: Self-pay

## 2019-11-10 NOTE — Telephone Encounter (Signed)
[facility] called. They will be faxing some HR records for the patient to the office

## 2019-11-10 NOTE — Telephone Encounter (Signed)
Called and spoke with patients wife and told her that everything the facility sent looked ok and that the physician at [facility] to care for pt and they will call in consult. Wife and [facility] staff verbalized understanding and had no additional questions.

## 2019-11-10 NOTE — Telephone Encounter (Signed)
Patients wife reports patient is at [facility]. She received a call last night that he had elevated heart of 142. She is concerned that we Chris Braun is not sending information to Dr. Geraldo Pitter. Would like to confirm that communication is occurring and asks whether Patient needs to be seen by Dr. Geraldo Pitter.

## 2019-11-23 ENCOUNTER — Telehealth: Payer: Self-pay | Admitting: Cardiology

## 2019-11-23 DIAGNOSIS — J449 Chronic obstructive pulmonary disease, unspecified: Secondary | ICD-10-CM | POA: Diagnosis not present

## 2019-11-23 DIAGNOSIS — Z6825 Body mass index (BMI) 25.0-25.9, adult: Secondary | ICD-10-CM | POA: Diagnosis not present

## 2019-11-23 DIAGNOSIS — N400 Enlarged prostate without lower urinary tract symptoms: Secondary | ICD-10-CM | POA: Diagnosis not present

## 2019-11-23 DIAGNOSIS — E538 Deficiency of other specified B group vitamins: Secondary | ICD-10-CM | POA: Diagnosis not present

## 2019-11-23 DIAGNOSIS — I2510 Atherosclerotic heart disease of native coronary artery without angina pectoris: Secondary | ICD-10-CM | POA: Diagnosis not present

## 2019-11-23 DIAGNOSIS — I10 Essential (primary) hypertension: Secondary | ICD-10-CM | POA: Diagnosis not present

## 2019-11-23 DIAGNOSIS — G2581 Restless legs syndrome: Secondary | ICD-10-CM | POA: Diagnosis not present

## 2019-11-23 DIAGNOSIS — R300 Dysuria: Secondary | ICD-10-CM | POA: Diagnosis not present

## 2019-11-23 DIAGNOSIS — Z79899 Other long term (current) drug therapy: Secondary | ICD-10-CM | POA: Diagnosis not present

## 2019-11-23 DIAGNOSIS — J301 Allergic rhinitis due to pollen: Secondary | ICD-10-CM | POA: Diagnosis not present

## 2019-11-23 DIAGNOSIS — R5381 Other malaise: Secondary | ICD-10-CM | POA: Diagnosis not present

## 2019-11-23 NOTE — Telephone Encounter (Signed)
Tried to call Abby back again. No answer or voicemail. Will return call.

## 2019-11-23 NOTE — Telephone Encounter (Signed)
Tried to call Chris Braun back. No answer no voicemail.

## 2019-11-23 NOTE — Telephone Encounter (Signed)
Pt's wife is concerned that the patients heart rate is high since leaving the nursing home. She is concerned that they changed his medications while there and she would like him checked.

## 2019-11-23 NOTE — Telephone Encounter (Signed)
Abby returning call.

## 2019-11-23 NOTE — Telephone Encounter (Signed)
Abby from [facility] in [place] states patient was seen today and they are confused on his medications. She states originally on metoprolol succinate 50 mg 1 tablet daily and now is taking metoprolol tartrate 25mg  every 8 hrs. She states the patient's wife is having a hard time keeping up the the dosage and his HR does not go down. She states the patient's NP, Collier Salina, would like to speak with Dr. Geraldo Pitter.

## 2019-11-24 ENCOUNTER — Other Ambulatory Visit: Payer: Self-pay

## 2019-11-25 ENCOUNTER — Ambulatory Visit: Payer: PPO | Admitting: Cardiology

## 2019-11-25 ENCOUNTER — Telehealth: Payer: Self-pay | Admitting: Cardiology

## 2019-11-25 ENCOUNTER — Other Ambulatory Visit: Payer: Self-pay

## 2019-11-25 ENCOUNTER — Encounter: Payer: Self-pay | Admitting: Cardiology

## 2019-11-25 VITALS — BP 100/70 | HR 120 | Ht 67.0 in

## 2019-11-25 DIAGNOSIS — Z9889 Other specified postprocedural states: Secondary | ICD-10-CM | POA: Diagnosis not present

## 2019-11-25 DIAGNOSIS — S32020A Wedge compression fracture of second lumbar vertebra, initial encounter for closed fracture: Secondary | ICD-10-CM | POA: Diagnosis not present

## 2019-11-25 DIAGNOSIS — M4807 Spinal stenosis, lumbosacral region: Secondary | ICD-10-CM | POA: Diagnosis not present

## 2019-11-25 DIAGNOSIS — Z8679 Personal history of other diseases of the circulatory system: Secondary | ICD-10-CM

## 2019-11-25 DIAGNOSIS — X58XXXA Exposure to other specified factors, initial encounter: Secondary | ICD-10-CM | POA: Diagnosis not present

## 2019-11-25 DIAGNOSIS — E785 Hyperlipidemia, unspecified: Secondary | ICD-10-CM

## 2019-11-25 DIAGNOSIS — M5126 Other intervertebral disc displacement, lumbar region: Secondary | ICD-10-CM | POA: Diagnosis not present

## 2019-11-25 DIAGNOSIS — I2510 Atherosclerotic heart disease of native coronary artery without angina pectoris: Secondary | ICD-10-CM

## 2019-11-25 DIAGNOSIS — M48061 Spinal stenosis, lumbar region without neurogenic claudication: Secondary | ICD-10-CM | POA: Diagnosis not present

## 2019-11-25 DIAGNOSIS — Z981 Arthrodesis status: Secondary | ICD-10-CM | POA: Diagnosis not present

## 2019-11-25 MED ORDER — IVABRADINE HCL 5 MG PO TABS: 5.0000 mg | ORAL_TABLET | Freq: Two times a day (BID) | ORAL | 3 refills | Status: DC

## 2019-11-25 NOTE — Telephone Encounter (Signed)
Message left for pt's wife that we will try to obtain samples otherwise we do not have any other options.

## 2019-11-25 NOTE — Patient Instructions (Signed)
Medication Instructions:  Your physician has recommended you make the following change in your medication:   Start Colanor  *If you need a refill on your cardiac medications before your next appointment, please call your pharmacy*   Lab Work: Your physician recommends that you have your TSH checked today.  If you have labs (blood work) drawn today and your tests are completely normal, you will receive your results only by: Marland Kitchen MyChart Message (if you have MyChart) OR . A paper copy in the mail If you have any lab test that is abnormal or we need to change your treatment, we will call you to review the results.   Testing/Procedures: None ordered   Follow-Up: At [facility], you and your health needs are our priority.  As part of our continuing mission to provide you with exceptional heart care, we have created designated Provider Care Teams.  These Care Teams include your primary Cardiologist (physician) and Advanced Practice Providers (APPs -  Physician Assistants and Nurse Practitioners) who all work together to provide you with the care you need, when you need it.  We recommend signing up for the patient portal called "MyChart".  Sign up information is provided on this After Visit Summary.  MyChart is used to connect with patients for Virtual Visits (Telemedicine).  Patients are able to view lab/test results, encounter notes, upcoming appointments, etc.  Non-urgent messages can be sent to your provider as well.   To learn more about what you can do with MyChart, go to [email or web address].    Your next appointment:   1 month(s)  The format for your next appointment:   In Person  Provider:   Jyl Heinz, MD   Other Instructions Ivabradine oral solution What is this medicine? IVABRADINE (eye VAB ra deen) is used for heart failure. This medicine may be used for other purposes; ask your health care provider or pharmacist if you have questions. COMMON BRAND NAME(S):  Corlanor What should I tell my health care provider before I take this medicine? They need to know if you have any of these conditions:  certain heart conditions like sick sinus syndrome, sinoatrial block, or third-degree atrioventricular block  heart failure that has recently worsened  liver disease  low blood pressure  low resting heart rate  pacemaker  an unusual or allergic reaction to ivabradine, other medicines, foods, dyes, or preservatives  pregnant or trying to get pregnant  breast-feeding How should I use this medicine? Take this medicine by mouth. Follow the directions on the prescription label. Use a specially marked oral syringe, spoon, or dropper to measure each dose. Ask your pharmacist if you do not have one. Household spoons are not accurate. Take this medicine with food. Avoid grapefruit juice. Take your medicine at regular intervals. Do not take it more often than directed. Do not stop taking except on your doctor's advice. Talk to your pediatrician regarding the use of this medicine in children. While this drug may be prescribed for children as young as 6 months for selected conditions, precautions do apply. Overdosage: If you think you have taken too much of this medicine contact a poison control center or emergency room at once. NOTE: This medicine is only for you. Do not share this medicine with others. What if I miss a dose? If you miss a dose, skip it. Take your next dose at the normal time. Do not take extra or 2 doses at the same time to make up for the missed dose. What  may interact with this medicine? Do not take this medicine with any of the following medications:  certain medicines for fungal infections like ketoconazole, itraconazole, or posaconazole  certain antibiotics like clarithromycin and telithromycin  certain antivirals for HIV or hepatitis  ceritinib  conivaptan  idelalisib  nefazodone  ribociclib This medicine may also interact  with the following medications:  certain medicines for blood pressure, heart disease, irregular heartbeat  certain medicines for seizures like phenobarbital and phenytoin  rifampicin  St. John's wort This list may not describe all possible interactions. Give your health care provider a list of all the medicines, herbs, non-prescription drugs, or dietary supplements you use. Also tell them if you smoke, drink alcohol, or use illegal drugs. Some items may interact with your medicine. What should I watch for while using this medicine? Visit your healthcare professional for regular checks on your progress. Tell your healthcare professional if your symptoms do not start to get better or if they get worse. You may experience changes in vision. Use caution if you are driving or using machinery when sudden changes in light intensity may occur, especially while driving at night. This effect may decrease after using this medicine for a long time. Do not become pregnant while taking this medicine. Women should inform their healthcare professional if they wish to become pregnant or think they might be pregnant. There is a potential for serious side effects and harm to an unborn child. Talk to your health care professional for more information. What side effects may I notice from receiving this medicine? Side effects that you should report to your doctor or health care professional as soon as possible:  allergic reactions like skin rash, itching or hives, swelling of the face, lips, or tongue  high blood pressure  signs and symptoms of a dangerous change in heartbeat or heart rhythm like chest pain; dizziness; fast, irregular heartbeat; palpitations; feeling faint or lightheaded; falls; breathing problems  unusually slow heartbeat Side effects that usually do not require medical attention (report to your doctor or health care professional if they continue or are bothersome):  changes in vision This  list may not describe all possible side effects. Call your doctor for medical advice about side effects. You may report side effects to FDA at 1-800-FDA-1088. Where should I keep my medicine? Keep out of the reach of children. Store at room temperature between 20 and 25 degrees C (68 and 77 degrees F). Keep ampules in the unopened foil pouch until ready to use. Protect from light. Throw away any unused medicine after the expiration date. NOTE: This sheet is a summary. It may not cover all possible information. If you have questions about this medicine, talk to your doctor, pharmacist, or health care provider.  2020 Elsevier/Gold Standard (2017-09-30 09:04:08)

## 2019-11-25 NOTE — Telephone Encounter (Signed)
New Message:     Wife called,she said they can not afford the medicine that Dr Geraldo Pitter just put the patient on.She said the pharmacist said he was going to fax him something.Please call her and let her know something please,

## 2019-11-25 NOTE — Progress Notes (Signed)
Cardiology Office Note:    Date:  11/25/2019   ID:  Chris Braun, DOB 1935/11/29, MRN [id number]  PCP:  Penelope Coop, FNP  Cardiologist:  Jenean Lindau, MD   Referring MD: Penelope Coop, FNP    ASSESSMENT:    1. Coronary artery disease involving native coronary artery of native heart without angina pectoris   2. Dyslipidemia   3. Status post ablation of ventricular arrhythmia    PLAN:    In order of problems listed above:  1. Coronary artery disease: Secondary prevention stressed with the patient.  Importance of compliance with diet medication stressed. 2. Sinus tachycardia: Unclear etiology.  Hemoglobin is fine.  Patient does not appear to be septic.  No fever or any such issues documented by the family.  No cough or any such issues.  I will initiate him on Corlanor for his sinus tachycardia.  I will also check his TSH.  I told the family to monitor him closely for any signs of infection such as fever or productive cough or any such issues and they agreed to do so. 3. Essential hypertension: Blood pressure is borderline.  He is on beta-blocker and there is not much room to increase it for fear of hypotension. 4. Mixed dyslipidemia: Continue current management. 5. Follow-up appointment in a month or earlier if there is any concerns.  He knows to go to the nearest emergency room for any significant issues.   Medication Adjustments/Labs and Tests Ordered: Current medicines are reviewed at length with the patient today.  Concerns regarding medicines are outlined above.  No orders of the defined types were placed in this encounter.  No orders of the defined types were placed in this encounter.    No chief complaint on file.    History of Present Illness:    Chris Braun is a 84 y.o. male.  Patient has past medical history of coronary artery disease, essential hypertension and dyslipidemia.  He had a fall and he is essentially in a brace.  His wife mentions to me that  he is tachycardic more than the time and is not clear why he is so.  He is not anemic his recent blood work was fine I reviewed it.  Also noted that her TSH has not been done recently and we will check this.  This is concerned about her significantly.  Son also accompanies the father for the visit today.  At the time of my evaluation, the patient is alert awake oriented and in no distress.  Past Medical History:  Diagnosis Date  . Acute hypoxemic respiratory failure ([place]) 05/27/2018  . AKI (acute kidney injury) ([place]) 05/27/2018  . Asthma   . Atrial tachycardia ([place]) 07/26/2018  . B12 deficiency   . CAD (coronary artery disease) 08/24/2014  . Carotid stenosis, bilateral 08/24/2014  . Cataracts, bilateral   . Compression fracture    BAck  . Compression fracture of L2 lumbar vertebra, closed, initial encounter ([place]) 10/27/2019  . COPD (chronic obstructive pulmonary disease) ([place])   . Depression   . Diverticulosis    per colonscopy  . Dyslipidemia 08/24/2014  . Falls frequently 06/03/2015  . Gait abnormality 01/19/2018  . GERD (gastroesophageal reflux disease)   . Hiatal hernia   . High cholesterol   . Hypertension   . Hypoxia   . Idiopathic progressive polyneuropathy   . Kidney stones   . Lumbar vertebral fracture, pathologic 10/27/2019  . Macular degeneration   . Memory disorder  01/19/2018  . Myocardial infarction ([place]) 2002  . 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 Osteoporosis   . Right kidney stone 05/27/2018  . Right upper lobe pneumonia 05/27/2018  . Risk for falls 05/28/2015  . Status post ablation of ventricular arrhythmia 04/27/2018  . 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 Stroke ([place])   . Weakness of distal arms and legs 06/03/2015    Past Surgical History:  Procedure Laterality Date  . CARDIAC CATHETERIZATION  2002,   stents   . CATARACT EXTRACTION Left 2016  . Colonscopy  2012  . CYSTOSCOPY W/ URETERAL STENT PLACEMENT Right 05/30/2018   Procedure: CYSTOSCOPY WITH RETROGRADE  PYELOGRAM/URETERAL STENT PLACEMENT;  Surgeon: Irine Seal, MD;  Location: [facility];  Service: Urology;  Laterality: Right;  . HIP FRACTURE SURGERY  2008   right  . HYDROCELE EXCISION  10/12/2014  . LITHOTRIPSY     2007, 2009, 2010, 2011, 2012, 2013  . LUMBAR LAMINECTOMY/DECOMPRESSION MICRODISCECTOMY  03/15/2012   Procedure: LUMBAR LAMINECTOMY/DECOMPRESSION MICRODISCECTOMY 1 LEVEL;  Surgeon: Elaina Hoops, MD;  Location: [facility];  Service: Neurosurgery;  Laterality: Left;  Left lumbar three-four decompressive lumbar laminectomy, discectomy  . MAXIMUM ACCESS (MAS)POSTERIOR LUMBAR INTERBODY FUSION (PLIF) 2 LEVEL N/A 12/08/2012   Procedure: FOR MAXIMUM ACCESS (MAS) POSTERIOR LUMBAR INTERBODY FUSION (PLIF) 2 LEVEL;  Surgeon: Elaina Hoops, MD;  Location: [facility];  Service: Neurosurgery;  Laterality: N/A;  FOR MAXIMUM ACCESS (MAS) POSTERIOR LUMBAR INTERBODY FUSION (PLIF) 2 LEVEL  . ROTATOR CUFF REPAIR  1999   bil   . [place]  . TOTAL HIP ARTHROPLASTY Right 2008  . VT study     with Ablation    Current Medications: Current Meds  Medication Sig  . Acetaminophen 325 MG CAPS Take 975 mg by mouth every 8 (eight) hours as needed.  Marland Kitchen albuterol (PROVENTIL HFA;VENTOLIN HFA) 108 (90 BASE) MCG/ACT inhaler Inhale 2 puffs into the lungs every 6 (six) hours as needed for wheezing.  Marland Kitchen aspirin EC 81 MG tablet Take 81 mg by mouth at bedtime.   Marland Kitchen atorvastatin (LIPITOR) 80 MG tablet Take 80 mg by mouth at bedtime.   . benzonatate (TESSALON) 200 MG capsule Take 200 mg by mouth at bedtime as needed.  . calcitonin, salmon, (MIACALCIN/FORTICAL) 200 UNIT/ACT nasal spray Place 1 spray into alternate nostrils at bedtime.   . calcium-vitamin D (OSCAL WITH D) 500-200 MG-UNIT tablet Take 1 tablet by mouth 2 (two) times daily.  . Cholecalciferol (VITAMIN D3) 1.25 MG (50000 UT) TABS Take 5,000 Units by mouth daily.  . clopidogrel (PLAVIX) 75 MG tablet Take 75 mg by mouth daily.  .  clotrimazole (LOTRIMIN) 1 % cream Apply 1 application topically See admin instructions. Apply to feet daily as directed  . donepezil (ARICEPT) 5 MG tablet Take 5 mg by mouth at bedtime.   . ferrous sulfate 325 (65 FE) MG tablet Take 325 mg by mouth daily with breakfast.  . guaiFENesin (MUCINEX) 600 MG 12 hr tablet Take 1,200 mg by mouth 2 (two) times daily.   Marland Kitchen ipratropium-albuterol (DUONEB) 0.5-2.5 (3) MG/3ML SOLN Take 3 mLs by nebulization 2 (two) times daily.   Marland Kitchen ketoconazole (NIZORAL) 2 % cream Apply 1 application topically daily as needed for irritation (on feet). Apply as needed to affected area.    . lactulose (CHRONULAC) 10 GM/15ML solution Take 10 g by mouth at bedtime as needed for mild constipation.   . Lidocaine (ASPERCREME LIDOCAINE)  4 % PTCH Apply 1 patch topically.  Marland Kitchen loratadine (CLARITIN) 10 MG tablet Take 10 mg by mouth in the morning.   . methocarbamol (ROBAXIN) 500 MG tablet Take 1 tablet (500 mg total) by mouth 3 (three) times daily.  . metoprolol tartrate (LOPRESSOR) 25 MG tablet Take 25 mg by mouth 3 (three) times daily.  . montelukast (SINGULAIR) 10 MG tablet Take 10 mg by mouth daily.   . Multiple Vitamins-Minerals (MULTIVITAMIN PO) Take 1 tablet by mouth daily.  . Multiple Vitamins-Minerals (PRESERVISION AREDS 2 PO) Take by mouth.  . nitroGLYCERIN (NITROSTAT) 0.4 MG SL tablet DISSOLVE 1 TABLET UNDER THE TONGUE EVERY 5 MINUTES AS NEEDED FOR CHEST PAIN. DO NOT EXCEED A TOTAL OF 3 DOSES IN 15 MINUTES. (Patient taking differently: Place 0.4 mg under the tongue every 5 (five) minutes x 3 doses as needed for chest pain. )  . ondansetron (ZOFRAN) 4 MG tablet Take 1 tablet (4 mg total) by mouth every 8 (eight) hours as needed for nausea or vomiting. (Patient taking differently: Take 4 mg by mouth every 8 (eight) hours as needed for nausea or vomiting (or to help tolerate pain meds). )  . pantoprazole (PROTONIX) 40 MG tablet Take 40 mg by mouth daily before breakfast.   .  polyethylene glycol (MIRALAX / GLYCOLAX) 17 g packet Take 17 g by mouth daily.  Marland Kitchen rOPINIRole (REQUIP) 0.5 MG tablet Take 0.5 mg by mouth in the morning, at noon, and at bedtime.   . senna (SENOKOT) 8.6 MG TABS tablet Take 1 tablet by mouth at bedtime.  . SYMBICORT 160-4.5 MCG/ACT inhaler Inhale 2 puffs into the lungs 2 (two) times daily.  . tamsulosin (FLOMAX) 0.4 MG CAPS capsule Take 0.4 mg by mouth daily after supper.  . traMADol (ULTRAM) 50 MG tablet Take 50 mg by mouth every 6 (six) hours as needed.  . vitamin B-12 (CYANOCOBALAMIN) 1000 MCG tablet Take 2,000 mcg by mouth daily.     Allergies:   Other, Pneumovax [pneumococcal polysaccharide vaccine], Ibuprofen, Norco [hydrocodone-acetaminophen], and Oxycodone   Social History   Socioeconomic History  . Marital status: Married    Spouse name: Cheo Selvey  . Number of children: 4  . Years of education: 14  . Highest education level: Not on file  Occupational History  . Occupation: Retired  Tobacco Use  . Smoking status: Former Smoker    Packs/day: 1.00    Years: 45.00    Pack years: 45.00    Types: Cigarettes    Quit date: 02/02/2001    Years since quitting: 18.8  . Smokeless tobacco: Current User    Types: Chew  Vaping Use  . Vaping Use: Never used  Substance and Sexual Activity  . Alcohol use: No  . Drug use: No  . Sexual activity: Not on file  Other Topics Concern  . Not on file  Social History Narrative   Lives with wife   Caffeine use: coffee, tea   Social Determinants of Health   Financial Resource Strain:   . Difficulty of Paying Living Expenses: Not on file  Food Insecurity:   . Worried About Charity fundraiser in the Last Year: Not on file  . Ran Out of Food in the Last Year: Not on file  Transportation Needs:   . Lack of Transportation (Medical): Not on file  . Lack of Transportation (Non-Medical): Not on file  Physical Activity:   . Days of Exercise per Week: Not on file  . Minutes  of Exercise per  Session: Not on file  Stress:   . Feeling of Stress : Not on file  Social Connections:   . Frequency of Communication with Friends and Family: Not on file  . Frequency of Social Gatherings with Friends and Family: Not on file  . Attends Religious Services: Not on file  . Active Member of Clubs or Organizations: Not on file  . Attends Archivist Meetings: Not on file  . Marital Status: Not on file     Family History: The patient's family history includes Alzheimer's disease in his sister; Heart disease in his father, mother, sister, sister, and sister; Hemolytic uremic syndrome in his mother; Stroke in his father.  ROS:   Please see the history of present illness.    All other systems reviewed and are negative.  EKGs/Labs/Other Studies Reviewed:    The following studies were reviewed today: EKG reveals sinus rhythm and nonspecific ST-T changes.  This was previous EKG.   Recent Labs: 10/31/2019: BUN 18; Creatinine, Ser 1.24; Hemoglobin 15.9; Magnesium 1.8; Platelets 207; Potassium 4.0; Sodium 138  Recent Lipid Panel No results found for: CHOL, TRIG, HDL, CHOLHDL, VLDL, LDLCALC, LDLDIRECT  Physical Exam:    VS:  BP 100/70   Pulse (!) 120   Ht 5\' 7"  (1.702 m)   SpO2 94%   BMI 26.94 kg/m     Wt Readings from Last 3 Encounters:  10/27/19 172 lb (78 kg)  08/03/19 178 lb 9.6 oz (81 kg)  01/28/19 172 lb 12.8 oz (78.4 kg)     GEN: Patient is in no acute distress HEENT: Normal NECK: No JVD; No carotid bruits LYMPHATICS: No lymphadenopathy CARDIAC: Hear sounds regular, 2/6 systolic murmur at the apex. RESPIRATORY:  Clear to auscultation without rales, wheezing or rhonchi  ABDOMEN: Soft, non-tender, non-distended MUSCULOSKELETAL:  No edema; No deformity  SKIN: Warm and dry NEUROLOGIC:  Alert and oriented x 3 PSYCHIATRIC:  Normal affect   Signed, Jenean Lindau, MD  11/25/2019 3:08 PM    [place]

## 2019-11-26 DIAGNOSIS — N400 Enlarged prostate without lower urinary tract symptoms: Secondary | ICD-10-CM | POA: Diagnosis not present

## 2019-11-26 DIAGNOSIS — J449 Chronic obstructive pulmonary disease, unspecified: Secondary | ICD-10-CM | POA: Diagnosis not present

## 2019-11-26 DIAGNOSIS — G2581 Restless legs syndrome: Secondary | ICD-10-CM | POA: Diagnosis not present

## 2019-11-26 DIAGNOSIS — D649 Anemia, unspecified: Secondary | ICD-10-CM | POA: Diagnosis not present

## 2019-11-26 DIAGNOSIS — M5126 Other intervertebral disc displacement, lumbar region: Secondary | ICD-10-CM | POA: Diagnosis not present

## 2019-11-26 DIAGNOSIS — F32A Depression, unspecified: Secondary | ICD-10-CM | POA: Diagnosis not present

## 2019-11-26 DIAGNOSIS — K449 Diaphragmatic hernia without obstruction or gangrene: Secondary | ICD-10-CM | POA: Diagnosis not present

## 2019-11-26 DIAGNOSIS — M26629 Arthralgia of temporomandibular joint, unspecified side: Secondary | ICD-10-CM | POA: Diagnosis not present

## 2019-11-26 DIAGNOSIS — Z6825 Body mass index (BMI) 25.0-25.9, adult: Secondary | ICD-10-CM | POA: Diagnosis not present

## 2019-11-26 DIAGNOSIS — E785 Hyperlipidemia, unspecified: Secondary | ICD-10-CM | POA: Diagnosis not present

## 2019-11-26 DIAGNOSIS — I472 Ventricular tachycardia: Secondary | ICD-10-CM | POA: Diagnosis not present

## 2019-11-26 DIAGNOSIS — Z87310 Personal history of (healed) osteoporosis fracture: Secondary | ICD-10-CM | POA: Diagnosis not present

## 2019-11-26 DIAGNOSIS — K219 Gastro-esophageal reflux disease without esophagitis: Secondary | ICD-10-CM | POA: Diagnosis not present

## 2019-11-26 DIAGNOSIS — F0390 Unspecified dementia without behavioral disturbance: Secondary | ICD-10-CM | POA: Diagnosis not present

## 2019-11-26 DIAGNOSIS — M8008XD Age-related osteoporosis with current pathological fracture, vertebra(e), subsequent encounter for fracture with routine healing: Secondary | ICD-10-CM | POA: Diagnosis not present

## 2019-11-26 DIAGNOSIS — S32010A Wedge compression fracture of first lumbar vertebra, initial encounter for closed fracture: Secondary | ICD-10-CM | POA: Diagnosis not present

## 2019-11-26 DIAGNOSIS — I10 Essential (primary) hypertension: Secondary | ICD-10-CM | POA: Diagnosis not present

## 2019-11-26 DIAGNOSIS — F17220 Nicotine dependence, chewing tobacco, uncomplicated: Secondary | ICD-10-CM | POA: Diagnosis not present

## 2019-11-26 DIAGNOSIS — I2510 Atherosclerotic heart disease of native coronary artery without angina pectoris: Secondary | ICD-10-CM | POA: Diagnosis not present

## 2019-11-26 DIAGNOSIS — J329 Chronic sinusitis, unspecified: Secondary | ICD-10-CM | POA: Diagnosis not present

## 2019-11-26 DIAGNOSIS — E538 Deficiency of other specified B group vitamins: Secondary | ICD-10-CM | POA: Diagnosis not present

## 2019-11-26 DIAGNOSIS — N200 Calculus of kidney: Secondary | ICD-10-CM | POA: Diagnosis not present

## 2019-11-26 DIAGNOSIS — E663 Overweight: Secondary | ICD-10-CM | POA: Diagnosis not present

## 2019-11-26 DIAGNOSIS — Z981 Arthrodesis status: Secondary | ICD-10-CM | POA: Diagnosis not present

## 2019-11-26 DIAGNOSIS — E46 Unspecified protein-calorie malnutrition: Secondary | ICD-10-CM | POA: Diagnosis not present

## 2019-11-26 DIAGNOSIS — G603 Idiopathic progressive neuropathy: Secondary | ICD-10-CM | POA: Diagnosis not present

## 2019-11-26 LAB — TSH: TSH: 2.03 u[IU]/mL (ref 0.450–4.500)

## 2019-11-28 ENCOUNTER — Telehealth: Payer: Self-pay | Admitting: Cardiology

## 2019-11-28 MED ORDER — METOPROLOL SUCCINATE ER 25 MG PO TB24: 12.5000 mg | ORAL_TABLET | Freq: Every day | ORAL | 3 refills | Status: DC

## 2019-11-28 NOTE — Telephone Encounter (Signed)
    Pt c/o medication issue:  1. Name of Medication: metoprolol and   ivabradine (CORLANOR) 5 MG TABS tablet     2. How are you currently taking this medication (dosage and times per day)?   3. Are you having a reaction (difficulty breathing--STAT)?   4. What is your medication issue? Pt's wife would like to ask Dr. Geraldo Pitter which one he wants the pt on, metoprolol tartrate or metoprolol succinate. Also, she wanted to let Dr. Larina Bras know that pt will not take the   ivabradine (CORLANOR) 5 MG TABS tablet   because it cost $500.

## 2019-11-28 NOTE — Telephone Encounter (Signed)
Spoke with Mechele Claude pts wife and told her that we would change the medication from Corelanor to Metoprolol succinate 12.5 mg daily. Mechele Claude verbalized understanding and had no additional questions.

## 2019-11-30 ENCOUNTER — Telehealth: Payer: Self-pay | Admitting: Cardiology

## 2019-11-30 ENCOUNTER — Telehealth: Payer: Self-pay

## 2019-11-30 NOTE — Telephone Encounter (Signed)
Prior Auth started on cover my meds and key number is [id number]

## 2019-11-30 NOTE — Telephone Encounter (Signed)
New message:     Madaline Savage from [facility] calling concering some medication issue with the change the nurse states his Pulse 117 and would like to know if they should be doing something different.

## 2019-11-30 NOTE — Telephone Encounter (Signed)
Spoke with Chris Braun home health nurse and explained that we tried to do Corlanor but because of the cost they were unable to get the medication. Today his HR is elevated. How do you advise?

## 2019-12-01 NOTE — Telephone Encounter (Signed)
This is sinus tachycardia and we really cannot do much for it because of borderline blood pressure.  Please see if they can get Corlanor with any pt assistance program

## 2019-12-02 NOTE — Telephone Encounter (Signed)
Recommendations are per Dr. Geraldo Pitter was given to Chris Braun with home health. No patient assistance available and insurance denied PA.

## 2019-12-05 DIAGNOSIS — R5381 Other malaise: Secondary | ICD-10-CM | POA: Diagnosis not present

## 2019-12-05 DIAGNOSIS — M818 Other osteoporosis without current pathological fracture: Secondary | ICD-10-CM | POA: Diagnosis not present

## 2019-12-05 DIAGNOSIS — Z9181 History of falling: Secondary | ICD-10-CM | POA: Diagnosis not present

## 2019-12-05 DIAGNOSIS — E538 Deficiency of other specified B group vitamins: Secondary | ICD-10-CM | POA: Diagnosis not present

## 2019-12-05 DIAGNOSIS — Z8673 Personal history of transient ischemic attack (TIA), and cerebral infarction without residual deficits: Secondary | ICD-10-CM | POA: Diagnosis not present

## 2019-12-05 DIAGNOSIS — S32010A Wedge compression fracture of first lumbar vertebra, initial encounter for closed fracture: Secondary | ICD-10-CM | POA: Diagnosis not present

## 2019-12-05 DIAGNOSIS — R300 Dysuria: Secondary | ICD-10-CM | POA: Diagnosis not present

## 2019-12-05 DIAGNOSIS — Z79899 Other long term (current) drug therapy: Secondary | ICD-10-CM | POA: Diagnosis not present

## 2019-12-05 DIAGNOSIS — Z87891 Personal history of nicotine dependence: Secondary | ICD-10-CM | POA: Diagnosis not present

## 2019-12-05 DIAGNOSIS — J449 Chronic obstructive pulmonary disease, unspecified: Secondary | ICD-10-CM | POA: Diagnosis not present

## 2019-12-05 DIAGNOSIS — N400 Enlarged prostate without lower urinary tract symptoms: Secondary | ICD-10-CM | POA: Diagnosis not present

## 2019-12-05 DIAGNOSIS — M47817 Spondylosis without myelopathy or radiculopathy, lumbosacral region: Secondary | ICD-10-CM | POA: Diagnosis not present

## 2019-12-05 DIAGNOSIS — G603 Idiopathic progressive neuropathy: Secondary | ICD-10-CM | POA: Diagnosis not present

## 2019-12-05 DIAGNOSIS — J301 Allergic rhinitis due to pollen: Secondary | ICD-10-CM | POA: Diagnosis not present

## 2019-12-05 DIAGNOSIS — G2581 Restless legs syndrome: Secondary | ICD-10-CM | POA: Diagnosis not present

## 2019-12-05 DIAGNOSIS — Z7902 Long term (current) use of antithrombotics/antiplatelets: Secondary | ICD-10-CM | POA: Diagnosis not present

## 2019-12-05 DIAGNOSIS — M47816 Spondylosis without myelopathy or radiculopathy, lumbar region: Secondary | ICD-10-CM | POA: Diagnosis not present

## 2019-12-05 DIAGNOSIS — E785 Hyperlipidemia, unspecified: Secondary | ICD-10-CM | POA: Diagnosis not present

## 2019-12-05 DIAGNOSIS — I2510 Atherosclerotic heart disease of native coronary artery without angina pectoris: Secondary | ICD-10-CM | POA: Diagnosis not present

## 2019-12-05 DIAGNOSIS — M5126 Other intervertebral disc displacement, lumbar region: Secondary | ICD-10-CM | POA: Diagnosis not present

## 2019-12-07 ENCOUNTER — Telehealth: Payer: Self-pay | Admitting: Cardiology

## 2019-12-07 NOTE — Telephone Encounter (Signed)
° ° °  STAT if HR is under 50 or over 120 (normal HR is 60-100 beats per minute)  1) What is your heart rate? 146  2) Do you have a log of your heart rate readings (document readings)?   3) Do you have any other symptoms? Pt's wife said the PT was there and pt couldn't do the exercise because pt's HR was at 146 they would like to get Dr. Julien Nordmann recommendation

## 2019-12-07 NOTE — Telephone Encounter (Signed)
ok 

## 2019-12-07 NOTE — Telephone Encounter (Signed)
Chris Braun (wife per DPR) pts heart rate is 146 and BP 130/70. Chris Braun states that the pt was on 50 mg toprol XL in the past but now it is 12.5 mg. Advised to increase to 25 mg tonight and let us know in the morning what his heart rate and BP is. JoAnne verbalized understanding and had no additional questions.

## 2019-12-08 ENCOUNTER — Telehealth: Payer: Self-pay | Admitting: Cardiology

## 2019-12-08 NOTE — Telephone Encounter (Signed)
Please advise. We tried to put the pt on Corlanor but could not due to cost. Pt was on Lopressor in the nursing home but once home the wife had problems trying to figure out when to give it. Pt is now on 12.5 mg Toprol and took 25 mg last pm due to heart rate. Pt has problems with his BP at times as well.

## 2019-12-08 NOTE — Telephone Encounter (Signed)
New Message:    Chris Braun is calling said Chris Braun called while at pt's home and said his heart rate  was160.    STAT if HR is under 50 or over 120 (normal HR is 60-100 beats per minute)  1) What is your heart rate?160- Home health Aide was  Gibing pt a bath and heart rate was 160  2) Do you have a log of your heart rate readings (document readings)? no   3) Do you have any other symptoms? no

## 2019-12-09 ENCOUNTER — Telehealth: Payer: Self-pay | Admitting: Cardiology

## 2019-12-09 ENCOUNTER — Observation Stay (HOSPITAL_COMMUNITY)
Admission: EM | Admit: 2019-12-09 | Discharge: 2019-12-10 | Disposition: A | Discharge status: Home / Self Care | Payer: PPO | Attending: Internal Medicine | Admitting: Internal Medicine

## 2019-12-09 ENCOUNTER — Encounter (HOSPITAL_COMMUNITY): Payer: Self-pay

## 2019-12-09 ENCOUNTER — Emergency Department (HOSPITAL_COMMUNITY): Payer: PPO

## 2019-12-09 ENCOUNTER — Other Ambulatory Visit: Payer: Self-pay

## 2019-12-09 DIAGNOSIS — I471 Supraventricular tachycardia, unspecified: Secondary | ICD-10-CM

## 2019-12-09 DIAGNOSIS — R410 Disorientation, unspecified: Secondary | ICD-10-CM | POA: Diagnosis not present

## 2019-12-09 DIAGNOSIS — I10 Essential (primary) hypertension: Secondary | ICD-10-CM | POA: Insufficient documentation

## 2019-12-09 DIAGNOSIS — I2510 Atherosclerotic heart disease of native coronary artery without angina pectoris: Secondary | ICD-10-CM | POA: Diagnosis not present

## 2019-12-09 DIAGNOSIS — X58XXXD Exposure to other specified factors, subsequent encounter: Secondary | ICD-10-CM | POA: Insufficient documentation

## 2019-12-09 DIAGNOSIS — R0989 Other specified symptoms and signs involving the circulatory and respiratory systems: Secondary | ICD-10-CM | POA: Diagnosis not present

## 2019-12-09 DIAGNOSIS — Z79899 Other long term (current) drug therapy: Secondary | ICD-10-CM | POA: Diagnosis not present

## 2019-12-09 DIAGNOSIS — I499 Cardiac arrhythmia, unspecified: Secondary | ICD-10-CM | POA: Diagnosis not present

## 2019-12-09 DIAGNOSIS — Z7951 Long term (current) use of inhaled steroids: Secondary | ICD-10-CM | POA: Diagnosis not present

## 2019-12-09 DIAGNOSIS — S32020D Wedge compression fracture of second lumbar vertebra, subsequent encounter for fracture with routine healing: Secondary | ICD-10-CM | POA: Insufficient documentation

## 2019-12-09 DIAGNOSIS — Z96641 Presence of right artificial hip joint: Secondary | ICD-10-CM | POA: Insufficient documentation

## 2019-12-09 DIAGNOSIS — Z7901 Long term (current) use of anticoagulants: Secondary | ICD-10-CM | POA: Diagnosis not present

## 2019-12-09 DIAGNOSIS — J439 Emphysema, unspecified: Secondary | ICD-10-CM | POA: Diagnosis not present

## 2019-12-09 DIAGNOSIS — Z20822 Contact with and (suspected) exposure to covid-19: Secondary | ICD-10-CM | POA: Insufficient documentation

## 2019-12-09 DIAGNOSIS — Z87891 Personal history of nicotine dependence: Secondary | ICD-10-CM | POA: Diagnosis not present

## 2019-12-09 DIAGNOSIS — J449 Chronic obstructive pulmonary disease, unspecified: Secondary | ICD-10-CM | POA: Diagnosis not present

## 2019-12-09 DIAGNOSIS — Z7982 Long term (current) use of aspirin: Secondary | ICD-10-CM | POA: Insufficient documentation

## 2019-12-09 DIAGNOSIS — R404 Transient alteration of awareness: Secondary | ICD-10-CM | POA: Diagnosis not present

## 2019-12-09 DIAGNOSIS — R42 Dizziness and giddiness: Secondary | ICD-10-CM | POA: Diagnosis present

## 2019-12-09 DIAGNOSIS — I4891 Unspecified atrial fibrillation: Secondary | ICD-10-CM | POA: Diagnosis not present

## 2019-12-09 DIAGNOSIS — R0602 Shortness of breath: Secondary | ICD-10-CM | POA: Diagnosis not present

## 2019-12-09 HISTORY — DX: Supraventricular tachycardia, unspecified: I47.10

## 2019-12-09 HISTORY — DX: Supraventricular tachycardia: I47.1

## 2019-12-09 LAB — CBC WITH DIFFERENTIAL/PLATELET
Abs Immature Granulocytes: 0.06 10*3/uL (ref 0.00–0.07)
Basophils Absolute: 0.1 10*3/uL (ref 0.0–0.1)
Basophils Relative: 1 %
Eosinophils Absolute: 0.1 10*3/uL (ref 0.0–0.5)
Eosinophils Relative: 1 %
HCT: 44.6 % (ref 39.0–52.0)
Hemoglobin: 14.9 g/dL (ref 13.0–17.0)
Immature Granulocytes: 1 %
Lymphocytes Relative: 13 %
Lymphs Abs: 1.5 10*3/uL (ref 0.7–4.0)
MCH: 31.2 pg (ref 26.0–34.0)
MCHC: 33.4 g/dL (ref 30.0–36.0)
MCV: 93.3 fL (ref 80.0–100.0)
Monocytes Absolute: 1.3 10*3/uL — ABNORMAL HIGH (ref 0.1–1.0)
Monocytes Relative: 11 %
Neutro Abs: 9.1 10*3/uL — ABNORMAL HIGH (ref 1.7–7.7)
Neutrophils Relative %: 73 %
Platelets: 234 10*3/uL (ref 150–400)
RBC: 4.78 MIL/uL (ref 4.22–5.81)
RDW: 13.6 % (ref 11.5–15.5)
WBC: 12.1 10*3/uL — ABNORMAL HIGH (ref 4.0–10.5)
nRBC: 0 % (ref 0.0–0.2)

## 2019-12-09 LAB — COMPREHENSIVE METABOLIC PANEL
ALT: 19 U/L (ref 0–44)
AST: 25 U/L (ref 15–41)
Albumin: 3.5 g/dL (ref 3.5–5.0)
Alkaline Phosphatase: 103 U/L (ref 38–126)
Anion gap: 12 (ref 5–15)
BUN: 11 mg/dL (ref 8–23)
CO2: 29 mmol/L (ref 22–32)
Calcium: 9.8 mg/dL (ref 8.9–10.3)
Chloride: 96 mmol/L — ABNORMAL LOW (ref 98–111)
Creatinine, Ser: 1.31 mg/dL — ABNORMAL HIGH (ref 0.61–1.24)
GFR, Estimated: 55 mL/min — ABNORMAL LOW (ref >60)
Glucose, Bld: 102 mg/dL — ABNORMAL HIGH (ref 70–99)
Potassium: 4.3 mmol/L (ref 3.5–5.1)
Sodium: 137 mmol/L (ref 135–145)
Total Bilirubin: 1 mg/dL (ref 0.3–1.2)
Total Protein: 6.9 g/dL (ref 6.5–8.1)

## 2019-12-09 LAB — URINALYSIS, ROUTINE W REFLEX MICROSCOPIC
Bilirubin Urine: NEGATIVE
Glucose, UA: NEGATIVE mg/dL
Hgb urine dipstick: NEGATIVE
Ketones, ur: NEGATIVE mg/dL
Leukocytes,Ua: NEGATIVE
Nitrite: NEGATIVE
Protein, ur: NEGATIVE mg/dL
Specific Gravity, Urine: 1.016 (ref 1.005–1.030)
pH: 5 (ref 5.0–8.0)

## 2019-12-09 LAB — RESPIRATORY PANEL BY RT PCR (FLU A&B, COVID)
Influenza A by PCR: NEGATIVE
Influenza B by PCR: NEGATIVE
SARS Coronavirus 2 by RT PCR: NEGATIVE

## 2019-12-09 LAB — TSH: TSH: 1.812 u[IU]/mL (ref 0.350–4.500)

## 2019-12-09 LAB — PROTIME-INR
INR: 1 (ref 0.8–1.2)
Prothrombin Time: 13 seconds (ref 11.4–15.2)

## 2019-12-09 LAB — MAGNESIUM: Magnesium: 1.8 mg/dL (ref 1.7–2.4)

## 2019-12-09 MED ORDER — ALBUTEROL SULFATE HFA 108 (90 BASE) MCG/ACT IN AERS
2.0000 | INHALATION_SPRAY | Freq: Four times a day (QID) | RESPIRATORY_TRACT | Status: DC | PRN
  Filled 2019-12-09: qty 6.7

## 2019-12-09 MED ORDER — OCUVITE-LUTEIN PO CAPS
1.0000 | ORAL_CAPSULE | Freq: Every day | ORAL | Status: DC
  Filled 2019-12-09: qty 1

## 2019-12-09 MED ORDER — IPRATROPIUM-ALBUTEROL 0.5-2.5 (3) MG/3ML IN SOLN: 3.0000 mL | Freq: Two times a day (BID) | RESPIRATORY_TRACT | Status: DC

## 2019-12-09 MED ORDER — ONDANSETRON HCL 4 MG/2ML IJ SOLN
4.0000 mg | Freq: Four times a day (QID) | INTRAMUSCULAR | Status: DC | PRN
  Administered 2019-12-10: 4 mg via INTRAVENOUS
  Filled 2019-12-09: qty 2

## 2019-12-09 MED ORDER — LORATADINE 10 MG PO TABS
10.0000 mg | ORAL_TABLET | Freq: Every day | ORAL | Status: DC
  Administered 2019-12-10: 10 mg via ORAL
  Filled 2019-12-09: qty 1

## 2019-12-09 MED ORDER — ENOXAPARIN SODIUM 40 MG/0.4ML ~~LOC~~ SOLN
40.0000 mg | SUBCUTANEOUS | Status: DC
  Administered 2019-12-09: 40 mg via SUBCUTANEOUS
  Filled 2019-12-09: qty 0.4

## 2019-12-09 MED ORDER — ASPIRIN EC 81 MG PO TBEC
81.0000 mg | DELAYED_RELEASE_TABLET | Freq: Every day | ORAL | Status: DC
  Administered 2019-12-09: 81 mg via ORAL
  Filled 2019-12-09: qty 1

## 2019-12-09 MED ORDER — METHOCARBAMOL 500 MG PO TABS: 500.0000 mg | ORAL_TABLET | Freq: Three times a day (TID) | ORAL | Status: DC

## 2019-12-09 MED ORDER — DILTIAZEM HCL-DEXTROSE 125-5 MG/125ML-% IV SOLN (PREMIX)
5.0000 mg/h | INTRAVENOUS | Status: DC
  Administered 2019-12-09: 5 mg/h via INTRAVENOUS
  Filled 2019-12-09: qty 125

## 2019-12-09 MED ORDER — POLYETHYLENE GLYCOL 3350 17 G PO PACK: 17.0000 g | PACK | Freq: Every day | ORAL | Status: DC | PRN

## 2019-12-09 MED ORDER — DONEPEZIL HCL 5 MG PO TABS
5.0000 mg | ORAL_TABLET | Freq: Every day | ORAL | Status: DC
  Administered 2019-12-09: 5 mg via ORAL
  Filled 2019-12-09 (×2): qty 1

## 2019-12-09 MED ORDER — HYDROCODONE-ACETAMINOPHEN 5-325 MG PO TABS: 1.0000 | ORAL_TABLET | Freq: Four times a day (QID) | ORAL | Status: DC | PRN

## 2019-12-09 MED ORDER — KETOCONAZOLE 2 % EX CREA
1.0000 "application " | TOPICAL_CREAM | Freq: Every day | CUTANEOUS | Status: DC | PRN
  Filled 2019-12-09: qty 15

## 2019-12-09 MED ORDER — FERROUS SULFATE 325 (65 FE) MG PO TABS
325.0000 mg | ORAL_TABLET | Freq: Every evening | ORAL | Status: DC
  Administered 2019-12-09: 325 mg via ORAL
  Filled 2019-12-09: qty 1

## 2019-12-09 MED ORDER — BENZONATATE 100 MG PO CAPS: 200.0000 mg | ORAL_CAPSULE | Freq: Three times a day (TID) | ORAL | Status: DC | PRN

## 2019-12-09 MED ORDER — ONDANSETRON HCL 4 MG PO TABS: 4.0000 mg | ORAL_TABLET | Freq: Four times a day (QID) | ORAL | Status: DC | PRN

## 2019-12-09 MED ORDER — ACETAMINOPHEN 325 MG PO TABS: 650.0000 mg | ORAL_TABLET | Freq: Four times a day (QID) | ORAL | Status: DC | PRN

## 2019-12-09 MED ORDER — CLOPIDOGREL BISULFATE 75 MG PO TABS
75.0000 mg | ORAL_TABLET | Freq: Every day | ORAL | Status: DC
  Administered 2019-12-10: 75 mg via ORAL
  Filled 2019-12-09: qty 1

## 2019-12-09 MED ORDER — FLUTICASONE FUROATE-VILANTEROL 200-25 MCG/INH IN AEPB
1.0000 | INHALATION_SPRAY | Freq: Every day | RESPIRATORY_TRACT | Status: DC
  Filled 2019-12-09: qty 28

## 2019-12-09 MED ORDER — VITAMIN D 25 MCG (1000 UNIT) PO TABS
5000.0000 [IU] | ORAL_TABLET | Freq: Every day | ORAL | Status: DC
  Administered 2019-12-09: 5000 [IU] via ORAL
  Filled 2019-12-09: qty 5

## 2019-12-09 MED ORDER — TAMSULOSIN HCL 0.4 MG PO CAPS
0.4000 mg | ORAL_CAPSULE | Freq: Every day | ORAL | Status: DC
  Administered 2019-12-09: 0.4 mg via ORAL
  Filled 2019-12-09: qty 1

## 2019-12-09 MED ORDER — ROPINIROLE HCL 0.5 MG PO TABS
0.5000 mg | ORAL_TABLET | Freq: Three times a day (TID) | ORAL | Status: DC
  Administered 2019-12-09 – 2019-12-10 (×2): 0.5 mg via ORAL
  Filled 2019-12-09 (×2): qty 1

## 2019-12-09 MED ORDER — GUAIFENESIN ER 600 MG PO TB12
1200.0000 mg | ORAL_TABLET | Freq: Two times a day (BID) | ORAL | Status: DC
  Administered 2019-12-09 – 2019-12-10 (×2): 1200 mg via ORAL
  Filled 2019-12-09: qty 2

## 2019-12-09 MED ORDER — PANTOPRAZOLE SODIUM 40 MG PO TBEC
40.0000 mg | DELAYED_RELEASE_TABLET | Freq: Every day | ORAL | Status: DC
  Administered 2019-12-10: 40 mg via ORAL
  Filled 2019-12-09: qty 1

## 2019-12-09 MED ORDER — ACETAMINOPHEN 650 MG RE SUPP: 650.0000 mg | Freq: Four times a day (QID) | RECTAL | Status: DC | PRN

## 2019-12-09 MED ORDER — MONTELUKAST SODIUM 10 MG PO TABS
10.0000 mg | ORAL_TABLET | Freq: Every day | ORAL | Status: DC
  Administered 2019-12-10: 10 mg via ORAL
  Filled 2019-12-09: qty 1

## 2019-12-09 MED ORDER — ATORVASTATIN CALCIUM 80 MG PO TABS
80.0000 mg | ORAL_TABLET | Freq: Every day | ORAL | Status: DC
  Administered 2019-12-09: 80 mg via ORAL
  Filled 2019-12-09: qty 1

## 2019-12-09 MED ORDER — VITAMIN B-12 1000 MCG PO TABS
2000.0000 ug | ORAL_TABLET | Freq: Every day | ORAL | Status: DC
  Administered 2019-12-10: 2000 ug via ORAL
  Filled 2019-12-09: qty 2

## 2019-12-09 MED ORDER — TRAMADOL HCL 50 MG PO TABS
50.0000 mg | ORAL_TABLET | Freq: Four times a day (QID) | ORAL | Status: DC | PRN
  Administered 2019-12-09: 50 mg via ORAL
  Filled 2019-12-09: qty 1

## 2019-12-09 MED ORDER — ADULT MULTIVITAMIN W/MINERALS CH: 1.0000 | ORAL_TABLET | Freq: Every day | ORAL | Status: DC

## 2019-12-09 NOTE — H&P (Signed)
Date: 12/09/2019               Patient Name:  Chris Braun MRN: [id number]  DOB: 1935-07-08 Age / Sex: 84 y.o., male   PCP: Penelope Coop, FNP         Medical Service: Internal Medicine Teaching Service         Attending Physician: Dr. Joni Reining    First Contact: Dr. Alexandria Lodge Pager: [phone]  Second Contact: Dr. Maudie Mercury Pager: [phone]       After Hours (After 5p/  First Contact Pager: [phone]  weekends / holidays): Second Contact Pager: [phone]   Chief Complaint: Tachycardia  History of Present Illness:   Mr. Tatar is an 84 year old gentleman with history of sinus tachycardia, NSVT/VT s/p ablation in 2008, CAD with remote MI and stenting in 2003, dyslipidemia, hypertension, asthma/COPD, stroke, dementia, severe osteoporosis with multiple thoracic spine compression fractures who presents with tachycardia associated with dizziness, lightheadedness, and confusion.  The patient's wife is at bedside and contributes significantly to this history as the patient has dementia at baseline and has been confused per her report over the last few days.  Patient's wife reports that for the couple of days, his heart rate has been in the 160s, and he has been acting more confused and endorsing dizziness and lightheadedness. Notes that his blood pressure has been low. Reports his heart rate has been elevated in the 140s-150s for the last month. Reports he was recently seen by his cardiologist who told him to continue his meds. Notes he has had mild confusion over the last month which the wife attributed to pain medications for his back pain but now thinks may be related to his elevated heart rate. His home health nurse recommended he come to the hospital today.  Patient does not endorse chest pain, palpitations, shortness of breath, headache, abdominal pain, dysuria, fever/chills, nausea/vomiting. Has a chronic cough from his COPD. No recent medication changes.   He was seen by  his cardiologist on 11/25/19 for tachycardia and was diagnosed with sinus tachycardia. No EKG available from that visit. His TSH was normal and he was continued on his beta blocker.  Admitted from 10/27/19-10/31/19 for acute ambulatory dysfunction due to new onset of pathological fracture from severe osteoporosis and asthma/COPD exacerbation. Atrial tachycardia was noted at that time Was discharged to a SNF for rehab after and since then has had home health physical therapy and a home health aid. He had a fall at rehab and has since been using a wheelchair. Patient got an epidural for his back pain this past Monday.  ED Course: Heart rate in the 150s. EKG with atrial tachycardia vs atrial flutter. BP 110/85, RR 16, 95% O2. Labs showed potassium 4.3, sodium 137, glucose 102, creatinine 1.31 (baseline ~1.2), BUN 11, Mag 1.8, LFTs normal, WBC 12.1, Hgb 14.9. INR 1.0, PT 13.0. TSH 1.812. Respiratory panel negative. Urinalysis unremarkable. CXR showed emphysematous changes and fibrosis. Patient was started on IV cardizem and admitted.    Meds:  Current Meds  Medication Sig  . Acetaminophen 325 MG CAPS Take 975 mg by mouth every 8 (eight) hours as needed (pain).   Marland Kitchen albuterol (PROVENTIL HFA;VENTOLIN HFA) 108 (90 BASE) MCG/ACT inhaler Inhale 2 puffs into the lungs every 6 (six) hours as needed for wheezing.  Marland Kitchen aspirin EC 81 MG tablet Take 81 mg by mouth at bedtime.   Marland Kitchen atorvastatin (LIPITOR) 80 MG tablet Take 80 mg by mouth at  bedtime.   . benzonatate (TESSALON) 200 MG capsule Take 200 mg by mouth in the morning, at noon, and at bedtime.   . calcitonin, salmon, (MIACALCIN/FORTICAL) 200 UNIT/ACT nasal spray Place 1 spray into alternate nostrils at bedtime.   . calcium-vitamin D (OSCAL WITH D) 500-200 MG-UNIT tablet Take 1 tablet by mouth 2 (two) times daily.  . Cholecalciferol (VITAMIN D3) 1.25 MG (50000 UT) TABS Take 5,000 Units by mouth at bedtime.   . clopidogrel (PLAVIX) 75 MG tablet Take 75 mg by mouth  daily.  Marland Kitchen donepezil (ARICEPT) 5 MG tablet Take 5 mg by mouth at bedtime.   . ferrous sulfate 325 (65 FE) MG tablet Take 325 mg by mouth every evening.   Marland Kitchen guaiFENesin (MUCINEX) 600 MG 12 hr tablet Take 1,200 mg by mouth 2 (two) times daily.   Marland Kitchen HYDROcodone-acetaminophen (NORCO/VICODIN) 5-325 MG tablet Take 1 tablet by mouth every 6 (six) hours as needed for moderate pain.  Marland Kitchen ipratropium-albuterol (DUONEB) 0.5-2.5 (3) MG/3ML SOLN Take 3 mLs by nebulization 2 (two) times daily.   Marland Kitchen ketoconazole (NIZORAL) 2 % cream Apply 1 application topically daily as needed for irritation (on feet). Apply as needed to affected area.    . lactulose (CHRONULAC) 10 GM/15ML solution Take 10 g by mouth at bedtime as needed for mild constipation.   Marland Kitchen loperamide (IMODIUM) 2 MG capsule Take 2-4 mg by mouth as needed for diarrhea or loose stools.  Marland Kitchen loratadine (CLARITIN) 10 MG tablet Take 10 mg by mouth in the morning.   . metoprolol succinate (TOPROL XL) 25 MG 24 hr tablet Take 0.5 tablets (12.5 mg total) by mouth daily.  . montelukast (SINGULAIR) 10 MG tablet Take 10 mg by mouth daily.   . Multiple Vitamins-Minerals (MULTIVITAMIN PO) Take 1 tablet by mouth every evening.   . multivitamin-lutein (OCUVITE-LUTEIN) CAPS capsule Take 1 capsule by mouth daily.  . nitroGLYCERIN (NITROSTAT) 0.4 MG SL tablet DISSOLVE 1 TABLET UNDER THE TONGUE EVERY 5 MINUTES AS NEEDED FOR CHEST PAIN. DO NOT EXCEED A TOTAL OF 3 DOSES IN 15 MINUTES. (Patient taking differently: Place 0.4 mg under the tongue every 5 (five) minutes x 3 doses as needed for chest pain. )  . ondansetron (ZOFRAN-ODT) 4 MG disintegrating tablet Take 4 mg by mouth every 8 (eight) hours as needed for nausea or vomiting.   . pantoprazole (PROTONIX) 40 MG tablet Take 40 mg by mouth daily before breakfast.   . potassium chloride (KLOR-CON) 10 MEQ tablet Take 10 mEq by mouth daily as needed (take with lasix).  Marland Kitchen rOPINIRole (REQUIP) 0.5 MG tablet Take 0.5 mg by mouth in the  morning, at noon, and at bedtime.   . SYMBICORT 160-4.5 MCG/ACT inhaler Inhale 2 puffs into the lungs 2 (two) times daily.  . tamsulosin (FLOMAX) 0.4 MG CAPS capsule Take 0.4 mg by mouth daily after supper.  . traMADol (ULTRAM) 50 MG tablet Take 50 mg by mouth every 6 (six) hours as needed for moderate pain.   . vitamin B-12 (CYANOCOBALAMIN) 1000 MCG tablet Take 2,000 mcg by mouth daily.    Social History: Lives with his wife at home. 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 Smoked "a whole lot" for 50 years but switched to chewing tobacco exclusively a few years ago. No alcohol or drug use.   Family History: Mother with kidney disease. Father with heart issues.   Allergies: Allergies as of 12/09/2019 - Review Complete 12/09/2019  Allergen Reaction Noted  . Other Nausea And Vomiting and Other (See Comments)  10/07/2017  . Pneumovax [pneumococcal polysaccharide vaccine] Nausea And Vomiting and Other (See Comments) 10/27/2019  . Ibuprofen Other (See Comments) 10/07/2017  . Norco [hydrocodone-acetaminophen]  10/27/2019  . Oxycodone Other (See Comments) 09/26/2013   Past Medical History:  Diagnosis Date  . Acute hypoxemic respiratory failure ([place]) 05/27/2018  . AKI (acute kidney injury) ([place]) 05/27/2018  . Asthma   . Atrial tachycardia ([place]) 07/26/2018  . B12 deficiency   . CAD (coronary artery disease) 08/24/2014  . Carotid stenosis, bilateral 08/24/2014  . Cataracts, bilateral   . Compression fracture    BAck  . Compression fracture of L2 lumbar vertebra, closed, initial encounter ([place]) 10/27/2019  . COPD (chronic obstructive pulmonary disease) ([place])   . Depression   . Diverticulosis    per colonscopy  . Dyslipidemia 08/24/2014  . Falls frequently 06/03/2015  . Gait abnormality 01/19/2018  . GERD (gastroesophageal reflux disease)   . Hiatal hernia   . High cholesterol   . Hypertension   . Hypoxia   . Idiopathic progressive polyneuropathy   . Kidney stones   . Lumbar vertebral fracture, pathologic 10/27/2019  .  Macular degeneration   . Memory disorder 01/19/2018  . Myocardial infarction ([place]) 2002  . 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 Osteoporosis   . Right kidney stone 05/27/2018  . Right upper lobe pneumonia 05/27/2018  . Risk for falls 05/28/2015  . Status post ablation of ventricular arrhythmia 04/27/2018  . Stroke (cerebrum) ([place]) 01/17/2014  . Stroke ([place])   . Weakness of distal arms and legs 06/03/2015   Review of Systems: A complete ROS was negative except as per HPI.   Physical Exam: Blood pressure 104/89, pulse (!) 135, temperature 98.6 F (37 C), temperature source Oral, resp. rate 16, height 5\' 7"  (1.702 m), weight 73.9 kg, SpO2 95 %. Constitutional: tired-appearing older gentleman lying in bed, in no acute distress HENT: normocephalic atraumatic, mucous membranes moist Eyes: conjunctiva non-erythematous Neck: supple Cardiovascular: tachycardic in the 130s then normal sinus rhythm in the 60s-70s then tachycardic in the 130s, no m/r/g, no JVD, no lower extremity edema Pulmonary/Chest: normal work of breathing on room air, bibasilar crackles Abdominal: soft, non-tender, non-distended MSK: normal bulk and tone Neurological: alert & oriented to person, place (said [facility]), but not time, 5/5 in bilateral grip strength and hip flexion Skin: No rashes or ecchymoses   Labs: CBC    Component Value Date/Time   WBC 12.1 (H) 12/09/2019 1423   RBC 4.78 12/09/2019 1423   HGB 14.9 12/09/2019 1423   HCT 44.6 12/09/2019 1423   PLT 234 12/09/2019 1423   MCV 93.3 12/09/2019 1423   MCH 31.2 12/09/2019 1423   MCHC 33.4 12/09/2019 1423   RDW 13.6 12/09/2019 1423   LYMPHSABS 1.5 12/09/2019 1423   MONOABS 1.3 (H) 12/09/2019 1423   EOSABS 0.1 12/09/2019 1423   BASOSABS 0.1 12/09/2019 1423     CMP     Component Value Date/Time   NA 137 12/09/2019 1423   NA 142 02/10/2019 0000   K 4.3 12/09/2019 1423   CL 96 (L) 12/09/2019 1423   CO2 29 12/09/2019 1423   GLUCOSE  102 (H) 12/09/2019 1423   BUN 11 12/09/2019 1423   BUN 11 02/10/2019 0000   CREATININE 1.31 (H) 12/09/2019 1423   CALCIUM 9.8 12/09/2019 1423   PROT 6.9 12/09/2019 1423   ALBUMIN 3.5 12/09/2019 1423   AST 25 12/09/2019 1423   ALT 19 12/09/2019 1423   ALKPHOS  103 12/09/2019 1423   BILITOT 1.0 12/09/2019 1423   GFRNONAA 55 (L) 12/09/2019 1423   GFRAA >60 10/31/2019 0420    Imaging: DG Chest Portable 1 View  Result Date: 12/09/2019 CLINICAL DATA:  Shortness of breath. EXAM: PORTABLE CHEST 1 VIEW COMPARISON:  10/27/2019 FINDINGS: Low lung volumes on this AP portable exam. Similar appearance of emphysematous change and fibrosis, better assessed on prior CT. No evidence of new consolidation. No visible pleural effusions or pneumothorax. Cardiomediastinal silhouette is within normal limits. Aortic atherosclerosis. IMPRESSION: Similar appearance of emphysematous change and fibrosis, better assessed on prior CT. Electronically Signed   By: Margaretha Sheffield MD   On: 12/09/2019 14:36    EKG: personally reviewed my interpretation is supraventricular tachycardia  Assessment & Plan by Problem: Active Problems:   SVT (supraventricular tachycardia) [facility])   Mr. Koppelman is an 84 year old gentleman with history of sinus tachycardia, NSVT/VT s/p ablation in 2008, CAD with remote MI and stenting in 2003, dyslipidemia, hypertension, asthma/COPD, stroke, dementia, severe osteoporosis with multiple thoracic spine compression fractures who presents with tachycardia associated with dizziness, lightheadedness, and confusion.  Paroxysmal Supraventricular Tachycardia  Presented with SVT with a HR in the 150s. Started on IV diltiazem. During interview patient into NSR in the 60s-70s, dilt was off, then back up to 130s. Extensive cardiac history of CAD w/ MI in 2003 and VT ablation in 2008. Subsequent NSVT and atrial tachycardia since. Most recent echo showed LVEF 50-55%. Cardiology recommend rechecking LVEF to  rule out tachycardia-induced systolic heart failure. If LV is preserved, recommend oral diltiazem or higher dose beta blocker to suppress further episodes. If LV function is worse, cards would recommend beta blocker alone. - Cardiology onboard, appreciate recommendations  - Continue IV diltiazem  - Keep Mg >2 and K>4 - Echo  Compression fracture of lumbar spine Diagnosed 9/21. Been in a wheelchair since. Home pain regimen includes Norco (continued), tramadol (continued), Robaxin (held)  CAD s/p remote MI and stenting in 2003 - continue aspirin/Plavix  COPD - Continue home albuterol, duonebs,   Dementia - Continue home aricept 5 mg  HTN - Holding home metoprolol 25 mg   Iron deficiency anemia - Continue home ferrous sulfate 325 mg   HLD - continue home atorvastatin 80 mg tablet   Diet: Heart Healthy VTE: Enoxaparin IVF: None,None Code: Full  Prior to Admission Living Arrangement: Home, living wife Anticipated Discharge Location: TBD  Dispo: Admit patient to Observation with expected length of stay less than 2 midnights.  Signed: Alexandria Lodge, MD PGY-1 Internal Medicine Teaching Service Pager: [phone] 12/09/2019

## 2019-12-09 NOTE — Consult Note (Addendum)
The patient has been seen in conjunction with Cadence Kathlen Mody, PAC. All aspects of care have been considered and discussed. The patient has been personally interviewed, examined, and all clinical data has been reviewed.   Narrow complex tachycardia broke after starting IV diltiazem, suggesting a reciprocating mechanism. Was definitely not atrial flutter or fibrillation.  Long-term anticoagulation is not indicated.  Recheck LVEF by echo to r/o tachycardia induced systolic HF. Assuming LV is preserved, would use oral diltiazem to suppress further episodes. Alternatively, could try higher dose beta blocker.  If LV function is down, would then recommend beta blocker therapy alone.    Cardiology Consultation:   Patient ID: Chris Braun MRN: [id number]; DOB: 04-Nov-1935  Admit date: 12/09/2019 Date of Consult: 12/09/2019  Primary Care Provider: Penelope Coop, [facility] Cardiologist: Jenean Lindau, MD  [facility] Electrophysiologist:  Will Meredith Leeds, MD    Patient Profile:   Chris Braun is a 84 y.o. male with a hx of dyslipidemia, HTN, sinus tachycardia, NSVT/VT s/p ablation in 2008 at [facility], CAD with remote MI and stenting in 2003, CVA  who is being seen today for the evaluation of Afib at the request of Dr. Alvino Chapel.  History of Present Illness:   [name] has been seen by Dr. Geraldo Pitter and Dr. Curt Bears for the above cardiac issus. He has CAD with MI in 2003 with cath and stenting. Also history of VT ablation in 2008. He had subsequent NSVT and atrial tachycardia on heart monitor and saw EP in 10/2018. Toprol was increased. She had a Myoview in 08/2018 that showed EF 57%, LVEF 55-65%, low risk study. Echo at that time showed LVEF 50-55%, basal and mid inferolateral wall, basal inferoseptal segment and basal inferior abnormal segments, impaired relaxation. Last seen 11/25/19 and he reported tachycardia diagnosed with ST. (No EKG from this date). Already on BB.  TSH was normal.   The patient presented to the ER 12/09/19 for afib. Reported elevated heart rates for the last 2 days. He said he was in the hospital in September 2021 for his back found to have compression fx of his lumbar region medically managed. Atrial tachycardia also noted during that admission. Since then heart rates have been consistently 140-150s. The wife says she asked the doctors multiple times about the heart rate and they said to continue his meds. Over the last 2 days however patient started feeling weak and yesterday became dizzy and confused. No chest pain, LLE, orthopnea. Denies fever, chills, vomiting. Feels nauseous when he takes pain pills for back pain. He has been in a wheel chair since his back fx but can ambulate with assistance. Smoked for many years and then chewed tobacco until September 2021. No alcohol or drug use.   In the ER heart rates in the 150s found to be in ?atrial tachycardia vs aflutter. BP 110/85, RR 16, 95% O2.  Labs showed potassium 4.3, sodium 137, glucose 102, creatinine 1.31, BUN 11, Mag 1.8, LFTs normal, WBC 12.1, Hgb 14.9. INR 1.0, PT 13.0. TSH 1.812. Respiratory panel negative. CXR showed emphysematous changes and fibrosis. Patient was started on IV cardizem and admitted.    Past Medical History:  Diagnosis Date  . Acute hypoxemic respiratory failure ([place]) 05/27/2018  . AKI (acute kidney injury) ([place]) 05/27/2018  . Asthma   . Atrial tachycardia ([place]) 07/26/2018  . B12 deficiency   . CAD (coronary artery disease) 08/24/2014  . Carotid stenosis, bilateral 08/24/2014  . Cataracts, bilateral   .  Compression fracture    BAck  . Compression fracture of L2 lumbar vertebra, closed, initial encounter ([place]) 10/27/2019  . COPD (chronic obstructive pulmonary disease) ([place])   . Depression   . Diverticulosis    per colonscopy  . Dyslipidemia 08/24/2014  . Falls frequently 06/03/2015  . Gait abnormality 01/19/2018  . GERD (gastroesophageal reflux disease)   . Hiatal  hernia   . High cholesterol   . Hypertension   . Hypoxia   . Idiopathic progressive polyneuropathy   . Kidney stones   . Lumbar vertebral fracture, pathologic 10/27/2019  . Macular degeneration   . Memory disorder 01/19/2018  . Myocardial infarction ([place]) 2002  . 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 Osteoporosis   . Right kidney stone 05/27/2018  . Right upper lobe pneumonia 05/27/2018  . Risk for falls 05/28/2015  . Status post ablation of ventricular arrhythmia 04/27/2018  . Stroke (cerebrum) ([place]) 01/17/2014  . Stroke ([place])   . Weakness of distal arms and legs 06/03/2015    Past Surgical History:  Procedure Laterality Date  . CARDIAC CATHETERIZATION  2002,   stents   . CATARACT EXTRACTION Left 2016  . Colonscopy  2012  . CYSTOSCOPY W/ URETERAL STENT PLACEMENT Right 05/30/2018   Procedure: CYSTOSCOPY WITH RETROGRADE PYELOGRAM/URETERAL STENT PLACEMENT;  Surgeon: Irine Seal, MD;  Location: [facility];  Service: Urology;  Laterality: Right;  . HIP FRACTURE SURGERY  2008   right  . HYDROCELE EXCISION  10/12/2014  . LITHOTRIPSY     2007, 2009, 2010, 2011, 2012, 2013  . LUMBAR LAMINECTOMY/DECOMPRESSION MICRODISCECTOMY  03/15/2012   Procedure: LUMBAR LAMINECTOMY/DECOMPRESSION MICRODISCECTOMY 1 LEVEL;  Surgeon: Elaina Hoops, MD;  Location: [facility];  Service: Neurosurgery;  Laterality: Left;  Left lumbar three-four decompressive lumbar laminectomy, discectomy  . MAXIMUM ACCESS (MAS)POSTERIOR LUMBAR INTERBODY FUSION (PLIF) 2 LEVEL N/A 12/08/2012   Procedure: FOR MAXIMUM ACCESS (MAS) POSTERIOR LUMBAR INTERBODY FUSION (PLIF) 2 LEVEL;  Surgeon: Elaina Hoops, MD;  Location: [facility];  Service: Neurosurgery;  Laterality: N/A;  FOR MAXIMUM ACCESS (MAS) POSTERIOR LUMBAR INTERBODY FUSION (PLIF) 2 LEVEL  . ROTATOR CUFF REPAIR  1999   bil   . [place]  . TOTAL HIP ARTHROPLASTY Right 2008  . VT study     with Ablation     Home Medications:  Prior to Admission  medications   Medication Sig Start Date End Date Taking? Authorizing Provider  Acetaminophen 325 MG CAPS Take 975 mg by mouth every 8 (eight) hours as needed (pain).    Yes [provider]  albuterol (PROVENTIL HFA;VENTOLIN HFA) 108 (90 BASE) MCG/ACT inhaler Inhale 2 puffs into the lungs every 6 (six) hours as needed for wheezing.   Yes [provider]  aspirin EC 81 MG tablet Take 81 mg by mouth at bedtime.    Yes [provider]  atorvastatin (LIPITOR) 80 MG tablet Take 80 mg by mouth at bedtime.    Yes [provider]  benzonatate (TESSALON) 200 MG capsule Take 200 mg by mouth in the morning, at noon, and at bedtime.  11/19/19  Yes [provider]  calcitonin, salmon, (MIACALCIN/FORTICAL) 200 UNIT/ACT nasal spray Place 1 spray into alternate nostrils at bedtime.    Yes [provider]  calcium-vitamin D (OSCAL WITH D) 500-200 MG-UNIT tablet Take 1 tablet by mouth 2 (two) times daily.   Yes [provider]  Cholecalciferol (VITAMIN D3) 1.25 MG (50000 UT) TABS  Take 5,000 Units by mouth at bedtime.    Yes [provider]  clopidogrel (PLAVIX) 75 MG tablet Take 75 mg by mouth daily. 06/20/15  Yes [provider]  donepezil (ARICEPT) 5 MG tablet Take 5 mg by mouth at bedtime.  11/05/15  Yes [provider]  ferrous sulfate 325 (65 FE) MG tablet Take 325 mg by mouth every evening.    Yes [provider]  guaiFENesin (MUCINEX) 600 MG 12 hr tablet Take 1,200 mg by mouth 2 (two) times daily.    Yes [provider]  HYDROcodone-acetaminophen (NORCO/VICODIN) 5-325 MG tablet Take 1 tablet by mouth every 6 (six) hours as needed for moderate pain.   Yes [provider]  ipratropium-albuterol (DUONEB) 0.5-2.5 (3) MG/3ML SOLN Take 3 mLs by nebulization 2 (two) times daily.    Yes [provider]  ketoconazole (NIZORAL) 2 % cream Apply 1 application topically daily as needed for irritation (on  feet). Apply as needed to affected area.   04/25/19  Yes [provider]  lactulose (CHRONULAC) 10 GM/15ML solution Take 10 g by mouth at bedtime as needed for mild constipation.    Yes [provider]  loperamide (IMODIUM) 2 MG capsule Take 2-4 mg by mouth as needed for diarrhea or loose stools.   Yes [provider]  loratadine (CLARITIN) 10 MG tablet Take 10 mg by mouth in the morning.    Yes [provider]  metoprolol succinate (TOPROL XL) 25 MG 24 hr tablet Take 0.5 tablets (12.5 mg total) by mouth daily. 11/28/19  Yes Revankar, Reita Cliche, MD  montelukast (SINGULAIR) 10 MG tablet Take 10 mg by mouth daily.    Yes [provider]  Multiple Vitamins-Minerals (MULTIVITAMIN PO) Take 1 tablet by mouth every evening.    Yes [provider]  multivitamin-lutein (OCUVITE-LUTEIN) CAPS capsule Take 1 capsule by mouth daily.   Yes [provider]  nitroGLYCERIN (NITROSTAT) 0.4 MG SL tablet DISSOLVE 1 TABLET UNDER THE TONGUE EVERY 5 MINUTES AS NEEDED FOR CHEST PAIN. DO NOT EXCEED A TOTAL OF 3 DOSES IN 15 MINUTES. Patient taking differently: Place 0.4 mg under the tongue every 5 (five) minutes x 3 doses as needed for chest pain.  06/07/18  Yes Revankar, Reita Cliche, MD  ondansetron (ZOFRAN-ODT) 4 MG disintegrating tablet Take 4 mg by mouth every 8 (eight) hours as needed for nausea or vomiting.  12/07/19  Yes [provider]  pantoprazole (PROTONIX) 40 MG tablet Take 40 mg by mouth daily before breakfast.    Yes [provider]  potassium chloride (KLOR-CON) 10 MEQ tablet Take 10 mEq by mouth daily as needed (take with lasix).   Yes [provider]  rOPINIRole (REQUIP) 0.5 MG tablet Take 0.5 mg by mouth in the morning, at noon, and at bedtime.  09/27/16  Yes [provider]  SYMBICORT 160-4.5 MCG/ACT inhaler Inhale 2 puffs into the lungs 2 (two) times daily. 07/21/16  Yes [provider]  tamsulosin (FLOMAX) 0.4  MG CAPS capsule Take 0.4 mg by mouth daily after supper.   Yes [provider]  traMADol (ULTRAM) 50 MG tablet Take 50 mg by mouth every 6 (six) hours as needed for moderate pain.  11/23/19  Yes [provider]  vitamin B-12 (CYANOCOBALAMIN) 1000 MCG tablet Take 2,000 mcg by mouth daily.   Yes [provider]  ivabradine (CORLANOR) 5 MG TABS tablet Take 1 tablet (5 mg total) by mouth 2 (two) times daily with  a meal. Patient not taking: Reported on 12/09/2019 11/25/19   Revankar, Reita Cliche, MD  methocarbamol (ROBAXIN) 500 MG tablet Take 1 tablet (500 mg total) by mouth 3 (three) times daily. Patient not taking: Reported on 12/09/2019 10/31/19   Geradine Girt, DO  ondansetron (ZOFRAN) 4 MG tablet Take 1 tablet (4 mg total) by mouth every 8 (eight) hours as needed for nausea or vomiting. Patient not taking: Reported on 12/09/2019 10/27/19   Domenic Moras, PA-C  polyethylene glycol (MIRALAX / GLYCOLAX) 17 g packet Take 17 g by mouth daily. Patient not taking: Reported on 12/09/2019 05/31/18   Aline August, MD    Inpatient Medications: Scheduled Meds:  Continuous Infusions: . diltiazem (CARDIZEM) infusion 5 mg/hr (12/09/19 1439)   PRN Meds:   Allergies:    Allergies  Allergen Reactions  . Other Nausea And Vomiting and Other (See Comments)    pneumonia vaccine- chills, vomiting, fever, lost use of legs and body function. Had a fall post inj. (05/25/2013)  . Pneumovax [Pneumococcal Polysaccharide Vaccine] Nausea And Vomiting and Other (See Comments)    Chills, loss of bodily functions, ended up in the ED  . Ibuprofen Other (See Comments)    Heart doctor advised he cannot take this  . Norco [Hydrocodone-Acetaminophen]     Must have Zofran to tolerate this  . Oxycodone Other (See Comments)    Mental status changes and causes sleepwalking    Social History:   Social History   Socioeconomic History  . Marital status: Married    Spouse name: Piercen Covino  . Number  of children: 4  . Years of education: 37  . Highest education level: Not on file  Occupational History  . Occupation: Retired  Tobacco Use  . Smoking status: Former Smoker    Packs/day: 1.00    Years: 45.00    Pack years: 45.00    Types: Cigarettes    Quit date: 02/02/2001    Years since quitting: 18.8  . Smokeless tobacco: Current User    Types: Chew  Vaping Use  . Vaping Use: Never used  Substance and Sexual Activity  . Alcohol use: No  . Drug use: No  . Sexual activity: Not on file  Other Topics Concern  . Not on file  Social History Narrative   Lives with wife   Caffeine use: coffee, tea   Social Determinants of Health   Financial Resource Strain:   . Difficulty of Paying Living Expenses: Not on file  Food Insecurity:   . Worried About Charity fundraiser in the Last Year: Not on file  . Ran Out of Food in the Last Year: Not on file  Transportation Needs:   . Lack of Transportation (Medical): Not on file  . Lack of Transportation (Non-Medical): Not on file  Physical Activity:   . Days of Exercise per Week: Not on file  . Minutes of Exercise per Session: Not on file  Stress:   . Feeling of Stress : Not on file  Social Connections:   . Frequency of Communication with Friends and Family: Not on file  . Frequency of Social Gatherings with Friends and Family: Not on file  . Attends Religious Services: Not on file  . Active Member of Clubs or Organizations: Not on file  . Attends Archivist Meetings: Not on file  . Marital Status: Not on file  Intimate Partner Violence:   . Fear of Current or Ex-Partner: Not on file  . Emotionally  Abused: Not on file  . Physically Abused: Not on file  . Sexually Abused: Not on file    Family History:   Family History  Problem Relation Age of Onset  . Hemolytic uremic syndrome Mother   . Heart disease Mother   . Stroke Father   . Heart disease Father   . Heart disease Sister   . Heart disease Sister   . Heart  disease Sister   . Alzheimer's disease Sister      ROS:  Please see the history of present illness.  All other ROS reviewed and negative.     Physical Exam/Data:   Vitals:   12/09/19 1430 12/09/19 1443 12/09/19 1445 12/09/19 1500  BP: 110/87  114/88 110/85  Pulse: (!) 146  (!) 146 (!) 142  Resp: 19  (!) 23 16  Temp:      TempSrc:      SpO2:      Weight:  73.9 kg    Height:  5\' 7"  (1.702 m)     No intake or output data in the 24 hours ending 12/09/19 1538 Last 3 Weights 12/09/2019 11/25/2019 10/27/2019  Weight (lbs) 163 lb (No Data) 172 lb  Weight (kg) 73.936 kg (No Data) 78.019 kg     Body mass index is 25.53 kg/m.  General:  Well nourished, well developed, in no acute distress HEENT: normal Lymph: no adenopathy Neck: no JVD Endocrine:  No thryomegaly Vascular: No carotid bruits; FA pulses 2+ bilaterally without bruits  Cardiac:  normal S1, S2; RR, tachycardic, no murmur  Lungs:  clear to auscultation bilaterally, no wheezing, rhonchi or rales  Abd: soft, nontender, no hepatomegaly  Ext: no edema Musculoskeletal:  No deformities, BUE and BLE strength normal and equal Skin: warm and dry  Neuro:  CNs 2-12 intact, no focal abnormalities noted Psych:  Normal affect   EKG:  The EKG was personally reviewed and demonstrates:  SVT, 149bpm, rate related changes Telemetry:  Telemetry was personally reviewed and demonstrates:  SVT, atach vs aflutter. Likely SVT given spontaneous conversion to SR and back up to SVT with elevated rates  Relevant CV Studies:  Echo 08/2018  1. Basal and mid inferolateral wall, basal inferoseptal segment, and  basal inferior segment are abnormal.  2. The left ventricle has low normal systolic function, with an ejection  fraction of 50-55%. The cavity size was normal. Left ventricular diastolic  Doppler parameters are consistent with impaired relaxation.  3. The right ventricle has normal systolic function. The cavity was  normal. There is no  increase in right ventricular wall thickness.  4. Mild thickening of the mitral valve leaflet. There is mild mitral  annular calcification present. No evidence of mitral valve stenosis.  5. The aortic valve is tricuspid. Mild thickening of the aortic valve. No  stenosis of the aortic valve.  6. The aortic root, ascending aorta and aortic arch are normal in size  and structure.   Heart monitor 08/2018  Baseline rhythm: Sinus  Minimum heart rate: 40 BPM.  Average heart rate: 72 BPM.  Maximal heart rate 104 BPM.  Atrial arrhythmia: Patient had multiple narrow complex tachycardias the longest of which lasted 3 hours approximately  Ventricular arrhythmia: Multiple episodes of nonsustained ventricular tachycardia the longest lasted 11.9 seconds  Conduction abnormality: None significant  Symptoms: None   Myoview stress test 08/2018  Nuclear stress EF: 57%.  The left ventricular ejection fraction is normal (55-65%).  There was no ST segment deviation noted during  stress.  The study is normal.  This is a low risk study.    Laboratory Data:  High Sensitivity Troponin:  No results for input(s): TROPONINIHS in the last 720 hours.   Chemistry Recent Labs  Lab 12/09/19 1423  NA 137  K 4.3  CL 96*  CO2 29  GLUCOSE 102*  BUN 11  CREATININE 1.31*  CALCIUM 9.8  GFRNONAA 55*  ANIONGAP 12    Recent Labs  Lab 12/09/19 1423  PROT 6.9  ALBUMIN 3.5  AST 25  ALT 19  ALKPHOS 103  BILITOT 1.0   Hematology Recent Labs  Lab 12/09/19 1423  WBC 12.1*  RBC 4.78  HGB 14.9  HCT 44.6  MCV 93.3  MCH 31.2  MCHC 33.4  RDW 13.6  PLT 234   BNPNo results for input(s): BNP, PROBNP in the last 168 hours.  DDimer No results for input(s): DDIMER in the last 168 hours.   Radiology/Studies:  DG Chest Portable 1 View  Result Date: 12/09/2019 CLINICAL DATA:  Shortness of breath. EXAM: PORTABLE CHEST 1 VIEW COMPARISON:  10/27/2019 FINDINGS: Low lung volumes on this AP  portable exam. Similar appearance of emphysematous change and fibrosis, better assessed on prior CT. No evidence of new consolidation. No visible pleural effusions or pneumothorax. Cardiomediastinal silhouette is within normal limits. Aortic atherosclerosis. IMPRESSION: Similar appearance of emphysematous change and fibrosis, better assessed on prior CT. Electronically Signed   By: Margaretha Sheffield MD   On: 12/09/2019 14:36     Assessment and Plan:   Paroxysmal SVT - Presents with elevated heart rates and fatigue for the last 2 days found to be in atrial tachycardia vs aflutter. Unsure how long he has been in it. Wife reported elevated heart rates into the 140s since September 2021. Cardiology office note earlier this month recorded pulse up to 120s however no EKG and he was diagnosed with ST.  Also has hx of atrial tachycardia and NSVT. - In the ED EKG/tele suspicious for atrial tachycardia. Also with history of atach. Will review with MD - Labs showed leukocytosis. Creatinine 1.13 - started on IV cardizem - TSH normal. Keep Mag>2 and K+>4 - Echo from 08/2018 showed LVEF 50-55%, impaired relaxation. Repeat echo - During interview patient went into NSR, likely PSVT. Will eventually need oral rate control. If EF is down no CCB. Can use BB.   Leukocytosis - WBC 12.1 - CXR with no infection - UA ordered - per IM  CAD s/p remote MI and stenting in 2003 - Myoview 08/2018 was normal, EF 57%, low risk, no - No chest pain - Echo as above - PTA aspirin and plavix  HTN - PTA Toprol 12.5mg  daily - Now on IV cardizem and pressures soft. Titrate as able  HLD - continue home atorvastatin  NSVT/VT s/p ablation in 2008 - Toprol XL 12.5mg  daily - PVCs on monitor  For questions or updates, please contact [facility] Please consult [email or web address] for contact info under    Signed, Cadence Ninfa Meeker, PA-C  12/09/2019 3:38 PM

## 2019-12-09 NOTE — Telephone Encounter (Signed)
Morey Hummingbird the patient's home health nurse is calling to inform Dr. Yetta Numbers is being transported by EMS to [facility] hospital for a HR in the 160's, dizziness, weakness, nausea, and hypotension.

## 2019-12-09 NOTE — Telephone Encounter (Signed)
This is quite bizarre.  I do not see EKG from last visit in spite of the fact of his heart rate was 120.  I think he can benefit from monitor asked the patient if they willing to wear Zio patch for a week to precisely determine what the rhythm is

## 2019-12-09 NOTE — ED Provider Notes (Signed)
[facility] EMERGENCY DEPARTMENT Provider Note   CSN: [id number] Arrival date & time: 12/09/19  1352     History Chief Complaint  Patient presents with  . Atrial Fibrillation    Chris Braun is a 84 y.o. male.  HPI Patient presents with higher heart rate weakness and dizziness at times.  Has had a fast heart rate for at least 2 days.  Has been talking with cardiology and some adjustment of the medicines.  However did have a fast heart rate also a couple weeks ago.  Seen by cardiology and diagnosed with sinus tachycardia, although there is not appear to be an EKG at that time.  Now going around 150.  Looks more like an A. fib at this time.  States he just feels sort of weak all over.  Occasional cough.  No swelling in his legs.  No real chest pain.  Some hypotension for EMS pressures in the 90s.    Past Medical History:  Diagnosis Date  . Acute hypoxemic respiratory failure ([place]) 05/27/2018  . AKI (acute kidney injury) ([place]) 05/27/2018  . Asthma   . Atrial tachycardia ([place]) 07/26/2018  . B12 deficiency   . CAD (coronary artery disease) 08/24/2014  . Carotid stenosis, bilateral 08/24/2014  . Cataracts, bilateral   . Compression fracture    BAck  . Compression fracture of L2 lumbar vertebra, closed, initial encounter ([place]) 10/27/2019  . COPD (chronic obstructive pulmonary disease) ([place])   . Depression   . Diverticulosis    per colonscopy  . Dyslipidemia 08/24/2014  . Falls frequently 06/03/2015  . Gait abnormality 01/19/2018  . GERD (gastroesophageal reflux disease)   . Hiatal hernia   . High cholesterol   . Hypertension   . Hypoxia   . Idiopathic progressive polyneuropathy   . Kidney stones   . Lumbar vertebral fracture, pathologic 10/27/2019  . Macular degeneration   . Memory disorder 01/19/2018  . Myocardial infarction ([place]) 2002  . 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 Osteoporosis   . Right kidney stone 05/27/2018  . Right upper lobe pneumonia 05/27/2018   . Risk for falls 05/28/2015  . Status post ablation of ventricular arrhythmia 04/27/2018  . 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 Stroke ([place])   . Weakness of distal arms and legs 06/03/2015    Patient Active Problem List   Diagnosis Date Noted  . Cataracts, bilateral   . Depression   . Kidney stones   . Lumbar vertebral fracture, pathologic 10/27/2019  . Compression fracture of L2 lumbar vertebra, closed, initial encounter ([place]) 10/27/2019  . Hypoxia   . Atrial tachycardia ([place]) 07/26/2018  . Stroke ([place])   . Osteoporosis   . Macular degeneration   . Idiopathic progressive polyneuropathy   . Hypertension   . High cholesterol   . Hiatal hernia   . GERD (gastroesophageal reflux disease)   . Asthma   . COPD (chronic obstructive pulmonary disease) ([place])   . Diverticulosis   . B12 deficiency   . Acute hypoxemic respiratory failure ([place]) 05/27/2018  . AKI (acute kidney injury) ([place]) 05/27/2018  . Right kidney stone 05/27/2018  . Right upper lobe pneumonia 05/27/2018  . Status post ablation of ventricular arrhythmia 04/27/2018  . Memory disorder 01/19/2018  . Gait abnormality 01/19/2018  . 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 Falls frequently 06/03/2015  . Weakness of distal arms and legs 06/03/2015  . Risk for falls 05/28/2015  . CAD (coronary artery disease) 08/24/2014  .  Carotid stenosis, bilateral 08/24/2014  . Dyslipidemia 08/24/2014  . 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 Myocardial infarction [facility]) 2002    Past Surgical History:  Procedure Laterality Date  . CARDIAC CATHETERIZATION  2002,   stents   . CATARACT EXTRACTION Left 2016  . Colonscopy  2012  . CYSTOSCOPY W/ URETERAL STENT PLACEMENT Right 05/30/2018   Procedure: CYSTOSCOPY WITH RETROGRADE PYELOGRAM/URETERAL STENT PLACEMENT;  Surgeon: Irine Seal, MD;  Location: [facility];  Service: Urology;  Laterality: Right;  . HIP FRACTURE SURGERY  2008   right  . HYDROCELE EXCISION  10/12/2014  . LITHOTRIPSY      2007, 2009, 2010, 2011, 2012, 2013  . LUMBAR LAMINECTOMY/DECOMPRESSION MICRODISCECTOMY  03/15/2012   Procedure: LUMBAR LAMINECTOMY/DECOMPRESSION MICRODISCECTOMY 1 LEVEL;  Surgeon: Elaina Hoops, MD;  Location: [facility];  Service: Neurosurgery;  Laterality: Left;  Left lumbar three-four decompressive lumbar laminectomy, discectomy  . MAXIMUM ACCESS (MAS)POSTERIOR LUMBAR INTERBODY FUSION (PLIF) 2 LEVEL N/A 12/08/2012   Procedure: FOR MAXIMUM ACCESS (MAS) POSTERIOR LUMBAR INTERBODY FUSION (PLIF) 2 LEVEL;  Surgeon: Elaina Hoops, MD;  Location: [facility];  Service: Neurosurgery;  Laterality: N/A;  FOR MAXIMUM ACCESS (MAS) POSTERIOR LUMBAR INTERBODY FUSION (PLIF) 2 LEVEL  . ROTATOR CUFF REPAIR  1999   bil   . [place]  . TOTAL HIP ARTHROPLASTY Right 2008  . VT study     with Ablation       Family History  Problem Relation Age of Onset  . Hemolytic uremic syndrome Mother   . Heart disease Mother   . Stroke Father   . Heart disease Father   . Heart disease Sister   . Heart disease Sister   . Heart disease Sister   . Alzheimer's disease Sister     Social History   Tobacco Use  . Smoking status: Former Smoker    Packs/day: 1.00    Years: 45.00    Pack years: 45.00    Types: Cigarettes    Quit date: 02/02/2001    Years since quitting: 18.8  . Smokeless tobacco: Current User    Types: Chew  Vaping Use  . Vaping Use: Never used  Substance Use Topics  . Alcohol use: No  . Drug use: No    Home Medications Prior to Admission medications   Medication Sig Start Date End Date Taking? Authorizing Provider  Acetaminophen 325 MG CAPS Take 975 mg by mouth every 8 (eight) hours as needed (pain).    Yes [provider]  albuterol (PROVENTIL HFA;VENTOLIN HFA) 108 (90 BASE) MCG/ACT inhaler Inhale 2 puffs into the lungs every 6 (six) hours as needed for wheezing.   Yes [provider]  aspirin EC 81 MG tablet Take 81 mg by mouth at bedtime.     Yes [provider]  atorvastatin (LIPITOR) 80 MG tablet Take 80 mg by mouth at bedtime.    Yes [provider]  benzonatate (TESSALON) 200 MG capsule Take 200 mg by mouth in the morning, at noon, and at bedtime.  11/19/19  Yes [provider]  calcitonin, salmon, (MIACALCIN/FORTICAL) 200 UNIT/ACT nasal spray Place 1 spray into alternate nostrils at bedtime.    Yes [provider]  calcium-vitamin D (OSCAL WITH D) 500-200 MG-UNIT tablet Take 1 tablet by mouth 2 (two) times daily.   Yes [provider]  Cholecalciferol (VITAMIN D3) 1.25 MG (50000 UT) TABS Take 5,000 Units by mouth at bedtime.  Yes [provider]  clopidogrel (PLAVIX) 75 MG tablet Take 75 mg by mouth daily. 06/20/15  Yes [provider]  donepezil (ARICEPT) 5 MG tablet Take 5 mg by mouth at bedtime.  11/05/15  Yes [provider]  ferrous sulfate 325 (65 FE) MG tablet Take 325 mg by mouth every evening.    Yes [provider]  guaiFENesin (MUCINEX) 600 MG 12 hr tablet Take 1,200 mg by mouth 2 (two) times daily.    Yes [provider]  HYDROcodone-acetaminophen (NORCO/VICODIN) 5-325 MG tablet Take 1 tablet by mouth every 6 (six) hours as needed for moderate pain.   Yes [provider]  ipratropium-albuterol (DUONEB) 0.5-2.5 (3) MG/3ML SOLN Take 3 mLs by nebulization 2 (two) times daily.    Yes [provider]  ketoconazole (NIZORAL) 2 % cream Apply 1 application topically daily as needed for irritation (on feet). Apply as needed to affected area.   04/25/19  Yes [provider]  lactulose (CHRONULAC) 10 GM/15ML solution Take 10 g by mouth at bedtime as needed for mild constipation.    Yes [provider]  loperamide (IMODIUM) 2 MG capsule Take 2-4 mg by mouth as needed for diarrhea or loose stools.   Yes [provider]  loratadine (CLARITIN) 10 MG tablet Take 10 mg by mouth in the morning.    Yes  [provider]  metoprolol succinate (TOPROL XL) 25 MG 24 hr tablet Take 0.5 tablets (12.5 mg total) by mouth daily. 11/28/19  Yes Revankar, Reita Cliche, MD  montelukast (SINGULAIR) 10 MG tablet Take 10 mg by mouth daily.    Yes [provider]  Multiple Vitamins-Minerals (MULTIVITAMIN PO) Take 1 tablet by mouth every evening.    Yes [provider]  multivitamin-lutein (OCUVITE-LUTEIN) CAPS capsule Take 1 capsule by mouth daily.   Yes [provider]  nitroGLYCERIN (NITROSTAT) 0.4 MG SL tablet DISSOLVE 1 TABLET UNDER THE TONGUE EVERY 5 MINUTES AS NEEDED FOR CHEST PAIN. DO NOT EXCEED A TOTAL OF 3 DOSES IN 15 MINUTES. Patient taking differently: Place 0.4 mg under the tongue every 5 (five) minutes x 3 doses as needed for chest pain.  06/07/18  Yes Revankar, Reita Cliche, MD  ondansetron (ZOFRAN-ODT) 4 MG disintegrating tablet Take 4 mg by mouth every 8 (eight) hours as needed for nausea or vomiting.  12/07/19  Yes [provider]  pantoprazole (PROTONIX) 40 MG tablet Take 40 mg by mouth daily before breakfast.    Yes [provider]  potassium chloride (KLOR-CON) 10 MEQ tablet Take 10 mEq by mouth daily as needed (take with lasix).   Yes [provider]  rOPINIRole (REQUIP) 0.5 MG tablet Take 0.5 mg by mouth in the morning, at noon, and at bedtime.  09/27/16  Yes [provider]  SYMBICORT 160-4.5 MCG/ACT inhaler Inhale 2 puffs into the lungs 2 (two) times daily. 07/21/16  Yes [provider]  tamsulosin (FLOMAX) 0.4 MG CAPS capsule Take 0.4 mg by mouth daily after supper.   Yes [provider]  traMADol (ULTRAM) 50 MG tablet Take 50 mg by mouth every 6 (six) hours as needed for moderate pain.  11/23/19  Yes [provider]  vitamin B-12 (CYANOCOBALAMIN) 1000 MCG tablet Take 2,000 mcg by mouth daily.   Yes [provider]  ivabradine (CORLANOR) 5 MG TABS tablet Take 1 tablet (5 mg total) by mouth 2 (two)  times daily with a meal. Patient not taking: Reported on 12/09/2019 11/25/19  Revankar, Reita Cliche, MD  methocarbamol (ROBAXIN) 500 MG tablet Take 1 tablet (500 mg total) by mouth 3 (three) times daily. Patient not taking: Reported on 12/09/2019 10/31/19   Geradine Girt, DO  ondansetron (ZOFRAN) 4 MG tablet Take 1 tablet (4 mg total) by mouth every 8 (eight) hours as needed for nausea or vomiting. Patient not taking: Reported on 12/09/2019 10/27/19   Domenic Moras, PA-C  polyethylene glycol (MIRALAX / GLYCOLAX) 17 g packet Take 17 g by mouth daily. Patient not taking: Reported on 12/09/2019 05/31/18   Aline August, MD    Allergies    Other, Pneumovax [pneumococcal polysaccharide vaccine], Ibuprofen, Norco [hydrocodone-acetaminophen], and Oxycodone  Review of Systems   Review of Systems  Constitutional: Positive for fatigue. Negative for appetite change and fever.  HENT: Negative for congestion.   Respiratory: Positive for cough and shortness of breath.   Cardiovascular: Negative for leg swelling.  Gastrointestinal: Negative for abdominal pain.  Musculoskeletal: Negative for back pain.  Skin: Negative for rash.  Neurological: Negative for weakness.  Psychiatric/Behavioral: Negative for confusion.    Physical Exam Updated Vital Signs BP 110/85   Pulse (!) 142   Temp 98.6 F (37 C) (Oral)   Resp 16   Ht 5\' 7"  (1.702 m)   Wt 73.9 kg   SpO2 95%   BMI 25.53 kg/m   Physical Exam Vitals and nursing note reviewed.  HENT:     Head: Normocephalic and atraumatic.  Eyes:     Pupils: Pupils are equal, round, and reactive to light.  Cardiovascular:     Rate and Rhythm: Regular rhythm. Tachycardia present.  Pulmonary:     Breath sounds: No wheezing.     Comments: Somewhat harsh breath sounds at the bases. Abdominal:     Tenderness: There is no abdominal tenderness.  Musculoskeletal:        General: No tenderness.     Cervical back: Neck supple.     Comments: Some edema bilateral  lower extremities.  Skin:    General: Skin is warm.     Capillary Refill: Capillary refill takes less than 2 seconds.  Neurological:     Mental Status: He is alert and oriented to person, place, and time.     ED Results / Procedures / Treatments   Labs (all labs ordered are listed, but only abnormal results are displayed) Labs Reviewed  COMPREHENSIVE METABOLIC PANEL - Abnormal; Notable for the following components:      Result Value   Chloride 96 (*)    Glucose, Bld 102 (*)    Creatinine, Ser 1.31 (*)    GFR, Estimated 55 (*)    All other components within normal limits  CBC WITH DIFFERENTIAL/PLATELET - Abnormal; Notable for the following components:   WBC 12.1 (*)    Neutro Abs 9.1 (*)    Monocytes Absolute 1.3 (*)    All other components within normal limits  RESPIRATORY PANEL BY RT PCR (FLU A&B, COVID)  TSH  MAGNESIUM  PROTIME-INR  URINALYSIS, ROUTINE W REFLEX MICROSCOPIC    EKG EKG Interpretation  Date/Time:  Friday December 09 2019 13:59:52 EDT Ventricular Rate:  149 PR Interval:    QRS Duration: 85 QT Interval:  298 QTC Calculation: 470 R Axis:   54 Text Interpretation: Supraventricular tachycardia vs afib Reconfirmed by Davonna Belling [phone]) on 12/09/2019 2:14:38 PM   Radiology DG Chest Portable 1 View  Result Date: 12/09/2019 CLINICAL DATA:  Shortness of breath. EXAM: PORTABLE CHEST 1 VIEW COMPARISON:  10/27/2019 FINDINGS: Low lung volumes on this AP portable exam. Similar appearance of emphysematous change and fibrosis, better assessed on prior CT. No evidence of new consolidation. No visible pleural effusions or pneumothorax. Cardiomediastinal silhouette is within normal limits. Aortic atherosclerosis. IMPRESSION: Similar appearance of emphysematous change and fibrosis, better assessed on prior CT. Electronically Signed   By: Margaretha Sheffield MD   On: 12/09/2019 14:36    Procedures Procedures (including critical care time)  Medications Ordered in  ED Medications  diltiazem (CARDIZEM) 125 mg in dextrose 5% 125 mL (1 mg/mL) infusion (5 mg/hr Intravenous New Bag/Given 12/09/19 1439)    ED Course  I have reviewed the triage vital signs and the nursing notes.  Pertinent labs & imaging results that were available during my care of the patient were reviewed by me and considered in my medical decision making (see chart for details).    MDM Rules/Calculators/A&P                          Patient presents with some shortness of breath and fast heart rate.  Appears to been going for at least 2 days potentially longer.  With this I do not think he is a candidate for ED cardioversion.  Appears to be consistently about 150 bpm.  I think this is likely atrial fibrillation a flutter.  Started on Cardizem drip.  Lab work overall reassuring.  Consulted cardiology and will admit to internal medicine. Chadsvasc of at least 5.  CRITICAL CARE Performed by: Davonna Belling Total critical care time: 30 9 was better than my second minutes Critical care time was exclusive of separately billable procedures and treating other patients. Critical care was necessary to treat or prevent imminent or life-threatening deterioration. Critical care was time spent personally by me on the following activities: development of treatment plan with patient and/or surrogate as well as nursing, discussions with consultants, evaluation of patient's response to treatment, examination of patient, obtaining history from patient or surrogate, ordering and performing treatments and interventions, ordering and review of laboratory studies, ordering and review of radiographic studies, pulse oximetry and re-evaluation of patient's condition.  Final Clinical Impression(s) / ED Diagnoses Final diagnoses:  Atrial fibrillation with RVR [facility])    Rx / DC Orders ED Discharge Orders    None       Davonna Belling, MD 12/09/19 1528

## 2019-12-09 NOTE — ED Notes (Signed)
Attempted x 1  

## 2019-12-09 NOTE — ED Triage Notes (Signed)
BIB EMS for few days of high heart rate. HH RN called EMS and EMS found that pt was in afib RVR. Pt reports intermittent dizziness. EMS also reports increase confusion and weakness.

## 2019-12-10 ENCOUNTER — Observation Stay (HOSPITAL_BASED_OUTPATIENT_CLINIC_OR_DEPARTMENT_OTHER): Payer: PPO

## 2019-12-10 ENCOUNTER — Other Ambulatory Visit (HOSPITAL_COMMUNITY): Payer: PPO

## 2019-12-10 DIAGNOSIS — I471 Supraventricular tachycardia: Secondary | ICD-10-CM

## 2019-12-10 DIAGNOSIS — I340 Nonrheumatic mitral (valve) insufficiency: Secondary | ICD-10-CM

## 2019-12-10 DIAGNOSIS — Z7901 Long term (current) use of anticoagulants: Secondary | ICD-10-CM | POA: Diagnosis not present

## 2019-12-10 DIAGNOSIS — I10 Essential (primary) hypertension: Secondary | ICD-10-CM | POA: Diagnosis not present

## 2019-12-10 DIAGNOSIS — S32020D Wedge compression fracture of second lumbar vertebra, subsequent encounter for fracture with routine healing: Secondary | ICD-10-CM | POA: Diagnosis not present

## 2019-12-10 DIAGNOSIS — Z7982 Long term (current) use of aspirin: Secondary | ICD-10-CM | POA: Diagnosis not present

## 2019-12-10 DIAGNOSIS — I2510 Atherosclerotic heart disease of native coronary artery without angina pectoris: Secondary | ICD-10-CM | POA: Diagnosis not present

## 2019-12-10 DIAGNOSIS — Z7951 Long term (current) use of inhaled steroids: Secondary | ICD-10-CM | POA: Diagnosis not present

## 2019-12-10 DIAGNOSIS — Z96641 Presence of right artificial hip joint: Secondary | ICD-10-CM | POA: Diagnosis not present

## 2019-12-10 DIAGNOSIS — Z87891 Personal history of nicotine dependence: Secondary | ICD-10-CM | POA: Diagnosis not present

## 2019-12-10 DIAGNOSIS — Z20822 Contact with and (suspected) exposure to covid-19: Secondary | ICD-10-CM | POA: Diagnosis not present

## 2019-12-10 DIAGNOSIS — J449 Chronic obstructive pulmonary disease, unspecified: Secondary | ICD-10-CM | POA: Diagnosis not present

## 2019-12-10 DIAGNOSIS — Z79899 Other long term (current) drug therapy: Secondary | ICD-10-CM | POA: Diagnosis not present

## 2019-12-10 LAB — BASIC METABOLIC PANEL
Anion gap: 10 (ref 5–15)
BUN: 12 mg/dL (ref 8–23)
CO2: 28 mmol/L (ref 22–32)
Calcium: 9.1 mg/dL (ref 8.9–10.3)
Chloride: 100 mmol/L (ref 98–111)
Creatinine, Ser: 1.2 mg/dL (ref 0.61–1.24)
GFR, Estimated: >60 mL/min (ref >60)
Glucose, Bld: 105 mg/dL — ABNORMAL HIGH (ref 70–99)
Potassium: 4.1 mmol/L (ref 3.5–5.1)
Sodium: 138 mmol/L (ref 135–145)

## 2019-12-10 LAB — ECHOCARDIOGRAM LIMITED
Height: 67 in
Weight: 2426.12 oz

## 2019-12-10 LAB — CBC
HCT: 38.8 % — ABNORMAL LOW (ref 39.0–52.0)
Hemoglobin: 13.3 g/dL (ref 13.0–17.0)
MCH: 31 pg (ref 26.0–34.0)
MCHC: 34.3 g/dL (ref 30.0–36.0)
MCV: 90.4 fL (ref 80.0–100.0)
Platelets: 212 10*3/uL (ref 150–400)
RBC: 4.29 MIL/uL (ref 4.22–5.81)
RDW: 13.4 % (ref 11.5–15.5)
WBC: 10.5 10*3/uL (ref 4.0–10.5)
nRBC: 0 % (ref 0.0–0.2)

## 2019-12-10 MED ORDER — PROSIGHT PO TABS
1.0000 | ORAL_TABLET | Freq: Every day | ORAL | Status: DC
  Administered 2019-12-10: 1 via ORAL
  Filled 2019-12-10: qty 1

## 2019-12-10 MED ORDER — METOPROLOL SUCCINATE ER 25 MG PO TB24
25.0000 mg | ORAL_TABLET | Freq: Every day | ORAL | Status: DC
  Administered 2019-12-10: 25 mg via ORAL
  Filled 2019-12-10: qty 1

## 2019-12-10 MED ORDER — METOPROLOL SUCCINATE ER 25 MG PO TB24: 25.0000 mg | ORAL_TABLET | Freq: Every day | ORAL | 0 refills | Status: DC

## 2019-12-10 MED ORDER — HYDROCODONE-ACETAMINOPHEN 5-325 MG PO TABS: 1.0000 | ORAL_TABLET | Freq: Four times a day (QID) | ORAL | Status: DC | PRN

## 2019-12-10 NOTE — Discharge Summary (Signed)
Name: Chris Braun MRN: [id number] DOB: 12-Aug-1935 84 y.o. PCP: Penelope Coop, FNP  Date of Admission: 12/09/2019  1:52 PM Date of Discharge: 12/10/2019 Attending Physician: Lucious Groves, DO  Discharge Diagnosis: 1. Atrial Tachycardia 2. Compression fracture of the lumbar spine 3. Coronary Artery Disease Status Post Remote MI and Stenting in 2003  Discharge Medications: Allergies as of 12/10/2019      Reactions   Other Nausea And Vomiting, Other (See Comments)   pneumonia vaccine- chills, vomiting, fever, lost use of legs and body function. Had a fall post inj. (05/25/2013)   Pneumovax [pneumococcal Polysaccharide Vaccine] Nausea And Vomiting, Other (See Comments)   Chills, loss of bodily functions, ended up in the ED   Ibuprofen Other (See Comments)   Heart doctor advised he cannot take this   Norco [hydrocodone-acetaminophen]    Must have Zofran to tolerate this   Oxycodone Other (See Comments)   Mental status changes and causes sleepwalking      Medication List    STOP taking these medications   ivabradine 5 MG Tabs tablet Commonly known as: CORLANOR   ondansetron 4 MG tablet Commonly known as: ZOFRAN     TAKE these medications   Acetaminophen 325 MG Caps Take 975 mg by mouth every 8 (eight) hours as needed (pain).   albuterol 108 (90 Base) MCG/ACT inhaler Commonly known as: VENTOLIN HFA Inhale 2 puffs into the lungs every 6 (six) hours as needed for wheezing.   aspirin EC 81 MG tablet Take 81 mg by mouth at bedtime.   atorvastatin 80 MG tablet Commonly known as: LIPITOR Take 80 mg by mouth at bedtime.   benzonatate 200 MG capsule Commonly known as: TESSALON Take 200 mg by mouth in the morning, at noon, and at bedtime.   calcitonin (salmon) 200 UNIT/ACT nasal spray Commonly known as: MIACALCIN/FORTICAL Place 1 spray into alternate nostrils at bedtime.   calcium-vitamin D 500-200 MG-UNIT tablet Commonly known as: OSCAL WITH D Take 1 tablet by  mouth 2 (two) times daily.   clopidogrel 75 MG tablet Commonly known as: PLAVIX Take 75 mg by mouth daily.   donepezil 5 MG tablet Commonly known as: ARICEPT Take 5 mg by mouth at bedtime.   ferrous sulfate 325 (65 FE) MG tablet Take 325 mg by mouth every evening.   guaiFENesin 600 MG 12 hr tablet Commonly known as: MUCINEX Take 1,200 mg by mouth 2 (two) times daily.   HYDROcodone-acetaminophen 5-325 MG tablet Commonly known as: NORCO/VICODIN Take 1 tablet by mouth every 6 (six) hours as needed for moderate pain.   ipratropium-albuterol 0.5-2.5 (3) MG/3ML Soln Commonly known as: DUONEB Take 3 mLs by nebulization 2 (two) times daily.   ketoconazole 2 % cream Commonly known as: NIZORAL Apply 1 application topically daily as needed for irritation (on feet). Apply as needed to affected area.   lactulose 10 GM/15ML solution Commonly known as: CHRONULAC Take 10 g by mouth at bedtime as needed for mild constipation.   loperamide 2 MG capsule Commonly known as: IMODIUM Take 2-4 mg by mouth as needed for diarrhea or loose stools.   loratadine 10 MG tablet Commonly known as: CLARITIN Take 10 mg by mouth in the morning.   methocarbamol 500 MG tablet Commonly known as: ROBAXIN Take 1 tablet (500 mg total) by mouth 3 (three) times daily.   metoprolol succinate 25 MG 24 hr tablet Commonly known as: TOPROL-XL Take 1 tablet (25 mg total) by mouth daily. Start taking  on: December 11, 2019 What changed: how much to take   montelukast 10 MG tablet Commonly known as: SINGULAIR Take 10 mg by mouth daily.   MULTIVITAMIN PO Take 1 tablet by mouth every evening.   multivitamin-lutein Caps capsule Take 1 capsule by mouth daily.   nitroGLYCERIN 0.4 MG SL tablet Commonly known as: NITROSTAT DISSOLVE 1 TABLET UNDER THE TONGUE EVERY 5 MINUTES AS NEEDED FOR CHEST PAIN. DO NOT EXCEED A TOTAL OF 3 DOSES IN 15 MINUTES. What changed: See the new instructions.   ondansetron 4 MG  disintegrating tablet Commonly known as: ZOFRAN-ODT Take 4 mg by mouth every 8 (eight) hours as needed for nausea or vomiting.   pantoprazole 40 MG tablet Commonly known as: PROTONIX Take 40 mg by mouth daily before breakfast.   polyethylene glycol 17 g packet Commonly known as: MIRALAX / GLYCOLAX Take 17 g by mouth daily.   potassium chloride 10 MEQ tablet Commonly known as: KLOR-CON Take 10 mEq by mouth daily as needed (take with lasix).   rOPINIRole 0.5 MG tablet Commonly known as: REQUIP Take 0.5 mg by mouth in the morning, at noon, and at bedtime.   Symbicort 160-4.5 MCG/ACT inhaler Generic drug: budesonide-formoterol Inhale 2 puffs into the lungs 2 (two) times daily.   tamsulosin 0.4 MG Caps capsule Commonly known as: FLOMAX Take 0.4 mg by mouth daily after supper.   traMADol 50 MG tablet Commonly known as: ULTRAM Take 50 mg by mouth every 6 (six) hours as needed for moderate pain.   vitamin B-12 1000 MCG tablet Commonly known as: CYANOCOBALAMIN Take 2,000 mcg by mouth daily.   Vitamin D3 1.25 MG (50000 UT) Tabs Take 5,000 Units by mouth at bedtime.       Disposition and follow-up:   [name] was discharged from [facility] in Stable condition.  At the hospital follow up visit please address:  Atrial Tachycardia - Follow up with Cardiology appointment  - Continue Toprol-XL 25 mg daily  Compression fracture of the lumbar spine - Continue Norco 5-325 every 6 hours as needed, Tramadol 50 mg every 6 hours as needed, Robaxin 500 mg three times daily  Coronary Artery Disease Status Post Remote MI and Stenting in 2003  2.  Labs / imaging needed at time of follow-up: EKG  3.  Pending labs/ test needing follow-up: None  Follow-up Appointments:  Follow-up Information    Penelope Coop, FNP. Schedule an appointment as soon as possible for a visit in 2 week(s).   Specialty: Family Medicine Contact information: [address] [place] [address] [phone]        Constance Haw, MD .   Specialty: Cardiology Contact information: [place] [address] [phone]        Jenean Lindau, MD .   Specialty: Cardiology Contact information: [place] [address] [place] Hospital Course by problem list: 1. Atrial Tachycardia Patient presented from home with increase increased heart rates in the 160s, with associated altered mental status. His initial EKG was concerning for SVT vs A. Fib, and he was started on a diltiazem drip in the ED in addition to a cardiology consult. His Echo showed a preserved ejection fraction. He converted to Sinus Rhythm, and was started on metoprolol. He was discharged in stable condition, and will follow up with Cardiology and his  PCP.   2. Compression Fracture of the Lumbar Spine Patient's pain was managed on his home dose of Norco and Tramadol. His Robaxin was held during admission, and he was discharged home on his Norco, Tramadol, and Robaxin.    3. Coronary Artery Disease Status Post Remote MI and Stenting in 2003 Patient presented with a history of CAD s/p MI and Stenting in 2003. He was managed with his home dose of ASA and Plavix.   Discharge Vitals:   BP (!) 117/57 (BP Location: Left Arm)   Pulse 61   Temp 98.6 F (37 C) (Oral)   Resp 20   Ht 5\' 7"  (1.702 m)   Wt 68.8 kg   SpO2 92%   BMI 23.75 kg/m   Pertinent Labs, Studies, and Procedures:  Procedure: Limited Echo, Cardiac Doppler and Color Doppler (12/10/2019) IMPRESSIONS  1. Hypokinesis of the basal to mid inferior and inferolateral myocardium.  Left ventricular ejection fraction, by estimation, is 50 to 55%. The left  ventricle has low normal function. The left ventricle demonstrates  regional wall motion abnormalities  (see scoring diagram/findings for description).  2. Right ventricular systolic function is  normal. The right ventricular  size is normal.  3. The mitral valve is normal in structure. Mild mitral valve  regurgitation. No evidence of mitral stenosis.  4. The aortic valve is normal in structure. Aortic valve regurgitation is  not visualized. No aortic stenosis is present.  5. The inferior vena cava is normal in size with greater than 50%  respiratory variability, suggesting right atrial pressure of 3 mmHg.   Discharge Instructions: Discharge Instructions    Call MD for:  difficulty breathing, headache or visual disturbances   Complete by: As directed    Call MD for:  extreme fatigue   Complete by: As directed    Call MD for:  persistant dizziness or light-headedness   Complete by: As directed       Signed: Alexandria Lodge, MD 12/10/2019, 1:19 PM   Pager: [phone]

## 2019-12-10 NOTE — Evaluation (Signed)
Physical Therapy Evaluation Patient Details Name: Chris Braun MRN: [id number] DOB: 07/12/1935 Today's Date: 12/10/2019   History of Present Illness  84 year old gentleman with history of sinus tachycardia, NSVT/VT s/p ablation in 2008, CAD with remote MI and stenting in 2003, dyslipidemia, hypertension, asthma/COPD, stroke, dementia, severe osteoporosis with multiple thoracic spine compression fractures who presents with tachycardia associated with dizziness, lightheadedness, and confusion. Of note pt recently admitted in September 2021 with ambulatory dysfunction 2/2 pathological fracture, went to SNF, and then had a fall after returning home. Since the fall the pt has been utilizing a wheelchair for mobility.  Clinical Impression   Pt presents to PT with deficits in cognition, functional mobility, gait, balance, endurance, strength, power, and awareness. Pt mobilizes in bed well but requires some assistance to transfer for safety and to maintain balance. Pt's spouse reports she assists with all stand pivot transfers at home and the pt utilizes a RW for al of these transfers. The pt has not had a fall at home since leaving rehab and has been mobilizing primarily in a wheelchair. Pt's spouses greatest concern is that the pt does get out of bed sometimes on his own at night. PT recommendation to resolve this issues would be increased caregiver support at night, a bed rail, and an additional bedside commode as the pt often gets up to use the bathroom at night. Pt's spouse reports they already have one bedside commode used in the bathroom and would be unable to purchase a second. Pt will benefit from continued acute PT services to improve mobility quality and awareness and to reduce falls risk. PT recommends discharge home with HHPT and continued 24/7 assistance from spouse at the time of discharge. This PT feels that possible SNF placement would lead to increased confusion and that the pt will be more  successful in a familiar environment.    Follow Up Recommendations Home health PT;Supervision/Assistance - 24 hour    Equipment Recommendations  None recommended by PT (pt would benefit from additional [facility] but likely not covered)    Recommendations for Other Services       Precautions / Restrictions Precautions Precautions: Fall Restrictions Weight Bearing Restrictions: No      Mobility  Bed Mobility Overal bed mobility: Needs Assistance Bed Mobility: Rolling;Sidelying to Sit;Sit to Supine Rolling: Supervision Sidelying to sit: Supervision   Sit to supine: Supervision   General bed mobility comments: use of bed rail    Transfers Overall transfer level: Needs assistance Equipment used: 1 person hand held assist Transfers: Sit to/from Stand;Stand Pivot Transfers Sit to Stand: Min guard Stand pivot transfers: Min guard       General transfer comment: pt utilizing UE support of bedside commode or hand hold of PT  Ambulation/Gait                Stairs            Wheelchair Mobility    Modified Rankin (Stroke Patients Only)       Balance Overall balance assessment: Needs assistance Sitting-balance support: No upper extremity supported;Feet supported Sitting balance-Leahy Scale: Fair     Standing balance support: Single extremity supported Standing balance-Leahy Scale: Poor Standing balance comment: reliant on unilateral UE support and minG                             Pertinent Vitals/Pain Pain Assessment: Faces Faces Pain Scale: Hurts a little bit Pain Location: back  Pain Descriptors / Indicators: Grimacing Pain Intervention(s): Monitored during session    Home Living Family/patient expects to be discharged to:: Private residence Living Arrangements: Spouse/significant other Available Help at Discharge: Family;Available 24 hours/day Type of Home: House Home Access: Stairs to enter Entrance Stairs-Rails: None Entrance  Stairs-Number of Steps: 1 Home Layout: One level Home Equipment: Clinical cytogeneticist - 2 wheels;Bedside commode;Wheelchair - manual      Prior Function Level of Independence: Needs assistance   Gait / Transfers Assistance Needed: pt performs stand pivot transfers to wheelchair/toilet/bed, has not been ambulating since discharge from rehab at the end of september due to back pain and falls history  ADL's / Homemaking Assistance Needed: requires assistance from family        Hand Dominance   Dominant Hand: Right    Extremity/Trunk Assessment   Upper Extremity Assessment Upper Extremity Assessment: Generalized weakness    Lower Extremity Assessment Lower Extremity Assessment: Generalized weakness    Cervical / Trunk Assessment Cervical / Trunk Assessment: Kyphotic  Communication   Communication: No difficulties  Cognition Arousal/Alertness: Awake/alert Behavior During Therapy: WFL for tasks assessed/performed Overall Cognitive Status: Impaired/Different from baseline Area of Impairment: Orientation;Attention;Memory;Following commands;Safety/judgement;Awareness;Problem solving                 Orientation Level: Disoriented to;Time Current Attention Level: Sustained   Following Commands: Follows one step commands consistently Safety/Judgement: Decreased awareness of deficits;Decreased awareness of safety Awareness: Emergent Problem Solving: Slow processing        General Comments General comments (skin integrity, edema, etc.): VSS on RA    Exercises     Assessment/Plan    PT Assessment Patient needs continued PT services  PT Problem List Decreased strength;Decreased activity tolerance;Decreased balance;Decreased mobility;Decreased cognition;Decreased knowledge of use of DME;Decreased safety awareness;Decreased knowledge of precautions;Pain       PT Treatment Interventions DME instruction;Gait training;Stair training;Functional mobility training;Balance  training;Neuromuscular re-education;Patient/family education;Cognitive remediation;Wheelchair mobility training;Therapeutic activities    PT Goals (Current goals can be found in the Care Plan section)  Acute Rehab PT Goals Patient Stated Goal: To go home PT Goal Formulation: With patient/family Time For Goal Achievement: 12/24/19 Potential to Achieve Goals: Fair    Frequency Min 3X/week   Barriers to discharge        Co-evaluation               AM-PAC PT "6 Clicks" Mobility  Outcome Measure Help needed turning from your back to your side while in a flat bed without using bedrails?: None Help needed moving from lying on your back to sitting on the side of a flat bed without using bedrails?: None Help needed moving to and from a bed to a chair (including a wheelchair)?: A Little Help needed standing up from a chair using your arms (e.g., wheelchair or bedside chair)?: A Little Help needed to walk in hospital room?: A Lot Help needed climbing 3-5 steps with a railing? : A Lot 6 Click Score: 18    End of Session   Activity Tolerance: Patient tolerated treatment well Patient left: in bed;with call bell/phone within reach;with bed alarm set;with family/visitor present Nurse Communication: Mobility status PT Visit Diagnosis: Pain;Unsteadiness on feet (R26.81);History of falling (Z91.81);Muscle weakness (generalized) (M62.81) Pain - part of body:  (back)    Time: 0102-7253 PT Time Calculation (min) (ACUTE ONLY): 19 min   Charges:   PT Evaluation $PT Eval Low Complexity: 1 Low          Zenaida Niece, PT,  DPT Acute Rehabilitation Pager: [phone]   Zenaida Niece 12/10/2019, 3:55 PM

## 2019-12-10 NOTE — Progress Notes (Signed)
  Echocardiogram 2D Echocardiogram has been performed.  Chris Braun 12/10/2019, 11:25 AM

## 2019-12-10 NOTE — Progress Notes (Signed)
HD#0 Subjective:   No acute events overnight. EKG around 3 am indicates normal sinus rhythm, rate of 74 bpm.  Patient interviewed alone this morning during rounds. He states he is doing well "apart from being in the hospital." He denies chest pain, shortness of breath. Endorses pain in his low back. He is oriented to person, place (says hospital but thinks we are in [facility]), and time. During our interview, his heart rate is in the 70s, appears normal sinus rhythm on the monitor.   Objective:   Vital signs in last 24 hours: Vitals:   12/09/19 2100 12/09/19 2129 12/10/19 0012 12/10/19 0302  BP: 115/86 94/74 98/75  (!) 102/57  Pulse: 79 75 76   Resp: 19 18 19 20   Temp:    98.7 F (37.1 C)  TempSrc:    Oral  SpO2: 91% 92%    Weight:    68.8 kg  Height:       Physical Exam Constitutional: well-appearing older gentleman lying in bed, in no acute distress HENT: normocephalic atraumatic, mucous membranes moist Cardiovascular: regular rate and rhythm, no m/r/g, no lower extremity edema Pulmonary/Chest: normal work of breathing on room air Abdominal: soft, non-tender, non-distended Neurological: alert & oriented to person, place (hospital), and time  Pertinent Labs: CBC Latest Ref Rng & Units 12/10/2019 12/09/2019 10/31/2019  WBC 4.0 - 10.5 K/uL 10.5 12.1(H) 10.5  Hemoglobin 13.0 - 17.0 g/dL 13.3 14.9 15.9  Hematocrit 39 - 52 % 38.8(L) 44.6 47.8  Platelets 150 - 400 K/uL 212 234 207    CMP Latest Ref Rng & Units 12/10/2019 12/09/2019 10/31/2019  Glucose 70 - 99 mg/dL 105(H) 102(H) 86  BUN 8 - 23 mg/dL 12 11 18   Creatinine 0.61 - 1.24 mg/dL 1.20 1.31(H) 1.24  Sodium 135 - 145 mmol/L 138 137 138  Potassium 3.5 - 5.1 mmol/L 4.1 4.3 4.0  Chloride 98 - 111 mmol/L 100 96(L) 97(L)  CO2 22 - 32 mmol/L 28 29 29   Calcium 8.9 - 10.3 mg/dL 9.1 9.8 9.1  Total Protein 6.5 - 8.1 g/dL - 6.9 -  Total Bilirubin 0.3 - 1.2 mg/dL - 1.0 -  Alkaline Phos 38 - 126 U/L - 103 -  AST 15 - 41  U/L - 25 -  ALT 0 - 44 U/L - 19 -    Assessment/Plan:   Active Problems:   SVT (supraventricular tachycardia) (HCC)   Patient Summary:  Chris Braun is an 84 year old gentleman with history of sinus tachycardia, NSVT/VT s/p ablation in 2008, CAD with remote MI and stenting in 2003, dyslipidemia, hypertension, asthma/COPD, stroke, dementia, severe osteoporosis with multiple thoracic spine compression fractures who presented with tachycardia associated with dizziness, lightheadedness, and confusion found to have supraventricular tachycardia and admitted for rate control and further evaluation.  Atrial tachycardia Patient feels well this morning. Has been in normal sinus rhythm on diltiazem drip. Per cardiology, EKG and telemetry consistent with an atrial tachycardia. Diltiazem drip was stopped. Patient started on metoprolol succinate 25 mg daily which can be titrated up. Patient will need follow-up with electrophysiology at discharge. Patient can likely discharge home today pending echocardiogram. - Cardiology onboard, appreciate recommendations  - Metoprolol succinate 25 mg daily - Keep Mg >2 and K>4 - Echo pending  Compression fracture of lumbar spine Diagnosed 9/21. Been in a wheelchair since. Home pain regimen includes Norco (continued), tramadol (continued), Robaxin (held)  CAD s/p remote MI and stenting in 2003 - continue aspirin/Plavix  COPD - Continue home albuterol,  duonebs  Dementia - Continue home aricept 5 mg  HTN - Holding home metoprolol 25 mg   Iron deficiency anemia - Continue home ferrous sulfate 325 mg   HLD - continue home atorvastatin 80 mg tablet   Diet: Soft; 1500 mL fluid restriction IVF: None,None VTE: Enoxaparin Code: Full  Dispo: Anticipated discharge to Home in 0 days pending echocardiogram and possible further titration of meds.   Please contact the on call pager after 5 pm and on weekends at [phone].  Alexandria Lodge, MD PGY-1  Internal Medicine Teaching Service Pager: [phone] 12/10/2019

## 2019-12-10 NOTE — Progress Notes (Signed)
Cardiology Progress Note  Patient ID: Chris Braun MRN: [id number] DOB: 1935/09/07 Date of Encounter: 12/10/2019  Primary Cardiologist: Jenean Lindau, MD  Subjective   Chief Complaint: None.  HPI: Telemetry shows atrial tachycardia.  I stopped his diltiazem drip.  Start on metoprolol succinate.  Echo pending  ROS:  All other ROS reviewed and negative. Pertinent positives noted in the HPI.     Inpatient Medications  Scheduled Meds:  aspirin EC  81 mg Oral QHS   atorvastatin  80 mg Oral QHS   cholecalciferol  5,000 Units Oral QHS   clopidogrel  75 mg Oral Daily   donepezil  5 mg Oral QHS   enoxaparin (LOVENOX) injection  40 mg Subcutaneous Q24H   ferrous sulfate  325 mg Oral QPM   fluticasone furoate-vilanterol  1 puff Inhalation Daily   guaiFENesin  1,200 mg Oral BID   ipratropium-albuterol  3 mL Nebulization BID   loratadine  10 mg Oral Daily   metoprolol succinate  25 mg Oral Daily   montelukast  10 mg Oral Daily   multivitamin  1 tablet Oral Daily   pantoprazole  40 mg Oral QAC breakfast   rOPINIRole  0.5 mg Oral TID   tamsulosin  0.4 mg Oral QPC supper   vitamin B-12  2,000 mcg Oral Daily   Continuous Infusions:  PRN Meds: acetaminophen **OR** acetaminophen, albuterol, benzonatate, HYDROcodone-acetaminophen, ketoconazole, ondansetron **OR** ondansetron (ZOFRAN) IV, polyethylene glycol, traMADol   Vital Signs   Vitals:   12/10/19 0012 12/10/19 0302 12/10/19 0805 12/10/19 0854  BP: 98/75 (!) 102/57 (!) 98/56 (!) 117/57  Pulse: 76  64 61  Resp: 19 20 18 20   Temp:  98.7 F (37.1 C) 98.6 F (37 C)   TempSrc:  Oral Oral   SpO2:   94% 92%  Weight:  68.8 kg    Height:        Intake/Output Summary (Last 24 hours) at 12/10/2019 0943 Last data filed at 12/10/2019 0600 Gross per 24 hour  Intake --  Output 600 ml  Net -600 ml   Last 3 Weights 12/10/2019 12/09/2019 12/09/2019  Weight (lbs) 151 lb 10.1 oz 153 lb 3.5 oz 163 lb  Weight (kg)  68.78 kg 69.5 kg 73.936 kg      Telemetry  Overnight telemetry shows intermittent atrial tachycardia as well as sinus rhythm, which I personally reviewed.   ECG  The most recent ECG shows atrial tachycardia, which I personally reviewed.   Physical Exam   Vitals:   12/10/19 0012 12/10/19 0302 12/10/19 0805 12/10/19 0854  BP: 98/75 (!) 102/57 (!) 98/56 (!) 117/57  Pulse: 76  64 61  Resp: 19 20 18 20   Temp:  98.7 F (37.1 C) 98.6 F (37 C)   TempSrc:  Oral Oral   SpO2:   94% 92%  Weight:  68.8 kg    Height:         Intake/Output Summary (Last 24 hours) at 12/10/2019 0943 Last data filed at 12/10/2019 0600 Gross per 24 hour  Intake --  Output 600 ml  Net -600 ml    Last 3 Weights 12/10/2019 12/09/2019 12/09/2019  Weight (lbs) 151 lb 10.1 oz 153 lb 3.5 oz 163 lb  Weight (kg) 68.78 kg 69.5 kg 73.936 kg    Body mass index is 23.75 kg/m.  General: Well nourished, well developed, in no acute distress Head: Atraumatic, normal size  Eyes: PEERLA, EOMI  Neck: Supple, no JVD Endocrine: No thryomegaly Cardiac: Normal  S1, S2; RRR; no murmurs, rubs, or gallops Lungs: Clear to auscultation bilaterally, no wheezing, rhonchi or rales  Abd: Soft, nontender, no hepatomegaly  Ext: No edema, pulses 2+ Musculoskeletal: No deformities, BUE and BLE strength normal and equal Skin: Warm and dry, no rashes   Neuro: Alert and oriented to person, place, time, and situation, CNII-XII grossly intact, no focal deficits  Psych: Normal mood and affect   Labs  High Sensitivity Troponin:  No results for input(s): TROPONINIHS in the last 720 hours.   Cardiac EnzymesNo results for input(s): TROPONINI in the last 168 hours. No results for input(s): TROPIPOC in the last 168 hours.  Chemistry Recent Labs  Lab 12/09/19 1423 12/10/19 0422  NA 137 138  K 4.3 4.1  CL 96* 100  CO2 29 28  GLUCOSE 102* 105*  BUN 11 12  CREATININE 1.31* 1.20  CALCIUM 9.8 9.1  PROT 6.9  --   ALBUMIN 3.5  --   AST 25   --   ALT 19  --   ALKPHOS 103  --   BILITOT 1.0  --   GFRNONAA 55* >60  ANIONGAP 12 10    Hematology Recent Labs  Lab 12/09/19 1423 12/10/19 0422  WBC 12.1* 10.5  RBC 4.78 4.29  HGB 14.9 13.3  HCT 44.6 38.8*  MCV 93.3 90.4  MCH 31.2 31.0  MCHC 33.4 34.3  RDW 13.6 13.4  PLT 234 212   BNPNo results for input(s): BNP, PROBNP in the last 168 hours.  DDimer No results for input(s): DDIMER in the last 168 hours.   Radiology  DG Chest Portable 1 View  Result Date: 12/09/2019 CLINICAL DATA:  Shortness of breath. EXAM: PORTABLE CHEST 1 VIEW COMPARISON:  10/27/2019 FINDINGS: Low lung volumes on this AP portable exam. Similar appearance of emphysematous change and fibrosis, better assessed on prior CT. No evidence of new consolidation. No visible pleural effusions or pneumothorax. Cardiomediastinal silhouette is within normal limits. Aortic atherosclerosis. IMPRESSION: Similar appearance of emphysematous change and fibrosis, better assessed on prior CT. Electronically Signed   By: Margaretha Sheffield MD   On: 12/09/2019 14:36    Cardiac Studies  TTE 09/03/2018 1. Basal and mid inferolateral wall, basal inferoseptal segment, and  basal inferior segment are abnormal.  2. The left ventricle has low normal systolic function, with an ejection  fraction of 50-55%. The cavity size was normal. Left ventricular diastolic  Doppler parameters are consistent with impaired relaxation.  3. The right ventricle has normal systolic function. The cavity was  normal. There is no increase in right ventricular wall thickness.  4. Mild thickening of the mitral valve leaflet. There is mild mitral  annular calcification present. No evidence of mitral valve stenosis.  5. The aortic valve is tricuspid. Mild thickening of the aortic valve. No  stenosis of the aortic valve.  6. The aortic root, ascending aorta and aortic arch are normal in size  and structure.   Patient Profile  Chris Braun is a 84  y.o. male with history of CAD status post MI in 2003, atrial tachycardia, nonsustained VT, CVA who was admitted on 12/06/2019 with shortness of breath and narrow complex tachycardia.   Assessment & Plan   1.  Atrial tachycardia -EKG and telemetry are consistent with an atrial tachycardia.  I stopped his diltiazem drip.  This is not need anticoagulation. -We will start him on metoprolol succinate 25 mg daily.  We can titrate up. -We will plan to have him follow-up  with electrophysiology at discharge. -No evidence of volume overload.  Repeat echo pending. -Possibly can be discharged later this afternoon. -He may need to be considered for antirhythm medications before.  Will be important for him to follow-up with EP.  For questions or updates, please contact [place] Please consult [email or web address] for contact info under   Time Spent with Patient: I have spent a total of 25 minutes with patient reviewing hospital notes, telemetry, EKGs, labs and examining the patient as well as establishing an assessment and plan that was discussed with the patient.  > 50% of time was spent in direct patient care.    Signed, Addison Naegeli. Audie Box, [place]  12/10/2019 9:43 AM

## 2019-12-12 ENCOUNTER — Telehealth: Payer: Self-pay

## 2019-12-12 NOTE — Telephone Encounter (Signed)
Pt has been in the hospital and has a FU with Dr. Geraldo Pitter.

## 2019-12-12 NOTE — Telephone Encounter (Signed)
-----   Message from Liliane Shi, [place] sent at 12/10/2019  1:18 PM EDT ----- Regarding: Hosp Follow Up Primary Cardiologist:  Geraldo Pitter Trinidad Curet) DC from hospital on 12/10/2019  Please arrange FU in 1-2 weeks with Dr. Geraldo Pitter. Signed,  Richardson Dopp, PA-C   12/10/2019 1:18 PM

## 2019-12-12 NOTE — Telephone Encounter (Signed)
Appointment made as requested

## 2019-12-13 NOTE — Telephone Encounter (Signed)
As before he should be managed by his doctor in the facility.

## 2019-12-13 NOTE — Telephone Encounter (Signed)
Sherrice, home health nurse, is calling to follow up. She states patient has been discharged and she did a home visit today. Per Sherrice, patient has been tachycardic for the past 2 days. She reports his BP remains normal (112/62). However, his HR was in the 130's today. Patient is asymptomatic/denies pain and discomfort. Sherrice would like to know if patient needs any of his medications adjusted. Please advise.   Phone Number: [phone]

## 2019-12-13 NOTE — Telephone Encounter (Signed)
Pt is at home, not a facility.

## 2019-12-14 ENCOUNTER — Telehealth: Payer: Self-pay | Admitting: Cardiology

## 2019-12-14 MED ORDER — VERAPAMIL HCL 40 MG PO TABS: ORAL_TABLET | ORAL | 3 refills | Status: DC

## 2019-12-14 NOTE — Telephone Encounter (Signed)
Please give me a correct update of current medications.  Also check when her TSH was done last.

## 2019-12-14 NOTE — Telephone Encounter (Signed)
STAT if HR is under 50 or over 120 (normal HR is 60-100 beats per minute)  1) What is your heart rate? 130  2) Do you have a log of your heart rate readings (document readings)? 108 the morning   3) Do you have any other symptoms? asy

## 2019-12-14 NOTE — Telephone Encounter (Signed)
Spoke with home health nurse who states that the BP is 130/64 HR 130. Dr. Geraldo Pitter reviewed med list that was reviewed and found to be correct with home health nurse. RX called in for Verapamil 40 mg twice daily as needed for heart rate greater than 90. Advised to keep a record of BP and HR. Home health nurse verbalized understanding and had no additional questions.

## 2019-12-14 NOTE — Telephone Encounter (Signed)
See additional note. 

## 2019-12-21 DIAGNOSIS — I471 Supraventricular tachycardia: Secondary | ICD-10-CM | POA: Diagnosis not present

## 2019-12-21 DIAGNOSIS — D649 Anemia, unspecified: Secondary | ICD-10-CM | POA: Diagnosis not present

## 2019-12-21 DIAGNOSIS — N400 Enlarged prostate without lower urinary tract symptoms: Secondary | ICD-10-CM | POA: Diagnosis not present

## 2019-12-21 DIAGNOSIS — F0390 Unspecified dementia without behavioral disturbance: Secondary | ICD-10-CM | POA: Diagnosis not present

## 2019-12-21 DIAGNOSIS — M47817 Spondylosis without myelopathy or radiculopathy, lumbosacral region: Secondary | ICD-10-CM | POA: Diagnosis not present

## 2019-12-21 DIAGNOSIS — I10 Essential (primary) hypertension: Secondary | ICD-10-CM | POA: Diagnosis not present

## 2019-12-21 DIAGNOSIS — M549 Dorsalgia, unspecified: Secondary | ICD-10-CM | POA: Diagnosis not present

## 2019-12-21 DIAGNOSIS — E785 Hyperlipidemia, unspecified: Secondary | ICD-10-CM | POA: Diagnosis not present

## 2019-12-21 DIAGNOSIS — E538 Deficiency of other specified B group vitamins: Secondary | ICD-10-CM | POA: Diagnosis not present

## 2019-12-21 DIAGNOSIS — M5126 Other intervertebral disc displacement, lumbar region: Secondary | ICD-10-CM | POA: Diagnosis not present

## 2019-12-21 DIAGNOSIS — I2510 Atherosclerotic heart disease of native coronary artery without angina pectoris: Secondary | ICD-10-CM | POA: Diagnosis not present

## 2019-12-21 DIAGNOSIS — Z79899 Other long term (current) drug therapy: Secondary | ICD-10-CM | POA: Diagnosis not present

## 2019-12-21 DIAGNOSIS — J449 Chronic obstructive pulmonary disease, unspecified: Secondary | ICD-10-CM | POA: Diagnosis not present

## 2019-12-23 ENCOUNTER — Encounter: Payer: Self-pay | Admitting: Cardiology

## 2019-12-23 ENCOUNTER — Other Ambulatory Visit: Payer: Self-pay

## 2019-12-23 ENCOUNTER — Ambulatory Visit (INDEPENDENT_AMBULATORY_CARE_PROVIDER_SITE_OTHER): Payer: PPO | Admitting: Cardiology

## 2019-12-23 VITALS — BP 123/83 | HR 120 | Ht 67.0 in | Wt 156.0 lb

## 2019-12-23 DIAGNOSIS — Z9889 Other specified postprocedural states: Secondary | ICD-10-CM

## 2019-12-23 DIAGNOSIS — Z9181 History of falling: Secondary | ICD-10-CM

## 2019-12-23 DIAGNOSIS — E7800 Pure hypercholesterolemia, unspecified: Secondary | ICD-10-CM | POA: Diagnosis not present

## 2019-12-23 DIAGNOSIS — Z8679 Personal history of other diseases of the circulatory system: Secondary | ICD-10-CM

## 2019-12-23 DIAGNOSIS — I471 Supraventricular tachycardia: Secondary | ICD-10-CM

## 2019-12-23 DIAGNOSIS — I2510 Atherosclerotic heart disease of native coronary artery without angina pectoris: Secondary | ICD-10-CM | POA: Diagnosis not present

## 2019-12-23 MED ORDER — DIGOXIN 125 MCG PO TABS: 0.1250 mg | ORAL_TABLET | ORAL | 3 refills | Status: DC

## 2019-12-23 NOTE — Addendum Note (Signed)
Addended by: Truddie Hidden on: 12/23/2019 09:43 AM   Modules accepted: Orders

## 2019-12-23 NOTE — Progress Notes (Signed)
Cardiology Office Note:    Date:  12/23/2019   ID:  Chris Braun, DOB [birth year], MRN [id number]  PCP:  Chris Coop, FNP  Cardiologist:  Chris Lindau, MD   Referring MD: Chris Coop, FNP    ASSESSMENT:    1. Atrial tachycardia ([place])   2. Coronary artery disease involving native coronary artery of native heart without angina pectoris   3. High cholesterol   4. Risk for falls   5. Status post ablation of ventricular arrhythmia    PLAN:    In order of problems listed above:  1. EKG reveals possible atrial tachycardia.  He always has tachycardic and has elevated heart rate.  His TSH is normal.  We will try him on beta-blocker but he could not tolerate because of low blood pressure.  He is now taking a low-dose of verapamil.  I will initiate him on digoxin 0.125 mg every other day and he is agreeable.  Benefits and potential risks explained to him and his wife vocalized understanding.  He will be back in 2 weeks for follow-up appointment. 2. Coronary artery disease stable at this time.  Medical management 3. Possible atrial fibrillation episode: Patient is on appropriate medication.  He is not on anticoagulation because of his high fall potential and it could be against him as well as benefit risk ratio. 4. Mixed dyslipidemia: On statin therapy and diet was emphasized. 5. Follow-up appointment in 2 weeks or earlier if he has any concerns.  Patient and wife had multiple questions which were answered to his satisfaction.   Medication Adjustments/Labs and Tests Ordered: Current medicines are reviewed at length with the patient today.  Concerns regarding medicines are outlined above.  No orders of the defined types were placed in this encounter.  No orders of the defined types were placed in this encounter.    No chief complaint on file.    History of Present Illness:    Chris Braun is a 84 y.o. male.  Patient has past medical history of coronary artery disease.  He  has had significant sinus tachycardia and now it appears that he has atrial tachycardia and he was in the hospital.  There is a mention of atrial tachycardia and possible atrial fibrillation.  There is also history of SVT noted by EKG done in the recent past.  Denies any problems.  He has issues of confusion and dementia.  His wife mentions to me that she has to take care of him full-time.  He is brought in with a wheelchair.  He has high fall potential.  At the time of my evaluation, the patient is alert awake oriented and in no distress.  Past Medical History:  Diagnosis Date  . Acute hypoxemic respiratory failure ([place]) 05/27/2018  . AKI (acute kidney injury) ([place]) 05/27/2018  . Asthma   . Atrial tachycardia ([place]) 07/26/2018  . B12 deficiency   . CAD (coronary artery disease) 08/24/2014  . Carotid stenosis, bilateral 08/24/2014  . Cataracts, bilateral   . Compression fracture    BAck  . Compression fracture of L2 lumbar vertebra, closed, initial encounter ([place]) 10/27/2019  . COPD (chronic obstructive pulmonary disease) ([place])   . Depression   . Diverticulosis    per colonscopy  . Dyslipidemia 08/24/2014  . Falls frequently 06/03/2015  . Gait abnormality 01/19/2018  . GERD (gastroesophageal reflux disease)   . Hiatal hernia   . High cholesterol   . Hypertension   . Hypoxia   .  Idiopathic progressive polyneuropathy   . Kidney stones   . Lumbar vertebral fracture, pathologic 10/27/2019  . Macular degeneration   . Memory disorder 01/19/2018  . Myocardial infarction ([place]) 2002  . 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 Osteoporosis   . Right kidney stone 05/27/2018  . Right upper lobe pneumonia 05/27/2018  . Risk for falls 05/28/2015  . Status post ablation of ventricular arrhythmia 04/27/2018  . 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 Stroke ([place])   . Weakness of distal arms and legs 06/03/2015    Past Surgical History:  Procedure Laterality Date  . CARDIAC CATHETERIZATION  2002,   stents   .  CATARACT EXTRACTION Left 2016  . Colonscopy  2012  . CYSTOSCOPY W/ URETERAL STENT PLACEMENT Right 05/30/2018   Procedure: CYSTOSCOPY WITH RETROGRADE PYELOGRAM/URETERAL STENT PLACEMENT;  Surgeon: Irine Seal, MD;  Location: [facility];  Service: Urology;  Laterality: Right;  . HIP FRACTURE SURGERY  2008   right  . HYDROCELE EXCISION  10/12/2014  . LITHOTRIPSY     2007, 2009, 2010, 2011, 2012, 2013  . LUMBAR LAMINECTOMY/DECOMPRESSION MICRODISCECTOMY  03/15/2012   Procedure: LUMBAR LAMINECTOMY/DECOMPRESSION MICRODISCECTOMY 1 LEVEL;  Surgeon: Elaina Hoops, MD;  Location: [facility];  Service: Neurosurgery;  Laterality: Left;  Left lumbar three-four decompressive lumbar laminectomy, discectomy  . MAXIMUM ACCESS (MAS)POSTERIOR LUMBAR INTERBODY FUSION (PLIF) 2 LEVEL N/A 12/08/2012   Procedure: FOR MAXIMUM ACCESS (MAS) POSTERIOR LUMBAR INTERBODY FUSION (PLIF) 2 LEVEL;  Surgeon: Elaina Hoops, MD;  Location: [facility];  Service: Neurosurgery;  Laterality: N/A;  FOR MAXIMUM ACCESS (MAS) POSTERIOR LUMBAR INTERBODY FUSION (PLIF) 2 LEVEL  . ROTATOR CUFF REPAIR  1999   bil   . [place]  . TOTAL HIP ARTHROPLASTY Right 2008  . VT study     with Ablation    Current Medications: Current Meds  Medication Sig  . Acetaminophen 325 MG CAPS Take 975 mg by mouth every 8 (eight) hours as needed (pain).   Marland Kitchen albuterol (PROVENTIL HFA;VENTOLIN HFA) 108 (90 BASE) MCG/ACT inhaler Inhale 2 puffs into the lungs every 6 (six) hours as needed for wheezing.  Marland Kitchen aspirin EC 81 MG tablet Take 81 mg by mouth at bedtime.   Marland Kitchen atorvastatin (LIPITOR) 80 MG tablet Take 80 mg by mouth at bedtime.   . benzonatate (TESSALON) 200 MG capsule Take 200 mg by mouth in the morning, at noon, and at bedtime.   . calcitonin, salmon, (MIACALCIN/FORTICAL) 200 UNIT/ACT nasal spray Place 1 spray into alternate nostrils at bedtime.   . calcium-vitamin D (OSCAL WITH D) 500-200 MG-UNIT tablet Take 1 tablet by mouth 2 (two)  times daily.  . Cholecalciferol (VITAMIN D3) 1.25 MG (50000 UT) TABS Take 5,000 Units by mouth at bedtime.   . clopidogrel (PLAVIX) 75 MG tablet Take 75 mg by mouth daily.  Marland Kitchen donepezil (ARICEPT) 5 MG tablet Take 5 mg by mouth at bedtime.   . ferrous sulfate 325 (65 FE) MG tablet Take 325 mg by mouth every evening.   Marland Kitchen guaiFENesin (MUCINEX) 600 MG 12 hr tablet Take 1,200 mg by mouth 2 (two) times daily.   Marland Kitchen HYDROcodone-acetaminophen (NORCO/VICODIN) 5-325 MG tablet Take 1 tablet by mouth every 6 (six) hours as needed for moderate pain.  Marland Kitchen ipratropium-albuterol (DUONEB) 0.5-2.5 (3) MG/3ML SOLN Take 3 mLs by nebulization 2 (two) times daily.   Marland Kitchen ketoconazole (NIZORAL) 2 % cream Apply 1 application topically daily as needed for irritation (  on feet). Apply as needed to affected area.    . lactulose (CHRONULAC) 10 GM/15ML solution Take 10 g by mouth at bedtime as needed for mild constipation.   Marland Kitchen loperamide (IMODIUM) 2 MG capsule Take 2-4 mg by mouth as needed for diarrhea or loose stools.  Marland Kitchen loratadine (CLARITIN) 10 MG tablet Take 10 mg by mouth in the morning.   . methocarbamol (ROBAXIN) 500 MG tablet Take 1 tablet (500 mg total) by mouth 3 (three) times daily.  . metoprolol succinate (TOPROL-XL) 25 MG 24 hr tablet Take 1 tablet (25 mg total) by mouth daily.  . montelukast (SINGULAIR) 10 MG tablet Take 10 mg by mouth daily.   . Multiple Vitamins-Minerals (MULTIVITAMIN PO) Take 1 tablet by mouth every evening.   . multivitamin-lutein (OCUVITE-LUTEIN) CAPS capsule Take 1 capsule by mouth daily.  . nitroGLYCERIN (NITROSTAT) 0.4 MG SL tablet DISSOLVE 1 TABLET UNDER THE TONGUE EVERY 5 MINUTES AS NEEDED FOR CHEST PAIN. DO NOT EXCEED A TOTAL OF 3 DOSES IN 15 MINUTES. (Patient taking differently: Place 0.4 mg under the tongue every 5 (five) minutes x 3 doses as needed for chest pain. )  . ondansetron (ZOFRAN-ODT) 4 MG disintegrating tablet Take 4 mg by mouth every 8 (eight) hours as needed for nausea or  vomiting.   . pantoprazole (PROTONIX) 40 MG tablet Take 40 mg by mouth daily before breakfast.   . polyethylene glycol (MIRALAX / GLYCOLAX) 17 g packet Take 17 g by mouth daily.  . potassium chloride (KLOR-CON) 10 MEQ tablet Take 10 mEq by mouth daily as needed (take with lasix).  Marland Kitchen rOPINIRole (REQUIP) 0.5 MG tablet Take 0.5 mg by mouth in the morning, at noon, and at bedtime.   . SYMBICORT 160-4.5 MCG/ACT inhaler Inhale 2 puffs into the lungs 2 (two) times daily.  . tamsulosin (FLOMAX) 0.4 MG CAPS capsule Take 0.4 mg by mouth daily after supper.  . traMADol (ULTRAM) 50 MG tablet Take 50 mg by mouth every 6 (six) hours as needed for moderate pain.   . verapamil (CALAN) 40 MG tablet Take one tablet twice daily as needed for heart rate greater than 90.  . vitamin B-12 (CYANOCOBALAMIN) 1000 MCG tablet Take 2,000 mcg by mouth daily.     Allergies:   Other, Pneumovax [pneumococcal polysaccharide vaccine], Ibuprofen, Norco [hydrocodone-acetaminophen], and Oxycodone   Social History   Socioeconomic History  . Marital status: Married    Spouse name: Elizer Bostic  . Number of children: 4  . Years of education: 54  . Highest education level: Not on file  Occupational History  . Occupation: Retired  Tobacco Use  . Smoking status: Former Smoker    Packs/day: 1.00    Years: 45.00    Pack years: 45.00    Types: Cigarettes    Quit date: 02/02/2001    Years since quitting: 18.8  . Smokeless tobacco: Current User    Types: Chew  Vaping Use  . Vaping Use: Never used  Substance and Sexual Activity  . Alcohol use: No  . Drug use: No  . Sexual activity: Not on file  Other Topics Concern  . Not on file  Social History Narrative   Lives with wife   Caffeine use: coffee, tea   Social Determinants of Health   Financial Resource Strain:   . Difficulty of Paying Living Expenses: Not on file  Food Insecurity:   . Worried About Charity fundraiser in the Last Year: Not on file  . Ran  Out of  Food in the Last Year: Not on file  Transportation Needs:   . Lack of Transportation (Medical): Not on file  . Lack of Transportation (Non-Medical): Not on file  Physical Activity:   . Days of Exercise per Week: Not on file  . Minutes of Exercise per Session: Not on file  Stress:   . Feeling of Stress : Not on file  Social Connections:   . Frequency of Communication with Friends and Family: Not on file  . Frequency of Social Gatherings with Friends and Family: Not on file  . Attends Religious Services: Not on file  . Active Member of Clubs or Organizations: Not on file  . Attends Archivist Meetings: Not on file  . Marital Status: Not on file     Family History: The patient's family history includes Alzheimer's disease in his sister; Heart disease in his father, mother, sister, sister, and sister; Hemolytic uremic syndrome in his mother; Stroke in his father.  ROS:   Please see the history of present illness.    All other systems reviewed and are negative.  EKGs/Labs/Other Studies Reviewed:    The following studies were reviewed today: IMPRESSIONS    1. Hypokinesis of the basal to mid inferior and inferolateral myocardium.  Left ventricular ejection fraction, by estimation, is 50 to 55%. The left  ventricle has low normal function. The left ventricle demonstrates  regional wall motion abnormalities  (see scoring diagram/findings for description).  2. Right ventricular systolic function is normal. The right ventricular  size is normal.  3. The mitral valve is normal in structure. Mild mitral valve  regurgitation. No evidence of mitral stenosis.  4. The aortic valve is normal in structure. Aortic valve regurgitation is  not visualized. No aortic stenosis is present.  5. The inferior vena cava is normal in size with greater than 50%  respiratory variability, suggesting right atrial pressure of 3 mmHg.    Recent Labs: 12/09/2019: ALT 19; Magnesium 1.8; TSH  1.812 12/10/2019: BUN 12; Creatinine, Ser 1.20; Hemoglobin 13.3; Platelets 212; Potassium 4.1; Sodium 138  Recent Lipid Panel No results found for: CHOL, TRIG, HDL, CHOLHDL, VLDL, LDLCALC, LDLDIRECT  Physical Exam:    VS:  BP 123/83   Pulse (!) 120   Ht 5\' 7"  (1.702 m)   Wt 156 lb (70.8 kg)   SpO2 98%   BMI 24.43 kg/m     Wt Readings from Last 3 Encounters:  12/23/19 156 lb (70.8 kg)  12/10/19 151 lb 10.1 oz (68.8 kg)  10/27/19 172 lb (78 kg)     GEN: Patient is in no acute distress HEENT: Normal NECK: No JVD; No carotid bruits LYMPHATICS: No lymphadenopathy CARDIAC: Hear sounds regular, 2/6 systolic murmur at the apex. RESPIRATORY:  Clear to auscultation without rales, wheezing or rhonchi  ABDOMEN: Soft, non-tender, non-distended MUSCULOSKELETAL:  No edema; No deformity  SKIN: Warm and dry NEUROLOGIC:  Alert and oriented x 3 PSYCHIATRIC:  Normal affect   Signed, Chris Lindau, MD  12/23/2019 9:36 AM    [facility]

## 2019-12-23 NOTE — Patient Instructions (Signed)
Medication Instructions:  Your physician has recommended you make the following change in your medication:   Take Digoxin 0.125 mg every other day.  *If you need a refill on your cardiac medications before your next appointment, please call your pharmacy*   Lab Work: None ordered If you have labs (blood work) drawn today and your tests are completely normal, you will receive your results only by: Marland Kitchen MyChart Message (if you have MyChart) OR . A paper copy in the mail If you have any lab test that is abnormal or we need to change your treatment, we will call you to review the results.   Testing/Procedures: None ordered   Follow-Up: At [facility], you and your health needs are our priority.  As part of our continuing mission to provide you with exceptional heart care, we have created designated Provider Care Teams.  These Care Teams include your primary Cardiologist (physician) and Advanced Practice Providers (APPs -  Physician Assistants and Nurse Practitioners) who all work together to provide you with the care you need, when you need it.  We recommend signing up for the patient portal called "MyChart".  Sign up information is provided on this After Visit Summary.  MyChart is used to connect with patients for Virtual Visits (Telemedicine).  Patients are able to view lab/test results, encounter notes, upcoming appointments, etc.  Non-urgent messages can be sent to your provider as well.   To learn more about what you can do with MyChart, go to [email or web address].    Your next appointment:   2 week(s)  The format for your next appointment:   In Person  Provider:   Jyl Heinz, MD   Other Instructions Digoxin tablets or capsules What is this medicine? DIGOXIN (di JOX in) is used to treat congestive heart failure and heart rhythm problems. This medicine may be used for other purposes; ask your health care provider or pharmacist if you have questions. COMMON BRAND  NAME(S): Digitek, Lanoxicaps, Lanoxin What should I tell my health care provider before I take this medicine? They need to know if you have any of these conditions:  certain heart rhythm disorders  heart disease or recent heart attack  kidney or liver disease  an unusual or allergic reaction to digoxin, other medicines, foods, dyes, or preservatives  pregnant or trying to get pregnant  breast-feeding How should I use this medicine? Take this medicine by mouth with a glass of water. Follow the directions on the prescription label. Take your doses at regular intervals. Do not take your medicine more often than directed. Talk to your pediatrician regarding the use of this medicine in children. Special care may be needed. Overdosage: If you think you have taken too much of this medicine contact a poison control center or emergency room at once. NOTE: This medicine is only for you. Do not share this medicine with others. What if I miss a dose? If you miss a dose, take it as soon as you can. If it is almost time for your next dose, take only that dose. Do not take double or extra doses. What may interact with this medicine?  activated charcoal  albuterol  alprazolam  antacids  antiviral medicines for HIV or AIDS like ritonavir and saquinavir  calcium  certain antibiotics like azithromycin, clarithromycin, erythromycin, gentamicin, neomycin, trimethoprim, and tetracycline  certain medicines for blood pressure, heart disease, irregular heart beat  certain medicines for cancer  certain medicines for cholesterol like atorvastatin, cholestyramine, and colestipol  certain  medicines for diabetes, like acarbose, exenatide, miglitol, and metformin  certain medicines for fungal infections like ketoconazole and itraconazole  certain medicines for stomach problems like omeprazole, esomeprazole, lansoprazole, rabeprazole, metoclopramide, and  sucralfate  conivaptan  cyclosporine  diphenoxylate  epinephrine  kaolin; pectin  nefazodone  NSAIDS, medicines for pain and inflammation, like celecoxib, ibuprofen, or naproxen  penicillamine  phenytoin  propantheline  quinine  phenytoin  rifampin  succinylcholine  St. John's Wort  sulfasalazine  teriparatide  thyroid hormones  tolvaptan This list may not describe all possible interactions. Give your health care provider a list of all the medicines, herbs, non-prescription drugs, or dietary supplements you use. Also tell them if you smoke, drink alcohol, or use illegal drugs. Some items may interact with your medicine. What should I watch for while using this medicine? Visit your doctor or health care professional for regular checks on your progress. Do not stop taking this medicine without the advice of your doctor or health care professional, even if you feel better. Do not change the brand you are taking, other brands may affect you differently. Check your heart rate and blood pressure regularly while you are taking this medicine. Ask your doctor or health care professional what your heart rate and blood pressure should be, and when you should contact him or her. Your doctor or health care professional also may schedule regular blood tests and electrocardiograms to check your progress. Watch your diet. Less digoxin may be absorbed from the stomach if you have a diet high in bran fiber. Do not treat yourself for coughs, colds or allergies without asking your doctor or health care professional for advice. Some ingredients can increase possible side effects. What side effects may I notice from receiving this medicine? Side effects that you should report to your doctor or health care professional as soon as possible:  allergic reactions like skin rash, itching or hives, swelling of the face, lips, or tongue  changes in behavior, mood, or mental ability  changes in  vision  confusion  fast, irregular heartbeat  feeling faint or lightheaded, falls  headache  nausea, vomiting  unusual bleeding, bruising  unusually weak or tired Side effects that usually do not require medical attention (report to your doctor or health care professional if they continue or are bothersome):  breast enlargement in men and women  diarrhea This list may not describe all possible side effects. Call your doctor for medical advice about side effects. You may report side effects to FDA at 1-800-FDA-1088. Where should I keep my medicine? Keep out of the reach of children. Store at room temperature between 15 and 30 degrees C (59 and 86 degrees F). Protect from light and moisture. Throw away any unused medicine after the expiration date. NOTE: This sheet is a summary. It may not cover all possible information. If you have questions about this medicine, talk to your doctor, pharmacist, or health care provider.  2020 Elsevier/Gold Standard (2016-01-23 15:40:26)

## 2019-12-26 DIAGNOSIS — E538 Deficiency of other specified B group vitamins: Secondary | ICD-10-CM | POA: Diagnosis not present

## 2019-12-26 DIAGNOSIS — Z6825 Body mass index (BMI) 25.0-25.9, adult: Secondary | ICD-10-CM | POA: Diagnosis not present

## 2019-12-26 DIAGNOSIS — I2510 Atherosclerotic heart disease of native coronary artery without angina pectoris: Secondary | ICD-10-CM | POA: Diagnosis not present

## 2019-12-26 DIAGNOSIS — S51811A Laceration without foreign body of right forearm, initial encounter: Secondary | ICD-10-CM | POA: Diagnosis not present

## 2019-12-26 DIAGNOSIS — G2581 Restless legs syndrome: Secondary | ICD-10-CM | POA: Diagnosis not present

## 2019-12-26 DIAGNOSIS — G603 Idiopathic progressive neuropathy: Secondary | ICD-10-CM | POA: Diagnosis not present

## 2019-12-26 DIAGNOSIS — K449 Diaphragmatic hernia without obstruction or gangrene: Secondary | ICD-10-CM | POA: Diagnosis not present

## 2019-12-26 DIAGNOSIS — E46 Unspecified protein-calorie malnutrition: Secondary | ICD-10-CM | POA: Diagnosis not present

## 2019-12-26 DIAGNOSIS — Z87310 Personal history of (healed) osteoporosis fracture: Secondary | ICD-10-CM | POA: Diagnosis not present

## 2019-12-26 DIAGNOSIS — K219 Gastro-esophageal reflux disease without esophagitis: Secondary | ICD-10-CM | POA: Diagnosis not present

## 2019-12-26 DIAGNOSIS — E785 Hyperlipidemia, unspecified: Secondary | ICD-10-CM | POA: Diagnosis not present

## 2019-12-26 DIAGNOSIS — D649 Anemia, unspecified: Secondary | ICD-10-CM | POA: Diagnosis not present

## 2019-12-26 DIAGNOSIS — Z981 Arthrodesis status: Secondary | ICD-10-CM | POA: Diagnosis not present

## 2019-12-26 DIAGNOSIS — N200 Calculus of kidney: Secondary | ICD-10-CM | POA: Diagnosis not present

## 2019-12-26 DIAGNOSIS — M8008XD Age-related osteoporosis with current pathological fracture, vertebra(e), subsequent encounter for fracture with routine healing: Secondary | ICD-10-CM | POA: Diagnosis not present

## 2019-12-26 DIAGNOSIS — M26629 Arthralgia of temporomandibular joint, unspecified side: Secondary | ICD-10-CM | POA: Diagnosis not present

## 2019-12-26 DIAGNOSIS — M5126 Other intervertebral disc displacement, lumbar region: Secondary | ICD-10-CM | POA: Diagnosis not present

## 2019-12-26 DIAGNOSIS — E663 Overweight: Secondary | ICD-10-CM | POA: Diagnosis not present

## 2019-12-26 DIAGNOSIS — N400 Enlarged prostate without lower urinary tract symptoms: Secondary | ICD-10-CM | POA: Diagnosis not present

## 2019-12-26 DIAGNOSIS — J449 Chronic obstructive pulmonary disease, unspecified: Secondary | ICD-10-CM | POA: Diagnosis not present

## 2019-12-26 DIAGNOSIS — F0390 Unspecified dementia without behavioral disturbance: Secondary | ICD-10-CM | POA: Diagnosis not present

## 2019-12-26 DIAGNOSIS — I10 Essential (primary) hypertension: Secondary | ICD-10-CM | POA: Diagnosis not present

## 2019-12-26 DIAGNOSIS — J329 Chronic sinusitis, unspecified: Secondary | ICD-10-CM | POA: Diagnosis not present

## 2019-12-26 DIAGNOSIS — F32A Depression, unspecified: Secondary | ICD-10-CM | POA: Diagnosis not present

## 2019-12-26 DIAGNOSIS — F17220 Nicotine dependence, chewing tobacco, uncomplicated: Secondary | ICD-10-CM | POA: Diagnosis not present

## 2019-12-26 DIAGNOSIS — I472 Ventricular tachycardia: Secondary | ICD-10-CM | POA: Diagnosis not present

## 2020-01-03 DIAGNOSIS — M79674 Pain in right toe(s): Secondary | ICD-10-CM | POA: Diagnosis not present

## 2020-01-03 DIAGNOSIS — Z6825 Body mass index (BMI) 25.0-25.9, adult: Secondary | ICD-10-CM | POA: Diagnosis not present

## 2020-01-03 DIAGNOSIS — R600 Localized edema: Secondary | ICD-10-CM | POA: Diagnosis not present

## 2020-01-03 DIAGNOSIS — F0390 Unspecified dementia without behavioral disturbance: Secondary | ICD-10-CM | POA: Diagnosis not present

## 2020-01-03 DIAGNOSIS — I471 Supraventricular tachycardia: Secondary | ICD-10-CM | POA: Diagnosis not present

## 2020-01-03 DIAGNOSIS — I2510 Atherosclerotic heart disease of native coronary artery without angina pectoris: Secondary | ICD-10-CM | POA: Diagnosis not present

## 2020-01-03 DIAGNOSIS — B351 Tinea unguium: Secondary | ICD-10-CM | POA: Diagnosis not present

## 2020-01-05 ENCOUNTER — Other Ambulatory Visit: Payer: Self-pay

## 2020-01-05 ENCOUNTER — Ambulatory Visit: Payer: PPO | Admitting: Cardiology

## 2020-01-06 ENCOUNTER — Other Ambulatory Visit: Payer: Self-pay

## 2020-01-06 ENCOUNTER — Ambulatory Visit: Payer: PPO | Admitting: Cardiology

## 2020-01-06 ENCOUNTER — Encounter: Payer: Self-pay | Admitting: Cardiology

## 2020-01-06 VITALS — BP 116/70 | HR 66 | Ht 62.0 in | Wt 156.4 lb

## 2020-01-06 DIAGNOSIS — Z8679 Personal history of other diseases of the circulatory system: Secondary | ICD-10-CM

## 2020-01-06 DIAGNOSIS — Z9889 Other specified postprocedural states: Secondary | ICD-10-CM | POA: Diagnosis not present

## 2020-01-06 DIAGNOSIS — I2510 Atherosclerotic heart disease of native coronary artery without angina pectoris: Secondary | ICD-10-CM | POA: Diagnosis not present

## 2020-01-06 DIAGNOSIS — E785 Hyperlipidemia, unspecified: Secondary | ICD-10-CM | POA: Diagnosis not present

## 2020-01-06 NOTE — Progress Notes (Signed)
Cardiology Office Note:    Date:  01/06/2020   ID:  Chris Braun, DOB Nov 28, 1935, MRN [id number]  PCP:  Penelope Coop, FNP  Cardiologist:  Jenean Lindau, MD   Referring MD: Penelope Coop, FNP    ASSESSMENT:    1. Coronary artery disease involving native coronary artery of native heart without angina pectoris   2. Status post ablation of ventricular arrhythmia   3. Dyslipidemia    PLAN:    In order of problems listed above:  1. Secondary prevention stressed with the patient.  Importance of compliance with diet medication stressed any vocalized understanding. 2. Sinus tachycardia: Has resolved now.  Patient on digoxin on other medications such as verapamil. 3. He goes to see his primary care physician he will get a digoxin level.  On that day I told him not to take a digoxin tablet and take it after he comes back home if he is due for digoxin on that day. 4. Coronary artery disease: Stable medical management at this time. 5. Mixed dyslipidemia: Diet was emphasized.  Lipids reviewed. 6. Patient will be seen in follow-up appointment in 6 weeks or earlier if the patient has any concerns    Medication Adjustments/Labs and Tests Ordered: Current medicines are reviewed at length with the patient today.  Concerns regarding medicines are outlined above.  No orders of the defined types were placed in this encounter.  No orders of the defined types were placed in this encounter.    No chief complaint on file.    History of Present Illness:    Chris Braun is a 84 y.o. male.  Patient has past medical history of coronary artery disease essential hypertension, dyslipidemia and sinus tachycardia.  I initiated him on digoxin and he and his wife mentioned to me that they feel remarkably better and looks much better.  He looks significantly better today.  He denies any chest pain orthopnea or PND.  At the time of my evaluation, the patient is alert awake oriented and in no  distress.  Past Medical History:  Diagnosis Date  . Acute hypoxemic respiratory failure ([place]) 05/27/2018  . AKI (acute kidney injury) ([place]) 05/27/2018  . Asthma   . Atrial tachycardia ([place]) 07/26/2018  . B12 deficiency   . CAD (coronary artery disease) 08/24/2014  . Carotid stenosis, bilateral 08/24/2014  . Cataracts, bilateral   . Compression fracture    BAck  . Compression fracture of L2 lumbar vertebra, closed, initial encounter ([place]) 10/27/2019  . COPD (chronic obstructive pulmonary disease) ([place])   . Depression   . Diverticulosis    per colonscopy  . Dyslipidemia 08/24/2014  . Falls frequently 06/03/2015  . Gait abnormality 01/19/2018  . GERD (gastroesophageal reflux disease)   . Hiatal hernia   . High cholesterol   . Hypertension   . Hypoxia   . Idiopathic progressive polyneuropathy   . Kidney stones   . Lumbar vertebral fracture, pathologic 10/27/2019  . Macular degeneration   . Memory disorder 01/19/2018  . Myocardial infarction ([place]) 2002  . 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 Osteoporosis   . Right kidney stone 05/27/2018  . Right upper lobe pneumonia 05/27/2018  . Risk for falls 05/28/2015  . Status post ablation of ventricular arrhythmia 04/27/2018  . 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 Stroke ([place])   . SVT (supraventricular tachycardia) ([place]) 12/09/2019  . Weakness of distal arms and legs 06/03/2015    Past Surgical History:  Procedure  Laterality Date  . CARDIAC CATHETERIZATION  2002,   stents   . CATARACT EXTRACTION Left 2016  . Colonscopy  2012  . CYSTOSCOPY W/ URETERAL STENT PLACEMENT Right 05/30/2018   Procedure: CYSTOSCOPY WITH RETROGRADE PYELOGRAM/URETERAL STENT PLACEMENT;  Surgeon: Irine Seal, MD;  Location: [facility];  Service: Urology;  Laterality: Right;  . HIP FRACTURE SURGERY  2008   right  . HYDROCELE EXCISION  10/12/2014  . LITHOTRIPSY     2007, 2009, 2010, 2011, 2012, 2013  . LUMBAR LAMINECTOMY/DECOMPRESSION MICRODISCECTOMY  03/15/2012   Procedure:  LUMBAR LAMINECTOMY/DECOMPRESSION MICRODISCECTOMY 1 LEVEL;  Surgeon: Elaina Hoops, MD;  Location: [facility];  Service: Neurosurgery;  Laterality: Left;  Left lumbar three-four decompressive lumbar laminectomy, discectomy  . MAXIMUM ACCESS (MAS)POSTERIOR LUMBAR INTERBODY FUSION (PLIF) 2 LEVEL N/A 12/08/2012   Procedure: FOR MAXIMUM ACCESS (MAS) POSTERIOR LUMBAR INTERBODY FUSION (PLIF) 2 LEVEL;  Surgeon: Elaina Hoops, MD;  Location: [facility];  Service: Neurosurgery;  Laterality: N/A;  FOR MAXIMUM ACCESS (MAS) POSTERIOR LUMBAR INTERBODY FUSION (PLIF) 2 LEVEL  . ROTATOR CUFF REPAIR  1999   bil   . [place]  . TOTAL HIP ARTHROPLASTY Right 2008  . VT study     with Ablation    Current Medications: Current Meds  Medication Sig  . Acetaminophen 325 MG CAPS Take 975 mg by mouth every 8 (eight) hours as needed (pain).   Marland Kitchen albuterol (PROVENTIL HFA;VENTOLIN HFA) 108 (90 BASE) MCG/ACT inhaler Inhale 2 puffs into the lungs every 6 (six) hours as needed for wheezing.  Marland Kitchen aspirin EC 81 MG tablet Take 81 mg by mouth at bedtime.   Marland Kitchen atorvastatin (LIPITOR) 80 MG tablet Take 80 mg by mouth at bedtime.   . benzonatate (TESSALON) 200 MG capsule Take 200 mg by mouth in the morning, at noon, and at bedtime.   . calcitonin, salmon, (MIACALCIN/FORTICAL) 200 UNIT/ACT nasal spray Place 1 spray into alternate nostrils at bedtime.   . calcium-vitamin D (OSCAL WITH D) 500-200 MG-UNIT tablet Take 1 tablet by mouth 2 (two) times daily.  . Cholecalciferol (VITAMIN D3) 1.25 MG (50000 UT) TABS Take 5,000 Units by mouth at bedtime.   . clopidogrel (PLAVIX) 75 MG tablet Take 75 mg by mouth daily.  . digoxin (LANOXIN) 0.125 MG tablet Take 1 tablet (0.125 mg total) by mouth every other day.  . donepezil (ARICEPT) 5 MG tablet Take 5 mg by mouth at bedtime.   . ferrous sulfate 325 (65 FE) MG tablet Take 325 mg by mouth every evening.   Marland Kitchen guaiFENesin (MUCINEX) 600 MG 12 hr tablet Take 1,200 mg by mouth  2 (two) times daily.   Marland Kitchen HYDROcodone-acetaminophen (NORCO/VICODIN) 5-325 MG tablet Take 1 tablet by mouth every 6 (six) hours as needed for moderate pain.  Marland Kitchen ipratropium-albuterol (DUONEB) 0.5-2.5 (3) MG/3ML SOLN Take 3 mLs by nebulization 2 (two) times daily.   Marland Kitchen ketoconazole (NIZORAL) 2 % cream Apply 1 application topically daily as needed for irritation (on feet). Apply as needed to affected area.    . lactulose (CHRONULAC) 10 GM/15ML solution Take 10 g by mouth at bedtime as needed for mild constipation.   Marland Kitchen loperamide (IMODIUM) 2 MG capsule Take 2-4 mg by mouth as needed for diarrhea or loose stools.  Marland Kitchen loratadine (CLARITIN) 10 MG tablet Take 10 mg by mouth in the morning.   . methocarbamol (ROBAXIN) 500 MG tablet Take 1 tablet (500 mg total) by mouth 3 (three)  times daily.  . metoprolol succinate (TOPROL-XL) 25 MG 24 hr tablet Take 1 tablet (25 mg total) by mouth daily.  . montelukast (SINGULAIR) 10 MG tablet Take 10 mg by mouth daily.   . Multiple Vitamins-Minerals (MULTIVITAMIN PO) Take 1 tablet by mouth every evening.   . multivitamin-lutein (OCUVITE-LUTEIN) CAPS capsule Take 1 capsule by mouth daily.  . nitroGLYCERIN (NITROSTAT) 0.4 MG SL tablet DISSOLVE 1 TABLET UNDER THE TONGUE EVERY 5 MINUTES AS NEEDED FOR CHEST PAIN. DO NOT EXCEED A TOTAL OF 3 DOSES IN 15 MINUTES. (Patient taking differently: Place 0.4 mg under the tongue every 5 (five) minutes x 3 doses as needed for chest pain. )  . ondansetron (ZOFRAN-ODT) 4 MG disintegrating tablet Take 4 mg by mouth every 8 (eight) hours as needed for nausea or vomiting.   . pantoprazole (PROTONIX) 40 MG tablet Take 40 mg by mouth daily before breakfast.   . polyethylene glycol (MIRALAX / GLYCOLAX) 17 g packet Take 17 g by mouth daily.  . potassium chloride (KLOR-CON) 10 MEQ tablet Take 10 mEq by mouth daily as needed (take with lasix).  Marland Kitchen rOPINIRole (REQUIP) 0.5 MG tablet Take 0.5 mg by mouth in the morning, at noon, and at bedtime.   .  SYMBICORT 160-4.5 MCG/ACT inhaler Inhale 2 puffs into the lungs 2 (two) times daily.  . tamsulosin (FLOMAX) 0.4 MG CAPS capsule Take 0.4 mg by mouth daily after supper.  . traMADol (ULTRAM) 50 MG tablet Take 50 mg by mouth every 6 (six) hours as needed for moderate pain.   . verapamil (CALAN) 40 MG tablet Take one tablet twice daily as needed for heart rate greater than 90.  . vitamin B-12 (CYANOCOBALAMIN) 1000 MCG tablet Take 2,000 mcg by mouth daily.     Allergies:   Other, Pneumovax [pneumococcal polysaccharide vaccine], Ibuprofen, Norco [hydrocodone-acetaminophen], and Oxycodone   Social History   Socioeconomic History  . Marital status: Married    Spouse name: Nyal Schachter  . Number of children: 4  . Years of education: 17  . Highest education level: Not on file  Occupational History  . Occupation: Retired  Tobacco Use  . Smoking status: Former Smoker    Packs/day: 1.00    Years: 45.00    Pack years: 45.00    Types: Cigarettes    Quit date: 02/02/2001    Years since quitting: 18.9  . Smokeless tobacco: Current User    Types: Chew  Vaping Use  . Vaping Use: Never used  Substance and Sexual Activity  . Alcohol use: No  . Drug use: No  . Sexual activity: Not on file  Other Topics Concern  . Not on file  Social History Narrative   Lives with wife   Caffeine use: coffee, tea   Social Determinants of Health   Financial Resource Strain:   . Difficulty of Paying Living Expenses: Not on file  Food Insecurity:   . Worried About Charity fundraiser in the Last Year: Not on file  . Ran Out of Food in the Last Year: Not on file  Transportation Needs:   . Lack of Transportation (Medical): Not on file  . Lack of Transportation (Non-Medical): Not on file  Physical Activity:   . Days of Exercise per Week: Not on file  . Minutes of Exercise per Session: Not on file  Stress:   . Feeling of Stress : Not on file  Social Connections:   . Frequency of Communication with  Friends and Family:  Not on file  . Frequency of Social Gatherings with Friends and Family: Not on file  . Attends Religious Services: Not on file  . Active Member of Clubs or Organizations: Not on file  . Attends Archivist Meetings: Not on file  . Marital Status: Not on file     Family History: The patient's family history includes Alzheimer's disease in his sister; Heart disease in his father, mother, sister, sister, and sister; Hemolytic uremic syndrome in his mother; Stroke in his father.  ROS:   Please see the history of present illness.    All other systems reviewed and are negative.  EKGs/Labs/Other Studies Reviewed:    The following studies were reviewed today: I discussed my findings with the patient at length.   Recent Labs: 12/09/2019: ALT 19; Magnesium 1.8; TSH 1.812 12/10/2019: BUN 12; Creatinine, Ser 1.20; Hemoglobin 13.3; Platelets 212; Potassium 4.1; Sodium 138  Recent Lipid Panel No results found for: CHOL, TRIG, HDL, CHOLHDL, VLDL, LDLCALC, LDLDIRECT  Physical Exam:    VS:  BP 116/70   Pulse 66   Ht 5\' 2"  (1.575 m)   Wt 156 lb 6.4 oz (70.9 kg)   SpO2 94%   BMI 28.61 kg/m     Wt Readings from Last 3 Encounters:  01/06/20 156 lb 6.4 oz (70.9 kg)  12/23/19 156 lb (70.8 kg)  12/10/19 151 lb 10.1 oz (68.8 kg)     GEN: Patient is in no acute distress HEENT: Normal NECK: No JVD; No carotid bruits LYMPHATICS: No lymphadenopathy CARDIAC: Hear sounds regular, 2/6 systolic murmur at the apex. RESPIRATORY:  Clear to auscultation without rales, wheezing or rhonchi  ABDOMEN: Soft, non-tender, non-distended MUSCULOSKELETAL:  No edema; No deformity  SKIN: Warm and dry NEUROLOGIC:  Alert and oriented x 3 PSYCHIATRIC:  Normal affect   Signed, Jenean Lindau, MD  01/06/2020 1:55 PM    [facility]

## 2020-01-06 NOTE — Patient Instructions (Addendum)

## 2020-01-09 DIAGNOSIS — L602 Onychogryphosis: Secondary | ICD-10-CM | POA: Diagnosis not present

## 2020-01-11 DIAGNOSIS — J441 Chronic obstructive pulmonary disease with (acute) exacerbation: Secondary | ICD-10-CM | POA: Diagnosis not present

## 2020-01-11 DIAGNOSIS — R220 Localized swelling, mass and lump, head: Secondary | ICD-10-CM | POA: Diagnosis not present

## 2020-01-11 DIAGNOSIS — G8929 Other chronic pain: Secondary | ICD-10-CM | POA: Diagnosis not present

## 2020-01-11 DIAGNOSIS — K219 Gastro-esophageal reflux disease without esophagitis: Secondary | ICD-10-CM | POA: Diagnosis not present

## 2020-01-11 DIAGNOSIS — Z8673 Personal history of transient ischemic attack (TIA), and cerebral infarction without residual deficits: Secondary | ICD-10-CM | POA: Diagnosis not present

## 2020-01-11 DIAGNOSIS — Z955 Presence of coronary angioplasty implant and graft: Secondary | ICD-10-CM | POA: Diagnosis not present

## 2020-01-11 DIAGNOSIS — K449 Diaphragmatic hernia without obstruction or gangrene: Secondary | ICD-10-CM | POA: Diagnosis not present

## 2020-01-11 DIAGNOSIS — G319 Degenerative disease of nervous system, unspecified: Secondary | ICD-10-CM | POA: Diagnosis not present

## 2020-01-11 DIAGNOSIS — Z7982 Long term (current) use of aspirin: Secondary | ICD-10-CM | POA: Diagnosis not present

## 2020-01-11 DIAGNOSIS — R0902 Hypoxemia: Secondary | ICD-10-CM | POA: Diagnosis not present

## 2020-01-11 DIAGNOSIS — S51011A Laceration without foreign body of right elbow, initial encounter: Secondary | ICD-10-CM | POA: Diagnosis not present

## 2020-01-11 DIAGNOSIS — I10 Essential (primary) hypertension: Secondary | ICD-10-CM | POA: Diagnosis not present

## 2020-01-11 DIAGNOSIS — J439 Emphysema, unspecified: Secondary | ICD-10-CM | POA: Diagnosis not present

## 2020-01-11 DIAGNOSIS — I714 Abdominal aortic aneurysm, without rupture: Secondary | ICD-10-CM | POA: Diagnosis not present

## 2020-01-11 DIAGNOSIS — S22060A Wedge compression fracture of T7-T8 vertebra, initial encounter for closed fracture: Secondary | ICD-10-CM | POA: Diagnosis not present

## 2020-01-11 DIAGNOSIS — F419 Anxiety disorder, unspecified: Secondary | ICD-10-CM | POA: Diagnosis not present

## 2020-01-11 DIAGNOSIS — M4802 Spinal stenosis, cervical region: Secondary | ICD-10-CM | POA: Diagnosis not present

## 2020-01-11 DIAGNOSIS — Z87891 Personal history of nicotine dependence: Secondary | ICD-10-CM | POA: Diagnosis not present

## 2020-01-11 DIAGNOSIS — M47812 Spondylosis without myelopathy or radiculopathy, cervical region: Secondary | ICD-10-CM | POA: Diagnosis not present

## 2020-01-11 DIAGNOSIS — Z23 Encounter for immunization: Secondary | ICD-10-CM | POA: Diagnosis not present

## 2020-01-11 DIAGNOSIS — F32A Depression, unspecified: Secondary | ICD-10-CM | POA: Diagnosis not present

## 2020-01-11 DIAGNOSIS — I252 Old myocardial infarction: Secondary | ICD-10-CM | POA: Diagnosis not present

## 2020-01-11 DIAGNOSIS — I6529 Occlusion and stenosis of unspecified carotid artery: Secondary | ICD-10-CM | POA: Diagnosis not present

## 2020-01-11 DIAGNOSIS — I2510 Atherosclerotic heart disease of native coronary artery without angina pectoris: Secondary | ICD-10-CM | POA: Diagnosis not present

## 2020-01-11 DIAGNOSIS — J849 Interstitial pulmonary disease, unspecified: Secondary | ICD-10-CM | POA: Diagnosis not present

## 2020-01-11 DIAGNOSIS — J9621 Acute and chronic respiratory failure with hypoxia: Secondary | ICD-10-CM | POA: Diagnosis not present

## 2020-01-11 DIAGNOSIS — J449 Chronic obstructive pulmonary disease, unspecified: Secondary | ICD-10-CM | POA: Diagnosis not present

## 2020-01-11 DIAGNOSIS — M549 Dorsalgia, unspecified: Secondary | ICD-10-CM | POA: Diagnosis not present

## 2020-01-11 DIAGNOSIS — S3991XA Unspecified injury of abdomen, initial encounter: Secondary | ICD-10-CM | POA: Diagnosis not present

## 2020-01-11 DIAGNOSIS — R55 Syncope and collapse: Secondary | ICD-10-CM | POA: Diagnosis not present

## 2020-01-11 DIAGNOSIS — E785 Hyperlipidemia, unspecified: Secondary | ICD-10-CM | POA: Diagnosis not present

## 2020-01-11 DIAGNOSIS — Z79899 Other long term (current) drug therapy: Secondary | ICD-10-CM | POA: Diagnosis not present

## 2020-01-12 DIAGNOSIS — J441 Chronic obstructive pulmonary disease with (acute) exacerbation: Secondary | ICD-10-CM | POA: Diagnosis not present

## 2020-01-12 DIAGNOSIS — F419 Anxiety disorder, unspecified: Secondary | ICD-10-CM | POA: Diagnosis not present

## 2020-01-12 DIAGNOSIS — I2510 Atherosclerotic heart disease of native coronary artery without angina pectoris: Secondary | ICD-10-CM | POA: Diagnosis not present

## 2020-01-13 DIAGNOSIS — J441 Chronic obstructive pulmonary disease with (acute) exacerbation: Secondary | ICD-10-CM | POA: Diagnosis not present

## 2020-01-13 DIAGNOSIS — I2510 Atherosclerotic heart disease of native coronary artery without angina pectoris: Secondary | ICD-10-CM | POA: Diagnosis not present

## 2020-01-13 DIAGNOSIS — F419 Anxiety disorder, unspecified: Secondary | ICD-10-CM | POA: Diagnosis not present

## 2020-01-15 DIAGNOSIS — S41111A Laceration without foreign body of right upper arm, initial encounter: Secondary | ICD-10-CM | POA: Diagnosis not present

## 2020-01-15 DIAGNOSIS — S301XXA Contusion of abdominal wall, initial encounter: Secondary | ICD-10-CM | POA: Diagnosis not present

## 2020-01-15 DIAGNOSIS — S61511A Laceration without foreign body of right wrist, initial encounter: Secondary | ICD-10-CM | POA: Diagnosis not present

## 2020-01-17 DIAGNOSIS — E785 Hyperlipidemia, unspecified: Secondary | ICD-10-CM | POA: Diagnosis not present

## 2020-01-17 DIAGNOSIS — G2581 Restless legs syndrome: Secondary | ICD-10-CM | POA: Diagnosis not present

## 2020-01-17 DIAGNOSIS — J449 Chronic obstructive pulmonary disease, unspecified: Secondary | ICD-10-CM | POA: Diagnosis not present

## 2020-01-17 DIAGNOSIS — Z79899 Other long term (current) drug therapy: Secondary | ICD-10-CM | POA: Diagnosis not present

## 2020-01-17 DIAGNOSIS — E538 Deficiency of other specified B group vitamins: Secondary | ICD-10-CM | POA: Diagnosis not present

## 2020-01-17 DIAGNOSIS — S41111A Laceration without foreign body of right upper arm, initial encounter: Secondary | ICD-10-CM | POA: Diagnosis not present

## 2020-01-17 DIAGNOSIS — I10 Essential (primary) hypertension: Secondary | ICD-10-CM | POA: Diagnosis not present

## 2020-01-17 DIAGNOSIS — I471 Supraventricular tachycardia: Secondary | ICD-10-CM | POA: Diagnosis not present

## 2020-01-17 DIAGNOSIS — F0390 Unspecified dementia without behavioral disturbance: Secondary | ICD-10-CM | POA: Diagnosis not present

## 2020-01-17 DIAGNOSIS — I2510 Atherosclerotic heart disease of native coronary artery without angina pectoris: Secondary | ICD-10-CM | POA: Diagnosis not present

## 2020-01-17 DIAGNOSIS — D649 Anemia, unspecified: Secondary | ICD-10-CM | POA: Diagnosis not present

## 2020-01-23 DIAGNOSIS — J4530 Mild persistent asthma, uncomplicated: Secondary | ICD-10-CM | POA: Diagnosis not present

## 2020-01-23 DIAGNOSIS — R918 Other nonspecific abnormal finding of lung field: Secondary | ICD-10-CM | POA: Diagnosis not present

## 2020-01-23 DIAGNOSIS — J301 Allergic rhinitis due to pollen: Secondary | ICD-10-CM | POA: Diagnosis not present

## 2020-01-25 DIAGNOSIS — E785 Hyperlipidemia, unspecified: Secondary | ICD-10-CM | POA: Diagnosis not present

## 2020-01-25 DIAGNOSIS — G2581 Restless legs syndrome: Secondary | ICD-10-CM | POA: Diagnosis not present

## 2020-01-25 DIAGNOSIS — J449 Chronic obstructive pulmonary disease, unspecified: Secondary | ICD-10-CM | POA: Diagnosis not present

## 2020-01-25 DIAGNOSIS — G603 Idiopathic progressive neuropathy: Secondary | ICD-10-CM | POA: Diagnosis not present

## 2020-01-25 DIAGNOSIS — S51811A Laceration without foreign body of right forearm, initial encounter: Secondary | ICD-10-CM | POA: Diagnosis not present

## 2020-01-25 DIAGNOSIS — I472 Ventricular tachycardia: Secondary | ICD-10-CM | POA: Diagnosis not present

## 2020-01-25 DIAGNOSIS — F0390 Unspecified dementia without behavioral disturbance: Secondary | ICD-10-CM | POA: Diagnosis not present

## 2020-01-25 DIAGNOSIS — R0902 Hypoxemia: Secondary | ICD-10-CM | POA: Diagnosis not present

## 2020-01-25 DIAGNOSIS — I2510 Atherosclerotic heart disease of native coronary artery without angina pectoris: Secondary | ICD-10-CM | POA: Diagnosis not present

## 2020-01-25 DIAGNOSIS — F32A Depression, unspecified: Secondary | ICD-10-CM | POA: Diagnosis not present

## 2020-01-25 DIAGNOSIS — N400 Enlarged prostate without lower urinary tract symptoms: Secondary | ICD-10-CM | POA: Diagnosis not present

## 2020-01-25 DIAGNOSIS — F17220 Nicotine dependence, chewing tobacco, uncomplicated: Secondary | ICD-10-CM | POA: Diagnosis not present

## 2020-01-25 DIAGNOSIS — J441 Chronic obstructive pulmonary disease with (acute) exacerbation: Secondary | ICD-10-CM | POA: Diagnosis not present

## 2020-01-25 DIAGNOSIS — M26629 Arthralgia of temporomandibular joint, unspecified side: Secondary | ICD-10-CM | POA: Diagnosis not present

## 2020-01-25 DIAGNOSIS — E663 Overweight: Secondary | ICD-10-CM | POA: Diagnosis not present

## 2020-01-25 DIAGNOSIS — K449 Diaphragmatic hernia without obstruction or gangrene: Secondary | ICD-10-CM | POA: Diagnosis not present

## 2020-01-25 DIAGNOSIS — D649 Anemia, unspecified: Secondary | ICD-10-CM | POA: Diagnosis not present

## 2020-01-25 DIAGNOSIS — M5126 Other intervertebral disc displacement, lumbar region: Secondary | ICD-10-CM | POA: Diagnosis not present

## 2020-01-25 DIAGNOSIS — I10 Essential (primary) hypertension: Secondary | ICD-10-CM | POA: Diagnosis not present

## 2020-01-25 DIAGNOSIS — M8008XD Age-related osteoporosis with current pathological fracture, vertebra(e), subsequent encounter for fracture with routine healing: Secondary | ICD-10-CM | POA: Diagnosis not present

## 2020-01-25 DIAGNOSIS — E538 Deficiency of other specified B group vitamins: Secondary | ICD-10-CM | POA: Diagnosis not present

## 2020-01-25 DIAGNOSIS — E46 Unspecified protein-calorie malnutrition: Secondary | ICD-10-CM | POA: Diagnosis not present

## 2020-01-25 DIAGNOSIS — N200 Calculus of kidney: Secondary | ICD-10-CM | POA: Diagnosis not present

## 2020-01-25 DIAGNOSIS — K219 Gastro-esophageal reflux disease without esophagitis: Secondary | ICD-10-CM | POA: Diagnosis not present

## 2020-01-25 DIAGNOSIS — J329 Chronic sinusitis, unspecified: Secondary | ICD-10-CM | POA: Diagnosis not present

## 2020-02-02 DIAGNOSIS — M47817 Spondylosis without myelopathy or radiculopathy, lumbosacral region: Secondary | ICD-10-CM | POA: Diagnosis not present

## 2020-02-02 DIAGNOSIS — M5126 Other intervertebral disc displacement, lumbar region: Secondary | ICD-10-CM | POA: Diagnosis not present

## 2020-02-09 ENCOUNTER — Other Ambulatory Visit: Payer: Self-pay

## 2020-02-09 ENCOUNTER — Emergency Department (HOSPITAL_COMMUNITY)
Admission: EM | Admit: 2020-02-09 | Discharge: 2020-02-10 | Disposition: A | Discharge status: Home / Self Care | Payer: PPO | Source: Home / Self Care

## 2020-02-09 DIAGNOSIS — Z20822 Contact with and (suspected) exposure to covid-19: Secondary | ICD-10-CM | POA: Diagnosis not present

## 2020-02-09 DIAGNOSIS — R059 Cough, unspecified: Secondary | ICD-10-CM | POA: Diagnosis not present

## 2020-02-09 DIAGNOSIS — I480 Paroxysmal atrial fibrillation: Secondary | ICD-10-CM | POA: Diagnosis present

## 2020-02-09 DIAGNOSIS — M8448XA Pathological fracture, other site, initial encounter for fracture: Secondary | ICD-10-CM | POA: Diagnosis not present

## 2020-02-09 DIAGNOSIS — Z5321 Procedure and treatment not carried out due to patient leaving prior to being seen by health care provider: Secondary | ICD-10-CM | POA: Diagnosis not present

## 2020-02-09 DIAGNOSIS — R000 Tachycardia, unspecified: Secondary | ICD-10-CM | POA: Diagnosis not present

## 2020-02-09 DIAGNOSIS — K222 Esophageal obstruction: Secondary | ICD-10-CM | POA: Diagnosis not present

## 2020-02-09 DIAGNOSIS — I252 Old myocardial infarction: Secondary | ICD-10-CM | POA: Diagnosis not present

## 2020-02-09 DIAGNOSIS — E7800 Pure hypercholesterolemia, unspecified: Secondary | ICD-10-CM | POA: Diagnosis not present

## 2020-02-09 DIAGNOSIS — Z96641 Presence of right artificial hip joint: Secondary | ICD-10-CM | POA: Diagnosis present

## 2020-02-09 DIAGNOSIS — E785 Hyperlipidemia, unspecified: Secondary | ICD-10-CM | POA: Diagnosis not present

## 2020-02-09 DIAGNOSIS — D696 Thrombocytopenia, unspecified: Secondary | ICD-10-CM | POA: Diagnosis not present

## 2020-02-09 DIAGNOSIS — I2510 Atherosclerotic heart disease of native coronary artery without angina pectoris: Secondary | ICD-10-CM | POA: Diagnosis not present

## 2020-02-09 DIAGNOSIS — J9811 Atelectasis: Secondary | ICD-10-CM | POA: Diagnosis not present

## 2020-02-09 DIAGNOSIS — J9601 Acute respiratory failure with hypoxia: Secondary | ICD-10-CM | POA: Diagnosis not present

## 2020-02-09 DIAGNOSIS — H9190 Unspecified hearing loss, unspecified ear: Secondary | ICD-10-CM | POA: Diagnosis not present

## 2020-02-09 DIAGNOSIS — J449 Chronic obstructive pulmonary disease, unspecified: Secondary | ICD-10-CM | POA: Diagnosis present

## 2020-02-09 DIAGNOSIS — Z7401 Bed confinement status: Secondary | ICD-10-CM | POA: Diagnosis not present

## 2020-02-09 DIAGNOSIS — G2581 Restless legs syndrome: Secondary | ICD-10-CM | POA: Diagnosis not present

## 2020-02-09 DIAGNOSIS — Z8679 Personal history of other diseases of the circulatory system: Secondary | ICD-10-CM | POA: Diagnosis not present

## 2020-02-09 DIAGNOSIS — I471 Supraventricular tachycardia: Secondary | ICD-10-CM | POA: Diagnosis not present

## 2020-02-09 DIAGNOSIS — I700 Atherosclerosis of aorta: Secondary | ICD-10-CM | POA: Diagnosis not present

## 2020-02-09 DIAGNOSIS — Z87442 Personal history of urinary calculi: Secondary | ICD-10-CM | POA: Diagnosis not present

## 2020-02-09 DIAGNOSIS — Z79899 Other long term (current) drug therapy: Secondary | ICD-10-CM | POA: Diagnosis not present

## 2020-02-09 DIAGNOSIS — T18128A Food in esophagus causing other injury, initial encounter: Secondary | ICD-10-CM | POA: Diagnosis present

## 2020-02-09 DIAGNOSIS — Z955 Presence of coronary angioplasty implant and graft: Secondary | ICD-10-CM | POA: Diagnosis not present

## 2020-02-09 DIAGNOSIS — F0390 Unspecified dementia without behavioral disturbance: Secondary | ICD-10-CM | POA: Diagnosis not present

## 2020-02-09 DIAGNOSIS — Z8673 Personal history of transient ischemic attack (TIA), and cerebral infarction without residual deficits: Secondary | ICD-10-CM | POA: Diagnosis not present

## 2020-02-09 DIAGNOSIS — X58XXXA Exposure to other specified factors, initial encounter: Secondary | ICD-10-CM | POA: Insufficient documentation

## 2020-02-09 DIAGNOSIS — K56699 Other intestinal obstruction unspecified as to partial versus complete obstruction: Secondary | ICD-10-CM | POA: Diagnosis not present

## 2020-02-09 DIAGNOSIS — I10 Essential (primary) hypertension: Secondary | ICD-10-CM | POA: Diagnosis present

## 2020-02-09 DIAGNOSIS — Q399 Congenital malformation of esophagus, unspecified: Secondary | ICD-10-CM | POA: Diagnosis not present

## 2020-02-09 DIAGNOSIS — K5669 Other partial intestinal obstruction: Secondary | ICD-10-CM | POA: Diagnosis not present

## 2020-02-09 DIAGNOSIS — M810 Age-related osteoporosis without current pathological fracture: Secondary | ICD-10-CM | POA: Diagnosis present

## 2020-02-09 DIAGNOSIS — R0902 Hypoxemia: Secondary | ICD-10-CM | POA: Diagnosis not present

## 2020-02-09 DIAGNOSIS — G459 Transient cerebral ischemic attack, unspecified: Secondary | ICD-10-CM | POA: Diagnosis not present

## 2020-02-09 DIAGNOSIS — Z9889 Other specified postprocedural states: Secondary | ICD-10-CM | POA: Diagnosis not present

## 2020-02-09 DIAGNOSIS — M2550 Pain in unspecified joint: Secondary | ICD-10-CM | POA: Diagnosis not present

## 2020-02-09 DIAGNOSIS — R1310 Dysphagia, unspecified: Secondary | ICD-10-CM | POA: Diagnosis not present

## 2020-02-09 DIAGNOSIS — R413 Other amnesia: Secondary | ICD-10-CM | POA: Diagnosis not present

## 2020-02-09 DIAGNOSIS — J690 Pneumonitis due to inhalation of food and vomit: Secondary | ICD-10-CM | POA: Diagnosis not present

## 2020-02-09 DIAGNOSIS — N400 Enlarged prostate without lower urinary tract symptoms: Secondary | ICD-10-CM | POA: Diagnosis present

## 2020-02-09 DIAGNOSIS — K449 Diaphragmatic hernia without obstruction or gangrene: Secondary | ICD-10-CM | POA: Diagnosis present

## 2020-02-09 DIAGNOSIS — T17908A Unspecified foreign body in respiratory tract, part unspecified causing other injury, initial encounter: Secondary | ICD-10-CM | POA: Diagnosis not present

## 2020-02-09 NOTE — ED Triage Notes (Signed)
Pt presents to ED POV. Pt c/o food stuck in throat. Pt reports that he was eating at 1600. Pt cannot swallow any water. Airway intact.

## 2020-02-10 ENCOUNTER — Emergency Department (HOSPITAL_COMMUNITY): Payer: PPO | Admitting: Certified Registered Nurse Anesthetist

## 2020-02-10 ENCOUNTER — Emergency Department (HOSPITAL_COMMUNITY): Payer: PPO

## 2020-02-10 ENCOUNTER — Encounter (HOSPITAL_COMMUNITY)
Admission: EM | Disposition: A | Discharge status: Home Health | Payer: Self-pay | Source: Home / Self Care | Attending: Internal Medicine

## 2020-02-10 ENCOUNTER — Observation Stay (HOSPITAL_COMMUNITY): Payer: PPO

## 2020-02-10 ENCOUNTER — Encounter (HOSPITAL_COMMUNITY): Payer: Self-pay | Admitting: Emergency Medicine

## 2020-02-10 ENCOUNTER — Inpatient Hospital Stay (HOSPITAL_COMMUNITY)
Admission: EM | Admit: 2020-02-10 | Discharge: 2020-02-16 | DRG: 177 | Disposition: A | Discharge status: Home Health | Payer: PPO | Attending: Internal Medicine | Admitting: Internal Medicine

## 2020-02-10 ENCOUNTER — Other Ambulatory Visit: Payer: Self-pay

## 2020-02-10 DIAGNOSIS — J9601 Acute respiratory failure with hypoxia: Secondary | ICD-10-CM | POA: Diagnosis present

## 2020-02-10 DIAGNOSIS — R000 Tachycardia, unspecified: Secondary | ICD-10-CM | POA: Diagnosis not present

## 2020-02-10 DIAGNOSIS — Q399 Congenital malformation of esophagus, unspecified: Secondary | ICD-10-CM | POA: Diagnosis not present

## 2020-02-10 DIAGNOSIS — I480 Paroxysmal atrial fibrillation: Secondary | ICD-10-CM | POA: Diagnosis present

## 2020-02-10 DIAGNOSIS — Z87891 Personal history of nicotine dependence: Secondary | ICD-10-CM

## 2020-02-10 DIAGNOSIS — Z981 Arthrodesis status: Secondary | ICD-10-CM

## 2020-02-10 DIAGNOSIS — Z87442 Personal history of urinary calculi: Secondary | ICD-10-CM

## 2020-02-10 DIAGNOSIS — Z20822 Contact with and (suspected) exposure to covid-19: Secondary | ICD-10-CM | POA: Diagnosis not present

## 2020-02-10 DIAGNOSIS — R0902 Hypoxemia: Secondary | ICD-10-CM | POA: Diagnosis not present

## 2020-02-10 DIAGNOSIS — Z7951 Long term (current) use of inhaled steroids: Secondary | ICD-10-CM

## 2020-02-10 DIAGNOSIS — Z8673 Personal history of transient ischemic attack (TIA), and cerebral infarction without residual deficits: Secondary | ICD-10-CM

## 2020-02-10 DIAGNOSIS — J690 Pneumonitis due to inhalation of food and vomit: Principal | ICD-10-CM | POA: Diagnosis present

## 2020-02-10 DIAGNOSIS — H9190 Unspecified hearing loss, unspecified ear: Secondary | ICD-10-CM | POA: Diagnosis present

## 2020-02-10 DIAGNOSIS — I700 Atherosclerosis of aorta: Secondary | ICD-10-CM | POA: Diagnosis not present

## 2020-02-10 DIAGNOSIS — N400 Enlarged prostate without lower urinary tract symptoms: Secondary | ICD-10-CM | POA: Diagnosis present

## 2020-02-10 DIAGNOSIS — W44F3XA Food entering into or through a natural orifice, initial encounter: Secondary | ICD-10-CM | POA: Diagnosis present

## 2020-02-10 DIAGNOSIS — I252 Old myocardial infarction: Secondary | ICD-10-CM

## 2020-02-10 DIAGNOSIS — F0390 Unspecified dementia without behavioral disturbance: Secondary | ICD-10-CM | POA: Diagnosis not present

## 2020-02-10 DIAGNOSIS — Z823 Family history of stroke: Secondary | ICD-10-CM

## 2020-02-10 DIAGNOSIS — Z79899 Other long term (current) drug therapy: Secondary | ICD-10-CM

## 2020-02-10 DIAGNOSIS — Z887 Allergy status to serum and vaccine status: Secondary | ICD-10-CM

## 2020-02-10 DIAGNOSIS — E785 Hyperlipidemia, unspecified: Secondary | ICD-10-CM | POA: Diagnosis not present

## 2020-02-10 DIAGNOSIS — Z8249 Family history of ischemic heart disease and other diseases of the circulatory system: Secondary | ICD-10-CM

## 2020-02-10 DIAGNOSIS — K222 Esophageal obstruction: Secondary | ICD-10-CM | POA: Diagnosis not present

## 2020-02-10 DIAGNOSIS — T18128A Food in esophagus causing other injury, initial encounter: Secondary | ICD-10-CM | POA: Diagnosis present

## 2020-02-10 DIAGNOSIS — I2510 Atherosclerotic heart disease of native coronary artery without angina pectoris: Secondary | ICD-10-CM | POA: Diagnosis not present

## 2020-02-10 DIAGNOSIS — Z7902 Long term (current) use of antithrombotics/antiplatelets: Secondary | ICD-10-CM

## 2020-02-10 DIAGNOSIS — M810 Age-related osteoporosis without current pathological fracture: Secondary | ICD-10-CM | POA: Diagnosis present

## 2020-02-10 DIAGNOSIS — R413 Other amnesia: Secondary | ICD-10-CM | POA: Diagnosis present

## 2020-02-10 DIAGNOSIS — R059 Cough, unspecified: Secondary | ICD-10-CM

## 2020-02-10 DIAGNOSIS — T17908A Unspecified foreign body in respiratory tract, part unspecified causing other injury, initial encounter: Secondary | ICD-10-CM

## 2020-02-10 DIAGNOSIS — I471 Supraventricular tachycardia: Secondary | ICD-10-CM | POA: Diagnosis present

## 2020-02-10 DIAGNOSIS — D696 Thrombocytopenia, unspecified: Secondary | ICD-10-CM | POA: Diagnosis present

## 2020-02-10 DIAGNOSIS — J449 Chronic obstructive pulmonary disease, unspecified: Secondary | ICD-10-CM | POA: Diagnosis present

## 2020-02-10 DIAGNOSIS — K449 Diaphragmatic hernia without obstruction or gangrene: Secondary | ICD-10-CM | POA: Diagnosis present

## 2020-02-10 DIAGNOSIS — Z96641 Presence of right artificial hip joint: Secondary | ICD-10-CM | POA: Diagnosis present

## 2020-02-10 DIAGNOSIS — Z7982 Long term (current) use of aspirin: Secondary | ICD-10-CM

## 2020-02-10 DIAGNOSIS — Z9889 Other specified postprocedural states: Secondary | ICD-10-CM

## 2020-02-10 DIAGNOSIS — I10 Essential (primary) hypertension: Secondary | ICD-10-CM | POA: Diagnosis present

## 2020-02-10 DIAGNOSIS — E7800 Pure hypercholesterolemia, unspecified: Secondary | ICD-10-CM | POA: Diagnosis not present

## 2020-02-10 DIAGNOSIS — K5669 Other partial intestinal obstruction: Secondary | ICD-10-CM | POA: Diagnosis not present

## 2020-02-10 DIAGNOSIS — J9811 Atelectasis: Secondary | ICD-10-CM | POA: Diagnosis not present

## 2020-02-10 DIAGNOSIS — Z955 Presence of coronary angioplasty implant and graft: Secondary | ICD-10-CM

## 2020-02-10 DIAGNOSIS — G2581 Restless legs syndrome: Secondary | ICD-10-CM | POA: Diagnosis not present

## 2020-02-10 DIAGNOSIS — K56699 Other intestinal obstruction unspecified as to partial versus complete obstruction: Secondary | ICD-10-CM

## 2020-02-10 DIAGNOSIS — Z820 Family history of epilepsy and other diseases of the nervous system: Secondary | ICD-10-CM

## 2020-02-10 DIAGNOSIS — R5381 Other malaise: Secondary | ICD-10-CM | POA: Diagnosis present

## 2020-02-10 HISTORY — PX: ESOPHAGOGASTRODUODENOSCOPY (EGD) WITH PROPOFOL: SHX5813

## 2020-02-10 HISTORY — PX: FOREIGN BODY REMOVAL: SHX962

## 2020-02-10 LAB — RESP PANEL BY RT-PCR (FLU A&B, COVID) ARPGX2
Influenza A by PCR: NEGATIVE
Influenza B by PCR: NEGATIVE
SARS Coronavirus 2 by RT PCR: NEGATIVE

## 2020-02-10 LAB — BASIC METABOLIC PANEL
Anion gap: 13 (ref 5–15)
BUN: 14 mg/dL (ref 8–23)
CO2: 27 mmol/L (ref 22–32)
Calcium: 9.2 mg/dL (ref 8.9–10.3)
Chloride: 102 mmol/L (ref 98–111)
Creatinine, Ser: 0.99 mg/dL (ref 0.61–1.24)
GFR, Estimated: >60 mL/min (ref >60)
Glucose, Bld: 95 mg/dL (ref 70–99)
Potassium: 3.7 mmol/L (ref 3.5–5.1)
Sodium: 142 mmol/L (ref 135–145)

## 2020-02-10 LAB — CBC WITH DIFFERENTIAL/PLATELET
Abs Immature Granulocytes: 0.13 10*3/uL — ABNORMAL HIGH (ref 0.00–0.07)
Basophils Absolute: 0.1 10*3/uL (ref 0.0–0.1)
Basophils Relative: 1 %
Eosinophils Absolute: 0.1 10*3/uL (ref 0.0–0.5)
Eosinophils Relative: 1 %
HCT: 44 % (ref 39.0–52.0)
Hemoglobin: 15.2 g/dL (ref 13.0–17.0)
Immature Granulocytes: 1 %
Lymphocytes Relative: 11 %
Lymphs Abs: 1.3 10*3/uL (ref 0.7–4.0)
MCH: 31.8 pg (ref 26.0–34.0)
MCHC: 34.5 g/dL (ref 30.0–36.0)
MCV: 92.1 fL (ref 80.0–100.0)
Monocytes Absolute: 0.9 10*3/uL (ref 0.1–1.0)
Monocytes Relative: 8 %
Neutro Abs: 9.1 10*3/uL — ABNORMAL HIGH (ref 1.7–7.7)
Neutrophils Relative %: 78 %
Platelets: 164 10*3/uL (ref 150–400)
RBC: 4.78 MIL/uL (ref 4.22–5.81)
RDW: 14.2 % (ref 11.5–15.5)
WBC: 11.6 10*3/uL — ABNORMAL HIGH (ref 4.0–10.5)
nRBC: 0 % (ref 0.0–0.2)

## 2020-02-10 SURGERY — ESOPHAGOGASTRODUODENOSCOPY (EGD) WITH PROPOFOL
Anesthesia: General

## 2020-02-10 MED ORDER — CALCITONIN (SALMON) 200 UNIT/ACT NA SOLN
1.0000 | Freq: Every day | NASAL | Status: DC
  Administered 2020-02-10 – 2020-02-15 (×6): 1 via NASAL
  Filled 2020-02-10: qty 3.7

## 2020-02-10 MED ORDER — PROPOFOL 10 MG/ML IV BOLUS
INTRAVENOUS | Status: DC | PRN
  Administered 2020-02-10: 20 mg via INTRAVENOUS
  Administered 2020-02-10: 80 mg via INTRAVENOUS

## 2020-02-10 MED ORDER — PHENYLEPHRINE 40 MCG/ML (10ML) SYRINGE FOR IV PUSH (FOR BLOOD PRESSURE SUPPORT)
PREFILLED_SYRINGE | INTRAVENOUS | Status: DC | PRN
  Administered 2020-02-10: 120 ug via INTRAVENOUS
  Administered 2020-02-10: 80 ug via INTRAVENOUS
  Administered 2020-02-10: 120 ug via INTRAVENOUS
  Administered 2020-02-10: 80 ug via INTRAVENOUS

## 2020-02-10 MED ORDER — GLUCAGON HCL RDNA (DIAGNOSTIC) 1 MG IJ SOLR
1.0000 mg | Freq: Once | INTRAMUSCULAR | Status: AC
  Administered 2020-02-10: 15:00:00 1 mg via INTRAMUSCULAR
  Filled 2020-02-10: qty 1

## 2020-02-10 MED ORDER — SODIUM CHLORIDE 0.9 % IV SOLN: INTRAVENOUS | Status: DC

## 2020-02-10 MED ORDER — IPRATROPIUM-ALBUTEROL 0.5-2.5 (3) MG/3ML IN SOLN
3.0000 mL | Freq: Two times a day (BID) | RESPIRATORY_TRACT | Status: DC
  Administered 2020-02-11 (×2): 3 mL via RESPIRATORY_TRACT
  Filled 2020-02-10 (×2): qty 3

## 2020-02-10 MED ORDER — PANTOPRAZOLE SODIUM 40 MG IV SOLR
40.0000 mg | Freq: Two times a day (BID) | INTRAVENOUS | Status: DC
  Administered 2020-02-10 – 2020-02-16 (×12): 40 mg via INTRAVENOUS
  Filled 2020-02-10 (×12): qty 40

## 2020-02-10 MED ORDER — ONDANSETRON HCL 4 MG/2ML IJ SOLN
INTRAMUSCULAR | Status: DC | PRN
  Administered 2020-02-10: 4 mg via INTRAVENOUS

## 2020-02-10 MED ORDER — METOPROLOL SUCCINATE ER 25 MG PO TB24
25.0000 mg | ORAL_TABLET | Freq: Every day | ORAL | Status: DC
  Administered 2020-02-10 – 2020-02-12 (×3): 25 mg via ORAL
  Filled 2020-02-10 (×3): qty 1

## 2020-02-10 MED ORDER — NITROGLYCERIN 0.4 MG SL SUBL: 0.4000 mg | SUBLINGUAL_TABLET | SUBLINGUAL | Status: DC | PRN

## 2020-02-10 MED ORDER — FENTANYL CITRATE (PF) 250 MCG/5ML IJ SOLN
INTRAMUSCULAR | Status: DC | PRN
  Administered 2020-02-10: 100 ug via INTRAVENOUS

## 2020-02-10 MED ORDER — LIDOCAINE 2% (20 MG/ML) 5 ML SYRINGE
INTRAMUSCULAR | Status: DC | PRN
  Administered 2020-02-10: 100 mg via INTRAVENOUS

## 2020-02-10 MED ORDER — CLOPIDOGREL BISULFATE 75 MG PO TABS
75.0000 mg | ORAL_TABLET | Freq: Every day | ORAL | Status: DC
  Administered 2020-02-11 – 2020-02-16 (×6): 75 mg via ORAL
  Filled 2020-02-10 (×6): qty 1

## 2020-02-10 MED ORDER — DONEPEZIL HCL 5 MG PO TABS
5.0000 mg | ORAL_TABLET | Freq: Every day | ORAL | Status: DC
  Administered 2020-02-11 – 2020-02-15 (×5): 5 mg via ORAL
  Filled 2020-02-10 (×5): qty 1

## 2020-02-10 MED ORDER — ROPINIROLE HCL 0.5 MG PO TABS
0.5000 mg | ORAL_TABLET | Freq: Three times a day (TID) | ORAL | Status: DC
  Administered 2020-02-11 – 2020-02-16 (×16): 0.5 mg via ORAL
  Filled 2020-02-10 (×18): qty 1

## 2020-02-10 MED ORDER — MOMETASONE FURO-FORMOTEROL FUM 200-5 MCG/ACT IN AERO
2.0000 | INHALATION_SPRAY | Freq: Two times a day (BID) | RESPIRATORY_TRACT | Status: DC
  Administered 2020-02-11 – 2020-02-16 (×9): 2 via RESPIRATORY_TRACT
  Filled 2020-02-10 (×2): qty 8.8

## 2020-02-10 MED ORDER — ATORVASTATIN CALCIUM 80 MG PO TABS
80.0000 mg | ORAL_TABLET | Freq: Every day | ORAL | Status: DC
  Administered 2020-02-11 – 2020-02-15 (×5): 80 mg via ORAL
  Filled 2020-02-10 (×5): qty 1

## 2020-02-10 MED ORDER — LEVALBUTEROL HCL 0.63 MG/3ML IN NEBU: 0.6300 mg | INHALATION_SOLUTION | Freq: Four times a day (QID) | RESPIRATORY_TRACT | Status: DC

## 2020-02-10 MED ORDER — SUCCINYLCHOLINE CHLORIDE 200 MG/10ML IV SOSY
PREFILLED_SYRINGE | INTRAVENOUS | Status: DC | PRN
  Administered 2020-02-10: 120 mg via INTRAVENOUS

## 2020-02-10 MED ORDER — DIGOXIN 125 MCG PO TABS
0.1250 mg | ORAL_TABLET | ORAL | Status: DC
  Administered 2020-02-11 – 2020-02-15 (×3): 0.125 mg via ORAL
  Filled 2020-02-10 (×3): qty 1

## 2020-02-10 MED ORDER — FENTANYL CITRATE (PF) 250 MCG/5ML IJ SOLN
INTRAMUSCULAR | Status: AC
  Filled 2020-02-10: qty 5

## 2020-02-10 MED ORDER — VITAMIN B-12 1000 MCG PO TABS
2000.0000 ug | ORAL_TABLET | Freq: Every day | ORAL | Status: DC
  Administered 2020-02-11 – 2020-02-16 (×6): 2000 ug via ORAL
  Filled 2020-02-10 (×7): qty 2

## 2020-02-10 MED ORDER — METOPROLOL TARTRATE 5 MG/5ML IV SOLN
5.0000 mg | Freq: Once | INTRAVENOUS | Status: AC
  Administered 2020-02-10: 15:00:00 5 mg via INTRAVENOUS
  Filled 2020-02-10: qty 5

## 2020-02-10 MED ORDER — SUCRALFATE 1 GM/10ML PO SUSP
1.0000 g | Freq: Three times a day (TID) | ORAL | Status: DC
  Administered 2020-02-10 – 2020-02-16 (×22): 1 g via ORAL
  Filled 2020-02-10 (×23): qty 10

## 2020-02-10 MED ORDER — GLUCAGON HCL RDNA (DIAGNOSTIC) 1 MG IJ SOLR: 1.0000 mg | Freq: Once | INTRAMUSCULAR | Status: DC

## 2020-02-10 MED ORDER — TAMSULOSIN HCL 0.4 MG PO CAPS
0.4000 mg | ORAL_CAPSULE | Freq: Every day | ORAL | Status: DC
  Administered 2020-02-11 – 2020-02-16 (×6): 0.4 mg via ORAL
  Filled 2020-02-10 (×6): qty 1

## 2020-02-10 SURGICAL SUPPLY — 14 items

## 2020-02-10 NOTE — Interval H&P Note (Signed)
History and Physical Interval Note: Anesthesia now available to proceed with EGD. I have discussed risks / benefits he wishes to proceed. He will be electively intubated for this case for airway protection.   02/10/2020 8:10 PM  Chris Braun  has presented today for surgery, with the diagnosis of Food impaction.  The various methods of treatment have been discussed with the patient and family. After consideration of risks, benefits and other options for treatment, the patient has consented to  Procedure(s): ESOPHAGOGASTRODUODENOSCOPY (EGD) WITH PROPOFOL (N/A) as a surgical intervention.  The patient's history has been reviewed, patient examined, no change in status, stable for surgery.  I have reviewed the patient's chart and labs.  Questions were answered to the patient's satisfaction.     [place]

## 2020-02-10 NOTE — Anesthesia Preprocedure Evaluation (Signed)
Anesthesia Evaluation  Patient identified by MRN, date of birth, ID band Patient awake    Reviewed: Allergy & Precautions, NPO status , Patient's Chart, lab work & pertinent test results  Airway Mallampati: I  TM Distance: >3 FB Neck ROM: Full    Dental   Pulmonary COPD, former smoker,    Pulmonary exam normal        Cardiovascular hypertension, + CAD and + Past MI  Normal cardiovascular exam     Neuro/Psych Depression CVA    GI/Hepatic GERD  Medicated and Controlled,  Endo/Other    Renal/GU      Musculoskeletal   Abdominal   Peds  Hematology   Anesthesia Other Findings   Reproductive/Obstetrics                             Anesthesia Physical Anesthesia Plan  ASA: III  Anesthesia Plan: General   Post-op Pain Management:    Induction: Intravenous, Cricoid pressure planned and Rapid sequence  PONV Risk Score and Plan:   Airway Management Planned: Oral ETT  Additional Equipment:   Intra-op Plan:   Post-operative Plan: Extubation in OR  Informed Consent: I have reviewed the patients History and Physical, chart, labs and discussed the procedure including the risks, benefits and alternatives for the proposed anesthesia with the patient or authorized representative who has indicated his/her understanding and acceptance.       Plan Discussed with: CRNA and Surgeon  Anesthesia Plan Comments:         Anesthesia Quick Evaluation

## 2020-02-10 NOTE — Transfer of Care (Signed)
Immediate Anesthesia Transfer of Care Note  Patient: Chris Braun  Procedure(s) Performed: ESOPHAGOGASTRODUODENOSCOPY (EGD) WITH PROPOFOL (N/A ) FOREIGN BODY REMOVAL  Patient Location: PACU  Anesthesia Type:General  Level of Consciousness: awake, patient cooperative and responds to stimulation  Airway & Oxygen Therapy: Patient Spontanous Breathing and Patient connected to nasal cannula oxygen  Post-op Assessment: Report given to RN, Post -op Vital signs reviewed and stable and Patient moving all extremities X 4  Post vital signs: Reviewed and stable  Last Vitals:  Vitals Value Taken Time  BP 135/80 02/10/20 2114  Temp 36.6 C 02/10/20 2105  Pulse 80 02/10/20 2114  Resp 26 02/10/20 2114  SpO2 95 % 02/10/20 2114  Vitals shown include unvalidated device data.  Last Pain:  Vitals:   02/10/20 2105  TempSrc: Temporal  PainSc: 0-No pain         Complications: No complications documented.

## 2020-02-10 NOTE — H&P (Signed)
History and Physical    Chris Braun [id number] DOB: [birth year] DOA: 02/10/2020  PCP: Penelope Coop, FNP  Patient coming from: Home.  Patient was transferred from [facility].  Chief Complaint: Food impaction.  HPI: Chris Braun is a 84 y.o. male with history of CAD status post stenting, status post ablation for ventricular arrhythmia, history of atrial tachycardia, COPD, memory problems was brought to the ER at [facility] from [facility] as a direct transfer after patient had gone to [place] ER on February 09, 2019 1 in the evening after patient had sudden food impaction after eating.  Since there was no gastroenterologist over there patient was transferred to [facility]  This afternoon patient was electively intubated for EGD and had EGD done by Dr. Havery Moros following which the food was disimpacted but per Dr. Havery Moros patient had a difficult procedure.  Following which patient was extubated.  Patient also briefly was an atrial tachycardia requiring metoprolol 5 mg IV following which heart rate improved to the 80s sinus rhythm.  After the procedure patient has been having bouts of productive cough for which chest x-ray was done showing possible infiltrates.  Patient was afebrile and not hypoxic.  Gastroenterology Dr. Havery Moros likes to patient be observed overnight given the brief episodes of atrial tachycardia not sure patient can still be completely able to swallow. Labs noted largely unremarkable.  EKG shows atrial tachycardia Covid test was negative.  ED Course: Labs done in the ER was showing WBC count of 11.6 otherwise unremarkable.  Covid test was negative.  EKG shows atrial tachycardia.  Atrial tachycardia improved with 1 dose of metoprolol 5 mg IV.  See history of present illness for further details.  Patient at the time of my exam is post extubation is able to talk well denies any chest pain.  Has been having productive cough no active  wheezing.  Not hypoxic.  Review of Systems: As per HPI, rest all negative.   Past Medical History:  Diagnosis Date  . Acute hypoxemic respiratory failure ([place]) 05/27/2018  . AKI (acute kidney injury) ([place]) 05/27/2018  . Asthma   . Atrial tachycardia ([place]) 07/26/2018  . B12 deficiency   . CAD (coronary artery disease) 08/24/2014  . Carotid stenosis, bilateral 08/24/2014  . Cataracts, bilateral   . Compression fracture    BAck  . Compression fracture of L2 lumbar vertebra, closed, initial encounter ([place]) 10/27/2019  . COPD (chronic obstructive pulmonary disease) ([place])   . Depression   . Diverticulosis    per colonscopy  . Dyslipidemia 08/24/2014  . Falls frequently 06/03/2015  . Gait abnormality 01/19/2018  . GERD (gastroesophageal reflux disease)   . Hiatal hernia   . High cholesterol   . Hypertension   . Hypoxia   . Idiopathic progressive polyneuropathy   . Kidney stones   . Lumbar vertebral fracture, pathologic 10/27/2019  . Macular degeneration   . Memory disorder 01/19/2018  . Myocardial infarction ([place]) 2002  . 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 Osteoporosis   . Right kidney stone 05/27/2018  . Right upper lobe pneumonia 05/27/2018  . Risk for falls 05/28/2015  . Status post ablation of ventricular arrhythmia 04/27/2018  . 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 Stroke ([place])   . SVT (supraventricular tachycardia) ([place]) 12/09/2019  . Weakness of distal arms and legs 06/03/2015    Past Surgical History:  Procedure Laterality Date  . CARDIAC CATHETERIZATION  2002,   stents   .  CATARACT EXTRACTION Left 2016  . Colonscopy  2012  . CYSTOSCOPY W/ URETERAL STENT PLACEMENT Right 05/30/2018   Procedure: CYSTOSCOPY WITH RETROGRADE PYELOGRAM/URETERAL STENT PLACEMENT;  Surgeon: Irine Seal, MD;  Location: [place];  Service: Urology;  Laterality: Right;  . HIP FRACTURE SURGERY  2008   right  . HYDROCELE EXCISION  10/12/2014  . LITHOTRIPSY     2007, 2009, 2010, 2011, 2012, 2013  .  LUMBAR LAMINECTOMY/DECOMPRESSION MICRODISCECTOMY  03/15/2012   Procedure: LUMBAR LAMINECTOMY/DECOMPRESSION MICRODISCECTOMY 1 LEVEL;  Surgeon: Elaina Hoops, MD;  Location: [place] [facility];  Service: Neurosurgery;  Laterality: Left;  Left lumbar three-four decompressive lumbar laminectomy, discectomy  . MAXIMUM ACCESS (MAS)POSTERIOR LUMBAR INTERBODY FUSION (PLIF) 2 LEVEL N/A 12/08/2012   Procedure: FOR MAXIMUM ACCESS (MAS) POSTERIOR LUMBAR INTERBODY FUSION (PLIF) 2 LEVEL;  Surgeon: Elaina Hoops, MD;  Location: [place] [facility];  Service: Neurosurgery;  Laterality: N/A;  FOR MAXIMUM ACCESS (MAS) POSTERIOR LUMBAR INTERBODY FUSION (PLIF) 2 LEVEL  . ROTATOR CUFF REPAIR  1999   bil   . Blue  . TOTAL HIP ARTHROPLASTY Right 2008  . VT study     with Ablation     reports that he quit smoking about 19 years ago. His smoking use included cigarettes. He has a 45.00 pack-year smoking history. His smokeless tobacco use includes chew. He reports that he does not drink alcohol and does not use drugs.  Allergies  Allergen Reactions  . Other Nausea And Vomiting and Other (See Comments)    pneumonia vaccine- chills, vomiting, fever, lost use of legs and body function. Had a fall post inj. (05/25/2013)  . Pneumovax [Pneumococcal Polysaccharide Vaccine] Nausea And Vomiting and Other (See Comments)    Chills, loss of bodily functions, ended up in the ED  . Ibuprofen Other (See Comments)    Heart doctor advised he cannot take this  . Norco [Hydrocodone-Acetaminophen]     Must have Zofran to tolerate this  . Oxycodone Other (See Comments)    Mental status changes and causes sleepwalking    Family History  Problem Relation Age of Onset  . Hemolytic uremic syndrome Mother   . Heart disease Mother   . Stroke Father   . Heart disease Father   . Heart disease Sister   . Heart disease Sister   . Heart disease Sister   . Alzheimer's disease Sister     Prior to Admission medications    Medication Sig Start Date End Date Taking? Authorizing Provider  Acetaminophen 325 MG CAPS Take 975 mg by mouth every 8 (eight) hours as needed (pain).    Yes [provider]  albuterol (PROVENTIL HFA;VENTOLIN HFA) 108 (90 BASE) MCG/ACT inhaler Inhale 2 puffs into the lungs every 6 (six) hours as needed for wheezing.   Yes [provider]  aspirin EC 81 MG tablet Take 81 mg by mouth at bedtime.    Yes [provider]  atorvastatin (LIPITOR) 80 MG tablet Take 80 mg by mouth at bedtime.    Yes [provider]  benzonatate (TESSALON) 200 MG capsule Take 200 mg by mouth in the morning, at noon, and at bedtime.  11/19/19  Yes [provider]  calcitonin, salmon, (MIACALCIN/FORTICAL) 200 UNIT/ACT nasal spray Place 1 spray into alternate nostrils at bedtime.    Yes [provider]  Calcium Carb-Cholecalciferol (CALCIUM-VITAMIN D3) 600-200 MG-UNIT TABS Take 600 mg by mouth 2 (two) times daily.   Yes [provider]  Cholecalciferol (VITAMIN D) 50 MCG (2000 UT) CAPS Take by mouth.   Yes [provider]  clopidogrel (PLAVIX) 75 MG tablet Take 75 mg by mouth daily. 06/20/15  Yes [provider]  digoxin (LANOXIN) 0.125 MG tablet Take 1 tablet (0.125 mg total) by mouth every other day. 12/23/19  Yes Revankar, Reita Cliche, MD  donepezil (ARICEPT) 5 MG tablet Take 5 mg by mouth at bedtime.  11/05/15  Yes [provider]  Ferrous Gluconate (IRON 27 PO) Take 27 mg by mouth every evening.   Yes [provider]  guaiFENesin (MUCINEX) 600 MG 12 hr tablet Take 1,200 mg by mouth 2 (two) times daily.    Yes [provider]  ipratropium-albuterol (DUONEB) 0.5-2.5 (3) MG/3ML SOLN Take 3 mLs by nebulization 2 (two) times daily.    Yes [provider]  ketoconazole (NIZORAL) 2 % cream Apply 1 application topically daily as needed for irritation (on feet). Apply as needed to affected area.   04/25/19  Yes [provider]  lactulose (CHRONULAC) 10 GM/15ML solution Take 10 g by mouth at bedtime as needed for mild constipation.    Yes [provider]  loperamide (IMODIUM) 2 MG capsule Take 2-4 mg by mouth as needed for diarrhea or loose stools.   Yes [provider]  loratadine (CLARITIN) 10 MG tablet Take 10 mg by mouth in the morning.    Yes [provider]  metoprolol succinate (TOPROL-XL) 25 MG 24 hr tablet Take 1 tablet (25 mg total) by mouth daily. 12/11/19 01/10/20 Yes Alexandria Lodge, MD  Misc Natural Products (LUTEIN 20) CAPS Take 20 mg by mouth in the morning.   Yes [provider]  montelukast (SINGULAIR) 10 MG tablet Take 10 mg by mouth daily.    Yes [provider]  Multiple Vitamins-Minerals (MULTIVITAMIN PO) Take 1 tablet by mouth every evening.    Yes [provider]  nitroGLYCERIN (NITROSTAT) 0.4 MG SL tablet DISSOLVE 1 TABLET UNDER THE TONGUE EVERY 5 MINUTES AS NEEDED FOR CHEST PAIN. DO NOT EXCEED A TOTAL OF 3 DOSES IN 15 MINUTES. Patient taking differently: Place 0.4 mg under the tongue every 5 (five) minutes x 3 doses as needed for chest pain. 06/07/18  Yes Revankar, Reita Cliche, MD  ondansetron (ZOFRAN-ODT) 4 MG disintegrating tablet Take 4 mg by mouth every 8 (eight) hours as needed for nausea or vomiting.  12/07/19  Yes [provider]  pantoprazole (PROTONIX) 40 MG tablet Take 40 mg by mouth daily before breakfast.    Yes [provider]  potassium chloride (KLOR-CON) 10 MEQ tablet Take 10 mEq by mouth daily as needed (take with lasix).   Yes [provider]  rOPINIRole (REQUIP) 0.5 MG tablet Take 0.5 mg by mouth in the morning, at noon, and at bedtime.  09/27/16  Yes [provider]  SYMBICORT 160-4.5 MCG/ACT inhaler Inhale 2 puffs into the lungs 2 (two) times daily. 07/21/16  Yes [provider]  tamsulosin (FLOMAX) 0.4 MG CAPS capsule Take 0.4 mg by mouth daily after supper.   Yes [provider]  traMADol (ULTRAM) 50 MG tablet Take 50 mg by mouth every 6 (six) hours as needed for moderate pain.  11/23/19  Yes [provider]  verapamil (CALAN) 40 MG tablet Take one tablet twice daily as needed for heart rate greater than 90. 12/14/19  Yes Revankar, Reita Cliche, MD  vitamin B-12 (CYANOCOBALAMIN) 1000 MCG tablet Take 2,000 mcg by mouth  daily.   Yes [provider]  methocarbamol (ROBAXIN) 500 MG tablet Take 1 tablet (500 mg total) by mouth 3 (three) times daily. Patient not taking: Reported on 02/10/2020 10/31/19   Geradine Girt, DO  polyethylene glycol (MIRALAX / GLYCOLAX) 17 g packet Take 17 g by mouth daily. Patient not taking: Reported on 02/10/2020 05/31/18   Aline August, MD    Physical Exam: Constitutional: Moderately built and nourished. Vitals:   02/10/20 2125 02/10/20 2135 02/10/20 2145 02/10/20 2156  BP: 118/86 (!) 123/94 115/70 119/67  Pulse: 79 77 80 87  Resp: (!) 21 (!) 25 (!) 29 (!) 25  Temp:      TempSrc:      SpO2: 91% 95% 90% 92%  Weight:      Height:       Eyes: Anicteric no pallor. ENMT: No discharge from the ears eyes nose or mouth. Neck: No mass felt.  No neck rigidity. Respiratory: No rhonchi or crepitations. Cardiovascular: S1-S2 heard. Abdomen: Soft nontender bowel sounds present. Musculoskeletal: No edema. Skin: No rash. Neurologic: Alert awake oriented to time place and person.  Moves all extremities. Psychiatric: Appears normal.  Normal affect.   Labs on Admission: I have personally reviewed following labs and imaging studies  CBC: Recent Labs  Lab 02/10/20 1345  WBC 11.6*  NEUTROABS 9.1*  HGB 15.2  HCT 44.0  MCV 92.1  PLT 123456   Basic Metabolic Panel: Recent Labs  Lab 02/10/20 1345  NA 142  K 3.7  CL 102  CO2 27  GLUCOSE 95  BUN 14  CREATININE 0.99  CALCIUM 9.2   GFR: Estimated Creatinine Clearance: 49.7 mL/min (by C-G formula based on SCr of 0.99 mg/dL). Liver Function Tests: No results  for input(s): AST, ALT, ALKPHOS, BILITOT, PROT, ALBUMIN in the last 168 hours. No results for input(s): LIPASE, AMYLASE in the last 168 hours. No results for input(s): AMMONIA in the last 168 hours. Coagulation Profile: No results for input(s): INR, PROTIME in the last 168 hours. Cardiac Enzymes: No results for input(s): CKTOTAL, CKMB, CKMBINDEX, TROPONINI in the last 168 hours. BNP (last 3 results) No results for input(s): PROBNP in the last 8760 hours. HbA1C: No results for input(s): HGBA1C in the last 72 hours. CBG: No results for input(s): GLUCAP in the last 168 hours. Lipid Profile: No results for input(s): CHOL, HDL, LDLCALC, TRIG, CHOLHDL, LDLDIRECT in the last 72 hours. Thyroid Function Tests: No results for input(s): TSH, T4TOTAL, FREET4, T3FREE, THYROIDAB in the last 72 hours. Anemia Panel: No results for input(s): VITAMINB12, FOLATE, FERRITIN, TIBC, IRON, RETICCTPCT in the last 72 hours. Urine analysis:    Component Value Date/Time   COLORURINE YELLOW 12/09/2019 [place] 12/09/2019 1757   LABSPEC 1.016 12/09/2019 1757   PHURINE 5.0 12/09/2019 1757   GLUCOSEU NEGATIVE 12/09/2019 1757   HGBUR NEGATIVE 12/09/2019 1757   BILIRUBINUR NEGATIVE 12/09/2019 1757   KETONESUR NEGATIVE 12/09/2019 1757   PROTEINUR NEGATIVE 12/09/2019 1757   NITRITE NEGATIVE 12/09/2019 1757   LEUKOCYTESUR NEGATIVE 12/09/2019 1757   Sepsis Labs: @LABRCNTIP (procalcitonin:4,lacticidven:4) ) Recent Results (from the past 240 hour(s))  Resp Panel by RT-PCR (Flu A&B, Covid) Nasopharyngeal Swab     Status: None   Collection Time: 02/10/20  1:45 PM   Specimen: Nasopharyngeal Swab; Nasopharyngeal(NP) swabs in vial transport medium  Result Value Ref Range Status   SARS Coronavirus 2 by RT PCR NEGATIVE NEGATIVE Final    Comment: (NOTE) SARS-CoV-2 target nucleic acids are NOT DETECTED.  The SARS-CoV-2 RNA is generally detectable in upper respiratory specimens during the acute phase of  infection. The lowest concentration of SARS-CoV-2 viral copies this assay can detect is 138 copies/mL. A negative result does not preclude SARS-Cov-2 infection and should not be used as the sole basis for treatment or other patient management decisions. A negative result may occur with  improper specimen collection/handling, submission of specimen other than nasopharyngeal swab, presence of viral mutation(s) within the areas targeted by this assay, and inadequate number of viral copies(<138 copies/mL). A negative result must be combined with clinical observations, patient history, and epidemiological information. The expected result is Negative.  Fact Sheet for Patients:  EntrepreneurPulse.com.au  Fact Sheet for Healthcare Providers:  IncredibleEmployment.be  This test is no t yet approved or cleared by the [place] FDA and  has been authorized for detection and/or diagnosis of SARS-CoV-2 by FDA under an Emergency Use Authorization (EUA). This EUA will remain  in effect (meaning this test can be used) for the duration of the COVID-19 declaration under Section 564(b)(1) of the Act, 21 U.S.C.section 360bbb-3(b)(1), unless the authorization is terminated  or revoked sooner.       Influenza A by PCR NEGATIVE NEGATIVE Final   Influenza B by PCR NEGATIVE NEGATIVE Final    Comment: (NOTE) The Xpert Xpress SARS-CoV-2/FLU/RSV plus assay is intended as an aid in the diagnosis of influenza from Nasopharyngeal swab specimens and should not be used as a sole basis for treatment. Nasal washings and aspirates are unacceptable for Xpert Xpress SARS-CoV-2/FLU/RSV testing.  Fact Sheet for Patients: EntrepreneurPulse.com.au  Fact Sheet for Healthcare Providers: IncredibleEmployment.be  This test is not yet approved or cleared by the [place] FDA and has been authorized for detection and/or diagnosis of SARS-CoV-2  by FDA under an Emergency Use Authorization (EUA). This EUA will remain in effect (meaning this test can be used) for the duration of the COVID-19 declaration under Section 564(b)(1) of the Act, 21 U.S.C. section 360bbb-3(b)(1), unless the authorization is terminated or revoked.  Performed at [facility], [place] [address], [place], [place] [address]      Radiological Exams on Admission: DG CHEST PORT 1 VIEW  Result Date: 02/10/2020 CLINICAL DATA:  Aspiration into airway. EXAM: PORTABLE CHEST 1 VIEW COMPARISON:  January 11, 2020 FINDINGS: Decreased lung volumes are seen, likely secondary to the degree of patient inspiration. Moderate severity diffuse chronic appearing increased interstitial lung markings are seen. Mild atelectasis and/or infiltrate is noted within the left lung base. There is no evidence of a pleural effusion or pneumothorax. The heart size and mediastinal contours are within normal limits. Marked severity calcification of the aortic arch is seen. There is moderate severity dextroscoliosis of the thoracic spine with evidence of prior vertebroplasty seen within the mid and lower thoracic spine. IMPRESSION: Chronic appearing increased interstitial lung markings with mild left basilar atelectasis and/or infiltrate. Electronically Signed   By: Virgina Norfolk M.D.   On: 02/10/2020 21:23    EKG: Independently reviewed.  Atrial tachycardia.  Assessment/Plan Principal Problem:   Food impaction of esophagus Active Problems:   Memory disorder   Status post ablation of ventricular arrhythmia   Atrial tachycardia ([place])    1. Food impaction status post EGD and food has been disimpacted.  Discussed with Dr. Havery Moros who advised clear liquid diet Protonix and Carafate. 2. Atrial tachycardia improved with 1 dose of metoprolol IV.  Will continue home dose of Toprol-XL 25 mg which I have ordered 1 dose  now and also patient is on digoxin.  Check digoxin level.  Continue to  monitor in telemetry. 3. Possible aspiration we will keep patient on Unasyn and nebulizer treatment. 4. COPD not actively wheezing.  Continue home inhalers. 5. History of CAD status post stenting on Plavix statins and beta-blockers. 6. History of antral erythema status post ablation. 7. History of memory issues on Aricept. 8. Restless leg syndrome on ropinirole.   DVT prophylaxis: SCDs for now until we make sure patient does not require any further procedure. Code Status: Full code. Family Communication: Patient's wife. Disposition Plan: Home. Consults called: Gastroenterologist. Admission status: Observation.   Rise Patience MD [facility] Pager [phone].  If 7PM-7AM, please contact night-coverage [email or web address] Password [email or web address]  02/10/2020, 10:14 PM

## 2020-02-10 NOTE — ED Provider Notes (Signed)
[facility] EMERGENCY DEPARTMENT Provider Note   CSN: [id number] Arrival date & time: 02/10/20  1242     History Chief Complaint  Patient presents with  . food bolus  . atrial tach    Chris Braun is a 84 y.o. male with a past medical history of hiatal hernia, atrial tachycardia, prior MI, esophageal stricture presenting to the ED with a chief complaint of food bolus.  Wife states that approximately 21 hours ago patient was eating beef stew for dinner when she felt like it was stuck in his esophagus.  Since then he has been unable to keep liquids down and not been taking his home medications.  There was seen and evaluated at another facility but unfortunately did not have any GI evaluation.  Patient reports history of esophageal strictures that were unable to be treated in the past due to his chronic medical issues.  Denies any vomiting.  Patient denies any complaints of chest pain, palpitations, shortness of breath or fever.  HPI     Past Medical History:  Diagnosis Date  . Acute hypoxemic respiratory failure ([place]) 05/27/2018  . AKI (acute kidney injury) ([place]) 05/27/2018  . Asthma   . Atrial tachycardia ([place]) 07/26/2018  . B12 deficiency   . CAD (coronary artery disease) 08/24/2014  . Carotid stenosis, bilateral 08/24/2014  . Cataracts, bilateral   . Compression fracture    BAck  . Compression fracture of L2 lumbar vertebra, closed, initial encounter ([place]) 10/27/2019  . COPD (chronic obstructive pulmonary disease) ([place])   . Depression   . Diverticulosis    per colonscopy  . Dyslipidemia 08/24/2014  . Falls frequently 06/03/2015  . Gait abnormality 01/19/2018  . GERD (gastroesophageal reflux disease)   . Hiatal hernia   . High cholesterol   . Hypertension   . Hypoxia   . Idiopathic progressive polyneuropathy   . Kidney stones   . Lumbar vertebral fracture, pathologic 10/27/2019  . Macular degeneration   . Memory disorder 01/19/2018  . Myocardial infarction ([place])  2002  . 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 Osteoporosis   . Right kidney stone 05/27/2018  . Right upper lobe pneumonia 05/27/2018  . Risk for falls 05/28/2015  . Status post ablation of ventricular arrhythmia 04/27/2018  . 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 Stroke ([place])   . SVT (supraventricular tachycardia) ([place]) 12/09/2019  . Weakness of distal arms and legs 06/03/2015    Patient Active Problem List   Diagnosis Date Noted  . SVT (supraventricular tachycardia) ([place]) 12/09/2019  . Cataracts, bilateral   . Depression   . Kidney stones   . Lumbar vertebral fracture, pathologic 10/27/2019  . Compression fracture of L2 lumbar vertebra, closed, initial encounter ([place]) 10/27/2019  . Hypoxia   . Atrial tachycardia ([place]) 07/26/2018  . Stroke ([place])   . Osteoporosis   . Macular degeneration   . Idiopathic progressive polyneuropathy   . Hypertension   . High cholesterol   . Hiatal hernia   . GERD (gastroesophageal reflux disease)   . Asthma   . COPD (chronic obstructive pulmonary disease) ([place])   . Diverticulosis   . B12 deficiency   . Acute hypoxemic respiratory failure ([place]) 05/27/2018  . AKI (acute kidney injury) ([place]) 05/27/2018  . Right kidney stone 05/27/2018  . Right upper lobe pneumonia 05/27/2018  . Status post ablation of ventricular arrhythmia 04/27/2018  . Memory disorder 01/19/2018  . Gait abnormality 01/19/2018  . 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  Falls frequently 06/03/2015  . Weakness of distal arms and legs 06/03/2015  . Risk for falls 05/28/2015  . CAD (coronary artery disease) 08/24/2014  . Carotid stenosis, bilateral 08/24/2014  . Dyslipidemia 08/24/2014  . Stroke (cerebrum) ([place]) 01/17/2014  . Myocardial infarction [facility]) 2002    Past Surgical History:  Procedure Laterality Date  . CARDIAC CATHETERIZATION  2002,   stents   . CATARACT EXTRACTION Left 2016  . Colonscopy  2012  . CYSTOSCOPY W/ URETERAL STENT PLACEMENT Right  05/30/2018   Procedure: CYSTOSCOPY WITH RETROGRADE PYELOGRAM/URETERAL STENT PLACEMENT;  Surgeon: Irine Seal, MD;  Location: [facility];  Service: Urology;  Laterality: Right;  . HIP FRACTURE SURGERY  2008   right  . HYDROCELE EXCISION  10/12/2014  . LITHOTRIPSY     2007, 2009, 2010, 2011, 2012, 2013  . LUMBAR LAMINECTOMY/DECOMPRESSION MICRODISCECTOMY  03/15/2012   Procedure: LUMBAR LAMINECTOMY/DECOMPRESSION MICRODISCECTOMY 1 LEVEL;  Surgeon: Elaina Hoops, MD;  Location: [place] [facility];  Service: Neurosurgery;  Laterality: Left;  Left lumbar three-four decompressive lumbar laminectomy, discectomy  . MAXIMUM ACCESS (MAS)POSTERIOR LUMBAR INTERBODY FUSION (PLIF) 2 LEVEL N/A 12/08/2012   Procedure: FOR MAXIMUM ACCESS (MAS) POSTERIOR LUMBAR INTERBODY FUSION (PLIF) 2 LEVEL;  Surgeon: Elaina Hoops, MD;  Location: [place] [facility];  Service: Neurosurgery;  Laterality: N/A;  FOR MAXIMUM ACCESS (MAS) POSTERIOR LUMBAR INTERBODY FUSION (PLIF) 2 LEVEL  . ROTATOR CUFF REPAIR  1999   bil   . [place]  . TOTAL HIP ARTHROPLASTY Right 2008  . VT study     with Ablation       Family History  Problem Relation Age of Onset  . Hemolytic uremic syndrome Mother   . Heart disease Mother   . Stroke Father   . Heart disease Father   . Heart disease Sister   . Heart disease Sister   . Heart disease Sister   . Alzheimer's disease Sister     Social History   Tobacco Use  . Smoking status: Former Smoker    Packs/day: 1.00    Years: 45.00    Pack years: 45.00    Types: Cigarettes    Quit date: 02/02/2001    Years since quitting: 19.0  . Smokeless tobacco: Current User    Types: Chew  Vaping Use  . Vaping Use: Never used  Substance Use Topics  . Alcohol use: No  . Drug use: No    Home Medications Prior to Admission medications   Medication Sig Start Date End Date Taking? Authorizing Provider  Acetaminophen 325 MG CAPS Take 975 mg by mouth every 8 (eight) hours as needed (pain).     Yes [provider]  albuterol (PROVENTIL HFA;VENTOLIN HFA) 108 (90 BASE) MCG/ACT inhaler Inhale 2 puffs into the lungs every 6 (six) hours as needed for wheezing.   Yes [provider]  aspirin EC 81 MG tablet Take 81 mg by mouth at bedtime.    Yes [provider]  atorvastatin (LIPITOR) 80 MG tablet Take 80 mg by mouth at bedtime.    Yes [provider]  benzonatate (TESSALON) 200 MG capsule Take 200 mg by mouth in the morning, at noon, and at bedtime.  11/19/19  Yes [provider]  calcitonin, salmon, (MIACALCIN/FORTICAL) 200 UNIT/ACT nasal spray Place 1 spray into alternate nostrils at bedtime.    Yes [provider]  Calcium Carb-Cholecalciferol (CALCIUM-VITAMIN D3) 600-200 MG-UNIT TABS Take 600 mg by mouth 2 (two) times  daily.   Yes [provider]  Cholecalciferol (VITAMIN D) 50 MCG (2000 UT) CAPS Take by mouth.   Yes [provider]  clopidogrel (PLAVIX) 75 MG tablet Take 75 mg by mouth daily. 06/20/15  Yes [provider]  digoxin (LANOXIN) 0.125 MG tablet Take 1 tablet (0.125 mg total) by mouth every other day. 12/23/19  Yes Revankar, Reita Cliche, MD  donepezil (ARICEPT) 5 MG tablet Take 5 mg by mouth at bedtime.  11/05/15  Yes [provider]  Ferrous Gluconate (IRON 27 PO) Take 27 mg by mouth every evening.   Yes [provider]  guaiFENesin (MUCINEX) 600 MG 12 hr tablet Take 1,200 mg by mouth 2 (two) times daily.    Yes [provider]  ipratropium-albuterol (DUONEB) 0.5-2.5 (3) MG/3ML SOLN Take 3 mLs by nebulization 2 (two) times daily.    Yes [provider]  ketoconazole (NIZORAL) 2 % cream Apply 1 application topically daily as needed for irritation (on feet). Apply as needed to affected area.   04/25/19  Yes [provider]  lactulose (CHRONULAC) 10 GM/15ML solution Take 10 g by mouth at bedtime as needed for mild constipation.    Yes [provider]   loperamide (IMODIUM) 2 MG capsule Take 2-4 mg by mouth as needed for diarrhea or loose stools.   Yes [provider]  loratadine (CLARITIN) 10 MG tablet Take 10 mg by mouth in the morning.    Yes [provider]  metoprolol succinate (TOPROL-XL) 25 MG 24 hr tablet Take 1 tablet (25 mg total) by mouth daily. 12/11/19 01/10/20 Yes Alexandria Lodge, MD  Misc Natural Products (LUTEIN 20) CAPS Take 20 mg by mouth in the morning.   Yes [provider]  montelukast (SINGULAIR) 10 MG tablet Take 10 mg by mouth daily.    Yes [provider]  Multiple Vitamins-Minerals (MULTIVITAMIN PO) Take 1 tablet by mouth every evening.    Yes [provider]  nitroGLYCERIN (NITROSTAT) 0.4 MG SL tablet DISSOLVE 1 TABLET UNDER THE TONGUE EVERY 5 MINUTES AS NEEDED FOR CHEST PAIN. DO NOT EXCEED A TOTAL OF 3 DOSES IN 15 MINUTES. Patient taking differently: Place 0.4 mg under the tongue every 5 (five) minutes x 3 doses as needed for chest pain. 06/07/18  Yes Revankar, Reita Cliche, MD  ondansetron (ZOFRAN-ODT) 4 MG disintegrating tablet Take 4 mg by mouth every 8 (eight) hours as needed for nausea or vomiting.  12/07/19  Yes [provider]  pantoprazole (PROTONIX) 40 MG tablet Take 40 mg by mouth daily before breakfast.    Yes [provider]  potassium chloride (KLOR-CON) 10 MEQ tablet Take 10 mEq by mouth daily as needed (take with lasix).   Yes [provider]  rOPINIRole (REQUIP) 0.5 MG tablet Take 0.5 mg by mouth in the morning, at noon, and at bedtime.  09/27/16  Yes [provider]  SYMBICORT 160-4.5 MCG/ACT inhaler Inhale 2 puffs into the lungs 2 (two) times daily. 07/21/16  Yes [provider]  tamsulosin (FLOMAX) 0.4 MG CAPS capsule Take 0.4 mg by mouth daily after supper.   Yes [provider]  traMADol (ULTRAM) 50 MG tablet Take 50 mg by mouth every 6 (six) hours as needed for moderate pain.  11/23/19  Yes [provider]   verapamil (CALAN) 40 MG tablet Take one tablet twice daily as needed for heart rate greater than 90. 12/14/19  Yes Revankar, Reita Cliche, MD  vitamin B-12 (CYANOCOBALAMIN) 1000 MCG  tablet Take 2,000 mcg by mouth daily.   Yes [provider]  methocarbamol (ROBAXIN) 500 MG tablet Take 1 tablet (500 mg total) by mouth 3 (three) times daily. Patient not taking: Reported on 02/10/2020 10/31/19   Geradine Girt, DO  polyethylene glycol (MIRALAX / GLYCOLAX) 17 g packet Take 17 g by mouth daily. Patient not taking: Reported on 02/10/2020 05/31/18   Aline August, MD    Allergies    Other, Pneumovax [pneumococcal polysaccharide vaccine], Ibuprofen, Norco [hydrocodone-acetaminophen], and Oxycodone  Review of Systems   Review of Systems  Constitutional: Negative for appetite change, chills and fever.  HENT: Negative for ear pain, rhinorrhea, sneezing and sore throat.   Eyes: Negative for photophobia and visual disturbance.  Respiratory: Negative for cough, chest tightness, shortness of breath and wheezing.   Cardiovascular: Negative for chest pain and palpitations.  Gastrointestinal: Negative for abdominal pain, blood in stool, constipation, diarrhea, nausea and vomiting.  Genitourinary: Negative for dysuria, hematuria and urgency.  Musculoskeletal: Negative for myalgias.  Skin: Negative for rash.  Neurological: Negative for dizziness, weakness and light-headedness.    Physical Exam Updated Vital Signs BP (!) 148/73   Pulse (!) 108   Temp 98.6 F (37 C) (Oral)   Resp (!) 25   Ht 5\' 2"  (1.575 m)   Wt 70.9 kg   SpO2 92%   BMI 28.59 kg/m   Physical Exam Vitals and nursing note reviewed.  Constitutional:      General: He is not in acute distress.    Appearance: He is well-developed and well-nourished.  HENT:     Head: Normocephalic and atraumatic.     Nose: Nose normal.  Eyes:     General: No scleral icterus.       Right eye: No discharge.        Left eye: No discharge.      Extraocular Movements: EOM normal.     Conjunctiva/sclera: Conjunctivae normal.  Cardiovascular:     Rate and Rhythm: Regular rhythm. Tachycardia present.     Pulses: Intact distal pulses.     Heart sounds: Normal heart sounds. No murmur heard. No friction rub. No gallop.   Pulmonary:     Effort: Pulmonary effort is normal. No respiratory distress.     Breath sounds: Normal breath sounds.  Abdominal:     General: Bowel sounds are normal. There is no distension.     Palpations: Abdomen is soft.     Tenderness: There is no abdominal tenderness. There is no guarding.  Musculoskeletal:        General: No edema. Normal range of motion.     Cervical back: Normal range of motion and neck supple.  Skin:    General: Skin is warm and dry.     Findings: No rash.  Neurological:     Mental Status: He is alert.     Motor: No abnormal muscle tone.     Coordination: Coordination normal.  Psychiatric:        Mood and Affect: Mood and affect normal.     ED Results / Procedures / Treatments   Labs (all labs ordered are listed, but only abnormal results are displayed) Labs Reviewed  CBC WITH DIFFERENTIAL/PLATELET - Abnormal; Notable for the following components:      Result Value   WBC 11.6 (*)    Neutro Abs 9.1 (*)    Abs Immature Granulocytes 0.13 (*)    All other components within normal limits  RESP PANEL BY RT-PCR (  FLU A&B, COVID) [id number]  BASIC METABOLIC PANEL    EKG None  Radiology No results found.  Procedures Procedures (including critical care time)  Medications Ordered in ED Medications  metoprolol tartrate (LOPRESSOR) injection 5 mg (5 mg Intravenous Given 02/10/20 1430)  glucagon (human recombinant) (GLUCAGEN) injection 1 mg (1 mg Intramuscular Given 02/10/20 1435)    ED Course  I have reviewed the triage vital signs and the nursing notes.  Pertinent labs & imaging results that were available during my care of the patient were reviewed by me and considered in my  medical decision making (see chart for details).  Clinical Course as of 02/10/20 1505  Fri Feb 10, 2020  1448 GI states that anesthesia is unavailable to do the endoscopy until 6 or 7 PM.  They are asking [place] for procedural sedation at the bedside in the ER here. [HK]  1452 We will have GI complete the endoscopy with anesthesia. [HK]    Clinical Course User Index [HK] Delia Heady, PA-C   MDM Rules/Calculators/A&P                          84 year old male with past medical history of hiatal hernia, atrial tachycardia, prior MI, esophageal stricture presenting to the ED with a chief complaint of possible food bolus. Wife states that at approximately 4 PM yesterday he was eating beef stew for dinner when she felt like it was stuck in his esophagus. He is unable to tolerate liquids since then. Unfortunately has not had a GI evaluation due to long wait times in the ER as well as availability in an outside ER. He has not been taking any of his home medication secondary to his intolerance. Reports history of esophageal strictures are unable to be treated in the past due to chronic medical issues. Patient on arrival here with atrial tachycardia with rates ranging from 70s to 130s. I feel this is most likely due to not being able to take his home medications. He was given IV Lopressor here. He continues to be unable to tolerate liquids. He appears in no acute distress. Will order baseline lab work including Covid test. I have consulted GI who will see the patient at the bedside and have requested that we try glucagon to evaluate for improvement.  No improvement noted with glucagon. He will be taken to the endoscopy suite for treatment. He will need evaluation in the ER after this to see if his heart rate has improved. He remains in no acute distress at this time.   Portions of this note were generated with [employer]. Dictation errors may occur despite best attempts at proofreading.  Final  Clinical Impression(s) / ED Diagnoses Final diagnoses:  Atrial tachycardia ([place])  Food bolus obstruction of intestine [facility])    Rx / DC Orders ED Discharge Orders    None       Delia Heady, PA-C 02/10/20 [place], [place], DO 02/10/20 1545

## 2020-02-10 NOTE — ED Triage Notes (Signed)
Pt to ED via [facility] from home-- was seen at [facility] last night for choking episode on stew beef and rice. Family states that [facility] did not do anything for him and he is still unable to swallow anything. Minimal intake today.   Was seen here yesterday also-

## 2020-02-10 NOTE — H&P (View-Only) (Signed)
 Referring Provider:  ED PA, Hina Khatri Primary Care Physician:  Perry, Melisa W, FNP Primary Gastroenterologist:  Unassigned  Reason for Consultation:  Food impaction  HPI: Chris Braun is a 84 y.o. male with a past medical history of atrial tachycardia, prior MI (on Plavix and ASA 81 mg daily), COPD, and multiple other medical problems as listed below.  He presented to the ED with a chief complaint of food bolus.  He was eating beef stew and rice last evening around 4 PM when food got stuck in his esophagus.  Apparently then went to [facility] and they did not have GI coverage so they were told to come here.  They waited in the ED waiting area but the patient became agitated and left.  He still could not swallow so they returned again today.  He is unable to swallow even a tsp of water.  He follows with Dr. Butler of GI in [place].  He actually had an esophagram performed in April of this year that showed the following:  IMPRESSION: 1. Somewhat limited exam due to limited patient mobility. 2. Large sliding-type hiatal hernia. 3. Esophageal tortuosity and mild spasticity. 4. No mucosal abnormality, stricture or mass noted in the esophagus. 5. A 13 mm band tablet passed into the stomach easily.  He is tachycardic in the 130s-140s.  Receiving IV lopressor.  Past Medical History:  Diagnosis Date  . Acute hypoxemic respiratory failure (HCC) 05/27/2018  . AKI (acute kidney injury) (HCC) 05/27/2018  . Asthma   . Atrial tachycardia (HCC) 07/26/2018  . B12 deficiency   . CAD (coronary artery disease) 08/24/2014  . Carotid stenosis, bilateral 08/24/2014  . Cataracts, bilateral   . Compression fracture    BAck  . Compression fracture of L2 lumbar vertebra, closed, initial encounter (HCC) 10/27/2019  . COPD (chronic obstructive pulmonary disease) (HCC)   . Depression   . Diverticulosis    per colonscopy  . Dyslipidemia 08/24/2014  . Falls frequently 06/03/2015  . Gait abnormality 01/19/2018   . GERD (gastroesophageal reflux disease)   . Hiatal hernia   . High cholesterol   . Hypertension   . Hypoxia   . Idiopathic progressive polyneuropathy   . Kidney stones   . Lumbar vertebral fracture, pathologic 10/27/2019  . Macular degeneration   . Memory disorder 01/19/2018  . Myocardial infarction (HCC) 2002  . 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 Osteoporosis   . Right kidney stone 05/27/2018  . Right upper lobe pneumonia 05/27/2018  . Risk for falls 05/28/2015  . Status post ablation of ventricular arrhythmia 04/27/2018  . 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 Stroke (HCC)   . SVT (supraventricular tachycardia) (HCC) 12/09/2019  . Weakness of distal arms and legs 06/03/2015    Past Surgical History:  Procedure Laterality Date  . CARDIAC CATHETERIZATION  2002,   stents   . CATARACT EXTRACTION Left 2016  . Colonscopy  2012  . CYSTOSCOPY W/ URETERAL STENT PLACEMENT Right 05/30/2018   Procedure: CYSTOSCOPY WITH RETROGRADE PYELOGRAM/URETERAL STENT PLACEMENT;  Surgeon: Wrenn, John, MD;  Location: [facility] OR;  Service: Urology;  Laterality: Right;  . HIP FRACTURE SURGERY  2008   right  . HYDROCELE EXCISION  10/12/2014  . LITHOTRIPSY     2007, 2009, 2010, 2011, 2012, 2013  . LUMBAR LAMINECTOMY/DECOMPRESSION MICRODISCECTOMY  03/15/2012   Procedure: LUMBAR LAMINECTOMY/DECOMPRESSION MICRODISCECTOMY 1 LEVEL;  Surgeon: Gary P Cram, MD;  Location: [facility] NEURO ORS;  Service: Neurosurgery;  Laterality: Left;    Left lumbar three-four decompressive lumbar laminectomy, discectomy  . MAXIMUM ACCESS (MAS)POSTERIOR LUMBAR INTERBODY FUSION (PLIF) 2 LEVEL N/A 12/08/2012   Procedure: FOR MAXIMUM ACCESS (MAS) POSTERIOR LUMBAR INTERBODY FUSION (PLIF) 2 LEVEL;  Surgeon: Elaina Hoops, MD;  Location: [facility];  Service: Neurosurgery;  Laterality: N/A;  FOR MAXIMUM ACCESS (MAS) POSTERIOR LUMBAR INTERBODY FUSION (PLIF) 2 LEVEL  . ROTATOR CUFF REPAIR  1999   bil   . [place]  .  TOTAL HIP ARTHROPLASTY Right 2008  . VT study     with Ablation    Prior to Admission medications   Medication Sig Start Date End Date Taking? Authorizing Provider  Acetaminophen 325 MG CAPS Take 975 mg by mouth every 8 (eight) hours as needed (pain).    Yes [provider]  albuterol (PROVENTIL HFA;VENTOLIN HFA) 108 (90 BASE) MCG/ACT inhaler Inhale 2 puffs into the lungs every 6 (six) hours as needed for wheezing.   Yes [provider]  aspirin EC 81 MG tablet Take 81 mg by mouth at bedtime.    Yes [provider]  atorvastatin (LIPITOR) 80 MG tablet Take 80 mg by mouth at bedtime.    Yes [provider]  benzonatate (TESSALON) 200 MG capsule Take 200 mg by mouth in the morning, at noon, and at bedtime.  11/19/19  Yes [provider]  calcitonin, salmon, (MIACALCIN/FORTICAL) 200 UNIT/ACT nasal spray Place 1 spray into alternate nostrils at bedtime.    Yes [provider]  Cholecalciferol (VITAMIN D) 50 MCG (2000 UT) CAPS Take by mouth.   Yes [provider]  clopidogrel (PLAVIX) 75 MG tablet Take 75 mg by mouth daily. 06/20/15  Yes [provider]  digoxin (LANOXIN) 0.125 MG tablet Take 1 tablet (0.125 mg total) by mouth every other day. 12/23/19  Yes Revankar, Reita Cliche, MD  donepezil (ARICEPT) 5 MG tablet Take 5 mg by mouth at bedtime.  11/05/15  Yes [provider]  Ferrous Gluconate (IRON 27 PO) Take 27 mg by mouth every evening.   Yes [provider]  guaiFENesin (MUCINEX) 600 MG 12 hr tablet Take 1,200 mg by mouth 2 (two) times daily.    Yes [provider]  ipratropium-albuterol (DUONEB) 0.5-2.5 (3) MG/3ML SOLN Take 3 mLs by nebulization 2 (two) times daily.    Yes [provider]  ketoconazole (NIZORAL) 2 % cream Apply 1 application topically daily as needed for irritation (on feet). Apply as needed to affected area.   04/25/19  Yes [provider]  lactulose (CHRONULAC) 10  GM/15ML solution Take 10 g by mouth at bedtime as needed for mild constipation.    Yes [provider]  loperamide (IMODIUM) 2 MG capsule Take 2-4 mg by mouth as needed for diarrhea or loose stools.   Yes [provider]  loratadine (CLARITIN) 10 MG tablet Take 10 mg by mouth in the morning.    Yes [provider]  metoprolol succinate (TOPROL-XL) 25 MG 24 hr tablet Take 1 tablet (25 mg total) by mouth daily. 12/11/19 01/10/20 Yes Alexandria Lodge, MD  Misc Natural Products (LUTEIN 20) CAPS Take 20 mg by mouth in the morning.   Yes [provider]  montelukast (SINGULAIR) 10 MG tablet Take 10 mg by mouth daily.    Yes [provider]  Multiple Vitamins-Minerals (MULTIVITAMIN PO) Take 1 tablet by mouth every evening.    Yes [provider]  nitroGLYCERIN (NITROSTAT) 0.4 MG SL  tablet DISSOLVE 1 TABLET UNDER THE TONGUE EVERY 5 MINUTES AS NEEDED FOR CHEST PAIN. DO NOT EXCEED A TOTAL OF 3 DOSES IN 15 MINUTES. Patient taking differently: Place 0.4 mg under the tongue every 5 (five) minutes x 3 doses as needed for chest pain. 06/07/18  Yes Revankar, Reita Cliche, MD  ondansetron (ZOFRAN-ODT) 4 MG disintegrating tablet Take 4 mg by mouth every 8 (eight) hours as needed for nausea or vomiting.  12/07/19  Yes [provider]  pantoprazole (PROTONIX) 40 MG tablet Take 40 mg by mouth daily before breakfast.    Yes [provider]  potassium chloride (KLOR-CON) 10 MEQ tablet Take 10 mEq by mouth daily as needed (take with lasix).   Yes [provider]  rOPINIRole (REQUIP) 0.5 MG tablet Take 0.5 mg by mouth in the morning, at noon, and at bedtime.  09/27/16  Yes [provider]  SYMBICORT 160-4.5 MCG/ACT inhaler Inhale 2 puffs into the lungs 2 (two) times daily. 07/21/16  Yes [provider]  tamsulosin (FLOMAX) 0.4 MG CAPS capsule Take 0.4 mg by mouth daily after supper.   Yes [provider]  traMADol (ULTRAM) 50 MG  tablet Take 50 mg by mouth every 6 (six) hours as needed for moderate pain.  11/23/19  Yes [provider]  verapamil (CALAN) 40 MG tablet Take one tablet twice daily as needed for heart rate greater than 90. 12/14/19  Yes Revankar, Reita Cliche, MD  vitamin B-12 (CYANOCOBALAMIN) 1000 MCG tablet Take 2,000 mcg by mouth daily.   Yes [provider]  Calcium Carb-Cholecalciferol (CALCIUM-VITAMIN D3) 600-200 MG-UNIT TABS Take 600 mg by mouth 2 (two) times daily.    [provider]  methocarbamol (ROBAXIN) 500 MG tablet Take 1 tablet (500 mg total) by mouth 3 (three) times daily. Patient not taking: Reported on 02/10/2020 10/31/19   Geradine Girt, DO  polyethylene glycol (MIRALAX / GLYCOLAX) 17 g packet Take 17 g by mouth daily. Patient not taking: Reported on 02/10/2020 05/31/18   Aline August, MD    Current Facility-Administered Medications  Medication Dose Route Frequency Provider Last Rate Last Admin  . glucagon (human recombinant) (GLUCAGEN) injection 1 mg  1 mg Intramuscular Once Khatri, Hina, PA-C      . metoprolol tartrate (LOPRESSOR) injection 5 mg  5 mg Intravenous Once Khatri, Hina, PA-C       Current Outpatient Medications  Medication Sig Dispense Refill  . Acetaminophen 325 MG CAPS Take 975 mg by mouth every 8 (eight) hours as needed (pain).     Marland Kitchen albuterol (PROVENTIL HFA;VENTOLIN HFA) 108 (90 BASE) MCG/ACT inhaler Inhale 2 puffs into the lungs every 6 (six) hours as needed for wheezing.    Marland Kitchen aspirin EC 81 MG tablet Take 81 mg by mouth at bedtime.     Marland Kitchen atorvastatin (LIPITOR) 80 MG tablet Take 80 mg by mouth at bedtime.     . benzonatate (TESSALON) 200 MG capsule Take 200 mg by mouth in the morning, at noon, and at bedtime.     . calcitonin, salmon, (MIACALCIN/FORTICAL) 200 UNIT/ACT nasal spray Place 1 spray into alternate nostrils at bedtime.     . Calcium Carb-Cholecalciferol (CALCIUM-VITAMIN D3) 600-200 MG-UNIT TABS Take 600 mg by mouth 2 (two) times daily.     . Cholecalciferol (VITAMIN D) 50 MCG (2000 UT) CAPS Take by mouth.    . clopidogrel (PLAVIX) 75 MG tablet Take 75 mg by mouth daily.    . digoxin (LANOXIN) 0.125 MG tablet  Take 1 tablet (0.125 mg total) by mouth every other day. 30 tablet 3  . donepezil (ARICEPT) 5 MG tablet Take 5 mg by mouth at bedtime.     . Ferrous Gluconate (IRON 27 PO) Take 27 mg by mouth every evening.    . guaiFENesin (MUCINEX) 600 MG 12 hr tablet Take 1,200 mg by mouth 2 (two) times daily.     . ipratropium-albuterol (DUONEB) 0.5-2.5 (3) MG/3ML SOLN Take 3 mLs by nebulization 2 (two) times daily.     . ketoconazole (NIZORAL) 2 % cream Apply 1 application topically daily as needed for irritation (on feet). Apply as needed to affected area.      . lactulose (CHRONULAC) 10 GM/15ML solution Take 10 g by mouth at bedtime as needed for mild constipation.     . loperamide (IMODIUM) 2 MG capsule Take 2-4 mg by mouth as needed for diarrhea or loose stools.    . loratadine (CLARITIN) 10 MG tablet Take 10 mg by mouth in the morning.     . metoprolol succinate (TOPROL-XL) 25 MG 24 hr tablet Take 1 tablet (25 mg total) by mouth daily. 30 tablet 0  . Misc Natural Products (LUTEIN 20) CAPS Take 20 mg by mouth in the morning.    . montelukast (SINGULAIR) 10 MG tablet Take 10 mg by mouth daily.     . Multiple Vitamins-Minerals (MULTIVITAMIN PO) Take 1 tablet by mouth every evening.     . nitroGLYCERIN (NITROSTAT) 0.4 MG SL tablet DISSOLVE 1 TABLET UNDER THE TONGUE EVERY 5 MINUTES AS NEEDED FOR CHEST PAIN. DO NOT EXCEED A TOTAL OF 3 DOSES IN 15 MINUTES. (Patient taking differently: Place 0.4 mg under the tongue every 5 (five) minutes x 3 doses as needed for chest pain.) 25 tablet 6  . ondansetron (ZOFRAN-ODT) 4 MG disintegrating tablet Take 4 mg by mouth every 8 (eight) hours as needed for nausea or vomiting.     . pantoprazole (PROTONIX) 40 MG tablet Take 40 mg by mouth daily before breakfast.     . potassium chloride (KLOR-CON) 10 MEQ  tablet Take 10 mEq by mouth daily as needed (take with lasix).    . rOPINIRole (REQUIP) 0.5 MG tablet Take 0.5 mg by mouth in the morning, at noon, and at bedtime.   3  . SYMBICORT 160-4.5 MCG/ACT inhaler Inhale 2 puffs into the lungs 2 (two) times daily.  12  . tamsulosin (FLOMAX) 0.4 MG CAPS capsule Take 0.4 mg by mouth daily after supper.    . traMADol (ULTRAM) 50 MG tablet Take 50 mg by mouth every 6 (six) hours as needed for moderate pain.     . verapamil (CALAN) 40 MG tablet Take one tablet twice daily as needed for heart rate greater than 90. 60 tablet 3  . vitamin B-12 (CYANOCOBALAMIN) 1000 MCG tablet Take 2,000 mcg by mouth daily.    . methocarbamol (ROBAXIN) 500 MG tablet Take 1 tablet (500 mg total) by mouth 3 (three) times daily. (Patient not taking: Reported on 02/10/2020)    . polyethylene glycol (MIRALAX / GLYCOLAX) 17 g packet Take 17 g by mouth daily. (Patient not taking: Reported on 02/10/2020)      Allergies as of 02/10/2020 - Review Complete 02/10/2020  Allergen Reaction Noted  . Other Nausea And Vomiting and Other (See Comments) 10/07/2017  . Pneumovax [pneumococcal polysaccharide vaccine] Nausea And Vomiting and Other (See Comments) 10/27/2019  . Ibuprofen Other (See Comments) 10/07/2017  . Norco [hydrocodone-acetaminophen]  10/27/2019  .   Oxycodone Other (See Comments) 09/26/2013    Family History  Problem Relation Age of Onset  . Hemolytic uremic syndrome Mother   . Heart disease Mother   . Stroke Father   . Heart disease Father   . Heart disease Sister   . Heart disease Sister   . Heart disease Sister   . Alzheimer's disease Sister     Social History   Socioeconomic History  . Marital status: Married    Spouse name: JoAnn Blakeman  . Number of children: 4  . Years of education: 12  . Highest education level: Not on file  Occupational History  . Occupation: Retired  Tobacco Use  . Smoking status: Former Smoker    Packs/day: 1.00    Years: 45.00     Pack years: 45.00    Types: Cigarettes    Quit date: 02/02/2001    Years since quitting: 19.0  . Smokeless tobacco: Current User    Types: Chew  Vaping Use  . Vaping Use: Never used  Substance and Sexual Activity  . Alcohol use: No  . Drug use: No  . Sexual activity: Not on file  Other Topics Concern  . Not on file  Social History Narrative   Lives with wife   Caffeine use: coffee, tea   Social Determinants of Health   Financial Resource Strain: Not on file  Food Insecurity: Not on file  Transportation Needs: Not on file  Physical Activity: Not on file  Stress: Not on file  Social Connections: Not on file  Intimate Partner Violence: Not on file    Review of Systems: ROS is O/W negative except as mentioned in HPI.  Physical Exam: Vital signs in last 24 hours: Temp:  [98 F (36.7 C)-98.6 F (37 C)] 98.6 F (37 C) (12/24 1245) Pulse Rate:  [75-141] 136 (12/24 1400) Resp:  [18-25] 25 (12/24 1400) BP: (116-146)/(74-96) 142/96 (12/24 1400) SpO2:  [90 %-95 %] 92 % (12/24 1400) Weight:  [70.9 kg] 70.9 kg (12/24 1249)   General:  Alert, frail, pleasant and cooperative in NAD Head:  Normocephalic and atraumatic. Eyes:  Sclera clear, no icterus.  Conjunctiva pink. Ears:  HOH. Mouth:  No deformity or lesions.   Lungs:  Clear throughout to auscultation.  No wheezes, crackles, or rhonchi.  Heart:  Tachycardic. Abdomen:  Soft, non-distended.  BS present.  Non-tender.  Msk:  Symmetrical without gross deformities. Pulses:  Normal pulses noted. Extremities:  Without clubbing or edema. Neurologic:  Alert and oriented x 4;  grossly normal neurologically. Skin:  Intact without significant lesions or rashes. Psych:  Alert and cooperative. Normal mood and affect.  IMPRESSION:  *Food impaction:  Beef stew and rice got stuck around 4 PM last evening.  Esophagram earlier this year did not show a stricture or mucosal abnormalities, but did show tortuosity and spasticity. *History of  CAD with MI on ASA 81 mg daily and Plavix at home. *Tachycardia:  Does have history of atrial tachycardia at home and has not been able to take his meds.  Still tachy in the ED.    PLAN: -EGD at some point today, TBD on timing, with MAC or moderate anesthesia, etc.  Likely MAC later this evening.  Xavier Fournier D. Kaylem Gidney  02/10/2020, 2:12 PM     

## 2020-02-10 NOTE — Anesthesia Procedure Notes (Signed)
Procedure Name: Intubation Date/Time: 02/10/2020 8:13 PM Performed by: Verdie Drown, CRNA Pre-anesthesia Checklist: Patient identified, Emergency Drugs available, Suction available and Patient being monitored Patient Re-evaluated:Patient Re-evaluated prior to induction Oxygen Delivery Method: Circle System Utilized Preoxygenation: Pre-oxygenation with 100% oxygen Induction Type: IV induction, Cricoid Pressure applied and Rapid sequence Laryngoscope Size: Mac and 3 Grade View: Grade I Tube type: Oral Tube size: 7.5 mm Number of attempts: 1 Airway Equipment and Method: Stylet and Oral airway Placement Confirmation: ETT inserted through vocal cords under direct vision,  positive ETCO2 and breath sounds checked- equal and bilateral Secured at: 23 cm Tube secured with: Tape Dental Injury: Teeth and Oropharynx as per pre-operative assessment

## 2020-02-10 NOTE — ED Notes (Addendum)
Pt left with wife due to not being seen quick enough and wife saying he was becoming too "hyper" and he needed to go even after tech urged them to stay to be seen.

## 2020-02-10 NOTE — ED Provider Notes (Signed)
Medical screening examination/treatment/procedure(s) were conducted as a shared visit with non-physician practitioner(s) and myself.  I personally evaluated the patient during the encounter. Briefly, the patient is a 84 y.o. male with history of atrial tachycardia, CAD, COPD who presents the ED with food impaction.  Was eaten beef stew yesterday around 4 PM follow-up something get stuck in the upper chest area and has not been able to tolerate fluids since.  He went to [facility] yesterday and was sent here but due to the wait in the waiting room patient left.  He continued to struggle with fluids at home and has come back today.  He has not been able to take any of his medications.  He has a history of atrial tachycardia and has not been able to take his medications.  In the room patient will go from a sinus rhythm to atrial tachycardia in the 130s.  We will give him a dose of IV Lopressor.  Will touch base with GI as concern for food impaction and will likely need endoscopy.  Suspect that heart rate will improve once fluid bolus is removed and that patient can take his oral medications.  Will likely need to be reevaluated in the ED following EGD to ensure that heart rate stabilized.  He does not endorse any chest pain or shortness of breath.  No signs of airway compromise.  EKG shows atrial tachycardia.  No ischemic changes.  Normal intervals otherwise.     Lennice Sites, DO 02/10/20 1324

## 2020-02-10 NOTE — Consult Note (Signed)
Referring Provider:  ED PA, Delia Heady Primary Care Physician:  Penelope Coop, FNP Primary Gastroenterologist:  Althia Forts  Reason for Consultation:  Food impaction  HPI: Chris Braun is a 84 y.o. male with a past medical history of atrial tachycardia, prior MI (on Plavix and ASA 81 mg daily), COPD, and multiple other medical problems as listed below.  He presented to the ED with a chief complaint of food bolus.  He was eating beef stew and rice last evening around 4 PM when food got stuck in his esophagus.  Apparently then went to [facility] and they did not have GI coverage so they were told to come here.  They waited in the ED waiting area but the patient became agitated and left.  He still could not swallow so they returned again today.  He is unable to swallow even a tsp of water.  He follows with Dr. Melina Copa of GI in [place].  He actually had an esophagram performed in April of this year that showed the following:  IMPRESSION: 1. Somewhat limited exam due to limited patient mobility. 2. Large sliding-type hiatal hernia. 3. Esophageal tortuosity and mild spasticity. 4. No mucosal abnormality, stricture or mass noted in the esophagus. 5. A 13 mm band tablet passed into the stomach easily.  He is tachycardic in the 130s-140s.  Receiving IV lopressor.  Past Medical History:  Diagnosis Date  . Acute hypoxemic respiratory failure ([place]) 05/27/2018  . AKI (acute kidney injury) ([place]) 05/27/2018  . Asthma   . Atrial tachycardia ([place]) 07/26/2018  . B12 deficiency   . CAD (coronary artery disease) 08/24/2014  . Carotid stenosis, bilateral 08/24/2014  . Cataracts, bilateral   . Compression fracture    BAck  . Compression fracture of L2 lumbar vertebra, closed, initial encounter ([place]) 10/27/2019  . COPD (chronic obstructive pulmonary disease) ([place])   . Depression   . Diverticulosis    per colonscopy  . Dyslipidemia 08/24/2014  . Falls frequently 06/03/2015  . Gait abnormality 01/19/2018   . GERD (gastroesophageal reflux disease)   . Hiatal hernia   . High cholesterol   . Hypertension   . Hypoxia   . Idiopathic progressive polyneuropathy   . Kidney stones   . Lumbar vertebral fracture, pathologic 10/27/2019  . Macular degeneration   . Memory disorder 01/19/2018  . Myocardial infarction ([place]) 2002  . 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 Osteoporosis   . Right kidney stone 05/27/2018  . Right upper lobe pneumonia 05/27/2018  . Risk for falls 05/28/2015  . Status post ablation of ventricular arrhythmia 04/27/2018  . 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 Stroke ([place])   . SVT (supraventricular tachycardia) ([place]) 12/09/2019  . Weakness of distal arms and legs 06/03/2015    Past Surgical History:  Procedure Laterality Date  . CARDIAC CATHETERIZATION  2002,   stents   . CATARACT EXTRACTION Left 2016  . Colonscopy  2012  . CYSTOSCOPY W/ URETERAL STENT PLACEMENT Right 05/30/2018   Procedure: CYSTOSCOPY WITH RETROGRADE PYELOGRAM/URETERAL STENT PLACEMENT;  Surgeon: Irine Seal, MD;  Location: [place];  Service: Urology;  Laterality: Right;  . HIP FRACTURE SURGERY  2008   right  . HYDROCELE EXCISION  10/12/2014  . LITHOTRIPSY     2007, 2009, 2010, 2011, 2012, 2013  . LUMBAR LAMINECTOMY/DECOMPRESSION MICRODISCECTOMY  03/15/2012   Procedure: LUMBAR LAMINECTOMY/DECOMPRESSION MICRODISCECTOMY 1 LEVEL;  Surgeon: Elaina Hoops, MD;  Location: [place] [facility];  Service: Neurosurgery;  Laterality: Left;  Left lumbar three-four decompressive lumbar laminectomy, discectomy  . MAXIMUM ACCESS (MAS)POSTERIOR LUMBAR INTERBODY FUSION (PLIF) 2 LEVEL N/A 12/08/2012   Procedure: FOR MAXIMUM ACCESS (MAS) POSTERIOR LUMBAR INTERBODY FUSION (PLIF) 2 LEVEL;  Surgeon: Elaina Hoops, MD;  Location: [place] [facility];  Service: Neurosurgery;  Laterality: N/A;  FOR MAXIMUM ACCESS (MAS) POSTERIOR LUMBAR INTERBODY FUSION (PLIF) 2 LEVEL  . ROTATOR CUFF REPAIR  1999   bil   . [place]  .  TOTAL HIP ARTHROPLASTY Right 2008  . VT study     with Ablation    Prior to Admission medications   Medication Sig Start Date End Date Taking? Authorizing Provider  Acetaminophen 325 MG CAPS Take 975 mg by mouth every 8 (eight) hours as needed (pain).    Yes [provider]  albuterol (PROVENTIL HFA;VENTOLIN HFA) 108 (90 BASE) MCG/ACT inhaler Inhale 2 puffs into the lungs every 6 (six) hours as needed for wheezing.   Yes [provider]  aspirin EC 81 MG tablet Take 81 mg by mouth at bedtime.    Yes [provider]  atorvastatin (LIPITOR) 80 MG tablet Take 80 mg by mouth at bedtime.    Yes [provider]  benzonatate (TESSALON) 200 MG capsule Take 200 mg by mouth in the morning, at noon, and at bedtime.  11/19/19  Yes [provider]  calcitonin, salmon, (MIACALCIN/FORTICAL) 200 UNIT/ACT nasal spray Place 1 spray into alternate nostrils at bedtime.    Yes [provider]  Cholecalciferol (VITAMIN D) 50 MCG (2000 UT) CAPS Take by mouth.   Yes [provider]  clopidogrel (PLAVIX) 75 MG tablet Take 75 mg by mouth daily. 06/20/15  Yes [provider]  digoxin (LANOXIN) 0.125 MG tablet Take 1 tablet (0.125 mg total) by mouth every other day. 12/23/19  Yes Revankar, Reita Cliche, MD  donepezil (ARICEPT) 5 MG tablet Take 5 mg by mouth at bedtime.  11/05/15  Yes [provider]  Ferrous Gluconate (IRON 27 PO) Take 27 mg by mouth every evening.   Yes [provider]  guaiFENesin (MUCINEX) 600 MG 12 hr tablet Take 1,200 mg by mouth 2 (two) times daily.    Yes [provider]  ipratropium-albuterol (DUONEB) 0.5-2.5 (3) MG/3ML SOLN Take 3 mLs by nebulization 2 (two) times daily.    Yes [provider]  ketoconazole (NIZORAL) 2 % cream Apply 1 application topically daily as needed for irritation (on feet). Apply as needed to affected area.   04/25/19  Yes [provider]  lactulose (CHRONULAC) 10  GM/15ML solution Take 10 g by mouth at bedtime as needed for mild constipation.    Yes [provider]  loperamide (IMODIUM) 2 MG capsule Take 2-4 mg by mouth as needed for diarrhea or loose stools.   Yes [provider]  loratadine (CLARITIN) 10 MG tablet Take 10 mg by mouth in the morning.    Yes [provider]  metoprolol succinate (TOPROL-XL) 25 MG 24 hr tablet Take 1 tablet (25 mg total) by mouth daily. 12/11/19 01/10/20 Yes Alexandria Lodge, MD  Misc Natural Products (LUTEIN 20) CAPS Take 20 mg by mouth in the morning.   Yes [provider]  montelukast (SINGULAIR) 10 MG tablet Take 10 mg by mouth daily.    Yes [provider]  Multiple Vitamins-Minerals (MULTIVITAMIN PO) Take 1 tablet by mouth every evening.    Yes [provider]  nitroGLYCERIN (NITROSTAT) 0.4 MG SL  tablet DISSOLVE 1 TABLET UNDER THE TONGUE EVERY 5 MINUTES AS NEEDED FOR CHEST PAIN. DO NOT EXCEED A TOTAL OF 3 DOSES IN 15 MINUTES. Patient taking differently: Place 0.4 mg under the tongue every 5 (five) minutes x 3 doses as needed for chest pain. 06/07/18  Yes Revankar, Reita Cliche, MD  ondansetron (ZOFRAN-ODT) 4 MG disintegrating tablet Take 4 mg by mouth every 8 (eight) hours as needed for nausea or vomiting.  12/07/19  Yes [provider]  pantoprazole (PROTONIX) 40 MG tablet Take 40 mg by mouth daily before breakfast.    Yes [provider]  potassium chloride (KLOR-CON) 10 MEQ tablet Take 10 mEq by mouth daily as needed (take with lasix).   Yes [provider]  rOPINIRole (REQUIP) 0.5 MG tablet Take 0.5 mg by mouth in the morning, at noon, and at bedtime.  09/27/16  Yes [provider]  SYMBICORT 160-4.5 MCG/ACT inhaler Inhale 2 puffs into the lungs 2 (two) times daily. 07/21/16  Yes [provider]  tamsulosin (FLOMAX) 0.4 MG CAPS capsule Take 0.4 mg by mouth daily after supper.   Yes [provider]  traMADol (ULTRAM) 50 MG  tablet Take 50 mg by mouth every 6 (six) hours as needed for moderate pain.  11/23/19  Yes [provider]  verapamil (CALAN) 40 MG tablet Take one tablet twice daily as needed for heart rate greater than 90. 12/14/19  Yes Revankar, Reita Cliche, MD  vitamin B-12 (CYANOCOBALAMIN) 1000 MCG tablet Take 2,000 mcg by mouth daily.   Yes [provider]  Calcium Carb-Cholecalciferol (CALCIUM-VITAMIN D3) 600-200 MG-UNIT TABS Take 600 mg by mouth 2 (two) times daily.    [provider]  methocarbamol (ROBAXIN) 500 MG tablet Take 1 tablet (500 mg total) by mouth 3 (three) times daily. Patient not taking: Reported on 02/10/2020 10/31/19   Geradine Girt, DO  polyethylene glycol (MIRALAX / GLYCOLAX) 17 g packet Take 17 g by mouth daily. Patient not taking: Reported on 02/10/2020 05/31/18   Aline August, MD    Current Facility-Administered Medications  Medication Dose Route Frequency Provider Last Rate Last Admin  . glucagon (human recombinant) (GLUCAGEN) injection 1 mg  1 mg Intramuscular Once Khatri, Hina, PA-C      . metoprolol tartrate (LOPRESSOR) injection 5 mg  5 mg Intravenous Once Khatri, Hina, PA-C       Current Outpatient Medications  Medication Sig Dispense Refill  . Acetaminophen 325 MG CAPS Take 975 mg by mouth every 8 (eight) hours as needed (pain).     Marland Kitchen albuterol (PROVENTIL HFA;VENTOLIN HFA) 108 (90 BASE) MCG/ACT inhaler Inhale 2 puffs into the lungs every 6 (six) hours as needed for wheezing.    Marland Kitchen aspirin EC 81 MG tablet Take 81 mg by mouth at bedtime.     Marland Kitchen atorvastatin (LIPITOR) 80 MG tablet Take 80 mg by mouth at bedtime.     . benzonatate (TESSALON) 200 MG capsule Take 200 mg by mouth in the morning, at noon, and at bedtime.     . calcitonin, salmon, (MIACALCIN/FORTICAL) 200 UNIT/ACT nasal spray Place 1 spray into alternate nostrils at bedtime.     . Calcium Carb-Cholecalciferol (CALCIUM-VITAMIN D3) 600-200 MG-UNIT TABS Take 600 mg by mouth 2 (two) times daily.     . Cholecalciferol (VITAMIN D) 50 MCG (2000 UT) CAPS Take by mouth.    . clopidogrel (PLAVIX) 75 MG tablet Take 75 mg by mouth daily.    . digoxin (LANOXIN) 0.125 MG tablet  Take 1 tablet (0.125 mg total) by mouth every other day. 30 tablet 3  . donepezil (ARICEPT) 5 MG tablet Take 5 mg by mouth at bedtime.     . Ferrous Gluconate (IRON 27 PO) Take 27 mg by mouth every evening.    Marland Kitchen guaiFENesin (MUCINEX) 600 MG 12 hr tablet Take 1,200 mg by mouth 2 (two) times daily.     Marland Kitchen ipratropium-albuterol (DUONEB) 0.5-2.5 (3) MG/3ML SOLN Take 3 mLs by nebulization 2 (two) times daily.     Marland Kitchen ketoconazole (NIZORAL) 2 % cream Apply 1 application topically daily as needed for irritation (on feet). Apply as needed to affected area.      . lactulose (CHRONULAC) 10 GM/15ML solution Take 10 g by mouth at bedtime as needed for mild constipation.     Marland Kitchen loperamide (IMODIUM) 2 MG capsule Take 2-4 mg by mouth as needed for diarrhea or loose stools.    Marland Kitchen loratadine (CLARITIN) 10 MG tablet Take 10 mg by mouth in the morning.     . metoprolol succinate (TOPROL-XL) 25 MG 24 hr tablet Take 1 tablet (25 mg total) by mouth daily. 30 tablet 0  . Misc Natural Products (LUTEIN 20) CAPS Take 20 mg by mouth in the morning.    . montelukast (SINGULAIR) 10 MG tablet Take 10 mg by mouth daily.     . Multiple Vitamins-Minerals (MULTIVITAMIN PO) Take 1 tablet by mouth every evening.     . nitroGLYCERIN (NITROSTAT) 0.4 MG SL tablet DISSOLVE 1 TABLET UNDER THE TONGUE EVERY 5 MINUTES AS NEEDED FOR CHEST PAIN. DO NOT EXCEED A TOTAL OF 3 DOSES IN 15 MINUTES. (Patient taking differently: Place 0.4 mg under the tongue every 5 (five) minutes x 3 doses as needed for chest pain.) 25 tablet 6  . ondansetron (ZOFRAN-ODT) 4 MG disintegrating tablet Take 4 mg by mouth every 8 (eight) hours as needed for nausea or vomiting.     . pantoprazole (PROTONIX) 40 MG tablet Take 40 mg by mouth daily before breakfast.     . potassium chloride (KLOR-CON) 10 MEQ  tablet Take 10 mEq by mouth daily as needed (take with lasix).    Marland Kitchen rOPINIRole (REQUIP) 0.5 MG tablet Take 0.5 mg by mouth in the morning, at noon, and at bedtime.   3  . SYMBICORT 160-4.5 MCG/ACT inhaler Inhale 2 puffs into the lungs 2 (two) times daily.  12  . tamsulosin (FLOMAX) 0.4 MG CAPS capsule Take 0.4 mg by mouth daily after supper.    . traMADol (ULTRAM) 50 MG tablet Take 50 mg by mouth every 6 (six) hours as needed for moderate pain.     . verapamil (CALAN) 40 MG tablet Take one tablet twice daily as needed for heart rate greater than 90. 60 tablet 3  . vitamin B-12 (CYANOCOBALAMIN) 1000 MCG tablet Take 2,000 mcg by mouth daily.    . methocarbamol (ROBAXIN) 500 MG tablet Take 1 tablet (500 mg total) by mouth 3 (three) times daily. (Patient not taking: Reported on 02/10/2020)    . polyethylene glycol (MIRALAX / GLYCOLAX) 17 g packet Take 17 g by mouth daily. (Patient not taking: Reported on 02/10/2020)      Allergies as of 02/10/2020 - Review Complete 02/10/2020  Allergen Reaction Noted  . Other Nausea And Vomiting and Other (See Comments) 10/07/2017  . Pneumovax [pneumococcal polysaccharide vaccine] Nausea And Vomiting and Other (See Comments) 10/27/2019  . Ibuprofen Other (See Comments) 10/07/2017  . Norco [hydrocodone-acetaminophen]  10/27/2019  .  Oxycodone Other (See Comments) 09/26/2013    Family History  Problem Relation Age of Onset  . Hemolytic uremic syndrome Mother   . Heart disease Mother   . Stroke Father   . Heart disease Father   . Heart disease Sister   . Heart disease Sister   . Heart disease Sister   . Alzheimer's disease Sister     Social History   Socioeconomic History  . Marital status: Married    Spouse name: Ryne Mctigue  . Number of children: 4  . Years of education: 93  . Highest education level: Not on file  Occupational History  . Occupation: Retired  Tobacco Use  . Smoking status: Former Smoker    Packs/day: 1.00    Years: 45.00     Pack years: 45.00    Types: Cigarettes    Quit date: 02/02/2001    Years since quitting: 19.0  . Smokeless tobacco: Current User    Types: Chew  Vaping Use  . Vaping Use: Never used  Substance and Sexual Activity  . Alcohol use: No  . Drug use: No  . Sexual activity: Not on file  Other Topics Concern  . Not on file  Social History Narrative   Lives with wife   Caffeine use: coffee, tea   Social Determinants of Health   Financial Resource Strain: Not on file  Food Insecurity: Not on file  Transportation Needs: Not on file  Physical Activity: Not on file  Stress: Not on file  Social Connections: Not on file  Intimate Partner Violence: Not on file    Review of Systems: ROS is O/W negative except as mentioned in HPI.  Physical Exam: Vital signs in last 24 hours: Temp:  [98 F (36.7 C)-98.6 F (37 C)] 98.6 F (37 C) (12/24 1245) Pulse Rate:  [75-141] 136 (12/24 1400) Resp:  [18-25] 25 (12/24 1400) BP: (116-146)/(74-96) 142/96 (12/24 1400) SpO2:  [90 %-95 %] 92 % (12/24 1400) Weight:  [70.9 kg] 70.9 kg (12/24 1249)   General:  Alert, frail, pleasant and cooperative in NAD Head:  Normocephalic and atraumatic. Eyes:  Sclera clear, no icterus.  Conjunctiva pink. Ears:  HOH. Mouth:  No deformity or lesions.   Lungs:  Clear throughout to auscultation.  No wheezes, crackles, or rhonchi.  Heart:  Tachycardic. Abdomen:  Soft, non-distended.  BS present.  Non-tender.  Msk:  Symmetrical without gross deformities. Pulses:  Normal pulses noted. Extremities:  Without clubbing or edema. Neurologic:  Alert and oriented x 4;  grossly normal neurologically. Skin:  Intact without significant lesions or rashes. Psych:  Alert and cooperative. Normal mood and affect.  IMPRESSION:  *Food impaction:  Beef stew and rice got stuck around 4 PM last evening.  Esophagram earlier this year did not show a stricture or mucosal abnormalities, but did show tortuosity and spasticity. *History of  CAD with MI on ASA 81 mg daily and Plavix at home. *Tachycardia:  Does have history of atrial tachycardia at home and has not been able to take his meds.  Still tachy in the ED.    PLAN: -EGD at some point today, TBD on timing, with MAC or moderate anesthesia, etc.  Likely MAC later this evening.  Laban Emperor. Briya Lookabaugh  02/10/2020, 2:12 PM

## 2020-02-10 NOTE — Op Note (Signed)
[facility] Patient Name: Chris Braun Procedure Date : 02/10/2020 MRN: [id number] Attending MD: [name]. Havery Moros , MD Date of Birth: [birth year] CSN: [id number] Age: 84 Admit Type: Emergency Department Procedure:                Upper GI endoscopy Indications:              Foreign body in the esophagus - food impaction                            occured 4 PM yesterday, anesthesia assisted for                            MAC, patient electively intubated prior to                            procedure for airway protection - food in upper                            esophagus posterior pharynx, anesthesia concerned                            for aspiration prior to intubation. Patient also                            with poorly controlled atrial tachycardia, could                            not take home lopressor / verapamil Providers:                Remo Lipps P. Havery Moros, MD, Benay Pillow, RN, Cherylynn Ridges, Technician Referring MD:              Medicines:                Monitored Anesthesia Care Complications:            No immediate complications. Estimated blood loss:                            Minimal. Estimated Blood Loss:     Estimated blood loss was minimal. Procedure:                Pre-Anesthesia Assessment:                           - Prior to the procedure, a History and Physical                            was performed, and patient medications and                            allergies were reviewed. The patient's tolerance of  previous anesthesia was also reviewed. The risks                            and benefits of the procedure and the sedation                            options and risks were discussed with the patient.                            All questions were answered, and informed consent                            was obtained. Prior Anticoagulants: The patient has                             taken Plavix (clopidogrel), last dose was 1 day                            prior to procedure. ASA Grade Assessment: III - A                            patient with severe systemic disease. After                            reviewing the risks and benefits, the patient was                            deemed in satisfactory condition to undergo the                            procedure.                           After obtaining informed consent, the endoscope was                            passed under direct vision. Throughout the                            procedure, the patient's blood pressure, pulse, and                            oxygen saturations were monitored continuously. The                            GIF-H190 ([id number]) Olympus gastroscope was                            introduced through the mouth, and advanced to the                            second part of duodenum. The patient tolerated the  procedure well. The upper GI endoscopy was                            technically difficult and complex. The patient                            tolerated the procedure well. Scope In: Scope Out: Findings:      Meat was found in the upper third of the esophagus and posterior       pharynx. Removal of food was technically quite challenging. There was a       lot of edema at the site of impaction and meat could not be pushed       through. Banding cap was used to remove a large bolus from the proximal       esophagus at 15cm from the incisors. At that point the lumen of the       esophagus was patent but there was a significant amount of residual meat       impacted in the UES and posterior pharynx. This was removed tediously       with forceps until entirely cleared. Anesthesia evaluated the posterior       pharynx prior to extubation and could not visualize any residual food.       The site of impaction was quite edematous and tissue was ulcerated       friable  from duration of impaction. Site of impaction 15cm from the       incisors. Hard to assess degree of stenosis (if any) due to edema.       Impaction could have been due to just large bolus. No ulcerations or       mucosal wrents noted. The posterior pharynx had superficial mucosal       trauma from suctioning.      Esophagogastric landmarks were identified: the Z-line was found at 30       cm, the gastroesophageal junction was found at 30 cm and the upper       extent of the gastric folds was found at 37 cm from the incisors.      A 7 cm hiatal hernia was present.      The examined esophagus was extremely tortuous.      The exam was otherwise without abnormality.      The entire examined stomach was normal.      The duodenal bulb and second portion of the duodenum were normal. Impression:               - Food impaction at 15cm from the incisors                            extending up into the UES and posterior pharynx.                            Removal was successful as above. Concern for                            aspiration pre-procedure despite elective                            intubation.                           -  Esophagogastric landmarks identified.                           - 7 cm hiatal hernia.                           - Tortuous esophagus.                           - The examination was otherwise normal.                           - Normal stomach.                           - Normal duodenal bulb and second portion of the                            duodenum. Recommendation:           - Admit the patient to hospital ward for                            observation given concern for possible aspiration                            and poorly controlled atrial tachycardia - rates                            ranged from 70s to 140s. Hospitalist was notified                            and will evaluate the patient for observation status                           - Clear liquid diet  tonight                           - Continue present medications.                           - Continue home protonix                           - Start liquid carafate 10cc po q 6 hours                           - Patient to follow up with primary GI physician                            for further care Procedure Code(s):        --- Professional ---                           [phone], Esophagogastroduodenoscopy, flexible,  transoral; with removal of foreign body(s) Diagnosis Code(s):        --- Professional ---                           H43.276D, Food in esophagus causing other injury,                            initial encounter                           K44.9, Diaphragmatic hernia without obstruction or                            gangrene                           Q39.9, Congenital malformation of esophagus,                            unspecified                           T18.108A, Unspecified foreign body in esophagus                            causing other injury, initial encounter CPT copyright 2019 [employer]. All rights reserved. The codes documented in this report are preliminary and upon coder review may  be revised to meet current compliance requirements. Remo Lipps P. Jayleene Glaeser, MD 02/10/2020 9:17:24 PM This report has been signed electronically. Number of Addenda: 0

## 2020-02-11 ENCOUNTER — Encounter (HOSPITAL_COMMUNITY): Payer: Self-pay | Admitting: Gastroenterology

## 2020-02-11 DIAGNOSIS — R413 Other amnesia: Secondary | ICD-10-CM

## 2020-02-11 DIAGNOSIS — R059 Cough, unspecified: Secondary | ICD-10-CM | POA: Diagnosis not present

## 2020-02-11 DIAGNOSIS — Z79899 Other long term (current) drug therapy: Secondary | ICD-10-CM | POA: Diagnosis not present

## 2020-02-11 DIAGNOSIS — I10 Essential (primary) hypertension: Secondary | ICD-10-CM | POA: Diagnosis present

## 2020-02-11 DIAGNOSIS — E785 Hyperlipidemia, unspecified: Secondary | ICD-10-CM | POA: Diagnosis present

## 2020-02-11 DIAGNOSIS — J9601 Acute respiratory failure with hypoxia: Secondary | ICD-10-CM | POA: Diagnosis present

## 2020-02-11 DIAGNOSIS — Q399 Congenital malformation of esophagus, unspecified: Secondary | ICD-10-CM

## 2020-02-11 DIAGNOSIS — Z8673 Personal history of transient ischemic attack (TIA), and cerebral infarction without residual deficits: Secondary | ICD-10-CM | POA: Diagnosis not present

## 2020-02-11 DIAGNOSIS — I471 Supraventricular tachycardia: Secondary | ICD-10-CM | POA: Diagnosis not present

## 2020-02-11 DIAGNOSIS — D696 Thrombocytopenia, unspecified: Secondary | ICD-10-CM | POA: Diagnosis not present

## 2020-02-11 DIAGNOSIS — Z8679 Personal history of other diseases of the circulatory system: Secondary | ICD-10-CM | POA: Diagnosis not present

## 2020-02-11 DIAGNOSIS — Z87442 Personal history of urinary calculi: Secondary | ICD-10-CM | POA: Diagnosis not present

## 2020-02-11 DIAGNOSIS — T17908A Unspecified foreign body in respiratory tract, part unspecified causing other injury, initial encounter: Secondary | ICD-10-CM | POA: Diagnosis not present

## 2020-02-11 DIAGNOSIS — J690 Pneumonitis due to inhalation of food and vomit: Secondary | ICD-10-CM | POA: Diagnosis not present

## 2020-02-11 DIAGNOSIS — K56699 Other intestinal obstruction unspecified as to partial versus complete obstruction: Secondary | ICD-10-CM | POA: Diagnosis not present

## 2020-02-11 DIAGNOSIS — M8448XA Pathological fracture, other site, initial encounter for fracture: Secondary | ICD-10-CM | POA: Diagnosis not present

## 2020-02-11 DIAGNOSIS — Z955 Presence of coronary angioplasty implant and graft: Secondary | ICD-10-CM | POA: Diagnosis not present

## 2020-02-11 DIAGNOSIS — Z9889 Other specified postprocedural states: Secondary | ICD-10-CM | POA: Diagnosis not present

## 2020-02-11 DIAGNOSIS — T18128A Food in esophagus causing other injury, initial encounter: Secondary | ICD-10-CM | POA: Diagnosis present

## 2020-02-11 DIAGNOSIS — F0390 Unspecified dementia without behavioral disturbance: Secondary | ICD-10-CM | POA: Diagnosis present

## 2020-02-11 DIAGNOSIS — J449 Chronic obstructive pulmonary disease, unspecified: Secondary | ICD-10-CM | POA: Diagnosis present

## 2020-02-11 DIAGNOSIS — M810 Age-related osteoporosis without current pathological fracture: Secondary | ICD-10-CM | POA: Diagnosis present

## 2020-02-11 DIAGNOSIS — J9811 Atelectasis: Secondary | ICD-10-CM | POA: Diagnosis not present

## 2020-02-11 DIAGNOSIS — N400 Enlarged prostate without lower urinary tract symptoms: Secondary | ICD-10-CM | POA: Diagnosis present

## 2020-02-11 DIAGNOSIS — I480 Paroxysmal atrial fibrillation: Secondary | ICD-10-CM | POA: Diagnosis present

## 2020-02-11 DIAGNOSIS — Z20822 Contact with and (suspected) exposure to covid-19: Secondary | ICD-10-CM | POA: Diagnosis present

## 2020-02-11 DIAGNOSIS — I2510 Atherosclerotic heart disease of native coronary artery without angina pectoris: Secondary | ICD-10-CM | POA: Diagnosis present

## 2020-02-11 DIAGNOSIS — Z96641 Presence of right artificial hip joint: Secondary | ICD-10-CM | POA: Diagnosis present

## 2020-02-11 DIAGNOSIS — H9190 Unspecified hearing loss, unspecified ear: Secondary | ICD-10-CM | POA: Diagnosis present

## 2020-02-11 DIAGNOSIS — G2581 Restless legs syndrome: Secondary | ICD-10-CM | POA: Diagnosis not present

## 2020-02-11 DIAGNOSIS — K449 Diaphragmatic hernia without obstruction or gangrene: Secondary | ICD-10-CM | POA: Diagnosis present

## 2020-02-11 DIAGNOSIS — I252 Old myocardial infarction: Secondary | ICD-10-CM | POA: Diagnosis not present

## 2020-02-11 LAB — BASIC METABOLIC PANEL
Anion gap: 13 (ref 5–15)
BUN: 13 mg/dL (ref 8–23)
CO2: 25 mmol/L (ref 22–32)
Calcium: 8.3 mg/dL — ABNORMAL LOW (ref 8.9–10.3)
Chloride: 103 mmol/L (ref 98–111)
Creatinine, Ser: 0.97 mg/dL (ref 0.61–1.24)
GFR, Estimated: >60 mL/min (ref >60)
Glucose, Bld: 92 mg/dL (ref 70–99)
Potassium: 4.1 mmol/L (ref 3.5–5.1)
Sodium: 141 mmol/L (ref 135–145)

## 2020-02-11 LAB — CBC
HCT: 38 % — ABNORMAL LOW (ref 39.0–52.0)
Hemoglobin: 13.4 g/dL (ref 13.0–17.0)
MCH: 31.8 pg (ref 26.0–34.0)
MCHC: 35.3 g/dL (ref 30.0–36.0)
MCV: 90 fL (ref 80.0–100.0)
Platelets: 143 10*3/uL — ABNORMAL LOW (ref 150–400)
RBC: 4.22 MIL/uL (ref 4.22–5.81)
RDW: 14 % (ref 11.5–15.5)
WBC: 12.6 10*3/uL — ABNORMAL HIGH (ref 4.0–10.5)
nRBC: 0 % (ref 0.0–0.2)

## 2020-02-11 LAB — TROPONIN I (HIGH SENSITIVITY)
Troponin I (High Sensitivity): 22 ng/L — ABNORMAL HIGH (ref <18)
Troponin I (High Sensitivity): 21 ng/L — ABNORMAL HIGH (ref <18)

## 2020-02-11 LAB — TSH: TSH: 0.632 u[IU]/mL (ref 0.350–4.500)

## 2020-02-11 LAB — DIGOXIN LEVEL: Digoxin Level: 0.6 ng/mL — ABNORMAL LOW (ref 0.8–2.0)

## 2020-02-11 LAB — LACTIC ACID, PLASMA
Lactic Acid, Venous: 0.7 mmol/L (ref 0.5–1.9)
Lactic Acid, Venous: 0.7 mmol/L (ref 0.5–1.9)

## 2020-02-11 MED ORDER — SODIUM CHLORIDE 0.9 % IV SOLN
3.0000 g | Freq: Four times a day (QID) | INTRAVENOUS | Status: DC
  Administered 2020-02-11 – 2020-02-16 (×23): 3 g via INTRAVENOUS
  Filled 2020-02-11 (×4): qty 3
  Filled 2020-02-11 (×2): qty 8
  Filled 2020-02-11 (×3): qty 3
  Filled 2020-02-11: qty 8
  Filled 2020-02-11: qty 3
  Filled 2020-02-11: qty 8
  Filled 2020-02-11 (×3): qty 3
  Filled 2020-02-11 (×2): qty 8
  Filled 2020-02-11 (×8): qty 3

## 2020-02-11 MED ORDER — SODIUM CHLORIDE 0.9 % IV BOLUS
500.0000 mL | Freq: Once | INTRAVENOUS | Status: AC
  Administered 2020-02-11: 22:00:00 500 mL via INTRAVENOUS

## 2020-02-11 MED ORDER — METOPROLOL TARTRATE 5 MG/5ML IV SOLN: 5.0000 mg | Freq: Once | INTRAVENOUS | Status: DC

## 2020-02-11 MED ORDER — SODIUM CHLORIDE 0.9 % IV BOLUS
250.0000 mL | Freq: Once | INTRAVENOUS | Status: AC
  Administered 2020-02-11: 06:00:00 250 mL via INTRAVENOUS

## 2020-02-11 NOTE — Anesthesia Postprocedure Evaluation (Signed)
Anesthesia Post Note  Patient: Chris Braun  Procedure(s) Performed: ESOPHAGOGASTRODUODENOSCOPY (EGD) WITH PROPOFOL (N/A ) FOREIGN BODY REMOVAL     Patient location during evaluation: PACU Anesthesia Type: General Level of consciousness: awake and alert Pain management: pain level controlled Vital Signs Assessment: post-procedure vital signs reviewed and stable Respiratory status: spontaneous breathing, nonlabored ventilation, respiratory function stable and patient connected to nasal cannula oxygen Cardiovascular status: blood pressure returned to baseline and stable Postop Assessment: no apparent nausea or vomiting Anesthetic complications: no   No complications documented.  Last Vitals:  Vitals:   02/11/20 0510 02/11/20 0605  BP: 101/80 101/64  Pulse: (!) 120 (!) 58  Resp: 20 14  Temp:  36.7 C  SpO2: 95% 97%    Last Pain:  Vitals:   02/11/20 0605  TempSrc: Oral  PainSc:      Pt stable. In light of arrythmias and possible aspiration preanesthetic, I feel he needs to be observed overnight. [facility] to see, CXR ordered.            [place]

## 2020-02-11 NOTE — Progress Notes (Signed)
Patient HR went up to 125 and sustained for some time. BP was taken throughout and dropped to 80s/60s. Other vitals showed no change. Physician was notified and started on a 500 mL fluid bolus. Patient was alert and oriented throughout. At 1135 am the patient converted to sinus rhythm with HR in the 60s. BP with improvement at 95/57. Will continue to monitor.   -Donnelly Angelica, RN

## 2020-02-11 NOTE — Progress Notes (Signed)
Pt's heart rate was about 120 for an hour. His BP was 101/80.  Temp of 99.0 F.  Pt is alert and easy to wake up.  Informed Dr. Hal Hope and he ordered a one time 250 cc IV bolus and a one time IV metoprolol 5 mg to give after the bolus is complete.  Will continue to monitor.  Lupita Dawn, RN

## 2020-02-11 NOTE — Progress Notes (Signed)
Pharmacy Antibiotic Note  Chris Braun is a 84 y.o. male admitted on 02/10/2020 with aspiration pneumonia.  Pharmacy has been consulted for Unasyn dosing.  Plan: Unasyn 3gm IV q6h Will f/u renal function, micro data, and pt's clinical condition  Height: 5\' 5"  (165.1 cm) Weight: 68.8 kg (151 lb 10.8 oz) IBW/kg (Calculated) : 61.5  Temp (24hrs), Avg:98.4 F (36.9 C), Min:97.7 F (36.5 C), Max:99 F (37.2 C)  Recent Labs  Lab 02/10/20 1345 02/11/20 0115  WBC 11.6* 12.6*  CREATININE 0.99 0.97    Estimated Creatinine Clearance: 51.1 mL/min (by C-G formula based on SCr of 0.97 mg/dL).    Allergies  Allergen Reactions  . Other Nausea And Vomiting and Other (See Comments)    pneumonia vaccine- chills, vomiting, fever, lost use of legs and body function. Had a fall post inj. (05/25/2013)  . Pneumovax [Pneumococcal Polysaccharide Vaccine] Nausea And Vomiting and Other (See Comments)    Chills, loss of bodily functions, ended up in the ED  . Ibuprofen Other (See Comments)    Heart doctor advised he cannot take this  . Norco [Hydrocodone-Acetaminophen]     Must have Zofran to tolerate this  . Oxycodone Other (See Comments)    Mental status changes and causes sleepwalking    Antimicrobials this admission: 12/25 Unasyn >>   Thank you for allowing pharmacy to be a part of this patient's care.  Sherlon Handing, PharmD, BCPS Please see amion for complete clinical pharmacist phone list 02/11/2020 5:15 AM

## 2020-02-11 NOTE — Progress Notes (Signed)
Progress Note   Subjective  Patient doing well this AM. He is swallowing well without any trouble. Throat is somewhat sore.  He denies any chest pain or shortness of breath but has a slight cough.  His heart rate has fluctuated overnight, currently in the 130s at bedside.   Objective   Vital signs in last 24 hours: Temp:  [97.7 F (36.5 C)-99 F (37.2 C)] 98.1 F (36.7 C) (12/25 0605) Pulse Rate:  [58-141] 100 (12/25 0839) Resp:  [14-29] 14 (12/25 0605) BP: (97-148)/(64-121) 105/75 (12/25 0839) SpO2:  [90 %-97 %] 97 % (12/25 0605) Weight:  [68.8 kg-70.9 kg] 68.8 kg (12/24 2221) Last BM Date:  (Unknown) General:    white male in NAD Heart:  tachycardic Abdomen:  Soft, nontender and nondistended. . Extremities:  Without edema. Neurologic:  Alert and oriented,  grossly normal neurologically. Psych:  Cooperative. Normal mood and affect.  Intake/Output from previous day: 12/24 0701 - 12/25 0700 In: 600 [P.O.:100; I.V.:500] Out: 200 [Urine:200] Intake/Output this shift: No intake/output data recorded.  Lab Results: Recent Labs    02/10/20 1345 02/11/20 0115  WBC 11.6* 12.6*  HGB 15.2 13.4  HCT 44.0 38.0*  PLT 164 143*   BMET Recent Labs    02/10/20 1345 02/11/20 0115  NA 142 141  K 3.7 4.1  CL 102 103  CO2 27 25  GLUCOSE 95 92  BUN 14 13  CREATININE 0.99 0.97  CALCIUM 9.2 8.3*   LFT No results for input(s): PROT, ALBUMIN, AST, ALT, ALKPHOS, BILITOT, BILIDIR, IBILI in the last 72 hours. PT/INR No results for input(s): LABPROT, INR in the last 72 hours.  Studies/Results: DG CHEST PORT 1 VIEW  Result Date: 02/10/2020 CLINICAL DATA:  Aspiration into airway. EXAM: PORTABLE CHEST 1 VIEW COMPARISON:  January 11, 2020 FINDINGS: Decreased lung volumes are seen, likely secondary to the degree of patient inspiration. Moderate severity diffuse chronic appearing increased interstitial lung markings are seen. Mild atelectasis and/or infiltrate is noted within  the left lung base. There is no evidence of a pleural effusion or pneumothorax. The heart size and mediastinal contours are within normal limits. Marked severity calcification of the aortic arch is seen. There is moderate severity dextroscoliosis of the thoracic spine with evidence of prior vertebroplasty seen within the mid and lower thoracic spine. IMPRESSION: Chronic appearing increased interstitial lung markings with mild left basilar atelectasis and/or infiltrate. Electronically Signed   By: Aram Candelahaddeus  Houston M.D.   On: 02/10/2020 21:23       Assessment / Plan:    84 year old male on aspirin and Plavix, COPD, chronic dysphagia, presented with a food impaction yesterday.  By the time he presented to the hospital it was more than 24 hours since the impaction.  He had an endoscopy performed last night with anesthesia support, the patient was intubated before the case started and anesthesia was concerned that was evidence of aspiration already.  He had a very large bolus of meat stuck at the upper esophagus/UES/posterior pharynx which was challenging to remove but successful.  Due to the food impaction the patient was not able to take his normal medications and he had atrial tachycardia with heart rates in the 130s 140s yesterday.  Given his heart rate and concern for possible aspiration he was admitted to the hospital.  He is tolerating liquids today and generally feeling pretty well.  He wants to go home.  Unfortunately his heart rate remains elevated.  White blood cell count  slightly elevated, given Unasyn overnight for possible aspiration.  Recommend: - Full liquid diet today.  Would do soft diet tomorrow as tolerated.  Would not put on regular diet for another several days and he should completely avoid eating meat or raw vegetables until he has follow-up with his primary GI physician.  He had significant edema at the site of impaction, unable to appreciate clear stenosis given findings.  His  esophagus was otherwise quite tortuous with a very large hiatal hernia - Protonix 40mg  BID for now and at discharge - Liquid carafate 10cc po q 6 hours PRN - significant inflammation in proximal esophagus at site of impaction. He can use this PRN. It may make his stools dark if frequent use - We will touch base with primary team in regards to how long he needs to be in the hospital.  Unfortunately his tachycardia is persistent today.  Given rise in white blood cell count I would agree with continuing antibiotics for possible aspiration, can transition to oral regimen if he has no oxygen requirement and otherwise stable for discharge cardiac perspective  Thank you for hospitalist care of this patient. Please call me with any questions.  Haviland Cellar, MD [facility]

## 2020-02-11 NOTE — Progress Notes (Signed)
The patient's heart rate was mostly sustaining in the 130s.  It occasionally went down to the 60s-70s for a few minutes before becoming tachycardic again.  His blood pressure was a bit low around 95/78.  All other vitals were within normal limits.  Patient was alert and oriented and asymptomatic.  Informed the on call physician.  The doctor first ordered an EKG.  After it was performed the doctor then ordered a one time IV 500 cc normal saline bolus.  The bolus is currently going and the patient is resting in bed with the call bell with in reach.  Will continue to monitor.  Lupita Dawn, RN

## 2020-02-11 NOTE — Progress Notes (Addendum)
PROGRESS NOTE    Chris Braun  HKV:[id number] DOB: 01-10-1936 DOA: 02/10/2020 PCP: Penelope Coop, FNP   Brief Narrative:  Chris Braun is a 84 y.o. male with history of CAD status post stenting, status post ablation for ventricular arrhythmia, history of atrial tachycardia, COPD, memory problems was brought to the ER at [facility] from [facility] as a direct transfer after patient had gone to [place] ER on February 09, 2019 1 in the evening after patient had sudden food impaction after eating.  Since there was no gastroenterologist over there patient was transferred to [facility].  This afternoon patient was electively intubated for EGD and had EGD done by Dr. Havery Moros following which the food was disimpacted but per Dr. Havery Moros patient had a difficult procedure.  Following which patient was extubated.  Patient also briefly was an atrial tachycardia requiring metoprolol 5 mg IV following which heart rate improved to the 80s sinus rhythm.  After the procedure patient has been having bouts of productive cough for which chest x-ray was done showing possible infiltrates.  Patient was afebrile and not hypoxic.  Gastroenterology Dr. Havery Moros likes to patient be observed overnight given the brief episodes of atrial tachycardia not sure patient can still be completely able to swallow. Labs noted largely unremarkable.  EKG shows atrial tachycardia Covid test was negative.  ED Course: Labs done in the ER was showing WBC count of 11.6 otherwise unremarkable.  Covid test was negative.  EKG shows atrial tachycardia.  Atrial tachycardia improved with 1 dose of metoprolol 5 mg IV.  See history of present illness for further details.  Patient at the time of my exam is post extubation is able to talk well denies any chest pain.  Has been having productive cough no active wheezing.  Not hypoxic.  Assessment & Plan:   Food impaction: -status post EGD-4 has been disimpacted on  12/24 by GI. -EGD findings noted that he has significant edema at the site of food impaction-unable to appreciate stenosis, tortuous esophagus with very large hiatal hernia -Continue PPI twice daily and Carafate 10 cc p.o. every 6 hours as needed -Continue with clear liquid diet today, soft diet tomorrow as tolerated -Appreciate GI assistance.  Acute hypoxic Respiratory failure 2/2 Possible aspiration pneumonia: -Reviewed chest x-ray.  Patient is requiring 2 L of oxygen via nasal cannula.  He is afebrile with leukocytosis of 12.6 -Patient started on Unasyn-continue same for now. -We will ask speech therapy to evaluate the patient for aspiration.  Atrial tachycardia: -Patient has history of NSVT/VT status post ablation in 2008 -Patient's heart rate went up to 120s-130s.  500 cc of IV fluid bolus and 5 mg of IV Lopressor given.  TSH: WNL, lactic acid: WNL.  Patient's heart rate improved.  Continue to monitor on telemetry -Continue home meds digoxin and metoprolol 25 daily.  Check electrolytes  Coronary artery disease status post stent placement in 2003: Patient denies ACS symptoms -Continue aspirin, Plavix, statin and beta-blocker  Restless leg syndrome: Continue Requip  Dementia: Continue Aricept  BPH: Continue Flomax  History of COPD/asthma: No new wheezing noted on exam.  Continue home inhalers  Hyperlipidemia: Continue statin  Thrombocytopenia: Platelet 143.  No signs of active bleeding.  Repeat CBC tomorrow AM.  DVT prophylaxis: SCD Code Status: Full code Family Communication:  None present at bedside.  Plan of care discussed with patient in length and he verbalized understanding and agreed with it.  I called patient's wife and  discussed plan of care and she verbalized understanding.  Disposition Plan: Likely home tomorrow  Consultants:   GI  Procedures:   EGD  Antimicrobials:   Unasyn  Status is: Observation  Dispo: The patient is from: Home               Anticipated d/c is to: Home              Anticipated d/c date is: 1 day              Patient currently is not medically stable to d/c.    Subjective: Patient seen and examined.  Resting comfortably on the bed.  On 2 L of oxygen via nasal cannula.  He is hard of hearing.  Denies any new complaints.  Objective: Vitals:   02/11/20 0411 02/11/20 0510 02/11/20 0605 02/11/20 0839  BP: 97/78 101/80 101/64 105/75  Pulse: (!) 125 (!) 120 (!) 58 100  Resp: 18 20 14    Temp: 99 F (37.2 C)  98.1 F (36.7 C)   TempSrc: Oral  Oral   SpO2: 90% 95% 97%   Weight:      Height:        Intake/Output Summary (Last 24 hours) at 02/11/2020 1040 Last data filed at 02/11/2020 0631 Gross per 24 hour  Intake 600 ml  Output 200 ml  Net 400 ml   Filed Weights   02/10/20 1249 02/10/20 2221  Weight: 70.9 kg 68.8 kg    Examination:  General exam: Appears calm and comfortable, elderly, on 2 L of oxygen via nasal cannula Respiratory system: Clear to auscultation. Respiratory effort normal. Cardiovascular system: S1 & S2 heard, RRR. No JVD, murmurs, rubs, gallops or clicks. No pedal edema. Gastrointestinal system: Abdomen is nondistended, soft and nontender. No organomegaly or masses felt. Normal bowel sounds heard. Central nervous system: Alert and oriented. No focal neurological deficits. Extremities: Symmetric 5 x 5 power. Skin: No rashes, lesions or ulcers Psychiatry: Judgement and insight appear normal. Mood & affect appropriate.    Data Reviewed: I have personally reviewed following labs and imaging studies  CBC: Recent Labs  Lab 02/10/20 1345 02/11/20 0115  WBC 11.6* 12.6*  NEUTROABS 9.1*  --   HGB 15.2 13.4  HCT 44.0 38.0*  MCV 92.1 90.0  PLT 164 [id number]*   Basic Metabolic Panel: Recent Labs  Lab 02/10/20 1345 02/11/20 0115  NA 142 141  K 3.7 4.1  CL 102 103  CO2 27 25  GLUCOSE 95 92  BUN 14 13  CREATININE 0.99 0.97  CALCIUM 9.2 8.3*   GFR: Estimated Creatinine  Clearance: 51.1 mL/min (by C-G formula based on SCr of 0.97 mg/dL). Liver Function Tests: No results for input(s): AST, ALT, ALKPHOS, BILITOT, PROT, ALBUMIN in the last 168 hours. No results for input(s): LIPASE, AMYLASE in the last 168 hours. No results for input(s): AMMONIA in the last 168 hours. Coagulation Profile: No results for input(s): INR, PROTIME in the last 168 hours. Cardiac Enzymes: No results for input(s): CKTOTAL, CKMB, CKMBINDEX, TROPONINI in the last 168 hours. BNP (last 3 results) No results for input(s): PROBNP in the last 8760 hours. HbA1C: No results for input(s): HGBA1C in the last 72 hours. CBG: No results for input(s): GLUCAP in the last 168 hours. Lipid Profile: No results for input(s): CHOL, HDL, LDLCALC, TRIG, CHOLHDL, LDLDIRECT in the last 72 hours. Thyroid Function Tests: Recent Labs    02/11/20 0538  TSH 0.632   Anemia Panel: No results for input(s):  VITAMINB12, FOLATE, FERRITIN, TIBC, IRON, RETICCTPCT in the last 72 hours. Sepsis Labs: Recent Labs  Lab 02/11/20 0538 02/11/20 0709  LATICACIDVEN 0.7 0.7    Recent Results (from the past 240 hour(s))  Resp Panel by RT-PCR (Flu A&B, Covid) Nasopharyngeal Swab     Status: None   Collection Time: 02/10/20  1:45 PM   Specimen: Nasopharyngeal Swab; Nasopharyngeal(NP) swabs in vial transport medium  Result Value Ref Range Status   SARS Coronavirus 2 by RT PCR NEGATIVE NEGATIVE Final    Comment: (NOTE) SARS-CoV-2 target nucleic acids are NOT DETECTED.  The SARS-CoV-2 RNA is generally detectable in upper respiratory specimens during the acute phase of infection. The lowest concentration of SARS-CoV-2 viral copies this assay can detect is 138 copies/mL. A negative result does not preclude SARS-Cov-2 infection and should not be used as the sole basis for treatment or other patient management decisions. A negative result may occur with  improper specimen collection/handling, submission of specimen  other than nasopharyngeal swab, presence of viral mutation(s) within the areas targeted by this assay, and inadequate number of viral copies(<138 copies/mL). A negative result must be combined with clinical observations, patient history, and epidemiological information. The expected result is Negative.  Fact Sheet for Patients:  EntrepreneurPulse.com.au  Fact Sheet for Healthcare Providers:  IncredibleEmployment.be  This test is no t yet approved or cleared by the [place] FDA and  has been authorized for detection and/or diagnosis of SARS-CoV-2 by FDA under an Emergency Use Authorization (EUA). This EUA will remain  in effect (meaning this test can be used) for the duration of the COVID-19 declaration under Section 564(b)(1) of the Act, 21 U.S.C.section 360bbb-3(b)(1), unless the authorization is terminated  or revoked sooner.       Influenza A by PCR NEGATIVE NEGATIVE Final   Influenza B by PCR NEGATIVE NEGATIVE Final    Comment: (NOTE) The Xpert Xpress SARS-CoV-2/FLU/RSV plus assay is intended as an aid in the diagnosis of influenza from Nasopharyngeal swab specimens and should not be used as a sole basis for treatment. Nasal washings and aspirates are unacceptable for Xpert Xpress SARS-CoV-2/FLU/RSV testing.  Fact Sheet for Patients: EntrepreneurPulse.com.au  Fact Sheet for Healthcare Providers: IncredibleEmployment.be  This test is not yet approved or cleared by the [place] FDA and has been authorized for detection and/or diagnosis of SARS-CoV-2 by FDA under an Emergency Use Authorization (EUA). This EUA will remain in effect (meaning this test can be used) for the duration of the COVID-19 declaration under Section 564(b)(1) of the Act, 21 U.S.C. section 360bbb-3(b)(1), unless the authorization is terminated or revoked.  Performed at [facility], [place] [address], [place],  [place] [address]       Radiology Studies: DG CHEST PORT 1 VIEW  Result Date: 02/10/2020 CLINICAL DATA:  Aspiration into airway. EXAM: PORTABLE CHEST 1 VIEW COMPARISON:  January 11, 2020 FINDINGS: Decreased lung volumes are seen, likely secondary to the degree of patient inspiration. Moderate severity diffuse chronic appearing increased interstitial lung markings are seen. Mild atelectasis and/or infiltrate is noted within the left lung base. There is no evidence of a pleural effusion or pneumothorax. The heart size and mediastinal contours are within normal limits. Marked severity calcification of the aortic arch is seen. There is moderate severity dextroscoliosis of the thoracic spine with evidence of prior vertebroplasty seen within the mid and lower thoracic spine. IMPRESSION: Chronic appearing increased interstitial lung markings with mild left basilar atelectasis and/or infiltrate. Electronically Signed   By:  Virgina Norfolk M.D.   On: 02/10/2020 21:23    Scheduled Meds: . atorvastatin  80 mg Oral QHS  . calcitonin (salmon)  1 spray Alternating Nares QHS  . clopidogrel  75 mg Oral Daily  . digoxin  0.125 mg Oral QODAY  . donepezil  5 mg Oral QHS  . ipratropium-albuterol  3 mL Nebulization BID  . metoprolol succinate  25 mg Oral Daily  . metoprolol tartrate  5 mg Intravenous Once  . mometasone-formoterol  2 puff Inhalation BID  . pantoprazole (PROTONIX) IV  40 mg Intravenous Q12H  . rOPINIRole  0.5 mg Oral TID  . sucralfate  1 g Oral TID WC & HS  . tamsulosin  0.4 mg Oral QPC supper  . vitamin B-12  2,000 mcg Oral Daily   Continuous Infusions: . ampicillin-sulbactam (UNASYN) IV 3 g (02/11/20 0628)     LOS: 0 days   Time spent: 30 minutes   Kateryn Marasigan Loann Quill, MD [facility]  If 7PM-7AM, please contact night-coverage [email or web address] 02/11/2020, 10:40 AM

## 2020-02-12 LAB — CBC
HCT: 37.9 % — ABNORMAL LOW (ref 39.0–52.0)
Hemoglobin: 12.8 g/dL — ABNORMAL LOW (ref 13.0–17.0)
MCH: 31 pg (ref 26.0–34.0)
MCHC: 33.8 g/dL (ref 30.0–36.0)
MCV: 91.8 fL (ref 80.0–100.0)
Platelets: 143 10*3/uL — ABNORMAL LOW (ref 150–400)
RBC: 4.13 MIL/uL — ABNORMAL LOW (ref 4.22–5.81)
RDW: 13.4 % (ref 11.5–15.5)
WBC: 9.3 10*3/uL (ref 4.0–10.5)
nRBC: 0 % (ref 0.0–0.2)

## 2020-02-12 LAB — BASIC METABOLIC PANEL
Anion gap: 11 (ref 5–15)
BUN: 13 mg/dL (ref 8–23)
CO2: 23 mmol/L (ref 22–32)
Calcium: 7.8 mg/dL — ABNORMAL LOW (ref 8.9–10.3)
Chloride: 104 mmol/L (ref 98–111)
Creatinine, Ser: 0.92 mg/dL (ref 0.61–1.24)
GFR, Estimated: >60 mL/min (ref >60)
Glucose, Bld: 75 mg/dL (ref 70–99)
Potassium: 3.7 mmol/L (ref 3.5–5.1)
Sodium: 138 mmol/L (ref 135–145)

## 2020-02-12 LAB — MAGNESIUM: Magnesium: 1.5 mg/dL — ABNORMAL LOW (ref 1.7–2.4)

## 2020-02-12 MED ORDER — SODIUM CHLORIDE 0.9 % IV BOLUS
250.0000 mL | Freq: Once | INTRAVENOUS | Status: AC
  Administered 2020-02-12: 04:00:00 250 mL via INTRAVENOUS

## 2020-02-12 MED ORDER — METOPROLOL TARTRATE 5 MG/5ML IV SOLN
INTRAVENOUS | Status: AC
  Administered 2020-02-12: 19:00:00 5 mg via INTRAVENOUS
  Filled 2020-02-12: qty 5

## 2020-02-12 MED ORDER — MAGNESIUM SULFATE 2 GM/50ML IV SOLN
2.0000 g | Freq: Once | INTRAVENOUS | Status: AC
  Administered 2020-02-12: 15:00:00 2 g via INTRAVENOUS
  Filled 2020-02-12: qty 50

## 2020-02-12 MED ORDER — DIGOXIN 125 MCG PO TABS
0.1250 mg | ORAL_TABLET | Freq: Once | ORAL | Status: AC
  Administered 2020-02-12: 11:00:00 0.125 mg via ORAL
  Filled 2020-02-12: qty 1

## 2020-02-12 MED ORDER — METOPROLOL TARTRATE 5 MG/5ML IV SOLN: 5.0000 mg | Freq: Once | INTRAVENOUS | Status: AC

## 2020-02-12 MED ORDER — SODIUM CHLORIDE 0.9 % IV BOLUS
500.0000 mL | Freq: Once | INTRAVENOUS | Status: AC
  Administered 2020-02-12: 02:00:00 500 mL via INTRAVENOUS

## 2020-02-12 MED ORDER — METOPROLOL TARTRATE 25 MG PO TABS
25.0000 mg | ORAL_TABLET | Freq: Two times a day (BID) | ORAL | Status: DC
  Administered 2020-02-12 – 2020-02-13 (×2): 25 mg via ORAL
  Filled 2020-02-12 (×2): qty 1

## 2020-02-12 NOTE — Evaluation (Signed)
Clinical/Bedside Swallow Evaluation Patient Details  Name: Chris Braun MRN: [id number] Date of Birth: 1935/05/14  Today's Date: 02/12/2020 Time: SLP Start Time (ACUTE ONLY): 35 SLP Stop Time (ACUTE ONLY): 1553 SLP Time Calculation (min) (ACUTE ONLY): 13 min  Past Medical History:  Past Medical History:  Diagnosis Date  . Acute hypoxemic respiratory failure ([place]) 05/27/2018  . AKI (acute kidney injury) ([place]) 05/27/2018  . Asthma   . Atrial tachycardia ([place]) 07/26/2018  . B12 deficiency   . CAD (coronary artery disease) 08/24/2014  . Carotid stenosis, bilateral 08/24/2014  . Cataracts, bilateral   . Compression fracture    BAck  . Compression fracture of L2 lumbar vertebra, closed, initial encounter ([place]) 10/27/2019  . COPD (chronic obstructive pulmonary disease) ([place])   . Depression   . Diverticulosis    per colonscopy  . Dyslipidemia 08/24/2014  . Falls frequently 06/03/2015  . Gait abnormality 01/19/2018  . GERD (gastroesophageal reflux disease)   . Hiatal hernia   . High cholesterol   . Hypertension   . Hypoxia   . Idiopathic progressive polyneuropathy   . Kidney stones   . Lumbar vertebral fracture, pathologic 10/27/2019  . Macular degeneration   . Memory disorder 01/19/2018  . Myocardial infarction ([place]) 2002  . 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 Osteoporosis   . Right kidney stone 05/27/2018  . Right upper lobe pneumonia 05/27/2018  . Risk for falls 05/28/2015  . Status post ablation of ventricular arrhythmia 04/27/2018  . 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 Stroke ([place])   . SVT (supraventricular tachycardia) ([place]) 12/09/2019  . Weakness of distal arms and legs 06/03/2015   Past Surgical History:  Past Surgical History:  Procedure Laterality Date  . CARDIAC CATHETERIZATION  2002,   stents   . CATARACT EXTRACTION Left 2016  . Colonscopy  2012  . CYSTOSCOPY W/ URETERAL STENT PLACEMENT Right 05/30/2018   Procedure: CYSTOSCOPY WITH RETROGRADE PYELOGRAM/URETERAL  STENT PLACEMENT;  Surgeon: Irine Seal, MD;  Location: [facility];  Service: Urology;  Laterality: Right;  . ESOPHAGOGASTRODUODENOSCOPY (EGD) WITH PROPOFOL N/A 02/10/2020   Procedure: ESOPHAGOGASTRODUODENOSCOPY (EGD) WITH PROPOFOL;  Surgeon: Yetta Flock, MD;  Location: [facility];  Service: Gastroenterology;  Laterality: N/A;  . FOREIGN BODY REMOVAL  02/10/2020   Procedure: FOREIGN BODY REMOVAL;  Surgeon: Yetta Flock, MD;  Location: [facility] ENDOSCOPY;  Service: Gastroenterology;;  . HIP FRACTURE SURGERY  2008   right  . HYDROCELE EXCISION  10/12/2014  . LITHOTRIPSY     2007, 2009, 2010, 2011, 2012, 2013  . LUMBAR LAMINECTOMY/DECOMPRESSION MICRODISCECTOMY  03/15/2012   Procedure: LUMBAR LAMINECTOMY/DECOMPRESSION MICRODISCECTOMY 1 LEVEL;  Surgeon: Elaina Hoops, MD;  Location: [facility] NEURO ORS;  Service: Neurosurgery;  Laterality: Left;  Left lumbar three-four decompressive lumbar laminectomy, discectomy  . MAXIMUM ACCESS (MAS)POSTERIOR LUMBAR INTERBODY FUSION (PLIF) 2 LEVEL N/A 12/08/2012   Procedure: FOR MAXIMUM ACCESS (MAS) POSTERIOR LUMBAR INTERBODY FUSION (PLIF) 2 LEVEL;  Surgeon: Elaina Hoops, MD;  Location: [facility] NEURO ORS;  Service: Neurosurgery;  Laterality: N/A;  FOR MAXIMUM ACCESS (MAS) POSTERIOR LUMBAR INTERBODY FUSION (PLIF) 2 LEVEL  . ROTATOR CUFF REPAIR  1999   bil   . Max  . TOTAL HIP ARTHROPLASTY Right 2008  . VT study     with Ablation   HPI:  Pt is an 84 y.o. male with history of CAD status post stenting, status post ablation for ventricular arrhythmia, history of atrial tachycardia, COPD, and  memory problems who was brought to the ER at [facility] from [facility] after patient had sudden food impaction after eating. By the time he presented to the hospital it was more than 24 hours since the impaction.  He had an EGD on 12/24 with anesthesia support and was intubation. Before the case started anesthesia was concerned that was  evidence of aspiration already. Per GI pt had a "very large bolus of meat stuck at the upper esophagus/UES/posterior pharynx which was challenging to remove" but successfully removed. EGD also revealed tortuous esophagus with a very large hiatal hernia. Due to the food impaction the patient was not able to take his normal medications and he had atrial tachycardia with heart rates on 12/24.  Given his heart rate and concern for possible aspiration, he was admitted to the hospital. GI recommended a full liquid diet on 12/25 with possible progression to soft on 12/26. Per GI's (Dr. Havery Moros) note on 12/26 "I would keep on full liquids for now until speech evaluation is done. I don't think he should have any meats or regular diet until he has more time for his esophagus to heal (significant inflammatory changes and edema at site of impaction). Would not advance beyond a soft diet until he has had outpatient follow up." CXR 12/24: Chronic appearing increased interstitial lung markings with mild left basilar atelectasis and/or infiltrate.   Assessment / Plan / Recommendation Clinical Impression  Pt was seen for bedside swallow evaluation and he denied a history of oropharyngeal dysphagia. Oral mechanism exam was [facility]. He was edentulous but stated that he "does pretty okay" with meats and more advanced solids. A single instance of throat clearing was noted with thin liquids, but no other symptoms of oropharyngeal dysphagia. A dysphagia 1 diet with thin liquids is recommended at this time. Nursing reported that the pt exhibits coughing with liquids. This appears to be associated with a posterior head tilt; however, considering RN's report, SLP will follow to ensure diet tolerance. SLP Visit Diagnosis: Dysphagia, unspecified (R13.10)    Aspiration Risk       Diet Recommendation Dysphagia 1 (Puree);Thin liquid   Liquid Administration via: Cup;Straw Medication Administration: Crushed with puree Supervision:  Staff to assist with self feeding Compensations: Slow rate;Small sips/bites Postural Changes: Seated upright at 90 degrees    Other  Recommendations Oral Care Recommendations: Oral care BID   Follow up Recommendations None      Frequency and Duration min 2x/week  1 week       Prognosis Prognosis for Safe Diet Advancement: Fair Barriers to Reach Goals:  (?esophageal dysfunction)      Swallow Study   General Date of Onset: 02/10/20 HPI: Pt is an 84 y.o. male with history of CAD status post stenting, status post ablation for ventricular arrhythmia, history of atrial tachycardia, COPD, and memory problems who was brought to the ER at [facility] from [facility] after patient had sudden food impaction after eating. By the time he presented to the hospital it was more than 24 hours since the impaction.  He had an EGD on 12/24 with anesthesia support and was intubation. Before the case started anesthesia was concerned that was evidence of aspiration already. Per GI pt had a "very large bolus of meat stuck at the upper esophagus/UES/posterior pharynx which was challenging to remove" but successfully removed. EGD also revealed tortuous esophagus with a very large hiatal hernia. Due to the food impaction the patient was not able to take  his normal medications and he had atrial tachycardia with heart rates on 12/24.  Given his heart rate and concern for possible aspiration, he was admitted to the hospital. GI recommended a full liquid diet on 12/25 with possible progression to soft on 12/26. Per GI's (Dr. Havery Moros) note on 12/26 "I would keep on full liquids for now until speech evaluation is done. I don't think he should have any meats or regular diet until he has more time for his esophagus to heal (significant inflammatory changes and edema at site of impaction). Would not advance beyond a soft diet until he has had outpatient follow up." CXR 12/24: Chronic appearing increased  interstitial lung markings with mild left basilar atelectasis and/or infiltrate. Type of Study: Bedside Swallow Evaluation Previous Swallow Assessment: None Diet Prior to this Study: Thin liquids (full liquids) Temperature Spikes Noted: No Respiratory Status: Room air History of Recent Intubation: No Behavior/Cognition: Alert;Cooperative;Pleasant mood Oral Cavity Assessment: Within Functional Limits Oral Care Completed by SLP: No Vision: Functional for self-feeding Self-Feeding Abilities: Needs assist Patient Positioning: Upright in bed;Postural control adequate for testing Baseline Vocal Quality: Normal Volitional Cough: Strong Volitional Swallow: Able to elicit    Oral/Motor/Sensory Function Overall Oral Motor/Sensory Function: Within functional limits   Ice Chips Ice chips: Within functional limits Presentation: Spoon   Thin Liquid Thin Liquid: Within functional limits Presentation: Straw Other Comments:  (throat clearing noted once)    Nectar Thick Nectar Thick Liquid: Not tested   Honey Thick Honey Thick Liquid: Not tested   Puree Puree: Within functional limits Presentation: Spoon   Solid     Solid: Not tested     [place] I. Hardin Negus, [place], [place] Office number [phone] Pager [place] 02/12/2020,4:05 PM

## 2020-02-12 NOTE — Progress Notes (Signed)
Progress Note   Subjective  Patient states he continues to swallow okay. No issues with the liquids. He does have a mild oxygen requirement. WBC improving on Abx. His tachycardia continues to fluctuate.   Objective   Vital signs in last 24 hours: Temp:  [97.6 F (36.4 C)-98.2 F (36.8 C)] 97.6 F (36.4 C) (12/26 0357) Pulse Rate:  [55-137] 120 (12/26 0809) Resp:  [12-20] 20 (12/26 0627) BP: (87-119)/(56-84) 119/80 (12/26 0809) SpO2:  [91 %-96 %] 91 % (12/26 0627) Last BM Date: 02/11/20 General:    white male in NAD Heart:  Tachycardic - HR ranges from 60s to 130s on monitor Lungs: Respirations even and unlabored Abdomen:  Soft, nontender and nondistended.  Extremities:  Without edema. Neurologic:  Alert and oriented,  grossly normal neurologically. Psych:  Cooperative. Normal mood and affect.  Intake/Output from previous day: 12/25 0701 - 12/26 0700 In: 2051.2 [P.O.:300; IV Piggyback:1751.2] Out: [id number] [Urine:1170] Intake/Output this shift: No intake/output data recorded.  Lab Results: Recent Labs    02/10/20 1345 02/11/20 0115 02/12/20 0251  WBC 11.6* 12.6* 9.3  HGB 15.2 13.4 12.8*  HCT 44.0 38.0* 37.9*  PLT 164 143* 143*   BMET Recent Labs    02/10/20 1345 02/11/20 0115 02/12/20 0251  NA 142 141 138  K 3.7 4.1 3.7  CL 102 103 104  CO2 27 25 23   GLUCOSE 95 92 75  BUN 14 13 13   CREATININE 0.99 0.97 0.92  CALCIUM 9.2 8.3* 7.8*   LFT No results for input(s): PROT, ALBUMIN, AST, ALT, ALKPHOS, BILITOT, BILIDIR, IBILI in the last 72 hours. PT/INR No results for input(s): LABPROT, INR in the last 72 hours.  Studies/Results: DG CHEST PORT 1 VIEW  Result Date: 02/10/2020 CLINICAL DATA:  Aspiration into airway. EXAM: PORTABLE CHEST 1 VIEW COMPARISON:  January 11, 2020 FINDINGS: Decreased lung volumes are seen, likely secondary to the degree of patient inspiration. Moderate severity diffuse chronic appearing increased interstitial lung markings are  seen. Mild atelectasis and/or infiltrate is noted within the left lung base. There is no evidence of a pleural effusion or pneumothorax. The heart size and mediastinal contours are within normal limits. Marked severity calcification of the aortic arch is seen. There is moderate severity dextroscoliosis of the thoracic spine with evidence of prior vertebroplasty seen within the mid and lower thoracic spine. IMPRESSION: Chronic appearing increased interstitial lung markings with mild left basilar atelectasis and/or infiltrate. Electronically Signed   By: Virgina Norfolk M.D.   On: 02/10/2020 21:23       Assessment / Plan:   84 year old male on aspirin and Plavix, COPD, chronic dysphagia, presented with a severe food impaction 12/24. Large bolus of meat in the upper esophagus / UES / posterior pharynx, patient electively intubated and removal of meat performed. Concern for possible aspiration pneumonia due to the impaction, he was admitted. He's also had variable HR with atrial tachycardia upwards of 130s that has persisted.  Generally swallowing liquids okay. I would keep on full liquids for now until speech evaluation is done. I don't think he should have any meats or regular diet until he has more time for his esophagus to heal (significant inflammatory changes and edema at site of impaction), and he can follow up with his primary GI physician. Would not advance beyond a soft diet until he has had outpatient follow up  Recommend: - full liquid diet for now - agree with speech path eval - management of possible aspiration  PNA and tachycardia per primary service, agree with course of empiric antibiotics for possible aspiration - protonix 40mg  twice daily at time of discharge. Can use liquid carafate PRN for any odynophagia he may have    At this point in time we will sign off. Once tachycardia can be controlled and oxygen requirement resolves patient can go home, and follow up with his primary GI in  [place].   Please call with any questions / concerns otherwise.  Riverside Cellar, MD [facility]

## 2020-02-12 NOTE — Progress Notes (Signed)
Pt has received two 500 cc boluses and one 250 cc bolus tonight.  His blood pressure has gone up to 106/72 and his heart rate is mostly ranging from 126 - 132 now.  Physician aware. Will continue to monitor.  Lupita Dawn, RN

## 2020-02-12 NOTE — Progress Notes (Signed)
PROGRESS NOTE    Chris Braun  [id number] DOB: 27-Sep-1935 DOA: 02/10/2020 PCP: Penelope Coop, FNP   Brief Narrative:  Chris Braun is a 84 y.o. male with history of CAD status post stenting, status post ablation for ventricular arrhythmia, history of atrial tachycardia, COPD, memory problems was brought to the ER at [facility] from [facility] as a direct transfer after patient had gone to [place] ER on February 09, 2019 1 in the evening after patient had sudden food impaction after eating.  Since there was no gastroenterologist over there patient was transferred to [facility].  This afternoon patient was electively intubated for EGD and had EGD done by Dr. Havery Moros following which the food was disimpacted but per Dr. Havery Moros patient had a difficult procedure.  Following which patient was extubated.  Patient also briefly was an atrial tachycardia requiring metoprolol 5 mg IV following which heart rate improved to the 80s sinus rhythm.  After the procedure patient has been having bouts of productive cough for which chest x-ray was done showing possible infiltrates.  Patient was afebrile and not hypoxic.  Gastroenterology Dr. Havery Moros likes to patient be observed overnight given the brief episodes of atrial tachycardia not sure patient can still be completely able to swallow. Labs noted largely unremarkable.  EKG shows atrial tachycardia Covid test was negative.  ED Course: Labs done in the ER was showing WBC count of 11.6 otherwise unremarkable.  Covid test was negative.  EKG shows atrial tachycardia.  Atrial tachycardia improved with 1 dose of metoprolol 5 mg IV.  See history of present illness for further details.  Patient at the time of my exam is post extubation is able to talk well denies any chest pain.  Has been having productive cough no active wheezing.  Not hypoxic.  Assessment & Plan:   Food impaction: -status post EGD-4 has been disimpacted on  12/24 by GI. -EGD findings noted that he has significant edema at the site of food impaction-unable to appreciate stenosis, tortuous esophagus with very large hiatal hernia -Continue PPI twice daily and Carafate 10 cc p.o. every 6 hours as needed -Continue with clear liquid diet today, soft diet tomorrow as tolerated -Appreciate GI assistance-GI signed off.  Acute hypoxic Respiratory failure 2/2 Possible aspiration pneumonia: -Reviewed chest x-ray.  Patient is requiring 2 L of oxygen via nasal cannula.  He is afebrile with leukocytosis of 12.6 -Patient started on Unasyn-continue same for now. -We will ask speech therapy to evaluate the patient for aspiration. -We will try to wean off of oxygen  Atrial tachycardia: -Patient has history of NSVT/VT status post ablation in 2008 -TSH: WNL, lactic acid: WNL.   -He continues to remain tachycardic.  1 L IV fluid bolus given.  - Discussed with on-call cardiology Dr. Gwenlyn Found who recommended to change Toprol-XL to metoprolol tartrate 25 mg twice daily. -Also his digoxin level was noted to be low on admission.  Discussed with pharmacy will give digoxin 0.125 mg p.o. once along with his usual dose. -Monitor heart rate closely.  Coronary artery disease status post stent placement in 2003: Patient denies ACS symptoms -Continue aspirin, Plavix, statin and beta-blocker  Restless leg syndrome: Continue Requip  Dementia: Continue Aricept  BPH: Continue Flomax  History of COPD/asthma: No new wheezing noted on exam.  Continue home inhalers  Hyperlipidemia: Continue statin  Thrombocytopenia: Platelet 143.  No signs of active bleeding.   Hypomagnesemia: Replenished.  Repeat magnesium level tomorrow AM.  DVT  prophylaxis: SCD Code Status: Full code Family Communication:  None present at bedside.  Plan of care discussed with patient in length and he verbalized understanding and agreed with it.  I called patient's wife and his daughter with no  response.  Disposition Plan: Likely home tomorrow  Consultants:   GI  Procedures:   EGD  Antimicrobials:   Unasyn  Status is: Observation  Dispo: The patient is from: Home              Anticipated d/c is to: Home              Anticipated d/c date is: 1 day              Patient currently is not medically stable to d/c.    Subjective: Patient seen and examined.  No new complaints.  Denies chest pain, palpitation, shortness of breath, orthopnea or PND.  No acute events overnight  Objective: Vitals:   02/12/20 0809 02/12/20 0900 02/12/20 1100 02/12/20 1126  BP: 119/80 128/79 123/83   Pulse: 62 (!) 110 (!) 127 62  Resp: 18 18 18    Temp:  97.9 F (36.6 C) 97.6 F (36.4 C)   TempSrc:  Oral Oral   SpO2: 96% 96% 97%   Weight:      Height:        Intake/Output Summary (Last 24 hours) at 02/12/2020 1255 Last data filed at 02/12/2020 1250 Gross per 24 hour  Intake 1851.18 ml  Output 1670 ml  Net 181.18 ml   Filed Weights   02/10/20 1249 02/10/20 2221  Weight: 70.9 kg 68.8 kg    Examination:  General exam: Appears calm and comfortable, elderly, on 2 L of oxygen via nasal cannula Respiratory system: Clear to auscultation. Respiratory effort normal. Cardiovascular system: Tachycardic, no JVD, murmurs, rubs, gallops or clicks. No pedal edema. Gastrointestinal system: Abdomen is nondistended, soft and nontender. No organomegaly or masses felt. Normal bowel sounds heard. Central nervous system: Alert and oriented. No focal neurological deficits. Extremities: Symmetric 5 x 5 power. Skin: No rashes, lesions or ulcers Psychiatry: Judgement and insight appear normal. Mood & affect appropriate.    Data Reviewed: I have personally reviewed following labs and imaging studies  CBC: Recent Labs  Lab 02/10/20 1345 02/11/20 0115 02/12/20 0251  WBC 11.6* 12.6* 9.3  NEUTROABS 9.1*  --   --   HGB 15.2 13.4 12.8*  HCT 44.0 38.0* 37.9*  MCV 92.1 90.0 91.8  PLT 164 143*  [id number]*   Basic Metabolic Panel: Recent Labs  Lab 02/10/20 1345 02/11/20 0115 02/12/20 0251  NA 142 141 138  K 3.7 4.1 3.7  CL 102 103 104  CO2 27 25 23   GLUCOSE 95 92 75  BUN 14 13 13   CREATININE 0.99 0.97 0.92  CALCIUM 9.2 8.3* 7.8*  MG  --   --  1.5*   GFR: Estimated Creatinine Clearance: 53.8 mL/min (by C-G formula based on SCr of 0.92 mg/dL). Liver Function Tests: No results for input(s): AST, ALT, ALKPHOS, BILITOT, PROT, ALBUMIN in the last 168 hours. No results for input(s): LIPASE, AMYLASE in the last 168 hours. No results for input(s): AMMONIA in the last 168 hours. Coagulation Profile: No results for input(s): INR, PROTIME in the last 168 hours. Cardiac Enzymes: No results for input(s): CKTOTAL, CKMB, CKMBINDEX, TROPONINI in the last 168 hours. BNP (last 3 results) No results for input(s): PROBNP in the last 8760 hours. HbA1C: No results for input(s): HGBA1C in the last  72 hours. CBG: No results for input(s): GLUCAP in the last 168 hours. Lipid Profile: No results for input(s): CHOL, HDL, LDLCALC, TRIG, CHOLHDL, LDLDIRECT in the last 72 hours. Thyroid Function Tests: Recent Labs    02/11/20 0538  TSH 0.632   Anemia Panel: No results for input(s): VITAMINB12, FOLATE, FERRITIN, TIBC, IRON, RETICCTPCT in the last 72 hours. Sepsis Labs: Recent Labs  Lab 02/11/20 0538 02/11/20 0709  LATICACIDVEN 0.7 0.7    Recent Results (from the past 240 hour(s))  Resp Panel by RT-PCR (Flu A&B, Covid) Nasopharyngeal Swab     Status: None   Collection Time: 02/10/20  1:45 PM   Specimen: Nasopharyngeal Swab; Nasopharyngeal(NP) swabs in vial transport medium  Result Value Ref Range Status   SARS Coronavirus 2 by RT PCR NEGATIVE NEGATIVE Final    Comment: (NOTE) SARS-CoV-2 target nucleic acids are NOT DETECTED.  The SARS-CoV-2 RNA is generally detectable in upper respiratory specimens during the acute phase of infection. The lowest concentration of SARS-CoV-2 viral  copies this assay can detect is 138 copies/mL. A negative result does not preclude SARS-Cov-2 infection and should not be used as the sole basis for treatment or other patient management decisions. A negative result may occur with  improper specimen collection/handling, submission of specimen other than nasopharyngeal swab, presence of viral mutation(s) within the areas targeted by this assay, and inadequate number of viral copies(<138 copies/mL). A negative result must be combined with clinical observations, patient history, and epidemiological information. The expected result is Negative.  Fact Sheet for Patients:  EntrepreneurPulse.com.au  Fact Sheet for Healthcare Providers:  IncredibleEmployment.be  This test is no t yet approved or cleared by the [place] FDA and  has been authorized for detection and/or diagnosis of SARS-CoV-2 by FDA under an Emergency Use Authorization (EUA). This EUA will remain  in effect (meaning this test can be used) for the duration of the COVID-19 declaration under Section 564(b)(1) of the Act, 21 U.S.C.section 360bbb-3(b)(1), unless the authorization is terminated  or revoked sooner.       Influenza A by PCR NEGATIVE NEGATIVE Final   Influenza B by PCR NEGATIVE NEGATIVE Final    Comment: (NOTE) The Xpert Xpress SARS-CoV-2/FLU/RSV plus assay is intended as an aid in the diagnosis of influenza from Nasopharyngeal swab specimens and should not be used as a sole basis for treatment. Nasal washings and aspirates are unacceptable for Xpert Xpress SARS-CoV-2/FLU/RSV testing.  Fact Sheet for Patients: EntrepreneurPulse.com.au  Fact Sheet for Healthcare Providers: IncredibleEmployment.be  This test is not yet approved or cleared by the [place] FDA and has been authorized for detection and/or diagnosis of SARS-CoV-2 by FDA under an Emergency Use Authorization (EUA). This  EUA will remain in effect (meaning this test can be used) for the duration of the COVID-19 declaration under Section 564(b)(1) of the Act, 21 U.S.C. section 360bbb-3(b)(1), unless the authorization is terminated or revoked.  Performed at [facility], [place] [address], [place], [place] [address]       Radiology Studies: DG CHEST PORT 1 VIEW  Result Date: 02/10/2020 CLINICAL DATA:  Aspiration into airway. EXAM: PORTABLE CHEST 1 VIEW COMPARISON:  January 11, 2020 FINDINGS: Decreased lung volumes are seen, likely secondary to the degree of patient inspiration. Moderate severity diffuse chronic appearing increased interstitial lung markings are seen. Mild atelectasis and/or infiltrate is noted within the left lung base. There is no evidence of a pleural effusion or pneumothorax. The heart size and mediastinal contours are within normal  limits. Marked severity calcification of the aortic arch is seen. There is moderate severity dextroscoliosis of the thoracic spine with evidence of prior vertebroplasty seen within the mid and lower thoracic spine. IMPRESSION: Chronic appearing increased interstitial lung markings with mild left basilar atelectasis and/or infiltrate. Electronically Signed   By: Virgina Norfolk M.D.   On: 02/10/2020 21:23    Scheduled Meds: . atorvastatin  80 mg Oral QHS  . calcitonin (salmon)  1 spray Alternating Nares QHS  . clopidogrel  75 mg Oral Daily  . digoxin  0.125 mg Oral QODAY  . donepezil  5 mg Oral QHS  . metoprolol tartrate  25 mg Oral BID  . mometasone-formoterol  2 puff Inhalation BID  . pantoprazole (PROTONIX) IV  40 mg Intravenous Q12H  . rOPINIRole  0.5 mg Oral TID  . sucralfate  1 g Oral TID WC & HS  . tamsulosin  0.4 mg Oral QPC supper  . vitamin B-12  2,000 mcg Oral Daily   Continuous Infusions: . ampicillin-sulbactam (UNASYN) IV 3 g (02/12/20 1128)     LOS: 1 day   Time spent: 30 minutes   Elery Cadenhead Loann Quill, MD [facility]  If  7PM-7AM, please contact night-coverage [email or web address] 02/12/2020, 12:55 PM

## 2020-02-13 ENCOUNTER — Ambulatory Visit: Payer: PPO | Admitting: Cardiology

## 2020-02-13 DIAGNOSIS — Z8679 Personal history of other diseases of the circulatory system: Secondary | ICD-10-CM

## 2020-02-13 DIAGNOSIS — Z9889 Other specified postprocedural states: Secondary | ICD-10-CM

## 2020-02-13 DIAGNOSIS — E785 Hyperlipidemia, unspecified: Secondary | ICD-10-CM

## 2020-02-13 DIAGNOSIS — I2510 Atherosclerotic heart disease of native coronary artery without angina pectoris: Secondary | ICD-10-CM

## 2020-02-13 LAB — MAGNESIUM: Magnesium: 1.8 mg/dL (ref 1.7–2.4)

## 2020-02-13 MED ORDER — MAGNESIUM SULFATE 2 GM/50ML IV SOLN
2.0000 g | Freq: Once | INTRAVENOUS | Status: AC
  Administered 2020-02-13: 08:00:00 2 g via INTRAVENOUS
  Filled 2020-02-13: qty 50

## 2020-02-13 MED ORDER — METOPROLOL TARTRATE 25 MG PO TABS: 25.0000 mg | ORAL_TABLET | Freq: Three times a day (TID) | ORAL | Status: DC

## 2020-02-13 MED ORDER — VERAPAMIL HCL ER 120 MG PO TBCR
120.0000 mg | EXTENDED_RELEASE_TABLET | Freq: Every day | ORAL | Status: DC
  Filled 2020-02-13: qty 1

## 2020-02-13 MED ORDER — DILTIAZEM LOAD VIA INFUSION
10.0000 mg | Freq: Once | INTRAVENOUS | Status: DC
  Filled 2020-02-13: qty 10

## 2020-02-13 MED ORDER — DILTIAZEM HCL-DEXTROSE 125-5 MG/125ML-% IV SOLN (PREMIX)
5.0000 mg/h | INTRAVENOUS | Status: DC
  Filled 2020-02-13: qty 125

## 2020-02-13 MED ORDER — METOPROLOL TARTRATE 25 MG PO TABS: 25.0000 mg | ORAL_TABLET | Freq: Two times a day (BID) | ORAL | Status: DC

## 2020-02-13 NOTE — Progress Notes (Addendum)
Notified by nurse regarding 2 episodes of sinus pause. I reviewed the strip with Dr. Tresa Endo, IV cardizem was never started, will d/c orders. Metoprolol tatrate was also discontinued. With both tachycardia and prolonged pauses, will get EP involved. Note, patient is still on digoxin every other day, received a dose today, next dose Wednesday, will defer to EP service to decide whether to stop digoxin as well.

## 2020-02-13 NOTE — Progress Notes (Signed)
  Speech Language Pathology Treatment: Dysphagia  Patient Details Name: Chris Braun MRN: [id number] DOB: 09-May-1935 Today's Date: 02/13/2020 Time: 9628-3662 SLP Time Calculation (min) (ACUTE ONLY): 12 min  Assessment / Plan / Recommendation Clinical Impression  Pt was seen with part of his lunch tray, consisting of thin liquids and purees. SLP provided Min cues, faded to supervision, to use esophageal precautions. Even when SLP was assisting with feeding, pt does a good job of directing care to limit bolus size and rate himself. No overt signs of difficulty were noted initially, although delayed coughing was observed as meal continued. Given known esophageal issues, SLP encouraged pt to take breaks if he started coughing. Suspect he will likely benefit from smaller, more frequent intake. Will maintain current diet (Dys 1, thin liquids) as per GI note, pt is not to have any pieces of meat until he has OP GI follow up. Will f/u briefly for tolerance.    HPI HPI: Pt is an 84 y.o. male with history of CAD status post stenting, status post ablation for ventricular arrhythmia, history of atrial tachycardia, COPD, and memory problems who was brought to the ER at [facility] from [facility] after patient had sudden food impaction after eating. By the time he presented to the hospital it was more than 24 hours since the impaction.  He had an EGD on 12/24 with anesthesia support and was intubation. Before the case started anesthesia was concerned that was evidence of aspiration already. Per GI pt had a "very large bolus of meat stuck at the upper esophagus/UES/posterior pharynx which was challenging to remove" but successfully removed. EGD also revealed tortuous esophagus with a very large hiatal hernia. Due to the food impaction the patient was not able to take his normal medications and he had atrial tachycardia with heart rates on 12/24.  Given his heart rate and concern for possible  aspiration, he was admitted to the hospital. GI recommended a full liquid diet on 12/25 with possible progression to soft on 12/26. Per GI's (Dr. Adela Lank) note on 12/26 "I would keep on full liquids for now until speech evaluation is done. I don't think he should have any meats or regular diet until he has more time for his esophagus to heal (significant inflammatory changes and edema at site of impaction). Would not advance beyond a soft diet until he has had outpatient follow up." CXR 12/24: Chronic appearing increased interstitial lung markings with mild left basilar atelectasis and/or infiltrate.      SLP Plan  Continue with current plan of care       Recommendations  Diet recommendations: Dysphagia 1 (puree);Thin liquid Liquids provided via: Cup;Straw Medication Administration: Crushed with puree Supervision: Staff to assist with self feeding Compensations: Slow rate;Small sips/bites Postural Changes and/or Swallow Maneuvers: Seated upright 90 degrees;Upright 30-60 min after meal                Oral Care Recommendations: Oral care BID Follow up Recommendations: None SLP Visit Diagnosis: Dysphagia, unspecified (R13.10) Plan: Continue with current plan of care       GO                Mahala Menghini., M.A. CCC-SLP Acute Rehabilitation Services Pager [phone] Office [phone]  02/13/2020, 1:33 PM

## 2020-02-13 NOTE — Evaluation (Signed)
Physical Therapy Evaluation Patient Details Name: Chris Braun MRN: [id number] DOB: [birth year] Today's Date: 02/13/2020   History of Present Illness  Patient is a 84 y/o male who presents with food impaction, which was removed 12/24. Also with respiratory failure secondary to possible aspiration PNA and atrial tachycardia. PMH includes sinus tachycardia, NSVT/VT s/p ablation in 2008, CAD with remote MI and stenting in 2003, dyslipidemia, HTN, asthma/COPD, stroke, dementia, severe osteoporosis with multiple thoracic spine compression fractures  Clinical Impression  Patient presents with generalized weakness, impaired balance, decreased activity tolerance and impaired cognition s/p above. Pt lives at home with wife and reports doing minimal ambulation with use of RW for support. Reports using his w/c for mobility and being able to transfer independently, however pt with hx of dementia and not the best historian. Per discussion with nurse, wife is not able to care for pt at home anymore. Today, pt requires mod A for bed mobility and standing with use of RW and able to take a few steps along side bed with min A. HR ranging from 130s-150s bpm with activity; + dizziness. Would benefit from SNF to maximize independence and mobility prior to return home. Will follow acutely.    Follow Up Recommendations SNF;Supervision for mobility/OOB    Equipment Recommendations  None recommended by PT    Recommendations for Other Services       Precautions / Restrictions Precautions Precautions: Fall;Other (comment) Precaution Comments: watch HR Restrictions Weight Bearing Restrictions: No      Mobility  Bed Mobility Overal bed mobility: Needs Assistance Bed Mobility: Rolling;Sidelying to Sit;Sit to Sidelying Rolling: Min guard Sidelying to sit: Mod assist;HOB elevated     Sit to sidelying: Min guard;HOB elevated General bed mobility comments: Step by step cues for sequencing and to reach for rail,  assist with trunk and scooting bottom to EOB. Able to bring LEs into bed to return to supine.    Transfers Overall transfer level: Needs assistance Equipment used: Rolling walker (2 wheeled) Transfers: Sit to/from Stand Sit to Stand: Mod assist         General transfer comment: Assist to power to standing with cues for hand placement/technique. Posterior bias.  Ambulation/Gait Ambulation/Gait assistance: Min assist Gait Distance (Feet): 5 Feet Assistive device: Rolling walker (2 wheeled) Gait Pattern/deviations: Trunk flexed;Leaning posteriorly Gait velocity: decreased   General Gait Details: Able to take a few steps along side bed with Min A for balance and RW management, leaning posteriorly with legs supported on bed rails. HR up to 150s bpm max. Reports some dizziness.  Stairs            Wheelchair Mobility    Modified Rankin (Stroke Patients Only)       Balance Overall balance assessment: Needs assistance Sitting-balance support: Feet supported;No upper extremity supported Sitting balance-Leahy Scale: Fair Sitting balance - Comments: Min guard for safety. posterior lean with MMT of LEs but no LOB. Postural control: Posterior lean Standing balance support: During functional activity Standing balance-Leahy Scale: Poor Standing balance comment: Requires UE support and external support for standing balance due to posterior lean and weakness.                             Pertinent Vitals/Pain Pain Assessment: Faces Faces Pain Scale: Hurts a little bit Pain Location: back Pain Descriptors / Indicators: Discomfort Pain Intervention(s): Monitored during session;Repositioned    Home Living Family/patient expects to be discharged to:: Private residence  Living Arrangements: Spouse/significant other Available Help at Discharge: Family;Available 24 hours/day Type of Home: House Home Access: Stairs to enter Entrance Stairs-Rails: None Entrance  Stairs-Number of Steps: 1 Home Layout: One level Home Equipment: Clinical cytogeneticist - 2 wheels;Bedside commode;Wheelchair - manual      Prior Function Level of Independence: Needs assistance   Gait / Transfers Assistance Needed: Pt reports minimal ambulation with use of RW, uses w/c as well, performs SPT to w/c/toilet/bed.  ADL's / Homemaking Assistance Needed: LIkely assist from family  Comments: Pt not the best historian; a lot of info above taken from admission 2 months ago. After chatting with nurse, pt's wife reports she cannot care for pt at home anymore.     Hand Dominance   Dominant Hand: Right    Extremity/Trunk Assessment   Upper Extremity Assessment Upper Extremity Assessment: Defer to OT evaluation    Lower Extremity Assessment Lower Extremity Assessment: Generalized weakness    Cervical / Trunk Assessment Cervical / Trunk Assessment: Kyphotic  Communication   Communication: No difficulties  Cognition Arousal/Alertness: Awake/alert Behavior During Therapy: WFL for tasks assessed/performed Overall Cognitive Status: Impaired/Different from baseline Area of Impairment: Orientation;Attention;Memory;Following commands;Safety/judgement;Problem solving                 Orientation Level: Disoriented to;Place;Situation Current Attention Level: Sustained Memory: Decreased short-term memory Following Commands: Follows one step commands consistently Safety/Judgement: Decreased awareness of deficits;Decreased awareness of safety   Problem Solving: Slow processing        General Comments General comments (skin integrity, edema, etc.): HR in low 130s upon arrival and increased to 150s bpm max with activity.    Exercises     Assessment/Plan    PT Assessment Patient needs continued PT services  PT Problem List Decreased strength;Decreased mobility;Decreased safety awareness;Decreased balance;Pain;Cardiopulmonary status limiting activity;Decreased  cognition;Decreased activity tolerance       PT Treatment Interventions Therapeutic exercise;Gait training;Balance training;Neuromuscular re-education;Functional mobility training;Therapeutic activities;Cognitive remediation;Wheelchair mobility training    PT Goals (Current goals can be found in the Care Plan section)  Acute Rehab PT Goals Patient Stated Goal: to get some rest PT Goal Formulation: Patient unable to participate in goal setting Time For Goal Achievement: 02/27/20 Potential to Achieve Goals: Fair    Frequency Min 2X/week   Barriers to discharge Decreased caregiver support wife cannot care for pt at home    Co-evaluation               AM-PAC PT "6 Clicks" Mobility  Outcome Measure Help needed turning from your back to your side while in a flat bed without using bedrails?: A Lot Help needed moving from lying on your back to sitting on the side of a flat bed without using bedrails?: A Lot Help needed moving to and from a bed to a chair (including a wheelchair)?: A Lot Help needed standing up from a chair using your arms (e.g., wheelchair or bedside chair)?: A Lot Help needed to walk in hospital room?: A Lot Help needed climbing 3-5 steps with a railing? : Total 6 Click Score: 11    End of Session Equipment Utilized During Treatment: Gait belt Activity Tolerance: Treatment limited secondary to medical complications (Comment) (tachycardia) Patient left: in bed;with call bell/phone within reach;with bed alarm set Nurse Communication: Mobility status PT Visit Diagnosis: Muscle weakness (generalized) (M62.81);Unsteadiness on feet (R26.81);Difficulty in walking, not elsewhere classified (R26.2)    Time: [id number] PT Time Calculation (min) (ACUTE ONLY): 17 min   Charges:   PT Evaluation $  PT Eval Moderate Complexity: 1 Mod          Marisa Severin, PT, DPT Acute Rehabilitation Services Pager [phone] Office [phone]      [place] 02/13/2020, 3:10 PM

## 2020-02-13 NOTE — Progress Notes (Addendum)
PROGRESS NOTE    Chris Braun  FTD:[id number] DOB: 1935-11-15 DOA: 02/10/2020 PCP: Gordy Councilman, FNP   Brief Narrative:  Chris Braun is a 84 y.o. male with history of CAD status post stenting, status post ablation for ventricular arrhythmia, history of atrial tachycardia, COPD, memory problems was brought to the ER at [facility] from [facility] as a direct transfer after patient had gone to [place] ER on February 09, 2019 1 in the evening after patient had sudden food impaction after eating.  Since there was no gastroenterologist over there patient was transferred to [facility].  This afternoon patient was electively intubated for EGD and had EGD done by Dr. Adela Lank following which the food was disimpacted but per Dr. Adela Lank patient had a difficult procedure.  Following which patient was extubated.  Patient also briefly was an atrial tachycardia requiring metoprolol 5 mg IV following which heart rate improved to the 80s sinus rhythm.  After the procedure patient has been having bouts of productive cough for which chest x-ray was done showing possible infiltrates.  Patient was afebrile and not hypoxic.  Gastroenterology Dr. Adela Lank likes to patient be observed overnight given the brief episodes of atrial tachycardia not sure patient can still be completely able to swallow. Labs noted largely unremarkable.  EKG shows atrial tachycardia Covid test was negative.  ED Course: Labs done in the ER was showing WBC count of 11.6 otherwise unremarkable.  Covid test was negative.  EKG shows atrial tachycardia.  Atrial tachycardia improved with 1 dose of metoprolol 5 mg IV.  See history of present illness for further details.  Patient at the time of my exam is post extubation is able to talk well denies any chest pain.  Has been having productive cough no active wheezing.  Not hypoxic.  Assessment & Plan:   Food impaction: -status post EGD-4 has been disimpacted on  12/24 by GI. -EGD findings noted that he has significant edema at the site of food impaction-unable to appreciate stenosis, tortuous esophagus with very large hiatal hernia -Continue PPI twice daily and Carafate 10 cc p.o. every 6 hours as needed -Continue with clear liquid diet today, soft diet tomorrow as tolerated -Appreciate GI assistance-GI signed off.  Acute hypoxic Respiratory failure 2/2 Possible aspiration pneumonia: -Reviewed chest x-ray.  Patient is requiring 2 L of oxygen via nasal cannula.  He is afebrile with leukocytosis of 12.6 -Patient started on Unasyn-continue same for now. -His leukocytosis has resolved.  He is maintaining oxygen saturation on room air. -He is evaluated by speech therapist and recommended dysphagia 1 diet.  Atrial tachycardia: -Patient has history of NSVT/VT status post ablation in 2008 -TSH: WNL, lactic acid: WNL.  1 L bolus given. -10/26: Discussed with on-call cardiology Dr. Allyson Sabal who recommended to change Toprol-XL 25 mg daily to metoprolol tartrate 25 mg twice daily. -Also his digoxin level was noted to be low on admission.  Discussed with pharmacy and was given digoxin 0.125 mg p.o. once along with his usual dose. -Lopressor 5 mg IV once given on 10/26 due to persistent tachycardia. -EKG obtained which shows sinus tachycardia.  Consulted cardiology for further management.  Coronary artery disease status post stent placement in 2003: Patient denies ACS symptoms -Continue aspirin, Plavix, statin and beta-blocker  Restless leg syndrome: Continue Requip  Dementia: Continue Aricept  BPH: Continue Flomax  History of COPD/asthma: No new wheezing noted on exam.  Continue home inhalers  Hyperlipidemia: Continue statin  Thrombocytopenia: Platelet  143.  No signs of active bleeding.   Hypomagnesemia: Replenished.  Repeat magnesium level tomorrow AM.  Goals of care: Discussed with patient's wife on 12/26 regarding CODE STATUS and she wants to keep  patient full code for now.  She also requested for PT/OT consult as she thinks that patient might need SNF/rehab placement as she cannot take care of him at home due to her own medical issues.  DVT prophylaxis: SCD Code Status: Full code Family Communication:  None present at bedside.  Plan of care discussed with patient in length and he verbalized understanding and agreed with it.  I tried to call patient's wife with no response.  Disposition Plan: Home in 1 to 2 days  Consultants:   GI  Cardiology  Procedures:   EGD  Antimicrobials:   Unasyn  Dispo: The patient is from: Home              Anticipated d/c is to: Home              Anticipated d/c date is: 1 day              Patient currently is not medically stable to d/c.    Subjective: Patient seen and examined.  Resting comfortably on the bed.  On room air.  Denies any new complaints.  Denies chest pain, shortness of breath, leg swelling, orthopnea, PND, palpitation.  No acute events overnight.  Objective: Vitals:   02/12/20 2250 02/12/20 2300 02/13/20 0354 02/13/20 0800  BP:  117/77 128/84 131/66  Pulse:  (!) 124 (!) 122 95  Resp:  20 19 18   Temp:   97.6 F (36.4 C) 97.9 F (36.6 C)  TempSrc:   Oral Oral  SpO2: 94% 95% 95% 93%  Weight:      Height:        Intake/Output Summary (Last 24 hours) at 02/13/2020 1253 Last data filed at 02/13/2020 0824 Gross per 24 hour  Intake 120 ml  Output 600 ml  Net -480 ml   Filed Weights   02/10/20 1249 02/10/20 2221  Weight: 70.9 kg 68.8 kg    Examination:  General exam: Appears calm and comfortable, elderly, on room air  respiratory system: Clear to auscultation. Respiratory effort normal. Cardiovascular system: Tachycardic, no JVD, murmurs, rubs, gallops or clicks. No pedal edema. Gastrointestinal system: Abdomen is nondistended, soft and nontender. No organomegaly or masses felt. Normal bowel sounds heard. Central nervous system: Alert and oriented. No focal  neurological deficits. Extremities: Symmetric 5 x 5 power. Skin: No rashes, lesions or ulcers  Data Reviewed: I have personally reviewed following labs and imaging studies  CBC: Recent Labs  Lab 02/10/20 1345 02/11/20 0115 02/12/20 0251  WBC 11.6* 12.6* 9.3  NEUTROABS 9.1*  --   --   HGB 15.2 13.4 12.8*  HCT 44.0 38.0* 37.9*  MCV 92.1 90.0 91.8  PLT 164 143* [id number]*   Basic Metabolic Panel: Recent Labs  Lab 02/10/20 1345 02/11/20 0115 02/12/20 0251 02/13/20 0135  NA 142 141 138  --   K 3.7 4.1 3.7  --   CL 102 103 104  --   CO2 27 25 23   --   GLUCOSE 95 92 75  --   BUN 14 13 13   --   CREATININE 0.99 0.97 0.92  --   CALCIUM 9.2 8.3* 7.8*  --   MG  --   --  1.5* 1.8   GFR: Estimated Creatinine Clearance: 53.8 mL/min (by C-G formula  based on SCr of 0.92 mg/dL). Liver Function Tests: No results for input(s): AST, ALT, ALKPHOS, BILITOT, PROT, ALBUMIN in the last 168 hours. No results for input(s): LIPASE, AMYLASE in the last 168 hours. No results for input(s): AMMONIA in the last 168 hours. Coagulation Profile: No results for input(s): INR, PROTIME in the last 168 hours. Cardiac Enzymes: No results for input(s): CKTOTAL, CKMB, CKMBINDEX, TROPONINI in the last 168 hours. BNP (last 3 results) No results for input(s): PROBNP in the last 8760 hours. HbA1C: No results for input(s): HGBA1C in the last 72 hours. CBG: No results for input(s): GLUCAP in the last 168 hours. Lipid Profile: No results for input(s): CHOL, HDL, LDLCALC, TRIG, CHOLHDL, LDLDIRECT in the last 72 hours. Thyroid Function Tests: Recent Labs    02/11/20 0538  TSH 0.632   Anemia Panel: No results for input(s): VITAMINB12, FOLATE, FERRITIN, TIBC, IRON, RETICCTPCT in the last 72 hours. Sepsis Labs: Recent Labs  Lab 02/11/20 0538 02/11/20 0709  LATICACIDVEN 0.7 0.7    Recent Results (from the past 240 hour(s))  Resp Panel by RT-PCR (Flu A&B, Covid) Nasopharyngeal Swab     Status: None    Collection Time: 02/10/20  1:45 PM   Specimen: Nasopharyngeal Swab; Nasopharyngeal(NP) swabs in vial transport medium  Result Value Ref Range Status   SARS Coronavirus 2 by RT PCR NEGATIVE NEGATIVE Final    Comment: (NOTE) SARS-CoV-2 target nucleic acids are NOT DETECTED.  The SARS-CoV-2 RNA is generally detectable in upper respiratory specimens during the acute phase of infection. The lowest concentration of SARS-CoV-2 viral copies this assay can detect is 138 copies/mL. A negative result does not preclude SARS-Cov-2 infection and should not be used as the sole basis for treatment or other patient management decisions. A negative result may occur with  improper specimen collection/handling, submission of specimen other than nasopharyngeal swab, presence of viral mutation(s) within the areas targeted by this assay, and inadequate number of viral copies(<138 copies/mL). A negative result must be combined with clinical observations, patient history, and epidemiological information. The expected result is Negative.  Fact Sheet for Patients:  EntrepreneurPulse.com.au  Fact Sheet for Healthcare Providers:  IncredibleEmployment.be  This test is no t yet approved or cleared by the [place] FDA and  has been authorized for detection and/or diagnosis of SARS-CoV-2 by FDA under an Emergency Use Authorization (EUA). This EUA will remain  in effect (meaning this test can be used) for the duration of the COVID-19 declaration under Section 564(b)(1) of the Act, 21 U.S.C.section 360bbb-3(b)(1), unless the authorization is terminated  or revoked sooner.       Influenza A by PCR NEGATIVE NEGATIVE Final   Influenza B by PCR NEGATIVE NEGATIVE Final    Comment: (NOTE) The Xpert Xpress SARS-CoV-2/FLU/RSV plus assay is intended as an aid in the diagnosis of influenza from Nasopharyngeal swab specimens and should not be used as a sole basis for treatment.  Nasal washings and aspirates are unacceptable for Xpert Xpress SARS-CoV-2/FLU/RSV testing.  Fact Sheet for Patients: EntrepreneurPulse.com.au  Fact Sheet for Healthcare Providers: IncredibleEmployment.be  This test is not yet approved or cleared by the [place] FDA and has been authorized for detection and/or diagnosis of SARS-CoV-2 by FDA under an Emergency Use Authorization (EUA). This EUA will remain in effect (meaning this test can be used) for the duration of the COVID-19 declaration under Section 564(b)(1) of the Act, 21 U.S.C. section 360bbb-3(b)(1), unless the authorization is terminated or revoked.  Performed at [facility]  Lab, [address] [place], [place] [address]       Radiology Studies: No results found.  Scheduled Meds: . atorvastatin  80 mg Oral QHS  . calcitonin (salmon)  1 spray Alternating Nares QHS  . clopidogrel  75 mg Oral Daily  . digoxin  0.125 mg Oral QODAY  . donepezil  5 mg Oral QHS  . metoprolol tartrate  25 mg Oral BID  . mometasone-formoterol  2 puff Inhalation BID  . pantoprazole (PROTONIX) IV  40 mg Intravenous Q12H  . rOPINIRole  0.5 mg Oral TID  . sucralfate  1 g Oral TID WC & HS  . tamsulosin  0.4 mg Oral QPC supper  . vitamin B-12  2,000 mcg Oral Daily   Continuous Infusions: . ampicillin-sulbactam (UNASYN) IV 3 g (02/13/20 1156)     LOS: 2 days   Time spent: 30 minutes   Raphael Fitzpatrick Loann Quill, MD [facility]  If 7PM-7AM, please contact night-coverage [email or web address] 02/13/2020, 12:53 PM

## 2020-02-13 NOTE — Consult Note (Addendum)
Cardiology Consultation:   Patient ID: Chris Braun MRN: [id number]; DOB: Jul 13, 1935  Admit date: 02/10/2020 Date of Consult: 02/13/2020  Primary Care Provider: Penelope Coop, [facility] Cardiologist: Jenean Lindau, MD  [facility] Electrophysiologist:  Will Meredith Leeds, MD    Patient Profile:   Chris Braun is a 84 y.o. male with a hx of  CAD s/p MI and stenting in 2003, history of NSVT/VT s/p ablation at [facility] in 2008, HTN, HLD, carotid artery disease, COPD and history of CVA who is being seen today for the evaluation of paroxysmal atrial tachycardia versus SVT at the request of Dr. Doristine Bosworth.  History of Present Illness:   Chris Braun is a 84 year old male with past medical history of CAD s/p MI and stenting in 2003, history of NSVT/VT s/p ablation at [facility] in 2008, HTN, HLD, carotid artery disease, COPD and history of CVA.  Myoview obtained in July 2020 showed EF 57%, low risk study.  Echocardiogram around the same time showed EF 50 to 55%, basal and mid inferolateral wall, basal inferoseptal segment, and basal inferior hypokinesis.  Previous heart monitor in 2020 revealed SVT and atrial tachycardia.  Patient was evaluated by EP service in September and the Toprol-XL was increased.  Multiple TSH obtained in October and December 2021 all normal.  Patient was last seen in October 2021 by Dr. Tamala Julian during admission for weakness and confusion.  Initially, cardiology service was consulted for possible A. fib, however on closer examination, patient does not have A. fib, instead this is likely paroxysmal SVT versus atrial tachycardia.  Echocardiogram obtained on 12/10/2019 revealed EF 50 to 55%, hypokinesis of the basal to mid inferior and inferolateral myocardium, mild MR.  Patient was eventually discharged on Toprol-XL.  On follow-up with Dr. Geraldo Pitter on 11/5, digoxin 0.125 mg was added every other day.  His last visit with Dr. Geraldo Pitter was on 01/06/2020 at which time  he noted significant improvement after starting on the digoxin.  Patient was readmitted on 02/09/2020 as a direct transfer [facility] due to sudden food impaction after eating.  He underwent elective intubation for EGD which disimpacted the food particle.  After the procedure, patient was noted to have frequent atrial tachycardia prompting for admission.  He was also treated for acute hypoxic respiratory failure secondary to possible aspiration pneumonia.  25 mg daily of Toprol-XL has been switched to 25 mg twice daily of metoprolol tartrate.  Digoxin level was low.  Cardiology service has been consulted for management of paroxysmal atrial tachycardia.   Past Medical History:  Diagnosis Date  . Acute hypoxemic respiratory failure ([place]) 05/27/2018  . AKI (acute kidney injury) ([place]) 05/27/2018  . Asthma   . Atrial tachycardia ([place]) 07/26/2018  . B12 deficiency   . CAD (coronary artery disease) 08/24/2014  . Carotid stenosis, bilateral 08/24/2014  . Cataracts, bilateral   . Compression fracture    BAck  . Compression fracture of L2 lumbar vertebra, closed, initial encounter ([place]) 10/27/2019  . COPD (chronic obstructive pulmonary disease) ([place])   . Depression   . Diverticulosis    per colonscopy  . Dyslipidemia 08/24/2014  . Falls frequently 06/03/2015  . Gait abnormality 01/19/2018  . GERD (gastroesophageal reflux disease)   . Hiatal hernia   . High cholesterol   . Hypertension   . Hypoxia   . Idiopathic progressive polyneuropathy   . Kidney stones   . Lumbar vertebral fracture, pathologic 10/27/2019  . Macular degeneration   .  Memory disorder 01/19/2018  . Myocardial infarction ([place]) 2002  . 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 Osteoporosis   . Right kidney stone 05/27/2018  . Right upper lobe pneumonia 05/27/2018  . Risk for falls 05/28/2015  . Status post ablation of ventricular arrhythmia 04/27/2018  . Stroke (cerebrum) ([place]) 01/17/2014  . Stroke ([place])   . SVT  (supraventricular tachycardia) ([place]) 12/09/2019  . Weakness of distal arms and legs 06/03/2015    Past Surgical History:  Procedure Laterality Date  . CARDIAC CATHETERIZATION  2002,   stents   . CATARACT EXTRACTION Left 2016  . Colonscopy  2012  . CYSTOSCOPY W/ URETERAL STENT PLACEMENT Right 05/30/2018   Procedure: CYSTOSCOPY WITH RETROGRADE PYELOGRAM/URETERAL STENT PLACEMENT;  Surgeon: Irine Seal, MD;  Location: [place];  Service: Urology;  Laterality: Right;  . ESOPHAGOGASTRODUODENOSCOPY (EGD) WITH PROPOFOL N/A 02/10/2020   Procedure: ESOPHAGOGASTRODUODENOSCOPY (EGD) WITH PROPOFOL;  Surgeon: Yetta Flock, MD;  Location: [place];  Service: Gastroenterology;  Laterality: N/A;  . FOREIGN BODY REMOVAL  02/10/2020   Procedure: FOREIGN BODY REMOVAL;  Surgeon: Yetta Flock, MD;  Location: [facility] ENDOSCOPY;  Service: Gastroenterology;;  . HIP FRACTURE SURGERY  2008   right  . HYDROCELE EXCISION  10/12/2014  . LITHOTRIPSY     2007, 2009, 2010, 2011, 2012, 2013  . LUMBAR LAMINECTOMY/DECOMPRESSION MICRODISCECTOMY  03/15/2012   Procedure: LUMBAR LAMINECTOMY/DECOMPRESSION MICRODISCECTOMY 1 LEVEL;  Surgeon: Elaina Hoops, MD;  Location: [place] NEURO ORS;  Service: Neurosurgery;  Laterality: Left;  Left lumbar three-four decompressive lumbar laminectomy, discectomy  . MAXIMUM ACCESS (MAS)POSTERIOR LUMBAR INTERBODY FUSION (PLIF) 2 LEVEL N/A 12/08/2012   Procedure: FOR MAXIMUM ACCESS (MAS) POSTERIOR LUMBAR INTERBODY FUSION (PLIF) 2 LEVEL;  Surgeon: Elaina Hoops, MD;  Location: [place] NEURO ORS;  Service: Neurosurgery;  Laterality: N/A;  FOR MAXIMUM ACCESS (MAS) POSTERIOR LUMBAR INTERBODY FUSION (PLIF) 2 LEVEL  . ROTATOR CUFF REPAIR  1999   bil   . [place]  . TOTAL HIP ARTHROPLASTY Right 2008  . VT study     with Ablation     Home Medications:  Prior to Admission medications   Medication Sig Start Date End Date Taking? Authorizing Provider  Acetaminophen 325 MG CAPS  Take 975 mg by mouth every 8 (eight) hours as needed (pain).    Yes [provider]  albuterol (PROVENTIL HFA;VENTOLIN HFA) 108 (90 BASE) MCG/ACT inhaler Inhale 2 puffs into the lungs every 6 (six) hours as needed for wheezing.   Yes [provider]  aspirin EC 81 MG tablet Take 81 mg by mouth at bedtime.    Yes [provider]  atorvastatin (LIPITOR) 80 MG tablet Take 80 mg by mouth at bedtime.    Yes [provider]  benzonatate (TESSALON) 200 MG capsule Take 200 mg by mouth in the morning, at noon, and at bedtime.  11/19/19  Yes [provider]  calcitonin, salmon, (MIACALCIN/FORTICAL) 200 UNIT/ACT nasal spray Place 1 spray into alternate nostrils at bedtime.    Yes [provider]  Calcium Carb-Cholecalciferol (CALCIUM-VITAMIN D3) 600-200 MG-UNIT TABS Take 600 mg by mouth 2 (two) times daily.   Yes [provider]  Cholecalciferol (VITAMIN D) 50 MCG (2000 UT) CAPS Take by mouth.   Yes [provider]  clopidogrel (PLAVIX) 75 MG tablet Take 75 mg by mouth daily. 06/20/15  Yes [provider]  digoxin (LANOXIN) 0.125 MG tablet Take 1 tablet (0.125 mg total) by mouth  every other day. 12/23/19  Yes Revankar, Reita Cliche, MD  donepezil (ARICEPT) 5 MG tablet Take 5 mg by mouth at bedtime.  11/05/15  Yes [provider]  Ferrous Gluconate (IRON 27 PO) Take 27 mg by mouth every evening.   Yes [provider]  guaiFENesin (MUCINEX) 600 MG 12 hr tablet Take 1,200 mg by mouth 2 (two) times daily.    Yes [provider]  ipratropium-albuterol (DUONEB) 0.5-2.5 (3) MG/3ML SOLN Take 3 mLs by nebulization 2 (two) times daily.    Yes [provider]  ketoconazole (NIZORAL) 2 % cream Apply 1 application topically daily as needed for irritation (on feet). Apply as needed to affected area.   04/25/19  Yes [provider]  lactulose (CHRONULAC) 10 GM/15ML solution Take 10 g by mouth at bedtime as needed  for mild constipation.    Yes [provider]  loperamide (IMODIUM) 2 MG capsule Take 2-4 mg by mouth as needed for diarrhea or loose stools.   Yes [provider]  loratadine (CLARITIN) 10 MG tablet Take 10 mg by mouth in the morning.    Yes [provider]  metoprolol succinate (TOPROL-XL) 25 MG 24 hr tablet Take 1 tablet (25 mg total) by mouth daily. 12/11/19 01/10/20 Yes Alexandria Lodge, MD  Misc Natural Products (LUTEIN 20) CAPS Take 20 mg by mouth in the morning.   Yes [provider]  montelukast (SINGULAIR) 10 MG tablet Take 10 mg by mouth daily.    Yes [provider]  Multiple Vitamins-Minerals (MULTIVITAMIN PO) Take 1 tablet by mouth every evening.    Yes [provider]  nitroGLYCERIN (NITROSTAT) 0.4 MG SL tablet DISSOLVE 1 TABLET UNDER THE TONGUE EVERY 5 MINUTES AS NEEDED FOR CHEST PAIN. DO NOT EXCEED A TOTAL OF 3 DOSES IN 15 MINUTES. Patient taking differently: Place 0.4 mg under the tongue every 5 (five) minutes x 3 doses as needed for chest pain. 06/07/18  Yes Revankar, Reita Cliche, MD  ondansetron (ZOFRAN-ODT) 4 MG disintegrating tablet Take 4 mg by mouth every 8 (eight) hours as needed for nausea or vomiting.  12/07/19  Yes [provider]  pantoprazole (PROTONIX) 40 MG tablet Take 40 mg by mouth daily before breakfast.    Yes [provider]  potassium chloride (KLOR-CON) 10 MEQ tablet Take 10 mEq by mouth daily as needed (take with lasix).   Yes [provider]  rOPINIRole (REQUIP) 0.5 MG tablet Take 0.5 mg by mouth in the morning, at noon, and at bedtime.  09/27/16  Yes [provider]  SYMBICORT 160-4.5 MCG/ACT inhaler Inhale 2 puffs into the lungs 2 (two) times daily. 07/21/16  Yes [provider]  tamsulosin (FLOMAX) 0.4 MG CAPS capsule Take 0.4 mg by mouth daily after supper.   Yes [provider]  traMADol (ULTRAM) 50 MG tablet Take 50 mg by mouth every 6 (six) hours as needed  for moderate pain.  11/23/19  Yes [provider]  verapamil (CALAN) 40 MG tablet Take one tablet twice daily as needed for heart rate greater than 90. 12/14/19  Yes Revankar, Reita Cliche, MD  vitamin B-12 (CYANOCOBALAMIN) 1000 MCG tablet Take 2,000 mcg by mouth daily.   Yes [provider]  methocarbamol (ROBAXIN) 500 MG tablet Take 1 tablet (500 mg total) by mouth 3 (three) times daily. Patient not taking: Reported on 02/10/2020 10/31/19   Geradine Girt, DO  polyethylene glycol (MIRALAX / GLYCOLAX) 17 g packet Take 17 g by  mouth daily. Patient not taking: Reported on 02/10/2020 05/31/18   Glade Lloyd, MD    Inpatient Medications: Scheduled Meds: . atorvastatin  80 mg Oral QHS  . calcitonin (salmon)  1 spray Alternating Nares QHS  . clopidogrel  75 mg Oral Daily  . digoxin  0.125 mg Oral QODAY  . donepezil  5 mg Oral QHS  . metoprolol tartrate  25 mg Oral BID  . mometasone-formoterol  2 puff Inhalation BID  . pantoprazole (PROTONIX) IV  40 mg Intravenous Q12H  . rOPINIRole  0.5 mg Oral TID  . sucralfate  1 g Oral TID WC & HS  . tamsulosin  0.4 mg Oral QPC supper  . vitamin B-12  2,000 mcg Oral Daily   Continuous Infusions: . ampicillin-sulbactam (UNASYN) IV 3 g (02/13/20 1156)   PRN Meds: nitroGLYCERIN  Allergies:    Allergies  Allergen Reactions  . Other Nausea And Vomiting and Other (See Comments)    pneumonia vaccine- chills, vomiting, fever, lost use of legs and body function. Had a fall post inj. (05/25/2013)  . Pneumovax [Pneumococcal Polysaccharide Vaccine] Nausea And Vomiting and Other (See Comments)    Chills, loss of bodily functions, ended up in the ED  . Ibuprofen Other (See Comments)    Heart doctor advised he cannot take this  . Norco [Hydrocodone-Acetaminophen]     Must have Zofran to tolerate this  . Oxycodone Other (See Comments)    Mental status changes and causes sleepwalking    Social History:   Social History   Socioeconomic History   . Marital status: Married    Spouse name: Asai Vantrease  . Number of children: 4  . Years of education: 59  . Highest education level: Not on file  Occupational History  . Occupation: Retired  Tobacco Use  . Smoking status: Former Smoker    Packs/day: 1.00    Years: 45.00    Pack years: 45.00    Types: Cigarettes    Quit date: 02/02/2001    Years since quitting: 19.0  . Smokeless tobacco: Current User    Types: Chew  Vaping Use  . Vaping Use: Never used  Substance and Sexual Activity  . Alcohol use: No  . Drug use: No  . Sexual activity: Not on file  Other Topics Concern  . Not on file  Social History Narrative   Lives with wife   Caffeine use: coffee, tea   Social Determinants of Health   Financial Resource Strain: Not on file  Food Insecurity: Not on file  Transportation Needs: Not on file  Physical Activity: Not on file  Stress: Not on file  Social Connections: Not on file  Intimate Partner Violence: Not on file    Family History:    Family History  Problem Relation Age of Onset  . Hemolytic uremic syndrome Mother   . Heart disease Mother   . Stroke Father   . Heart disease Father   . Heart disease Sister   . Heart disease Sister   . Heart disease Sister   . Alzheimer's disease Sister      ROS:  Please see the history of present illness.   All other ROS reviewed and negative.     Physical Exam/Data:   Vitals:   02/12/20 2250 02/12/20 2300 02/13/20 0354 02/13/20 0800  BP:  117/77 128/84 131/66  Pulse:  (!) 124 (!) 122 95  Resp:  20 19 18   Temp:   97.6 F (36.4 C) 97.9 F (  36.6 C)  TempSrc:   Oral Oral  SpO2: 94% 95% 95% 93%  Weight:      Height:        Intake/Output Summary (Last 24 hours) at 02/13/2020 1322 Last data filed at 02/13/2020 0824 Gross per 24 hour  Intake 120 ml  Output 600 ml  Net -480 ml   Last 3 Weights 02/10/2020 02/10/2020 01/06/2020  Weight (lbs) 151 lb 10.8 oz 156 lb 4.9 oz 156 lb 6.4 oz  Weight (kg) 68.8 kg  70.9 kg 70.943 kg     Body mass index is 25.24 kg/m.  General:  Well nourished, well developed, in no acute distress HEENT: normal Lymph: no adenopathy Neck: no JVD Endocrine:  No thryomegaly Vascular: No carotid bruits; FA pulses 2+ bilaterally without bruits  Cardiac:  normal S1, S2; tachycardic; no murmur  Lungs:  clear to auscultation bilaterally, no wheezing, rhonchi or rales  Abd: soft, nontender, no hepatomegaly  Ext: no edema Musculoskeletal:  No deformities, BUE and BLE strength normal and equal Skin: warm and dry  Neuro:  CNs 2-12 intact, no focal abnormalities noted Psych:  Normal affect   EKG:  The EKG was personally reviewed and demonstrates: Atrial tachycardia, nonspecific ST-T wave changes Telemetry:  Telemetry was personally reviewed and demonstrates: Sinus rhythm with frequent episode of atrial tachycardia.  Relevant CV Studies:  Echo 12/10/2019 1. Hypokinesis of the basal to mid inferior and inferolateral myocardium.  Left ventricular ejection fraction, by estimation, is 50 to 55%. The left  ventricle has low normal function. The left ventricle demonstrates  regional wall motion abnormalities  (see scoring diagram/findings for description).  2. Right ventricular systolic function is normal. The right ventricular  size is normal.  3. The mitral valve is normal in structure. Mild mitral valve  regurgitation. No evidence of mitral stenosis.  4. The aortic valve is normal in structure. Aortic valve regurgitation is  not visualized. No aortic stenosis is present.  5. The inferior vena cava is normal in size with greater than 50%  respiratory variability, suggesting right atrial pressure of 3 mmHg.   Laboratory Data:  High Sensitivity Troponin:   Recent Labs  Lab 02/11/20 0538 02/11/20 0709  TROPONINIHS 22* 21*     Chemistry Recent Labs  Lab 02/10/20 1345 02/11/20 0115 02/12/20 0251  NA 142 141 138  K 3.7 4.1 3.7  CL 102 103 104  CO2 27 25 23    GLUCOSE 95 92 75  BUN 14 13 13   CREATININE 0.99 0.97 0.92  CALCIUM 9.2 8.3* 7.8*  GFRNONAA >60 >60 >60  ANIONGAP 13 13 11     No results for input(s): PROT, ALBUMIN, AST, ALT, ALKPHOS, BILITOT in the last 168 hours. Hematology Recent Labs  Lab 02/10/20 1345 02/11/20 0115 02/12/20 0251  WBC 11.6* 12.6* 9.3  RBC 4.78 4.22 4.13*  HGB 15.2 13.4 12.8*  HCT 44.0 38.0* 37.9*  MCV 92.1 90.0 91.8  MCH 31.8 31.8 31.0  MCHC 34.5 35.3 33.8  RDW 14.2 14.0 13.4  PLT 164 143* 143*   BNPNo results for input(s): BNP, PROBNP in the last 168 hours.  DDimer No results for input(s): DDIMER in the last 168 hours.  Lipid Panel  No results found for: CHOL, TRIG, HDL, CHOLHDL, VLDL, LDLCALC, LDLDIRECT, LABVLDL  Radiology/Studies:  DG CHEST PORT 1 VIEW  Result Date: 02/10/2020 CLINICAL DATA:  Aspiration into airway. EXAM: PORTABLE CHEST 1 VIEW COMPARISON:  January 11, 2020 FINDINGS: Decreased lung volumes are seen, likely secondary to the  degree of patient inspiration. Moderate severity diffuse chronic appearing increased interstitial lung markings are seen. Mild atelectasis and/or infiltrate is noted within the left lung base. There is no evidence of a pleural effusion or pneumothorax. The heart size and mediastinal contours are within normal limits. Marked severity calcification of the aortic arch is seen. There is moderate severity dextroscoliosis of the thoracic spine with evidence of prior vertebroplasty seen within the mid and lower thoracic spine. IMPRESSION: Chronic appearing increased interstitial lung markings with mild left basilar atelectasis and/or infiltrate. Electronically Signed   By: Virgina Norfolk M.D.   On: 02/10/2020 21:23     Assessment and Plan:   1. Atrial tachycardia  -Patient was previously seen by Dr. Tamala Julian during last hospitalization in October for the same thing.  She was placed on 25 mg daily of Toprol-XL at the time.  Since discharge, Dr. Geraldo Pitter, patient's primary  cardiologist placed him on 0.125 mg every other day of digoxin.  -Patient was admitted for food impaction, this has been disimpacted since then.  During the hospitalization, she has frequent bursts of atrial tachycardia versus SVT.  Toprol-XL has been switched to metoprolol tartrate 25 mg twice daily  -We will discuss with MD, given his blood pressure, I think he can likely tolerate 25 mg 3 times daily dosing of metoprolol tartrate. Add low dose of Verampil daily  -We will need to be seen again by Dr. Curt Bears as outpatient, question if another ablation procedure may be helpful in future  2. Food impaction: Status post EGD and disimpaction.  Tolerating food with regular bowel movement.  3. CAD s/p remote MI and stenting in 2003: Denies any recent chest pain  4. History of VT s/p ablation at [facility] in 2018  5. Hypertension  6. Hyperlipidemia  7. Carotid artery disease  8. COPD  9. History of CVA     For questions or updates, please contact [place] Please consult [email or web address] for contact info under    Signed, Almyra Deforest, [place]  02/13/2020 1:22 PM     Patient seen and examined. Agree with assessment and plan.  Chris Braun is an 84 year old gentleman who has known CAD, status post MI and underwent stenting in 2003.  He has a history of nonsustained VT/VT and underwent ablation at [facility] in 2008.  In addition he has a history of hypertension, hyperlipidemia, carotid disease COPD and remote CVA.  In the past he has been demonstrated to have episodes of possible SVT versus atrial tachycardia.  An echo Doppler study in October 2021 showed an EF of 50 to 55% with inferior to inferolateral hypokinesis, and mild MR. Recently admitted in transfer from [facility] due to sudden food impaction after eating and required intubation ECG to disimpact the food.  He has had recurrent episodes of possible atrial tachycardia leading to admission.  He is followed by Dr. Geraldo Pitter for his  cardiology care.  Presently, the patient denies any episodes of chest pain.  He is unaware of any fast heartbeat.  However, when I went into the room, the patient's was in a regular tachycardia rhythm with ventricular rate at 128 - 130 suggesting either possible atrial tachycardia versus 2-1 atrial flutter, P waves were present arguing against AV nodal reentrant tachycardia.  He is entirely asymptomatic.  HEENT was unremarkable.  There was no JVD.  He had decreased breath sounds in his lungs without wheezing.  Rhythm was tachycardic and regular with 1/6 systolic murmur.  Abdomen soft nontender.  There was no edema.  With his persistent tachycardia, will initiate IV Cardizem bolus at 10 mg and start Cardizem drip initially at low-dose 5 mg/h.  If heart rate does not slow down further titration at 10 mg/h will be undertaken.  At present we will therefore not start oral verapamil and consider transition to oral Cardizem once Cardizem drip is ultimately discontinued.  We will further increase metoprolol to 25 mg every 8 hours with possible dose titration if necessary.  Potassium 3.7, magnesium 1.8, HS trop 21. TSH 0.632.  He is on atorvastatin 80 mg for hyperlipidemia.  Consider checking fasting lipid panel in a.m.   Troy Sine, MD, [facility] 02/13/2020 3:16 PM

## 2020-02-14 LAB — BASIC METABOLIC PANEL
Anion gap: 12 (ref 5–15)
BUN: 5 mg/dL — ABNORMAL LOW (ref 8–23)
CO2: 24 mmol/L (ref 22–32)
Calcium: 8.1 mg/dL — ABNORMAL LOW (ref 8.9–10.3)
Chloride: 105 mmol/L (ref 98–111)
Creatinine, Ser: 0.89 mg/dL (ref 0.61–1.24)
GFR, Estimated: >60 mL/min (ref >60)
Glucose, Bld: 96 mg/dL (ref 70–99)
Potassium: 3.7 mmol/L (ref 3.5–5.1)
Sodium: 141 mmol/L (ref 135–145)

## 2020-02-14 LAB — MAGNESIUM: Magnesium: 1.7 mg/dL (ref 1.7–2.4)

## 2020-02-14 MED ORDER — MAGNESIUM SULFATE 2 GM/50ML IV SOLN
2.0000 g | Freq: Once | INTRAVENOUS | Status: AC
  Administered 2020-02-14: 08:00:00 2 g via INTRAVENOUS
  Filled 2020-02-14: qty 50

## 2020-02-14 MED ORDER — ORAL CARE MOUTH RINSE
15.0000 mL | Freq: Two times a day (BID) | OROMUCOSAL | Status: DC
  Administered 2020-02-14 – 2020-02-15 (×2): 15 mL via OROMUCOSAL

## 2020-02-14 MED ORDER — DRONEDARONE HCL 400 MG PO TABS: 400.0000 mg | ORAL_TABLET | Freq: Two times a day (BID) | ORAL | Status: DC

## 2020-02-14 MED ORDER — METOPROLOL TARTRATE 25 MG PO TABS
25.0000 mg | ORAL_TABLET | Freq: Two times a day (BID) | ORAL | Status: DC
  Administered 2020-02-14 – 2020-02-16 (×6): 25 mg via ORAL
  Filled 2020-02-14 (×6): qty 1

## 2020-02-14 MED ORDER — DRONEDARONE HCL 400 MG PO TABS
400.0000 mg | ORAL_TABLET | Freq: Two times a day (BID) | ORAL | Status: DC
  Administered 2020-02-14 – 2020-02-16 (×5): 400 mg via ORAL
  Filled 2020-02-14 (×5): qty 1

## 2020-02-14 NOTE — Evaluation (Signed)
Occupational Therapy Evaluation Patient Details Name: Chris Braun MRN: [id number] DOB: January 16, 1936 Today's Date: 02/14/2020    History of Present Illness Patient is a 84 y/o male who presents with food impaction, which was removed 12/24. Also with respiratory failure secondary to possible aspiration PNA and atrial tachycardia. PMH includes sinus tachycardia, NSVT/VT s/p ablation in 2008, CAD with remote MI and stenting in 2003, dyslipidemia, HTN, asthma/COPD, stroke, dementia, severe osteoporosis with multiple thoracic spine compression fractures   Clinical Impression   Pt presents with decline in function and safety with ADLs and ADL mobility with impaired strength, balance and endurance; pt with hx of dementia. Pt's wife present to provide PLOF info. Pt's wife states that prior to August pt was able to use RW more and was able to bathe, dress and toilet himself; pt now requires assist from his wife for these tasks. Pt with tachycardia with HR in 140s at rest and increasing to 160s during activity seated EOB and with transfers to [facility]. Pt would benefit from acute OT services to address impairments to maximize level of function and safety    Follow Up Recommendations  SNF;Supervision/Assistance - 24 hour    Equipment Recommendations  Other (comment) (TBD at next venue of care)    Recommendations for Other Services       Precautions / Restrictions Precautions Precautions: Fall;Other (comment) Precaution Comments: watch HR Restrictions Weight Bearing Restrictions: No      Mobility Bed Mobility Overal bed mobility: Needs Assistance Bed Mobility: Supine to Sit;Sit to Supine     Supine to sit: Mod assist Sit to supine: Min guard   General bed mobility comments: mod A to elevate trunk, min guard A with LEs back onto bed. Pt able to to lateral scoot seated EOB towards HOB with min guard A    Transfers Overall transfer level: Needs assistance Equipment used: Rolling walker (2  wheeled) Transfers: Sit to/from Stand Sit to Stand: Mod assist;+2 physical assistance         General transfer comment: Assist to power to standing with cues for hand placement/technique. Posterior leaning    Balance Overall balance assessment: Needs assistance Sitting-balance support: Feet supported;No upper extremity supported Sitting balance-Leahy Scale: Fair Sitting balance - Comments: Min guard for safety Postural control: Posterior lean Standing balance support: During functional activity Standing balance-Leahy Scale: Poor                             ADL either performed or assessed with clinical judgement   ADL Overall ADL's : Needs assistance/impaired Eating/Feeding: Set up;Supervision/ safety;Sitting;Bed level   Grooming: Wash/dry hands;Wash/dry face;Min guard;Sitting   Upper Body Bathing: Minimal assistance;Sitting   Lower Body Bathing: Maximal assistance;Sitting/lateral leans   Upper Body Dressing : Minimal assistance;Sitting   Lower Body Dressing: Total assistance   Toilet Transfer: Moderate assistance;+2 for safety/equipment;Stand-pivot;BSC   Toileting- Clothing Manipulation and Hygiene: Total assistance       Functional mobility during ADLs: Moderate assistance;Cueing for safety;Cueing for sequencing       Vision Baseline Vision/History: Wears glasses Patient Visual Report: No change from baseline       Perception     Praxis      Pertinent Vitals/Pain Pain Assessment: No/denies pain Pain Score: 0-No pain Pain Intervention(s): Monitored during session     Hand Dominance Right   Extremity/Trunk Assessment Upper Extremity Assessment Upper Extremity Assessment: Generalized weakness   Lower Extremity Assessment Lower Extremity Assessment: Defer to  PT evaluation   Cervical / Trunk Assessment Cervical / Trunk Assessment: Kyphotic   Communication Communication Communication: No difficulties   Cognition Arousal/Alertness:  Awake/alert Behavior During Therapy: WFL for tasks assessed/performed Overall Cognitive Status: Impaired/Different from baseline Area of Impairment: Orientation;Attention;Memory;Following commands;Safety/judgement;Problem solving                 Orientation Level: Disoriented to;Place   Memory: Decreased short-term memory Following Commands: Follows one step commands consistently Safety/Judgement: Decreased awareness of deficits;Decreased awareness of safety   Problem Solving: Difficulty sequencing;Requires verbal cues;Requires tactile cues     General Comments       Exercises     Shoulder Instructions      Home Living Family/patient expects to be discharged to:: Private residence Living Arrangements: Spouse/significant other Available Help at Discharge: Family;Available 24 hours/day Type of Home: House Home Access: Stairs to enter [employer] of Steps: 1 Entrance Stairs-Rails: None Home Layout: One level     Bathroom Shower/Tub: Chief Strategy Officer: Standard     Home Equipment: Emergency planning/management officer - 2 wheels;Bedside commode;Wheelchair - manual          Prior Functioning/Environment Level of Independence: Needs assistance  Gait / Transfers Assistance Needed: Pt reports minimal ambulation with use of RW, uses w/c mostly, performs SPT to w/c/toilet/bed ADL's / Homemaking Assistance Needed: pt's wife reports that prior August 2021 pt was able to bathe, dress and toilet himself and now she has to assist him with all these tasks            OT Problem List: Decreased strength;Impaired balance (sitting and/or standing);Decreased cognition;Decreased knowledge of use of DME or AE;Decreased activity tolerance      OT Treatment/Interventions: Self-care/ADL training;Therapeutic exercise;DME and/or AE instruction;Balance training;Patient/family education    OT Goals(Current goals can be found in the care plan section) Acute Rehab OT  Goals Patient Stated Goal: get better OT Goal Formulation: With patient/family Time For Goal Achievement: 02/28/20 Potential to Achieve Goals: Good ADL Goals Pt Will Perform Grooming: with supervision;with set-up;sitting Pt Will Perform Upper Body Bathing: with supervision;with set-up;sitting Pt Will Perform Lower Body Bathing: with mod assist;sitting/lateral leans Pt Will Perform Upper Body Dressing: with supervision;with set-up;sitting Pt Will Transfer to Toilet: with mod assist;with min assist;stand pivot transfer;bedside commode  OT Frequency: Min 2X/week   Barriers to D/C:            Co-evaluation              AM-PAC OT "6 Clicks" Daily Activity     Outcome Measure Help from another person eating meals?: None Help from another person taking care of personal grooming?: A Little Help from another person toileting, which includes using toliet, bedpan, or urinal?: Total Help from another person bathing (including washing, rinsing, drying)?: A Lot Help from another person to put on and taking off regular upper body clothing?: A Little Help from another person to put on and taking off regular lower body clothing?: Total 6 Click Score: 14   End of Session Equipment Utilized During Treatment: Gait belt;Other (comment) (BSC)  Activity Tolerance: Patient tolerated treatment well Patient left: in bed;with call bell/phone within reach;with bed alarm set;with nursing/sitter in room;with family/visitor present  OT Visit Diagnosis: Unsteadiness on feet (R26.81);Other abnormalities of gait and mobility (R26.89);History of falling (Z91.81);Muscle weakness (generalized) (M62.81);Other symptoms and signs involving cognitive function                Time: 9242-6834 OT Time Calculation (min): 29  min Charges:  OT General Charges $OT Visit: 1 Visit OT Evaluation $OT Eval Moderate Complexity: 1 Mod OT Treatments $Self Care/Home Management : 8-22 mins    Britt Bottom 02/14/2020, 12:46 PM

## 2020-02-14 NOTE — Consult Note (Addendum)
ELECTROPHYSIOLOGY CONSULT NOTE    Patient ID: Chris Braun MRN: [id number], DOB/AGE: 1935-08-07 84 y.o.  Admit date: 02/10/2020 Date of Consult: 02/14/2020  Primary Physician: Penelope Coop, FNP Primary Cardiologist: Jenean Lindau, MD  Electrophysiologist: Dr. Curt Bears  Referring Provider: Dr. Claiborne Billings  Patient Profile: Chris Braun is a 84 y.o. male with a history of CAD s/p MI and stenting in 2004, h/o NSVT/VT s/p ablation in [facility] in 2008, HTN, HLD, Carotid artery disease, COPD, and h/o CVA who is being seen today for the evaluation of tachy brady syndrome at the request of Dr. Claiborne Billings.  HPI:  Chris Braun is a 84 y.o. male with medical history as above.   Seen by Dr. Curt Bears 10/2018 for NSVT noted on monitor. BB increased.   Patient was last seen in October 2021 by Dr. Tamala Julian during admission for weakness and confusion.  Initially, cardiology service was consulted for possible A. fib, however on closer examination, patient does not have A. fib, instead this is likely paroxysmal SVT versus atrial tachycardia.  Echocardiogram obtained on 12/10/2019 revealed EF 50 to 55%, hypokinesis of the basal to mid inferior and inferolateral myocardium, mild MR.  Patient was eventually discharged on Toprol-XL.  On follow-up with Dr. Geraldo Pitter on 11/5, digoxin 0.125 mg was added every other day.  His last visit with Dr. Geraldo Pitter was on 01/06/2020 at which time he noted significant improvement after starting on the digoxin.  PT readmitted 12/23 as a direct transfer from [facility] due to sudden food impaction after eating. He underwent elective intubation for EGD which disimpacted the food particle. After the procedure, noted to have frequent atrial tachycardia prompting and was treated to acute hypoxic respiratory failure secondary to possible aspiration PNA.   Cardiology consulted 12/27 for paroxysmal SVT. Increase metoprolol and ordered for cardizem gtt. Prior to gtt being started  occasional 2-3 second pause noted so gtt cancelled and lopressor stopped. EP consulted with tachy + pauses.   Pt is feeling OK this am. He actually had an episode of conversion (without significant pause) during my exam and was asymptomatic. Denies tachypalpitations. No chronic or recent history of syncope, near syncope, lightheadedness or dizziness. No chest pain. No orthopnea or SOB. He is anxious to get home  Past Medical History:  Diagnosis Date  . Acute hypoxemic respiratory failure ([place]) 05/27/2018  . AKI (acute kidney injury) ([place]) 05/27/2018  . Asthma   . Atrial tachycardia ([place]) 07/26/2018  . B12 deficiency   . CAD (coronary artery disease) 08/24/2014  . Carotid stenosis, bilateral 08/24/2014  . Cataracts, bilateral   . Compression fracture    BAck  . Compression fracture of L2 lumbar vertebra, closed, initial encounter ([place]) 10/27/2019  . COPD (chronic obstructive pulmonary disease) ([place])   . Depression   . Diverticulosis    per colonscopy  . Dyslipidemia 08/24/2014  . Falls frequently 06/03/2015  . Gait abnormality 01/19/2018  . GERD (gastroesophageal reflux disease)   . Hiatal hernia   . High cholesterol   . Hypertension   . Hypoxia   . Idiopathic progressive polyneuropathy   . Kidney stones   . Lumbar vertebral fracture, pathologic 10/27/2019  . Macular degeneration   . Memory disorder 01/19/2018  . Myocardial infarction ([place]) 2002  . 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 Osteoporosis   . Right kidney stone 05/27/2018  . Right upper lobe pneumonia 05/27/2018  . Risk for falls 05/28/2015  . Status post ablation of ventricular  arrhythmia 04/27/2018  . Stroke (cerebrum) ([place]) 01/17/2014  . Stroke ([place])   . SVT (supraventricular tachycardia) ([place]) 12/09/2019  . Weakness of distal arms and legs 06/03/2015     Surgical History:  Past Surgical History:  Procedure Laterality Date  . CARDIAC CATHETERIZATION  2002,   stents   . CATARACT EXTRACTION Left 2016  . Colonscopy  2012   . CYSTOSCOPY W/ URETERAL STENT PLACEMENT Right 05/30/2018   Procedure: CYSTOSCOPY WITH RETROGRADE PYELOGRAM/URETERAL STENT PLACEMENT;  Surgeon: Irine Seal, MD;  Location: [place];  Service: Urology;  Laterality: Right;  . ESOPHAGOGASTRODUODENOSCOPY (EGD) WITH PROPOFOL N/A 02/10/2020   Procedure: ESOPHAGOGASTRODUODENOSCOPY (EGD) WITH PROPOFOL;  Surgeon: Yetta Flock, MD;  Location: [place];  Service: Gastroenterology;  Laterality: N/A;  . FOREIGN BODY REMOVAL  02/10/2020   Procedure: FOREIGN BODY REMOVAL;  Surgeon: Yetta Flock, MD;  Location: [facility] ENDOSCOPY;  Service: Gastroenterology;;  . HIP FRACTURE SURGERY  2008   right  . HYDROCELE EXCISION  10/12/2014  . LITHOTRIPSY     2007, 2009, 2010, 2011, 2012, 2013  . LUMBAR LAMINECTOMY/DECOMPRESSION MICRODISCECTOMY  03/15/2012   Procedure: LUMBAR LAMINECTOMY/DECOMPRESSION MICRODISCECTOMY 1 LEVEL;  Surgeon: Elaina Hoops, MD;  Location: [place] NEURO ORS;  Service: Neurosurgery;  Laterality: Left;  Left lumbar three-four decompressive lumbar laminectomy, discectomy  . MAXIMUM ACCESS (MAS)POSTERIOR LUMBAR INTERBODY FUSION (PLIF) 2 LEVEL N/A 12/08/2012   Procedure: FOR MAXIMUM ACCESS (MAS) POSTERIOR LUMBAR INTERBODY FUSION (PLIF) 2 LEVEL;  Surgeon: Elaina Hoops, MD;  Location: [place] NEURO ORS;  Service: Neurosurgery;  Laterality: N/A;  FOR MAXIMUM ACCESS (MAS) POSTERIOR LUMBAR INTERBODY FUSION (PLIF) 2 LEVEL  . ROTATOR CUFF REPAIR  1999   bil   . [place]  . TOTAL HIP ARTHROPLASTY Right 2008  . VT study     with Ablation     Medications Prior to Admission  Medication Sig Dispense Refill Last Dose  . Acetaminophen 325 MG CAPS Take 975 mg by mouth every 8 (eight) hours as needed (pain).    UNK  . albuterol (PROVENTIL HFA;VENTOLIN HFA) 108 (90 BASE) MCG/ACT inhaler Inhale 2 puffs into the lungs every 6 (six) hours as needed for wheezing.   02/09/2020 at Unknown time  . aspirin EC 81 MG tablet Take 81 mg by mouth at  bedtime.    02/09/2020 at Unknown time  . atorvastatin (LIPITOR) 80 MG tablet Take 80 mg by mouth at bedtime.    02/09/2020 at Unknown time  . benzonatate (TESSALON) 200 MG capsule Take 200 mg by mouth in the morning, at noon, and at bedtime.    02/09/2020 at Unknown time  . calcitonin, salmon, (MIACALCIN/FORTICAL) 200 UNIT/ACT nasal spray Place 1 spray into alternate nostrils at bedtime.    02/09/2020 at Unknown time  . Calcium Carb-Cholecalciferol (CALCIUM-VITAMIN D3) 600-200 MG-UNIT TABS Take 600 mg by mouth 2 (two) times daily.   02/09/2020 at Unknown time  . Cholecalciferol (VITAMIN D) 50 MCG (2000 UT) CAPS Take by mouth.   02/09/2020 at Unknown time  . clopidogrel (PLAVIX) 75 MG tablet Take 75 mg by mouth daily.   02/09/2020 at 0830  . digoxin (LANOXIN) 0.125 MG tablet Take 1 tablet (0.125 mg total) by mouth every other day. 30 tablet 3 Past Week at Unknown time  . donepezil (ARICEPT) 5 MG tablet Take 5 mg by mouth at bedtime.    02/09/2020 at Unknown time  . Ferrous Gluconate (IRON 27 PO) Take 27 mg by mouth  every evening.   02/09/2020 at Unknown time  . guaiFENesin (MUCINEX) 600 MG 12 hr tablet Take 1,200 mg by mouth 2 (two) times daily.    02/09/2020 at Unknown time  . ipratropium-albuterol (DUONEB) 0.5-2.5 (3) MG/3ML SOLN Take 3 mLs by nebulization 2 (two) times daily.    02/09/2020 at Unknown time  . ketoconazole (NIZORAL) 2 % cream Apply 1 application topically daily as needed for irritation (on feet). Apply as needed to affected area.     UNK  . lactulose (CHRONULAC) 10 GM/15ML solution Take 10 g by mouth at bedtime as needed for mild constipation.    UNK  . loperamide (IMODIUM) 2 MG capsule Take 2-4 mg by mouth as needed for diarrhea or loose stools.   UNK  . loratadine (CLARITIN) 10 MG tablet Take 10 mg by mouth in the morning.    02/09/2020 at Unknown time  . metoprolol succinate (TOPROL-XL) 25 MG 24 hr tablet Take 1 tablet (25 mg total) by mouth daily. 30 tablet 0 02/09/2020 at 0830   . Misc Natural Products (LUTEIN 20) CAPS Take 20 mg by mouth in the morning.   02/09/2020 at Unknown time  . montelukast (SINGULAIR) 10 MG tablet Take 10 mg by mouth daily.    02/09/2020 at Unknown time  . Multiple Vitamins-Minerals (MULTIVITAMIN PO) Take 1 tablet by mouth every evening.    02/09/2020 at Unknown time  . nitroGLYCERIN (NITROSTAT) 0.4 MG SL tablet DISSOLVE 1 TABLET UNDER THE TONGUE EVERY 5 MINUTES AS NEEDED FOR CHEST PAIN. DO NOT EXCEED A TOTAL OF 3 DOSES IN 15 MINUTES. (Patient taking differently: Place 0.4 mg under the tongue every 5 (five) minutes x 3 doses as needed for chest pain.) 25 tablet 6 UNK  . ondansetron (ZOFRAN-ODT) 4 MG disintegrating tablet Take 4 mg by mouth every 8 (eight) hours as needed for nausea or vomiting.    02/09/2020 at Unknown time  . pantoprazole (PROTONIX) 40 MG tablet Take 40 mg by mouth daily before breakfast.    02/09/2020 at Unknown time  . potassium chloride (KLOR-CON) 10 MEQ tablet Take 10 mEq by mouth daily as needed (take with lasix).   UNK  . rOPINIRole (REQUIP) 0.5 MG tablet Take 0.5 mg by mouth in the morning, at noon, and at bedtime.   3 02/09/2020 at Unknown time  . SYMBICORT 160-4.5 MCG/ACT inhaler Inhale 2 puffs into the lungs 2 (two) times daily.  12 02/09/2020 at Unknown time  . tamsulosin (FLOMAX) 0.4 MG CAPS capsule Take 0.4 mg by mouth daily after supper.   02/09/2020 at Unknown time  . traMADol (ULTRAM) 50 MG tablet Take 50 mg by mouth every 6 (six) hours as needed for moderate pain.    02/09/2020 at Unknown time  . verapamil (CALAN) 40 MG tablet Take one tablet twice daily as needed for heart rate greater than 90. 60 tablet 3 UNK  . vitamin B-12 (CYANOCOBALAMIN) 1000 MCG tablet Take 2,000 mcg by mouth daily.   02/09/2020 at Unknown time  . methocarbamol (ROBAXIN) 500 MG tablet Take 1 tablet (500 mg total) by mouth 3 (three) times daily. (Patient not taking: Reported on 02/10/2020)   Not Taking at Unknown time  . polyethylene glycol  (MIRALAX / GLYCOLAX) 17 g packet Take 17 g by mouth daily. (Patient not taking: Reported on 02/10/2020)   Not Taking at Unknown time    Inpatient Medications:  . atorvastatin  80 mg Oral QHS  . calcitonin (salmon)  1 spray Alternating Nares  QHS  . clopidogrel  75 mg Oral Daily  . digoxin  0.125 mg Oral QODAY  . donepezil  5 mg Oral QHS  . mometasone-formoterol  2 puff Inhalation BID  . pantoprazole (PROTONIX) IV  40 mg Intravenous Q12H  . rOPINIRole  0.5 mg Oral TID  . sucralfate  1 g Oral TID WC & HS  . tamsulosin  0.4 mg Oral QPC supper  . vitamin B-12  2,000 mcg Oral Daily    Allergies:  Allergies  Allergen Reactions  . Other Nausea And Vomiting and Other (See Comments)    pneumonia vaccine- chills, vomiting, fever, lost use of legs and body function. Had a fall post inj. (05/25/2013)  . Pneumovax [Pneumococcal Polysaccharide Vaccine] Nausea And Vomiting and Other (See Comments)    Chills, loss of bodily functions, ended up in the ED  . Ibuprofen Other (See Comments)    Heart doctor advised he cannot take this  . Norco [Hydrocodone-Acetaminophen]     Must have Zofran to tolerate this  . Oxycodone Other (See Comments)    Mental status changes and causes sleepwalking    Social History   Socioeconomic History  . Marital status: Married    Spouse name: Chris Braun  . Number of children: 4  . Years of education: 2  . Highest education level: Not on file  Occupational History  . Occupation: Retired  Tobacco Use  . Smoking status: Former Smoker    Packs/day: 1.00    Years: 45.00    Pack years: 45.00    Types: Cigarettes    Quit date: 02/02/2001    Years since quitting: 19.0  . Smokeless tobacco: Current User    Types: Chew  Vaping Use  . Vaping Use: Never used  Substance and Sexual Activity  . Alcohol use: No  . Drug use: No  . Sexual activity: Not on file  Other Topics Concern  . Not on file  Social History Narrative   Lives with wife   Caffeine use:  coffee, tea   Social Determinants of Health   Financial Resource Strain: Not on file  Food Insecurity: Not on file  Transportation Needs: Not on file  Physical Activity: Not on file  Stress: Not on file  Social Connections: Not on file  Intimate Partner Violence: Not on file     Family History  Problem Relation Age of Onset  . Hemolytic uremic syndrome Mother   . Heart disease Mother   . Stroke Father   . Heart disease Father   . Heart disease Sister   . Heart disease Sister   . Heart disease Sister   . Alzheimer's disease Sister      Review of Systems: All other systems reviewed and are otherwise negative except as noted above.  Physical Exam: Vitals:   02/13/20 2000 02/13/20 2028 02/14/20 0000 02/14/20 0400  BP: 98/65  114/83 109/78  Pulse:      Resp: 18  16 18   Temp: 98.4 F (36.9 C)  98 F (36.7 C) 97.7 F (36.5 C)  TempSrc: Axillary  Axillary Oral  SpO2: 94% 95% 98% 94%  Weight:      Height:        GEN- The patient is elderly and somewhat frail appearing, alert and oriented x 3 today.   HEENT: normocephalic, atraumatic; sclera clear, conjunctiva pink; hearing intact; oropharynx clear; neck supple Lungs- Diminished, normal work of breathing.  No wheezes, rales, rhonchi Heart- Tachy but regular rate and rhythm,  no murmurs, rubs or gallops GI- soft, non-tender, non-distended, bowel sounds present Extremities- no clubbing, cyanosis, or edema; DP/PT/radial pulses 2+ bilaterally MS- no significant deformity or atrophy Skin- warm and dry, no rash or lesion Psych- euthymic mood, full affect Neuro- strength and sensation are intact  Labs:   Lab Results  Component Value Date   WBC 9.3 02/12/2020   HGB 12.8 (L) 02/12/2020   HCT 37.9 (L) 02/12/2020   MCV 91.8 02/12/2020   PLT 143 (L) 02/12/2020    Recent Labs  Lab 02/14/20 0207  NA 141  K 3.7  CL 105  CO2 24  BUN 5*  CREATININE 0.89  CALCIUM 8.1*  GLUCOSE 96      Radiology/Studies: DG CHEST PORT  1 VIEW  Result Date: 02/10/2020 CLINICAL DATA:  Aspiration into airway. EXAM: PORTABLE CHEST 1 VIEW COMPARISON:  January 11, 2020 FINDINGS: Decreased lung volumes are seen, likely secondary to the degree of patient inspiration. Moderate severity diffuse chronic appearing increased interstitial lung markings are seen. Mild atelectasis and/or infiltrate is noted within the left lung base. There is no evidence of a pleural effusion or pneumothorax. The heart size and mediastinal contours are within normal limits. Marked severity calcification of the aortic arch is seen. There is moderate severity dextroscoliosis of the thoracic spine with evidence of prior vertebroplasty seen within the mid and lower thoracic spine. IMPRESSION: Chronic appearing increased interstitial lung markings with mild left basilar atelectasis and/or infiltrate. Electronically Signed   By: Virgina Norfolk M.D.   On: 02/10/2020 21:23    EKG: EKG 12/24 shows SVT at 136 bpm (personally reviewed) EKG 12/10/19 shows baseline conduction of NSR at 74 bpm, PR interval 142 ms, QRS 74 ms  Monitor 08/2018 Baseline rhythm: Sinus Minimum heart rate: 40 BPM.  Average heart rate: 72 BPM.  Maximal heart rate 104 BPM. Atrial arrhythmia: Patient had multiple narrow complex tachycardias the longest of which lasted 3 hours approximately Ventricular arrhythmia: Multiple episodes of nonsustained ventricular tachycardia the longest lasted 11.9 seconds Conduction abnormality: None significant Symptoms: None   TELEMETRY: shows Frequent SVT in 120-130s with occasional post termination pauses and brief periods of rates in 60s. Difficult to appreciate P waves during these  (personally reviewed)   Assessment/Plan: 1.  SVT with occasional 2-3 second post conversion pause Chris Braun review EKGs with Dr. Curt Bears to confirm not flutter. P waves also occasional difficult to appreciate in "sinus" Echo 12/10/2019 LVEF 50-55% Of note, baseline EKG without  conduction disease.  Not candidate for flecainide with CAD In the event that it is required, I did discuss risks, benefits, and reasons for consideration of PPM placement. Pt verbalizes understanding, but would prefer to avoid if possible.   2. CAD Denies s/s ischemia  3. NSVT He had VT ablation in [place] 2008 None noted here, though BB currently washing out.   4. Food impaction -> ? Aspiration PNA Ordered for Unasyn Would prefer to defer PPM placement until further out from this.   Pauses noted are asymptomatic post conversion pauses. May be reasonable to trial increase of BB as previously mentioned in order to encourage sinus rhythm. If patient can hold sinus, Chris Braun be less likely to have post conversion pauses. Chris Braun discuss with Dr. Curt Bears if reasonable to attempt any AAD.   For questions or updates, please contact [place] Please consult [email or web address] for contact info under Cardiology/STEMI.  Signed, Chris Friar, PA-C  02/14/2020 7:41 AM  I have seen and examined this patient with Chris Braun  Braun.  Agree with above, note added to reflect my findings.  On exam, tachycardic, no murmurs.  Patient admitted to the hospital post endoscopy for food impaction after he had an aspiration event.  He developed a narrow complex tachycardia with postconversion pauses of up to 3 seconds.  His narrow complex tachycardia appears to be an atrial tachycardia.  He is asymptomatic from this as well as from his pauses.  His rate control was stopped.  At this point, we Filippo Puls restart his metoprolol at 25 mg twice daily.  As he continues to go in and out of atrial tachycardia, we Mayte Diers start him on Multaq as well.  He does have coronary artery disease and thus flecainide would be a poor option. At this point, as he is asymptomatic, would be okay with discharge from the hospital.  Would not have a problem with discharge with his tachycardia.  He can be discharged on 400 mg twice daily of Multaq with 25 mg  twice daily of metoprolol.  We Omayra Tulloch arrange for follow-up in EP clinic.  Cadance Raus M. Chanie Soucek MD 02/14/2020 10:43 AM

## 2020-02-14 NOTE — Progress Notes (Addendum)
PROGRESS NOTE    Chris Braun  [id number] DOB: 21-Nov-1935 DOA: 02/10/2020 PCP: Penelope Coop, FNP   Brief Narrative:  Chris Braun is a 84 y.o. male with history of CAD status post stenting, status post ablation for ventricular arrhythmia, history of atrial tachycardia, COPD, memory problems was brought to the ER at [facility] from [facility] as a direct transfer after patient had gone to [facility] ER on February 09, 2019 1 in the evening after patient had sudden food impaction after eating.  Since there was no gastroenterologist over there patient was transferred to [facility].  This afternoon patient was electively intubated for EGD and had EGD done by Dr. Havery Moros following which the food was disimpacted but per Dr. Havery Moros patient had a difficult procedure.  Following which patient was extubated.  Patient also briefly was an atrial tachycardia requiring metoprolol 5 mg IV following which heart rate improved to the 80s sinus rhythm.  After the procedure patient has been having bouts of productive cough for which chest x-ray was done showing possible infiltrates.  Patient was afebrile and not hypoxic.  Gastroenterology Dr. Havery Moros likes to patient be observed overnight given the brief episodes of atrial tachycardia not sure patient can still be completely able to swallow. Labs noted largely unremarkable.  EKG shows atrial tachycardia Covid test was negative.  ED Course: Labs done in the ER was showing WBC count of 11.6 otherwise unremarkable.  Covid test was negative.  EKG shows atrial tachycardia.  Atrial tachycardia improved with 1 dose of metoprolol 5 mg IV.  See history of present illness for further details.  Patient at the time of my exam is post extubation is able to talk well denies any chest pain.  Has been having productive cough no active wheezing.  Not hypoxic.  Assessment & Plan:   Food impaction: -status post EGD-4 has been disimpacted on  12/24 by GI. -EGD findings noted that he has significant edema at the site of food impaction-unable to appreciate stenosis, tortuous esophagus with very large hiatal hernia -Continue PPI twice daily and Carafate 10 cc p.o. every 6 hours as needed -Continue with clear liquid diet today, soft diet tomorrow as tolerated -Appreciate GI assistance-GI signed off.  Atrial tachycardia: -Patient has history of NSVT/VT status post ablation in 2008 -TSH: WNL, lactic acid: WNL. -10/26: Discussed with on-call cardiology Dr. Gwenlyn Found who recommended to change Toprol-XL 25 mg daily to metoprolol tartrate 25 mg twice daily. -Due to persistence tachycardia cardiology consulted-patient had sinus pauses after tachycardia therefore metoprolol was discontinued and EP was consulted who recommended to resume metoprolol twice daily and start patient on Multaq -He does have history of coronary artery disease therefore flecainide would be a poor option.  He is asymptomatic and okay to discharge home from EP standpoint on Multaq 400 mg twice daily and metoprolol 25 mg twice daily and recommend outpatient follow-up in EP clinic.  Acute hypoxic Respiratory failure 2/2 Possible aspiration pneumonia: -Reviewed chest x-ray.  Patient is requiring 2 L of oxygen via nasal cannula.  He is afebrile with leukocytosis of 12.6 -Patient started on Unasyn-continue same for now. -His leukocytosis has resolved.  He is maintaining oxygen saturation on room air. -He is evaluated by speech therapist and recommended dysphagia 1 diet.  Coronary artery disease status post stent placement in 2003: Patient denies ACS symptoms -Continue aspirin, Plavix, statin and beta-blocker  Restless leg syndrome: Continue Requip  Dementia: Continue Aricept  BPH: Continue Flomax  History of COPD/asthma: No new wheezing noted on exam.  Continue home inhalers  Hyperlipidemia: Continue statin  Thrombocytopenia: Platelet 143.  No signs of active bleeding.    Hypomagnesemia: Replenished.  Repeat magnesium level tomorrow AM.  Debility: PT/OT consulted recommended SNF placement-await bed availability.  Goals of care: Discussed with patient's wife on 12/26 regarding CODE STATUS and she wants to keep patient full code for now.    DVT prophylaxis: SCD Code Status: Full code Family Communication:  None present at bedside.  Plan of care discussed with patient in length and he verbalized understanding and agreed with it.  Disposition Plan: SNF as soon as bed available  Consultants:   GI  Cardiology  Procedures:   EGD  Antimicrobials:   Unasyn  Dispo: The patient is from: Home              Anticipated d/c is to: SNF              Anticipated d/c date is: 1 day              Patient currently is medically stable for the discharge.   Subjective: Patient seen and examined.  Resting comfortably on the bed.  On room air.  Denies any complaints including chest pain, shortness of breath, palpitation, leg swelling, orthopnea, PND, fever, chills, lightheadedness or dizziness.  Objective: Vitals:   02/14/20 0400 02/14/20 0757 02/14/20 1100 02/14/20 1230  BP: 109/78 126/85  (!) 120/93  Pulse:  100  (!) 150  Resp: 18 18  20   Temp: 97.7 F (36.5 C) 98 F (36.7 C) 98.1 F (36.7 C) 97.7 F (36.5 C)  TempSrc: Oral Oral Oral Oral  SpO2: 94% 94%  95%  Weight:      Height:        Intake/Output Summary (Last 24 hours) at 02/14/2020 1446 Last data filed at 02/14/2020 1236 Gross per 24 hour  Intake 540 ml  Output 1102 ml  Net -562 ml   Filed Weights   02/10/20 1249 02/10/20 2221  Weight: 70.9 kg 68.8 kg    Examination:  General exam: Appears calm and comfortable, elderly, on room air  respiratory system: Clear to auscultation. Respiratory effort normal. Cardiovascular system: Tachycardic, no JVD, murmurs, rubs, gallops or clicks. No pedal edema. Gastrointestinal system: Abdomen is nondistended, soft and nontender. No organomegaly  or masses felt. Normal bowel sounds heard. Central nervous system: Alert and oriented. No focal neurological deficits. Extremities: Symmetric 5 x 5 power. Skin: No rashes, lesions or ulcers  Data Reviewed: I have personally reviewed following labs and imaging studies  CBC: Recent Labs  Lab 02/10/20 1345 02/11/20 0115 02/12/20 0251  WBC 11.6* 12.6* 9.3  NEUTROABS 9.1*  --   --   HGB 15.2 13.4 12.8*  HCT 44.0 38.0* 37.9*  MCV 92.1 90.0 91.8  PLT 164 143* 143*   Basic Metabolic Panel: Recent Labs  Lab 02/10/20 1345 02/11/20 0115 02/12/20 0251 02/13/20 0135 02/14/20 0207  NA 142 141 138  --  141  K 3.7 4.1 3.7  --  3.7  CL 102 103 104  --  105  CO2 27 25 23   --  24  GLUCOSE 95 92 75  --  96  BUN 14 13 13   --  5*  CREATININE 0.99 0.97 0.92  --  0.89  CALCIUM 9.2 8.3* 7.8*  --  8.1*  MG  --   --  1.5* 1.8 1.7   GFR: Estimated Creatinine Clearance: 55.7  mL/min (by C-G formula based on SCr of 0.89 mg/dL). Liver Function Tests: No results for input(s): AST, ALT, ALKPHOS, BILITOT, PROT, ALBUMIN in the last 168 hours. No results for input(s): LIPASE, AMYLASE in the last 168 hours. No results for input(s): AMMONIA in the last 168 hours. Coagulation Profile: No results for input(s): INR, PROTIME in the last 168 hours. Cardiac Enzymes: No results for input(s): CKTOTAL, CKMB, CKMBINDEX, TROPONINI in the last 168 hours. BNP (last 3 results) No results for input(s): PROBNP in the last 8760 hours. HbA1C: No results for input(s): HGBA1C in the last 72 hours. CBG: No results for input(s): GLUCAP in the last 168 hours. Lipid Profile: No results for input(s): CHOL, HDL, LDLCALC, TRIG, CHOLHDL, LDLDIRECT in the last 72 hours. Thyroid Function Tests: No results for input(s): TSH, T4TOTAL, FREET4, T3FREE, THYROIDAB in the last 72 hours. Anemia Panel: No results for input(s): VITAMINB12, FOLATE, FERRITIN, TIBC, IRON, RETICCTPCT in the last 72 hours. Sepsis Labs: Recent Labs  Lab  02/11/20 0538 02/11/20 0709  LATICACIDVEN 0.7 0.7    Recent Results (from the past 240 hour(s))  Resp Panel by RT-PCR (Flu A&B, Covid) Nasopharyngeal Swab     Status: None   Collection Time: 02/10/20  1:45 PM   Specimen: Nasopharyngeal Swab; Nasopharyngeal(NP) swabs in vial transport medium  Result Value Ref Range Status   SARS Coronavirus 2 by RT PCR NEGATIVE NEGATIVE Final    Comment: (NOTE) SARS-CoV-2 target nucleic acids are NOT DETECTED.  The SARS-CoV-2 RNA is generally detectable in upper respiratory specimens during the acute phase of infection. The lowest concentration of SARS-CoV-2 viral copies this assay can detect is 138 copies/mL. A negative result does not preclude SARS-Cov-2 infection and should not be used as the sole basis for treatment or other patient management decisions. A negative result may occur with  improper specimen collection/handling, submission of specimen other than nasopharyngeal swab, presence of viral mutation(s) within the areas targeted by this assay, and inadequate number of viral copies(<138 copies/mL). A negative result must be combined with clinical observations, patient history, and epidemiological information. The expected result is Negative.  Fact Sheet for Patients:  EntrepreneurPulse.com.au  Fact Sheet for Healthcare Providers:  IncredibleEmployment.be  This test is no t yet approved or cleared by the [place] FDA and  has been authorized for detection and/or diagnosis of SARS-CoV-2 by FDA under an Emergency Use Authorization (EUA). This EUA will remain  in effect (meaning this test can be used) for the duration of the COVID-19 declaration under Section 564(b)(1) of the Act, 21 U.S.C.section 360bbb-3(b)(1), unless the authorization is terminated  or revoked sooner.       Influenza A by PCR NEGATIVE NEGATIVE Final   Influenza B by PCR NEGATIVE NEGATIVE Final    Comment: (NOTE) The Xpert  Xpress SARS-CoV-2/FLU/RSV plus assay is intended as an aid in the diagnosis of influenza from Nasopharyngeal swab specimens and should not be used as a sole basis for treatment. Nasal washings and aspirates are unacceptable for Xpert Xpress SARS-CoV-2/FLU/RSV testing.  Fact Sheet for Patients: EntrepreneurPulse.com.au  Fact Sheet for Healthcare Providers: IncredibleEmployment.be  This test is not yet approved or cleared by the [place] FDA and has been authorized for detection and/or diagnosis of SARS-CoV-2 by FDA under an Emergency Use Authorization (EUA). This EUA will remain in effect (meaning this test can be used) for the duration of the COVID-19 declaration under Section 564(b)(1) of the Act, 21 U.S.C. section 360bbb-3(b)(1), unless the authorization is terminated or revoked.  Performed at [facility], [place] [address] [place], [place] [address]       Radiology Studies: No results found.  Scheduled Meds: . atorvastatin  80 mg Oral QHS  . calcitonin (salmon)  1 spray Alternating Nares QHS  . clopidogrel  75 mg Oral Daily  . digoxin  0.125 mg Oral QODAY  . donepezil  5 mg Oral QHS  . dronedarone  400 mg Oral BID WC  . metoprolol tartrate  25 mg Oral BID  . mometasone-formoterol  2 puff Inhalation BID  . pantoprazole (PROTONIX) IV  40 mg Intravenous Q12H  . rOPINIRole  0.5 mg Oral TID  . sucralfate  1 g Oral TID WC & HS  . tamsulosin  0.4 mg Oral QPC supper  . vitamin B-12  2,000 mcg Oral Daily   Continuous Infusions: . ampicillin-sulbactam (UNASYN) IV 3 g (02/14/20 1230)     LOS: 3 days   Time spent: 30 minutes   Odile Veloso Loann Quill, MD [facility]  If 7PM-7AM, please contact night-coverage [email or web address] 02/14/2020, 2:46 PM

## 2020-02-14 NOTE — TOC Benefit Eligibility Note (Signed)
Transition of Care [facility]) Benefit Eligibility Note    Patient Details  Name: Chris Braun MRN: [id number] Date of Birth: Jan 04, 1936   Medication/Dose: Multaq 400mg . bid 30 day supply  Covered?: Yes  Tier: 3 Drug  Prescription Coverage Preferred Pharmacy: Any Pharmacy  Spoke with Person/Company/Phone Number:: Naydu M. W[facility] [phone]  Co-Pay: $93.49  Prior Approval: No  Deductible:  (no deductible)       [phone] Phone Number: 02/14/2020, 1:13 PM

## 2020-02-14 NOTE — TOC Initial Note (Signed)
Transition of Care [facility]) - Initial/Assessment Note    Patient Details  Name: Chris Braun MRN: [id number] Date of Birth: 11/09/1935  Transition of Care [facility]) CM/SW Contact:    Carmina Miller, LCSWA Phone Number: 02/14/2020, 5:06 PM  Clinical Narrative:                 CSW tried to reach out to pt's spouse, had to leave a vm, also called pt's daughter, the number is disconnected. SNF workup is complete, auth NOT started. Choice needs to be made.         Patient Goals and CMS Choice        Expected Discharge Plan and Services                                                Prior Living Arrangements/Services                       Activities of Daily Living      Permission Sought/Granted                  Emotional Assessment              Admission diagnosis:  Atrial tachycardia (HCC) [I47.1] Food bolus obstruction of intestine (HCC) [K56.699] Tachycardia [R00.0] Food impaction of esophagus [T18.128A] Patient Active Problem List   Diagnosis Date Noted  . Aspiration into airway   . Food impaction of esophagus   . SVT (supraventricular tachycardia) (HCC) 12/09/2019  . Cataracts, bilateral   . Depression   . Kidney stones   . Lumbar vertebral fracture, pathologic 10/27/2019  . Compression fracture of L2 lumbar vertebra, closed, initial encounter (HCC) 10/27/2019  . Hypoxia   . Atrial tachycardia (HCC) 07/26/2018  . Stroke (HCC)   . Osteoporosis   . Macular degeneration   . Idiopathic progressive polyneuropathy   . Hypertension   . High cholesterol   . Hiatal hernia   . GERD (gastroesophageal reflux disease)   . Asthma   . COPD (chronic obstructive pulmonary disease) (HCC)   . Diverticulosis   . B12 deficiency   . Acute hypoxemic respiratory failure (HCC) 05/27/2018  . AKI (acute kidney injury) (HCC) 05/27/2018  . Right kidney stone 05/27/2018  . Right upper lobe pneumonia 05/27/2018  . Status post ablation of  ventricular arrhythmia 04/27/2018  . Memory disorder 01/19/2018  . Gait abnormality 01/19/2018  . 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 Falls frequently 06/03/2015  . Weakness of distal arms and legs 06/03/2015  . Risk for falls 05/28/2015  . CAD (coronary artery disease) 08/24/2014  . Carotid stenosis, bilateral 08/24/2014  . Dyslipidemia 08/24/2014  . 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 Myocardial infarction [facility]) 2002   PCP:  Gordy Councilman, FNP Pharmacy:   [facility] - [address] [place] - [address] [place] [address] Phone: [phone] Fax: [phone]     Social Determinants of Health (SDOH) Interventions    Readmission Risk Interventions No flowsheet data found.

## 2020-02-14 NOTE — NC FL2 (Signed)
[place] MEDICAID FL2 LEVEL OF CARE SCREENING TOOL     IDENTIFICATION  Patient Name: Chris Braun Birthdate: 16-May-1935 Sex: male Admission Date (Current Location): 02/10/2020  [facility] and [place] Number:  Producer, television/film/video and Address:  [facility]. [facility], [address], [place], [place] [address]      Provider Number: [id number]  Attending Physician Name and Address:  Ollen Bowl, MD  Relative Name and Phone Number:       Current Level of Care: SNF Recommended Level of Care: Skilled Nursing Facility Prior Approval Number:    Date Approved/Denied:   PASRR Number: [id number]  Discharge Plan: SNF    Current Diagnoses: Patient Active Problem List   Diagnosis Date Noted  . Aspiration into airway   . Food impaction of esophagus   . SVT (supraventricular tachycardia) (HCC) 12/09/2019  . Cataracts, bilateral   . Depression   . Kidney stones   . Lumbar vertebral fracture, pathologic 10/27/2019  . Compression fracture of L2 lumbar vertebra, closed, initial encounter (HCC) 10/27/2019  . Hypoxia   . Atrial tachycardia (HCC) 07/26/2018  . Stroke (HCC)   . Osteoporosis   . Macular degeneration   . Idiopathic progressive polyneuropathy   . Hypertension   . High cholesterol   . Hiatal hernia   . GERD (gastroesophageal reflux disease)   . Asthma   . COPD (chronic obstructive pulmonary disease) (HCC)   . Diverticulosis   . B12 deficiency   . Acute hypoxemic respiratory failure (HCC) 05/27/2018  . AKI (acute kidney injury) (HCC) 05/27/2018  . Right kidney stone 05/27/2018  . Right upper lobe pneumonia 05/27/2018  . Status post ablation of ventricular arrhythmia 04/27/2018  . Memory disorder 01/19/2018  . Gait abnormality 01/19/2018  . 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 Falls frequently 06/03/2015  . Weakness of distal arms and legs 06/03/2015  . Risk for falls 05/28/2015  . CAD (coronary artery disease) 08/24/2014  .  Carotid stenosis, bilateral 08/24/2014  . Dyslipidemia 08/24/2014  . 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 Myocardial infarction [facility]) 2002    Orientation RESPIRATION BLADDER Height & Weight     Self  Normal Incontinent,External catheter Weight: 151 lb 10.8 oz (68.8 kg) Height:  5\' 5"  (165.1 cm)  BEHAVIORAL SYMPTOMS/MOOD NEUROLOGICAL BOWEL NUTRITION STATUS      Incontinent Diet (see dc summary)  AMBULATORY STATUS COMMUNICATION OF NEEDS Skin   Limited Assist Verbally Normal                       Personal Care Assistance Level of Assistance  Bathing,Feeding,Dressing Bathing Assistance: Limited assistance Feeding assistance: Independent Dressing Assistance: Limited assistance     Functional Limitations Info  Sight,Hearing,Speech Sight Info: Adequate Hearing Info: Impaired Speech Info: Adequate    SPECIAL CARE FACTORS FREQUENCY  PT (By licensed PT),OT (By licensed OT)     PT Frequency: 5x week OT Frequency: 5x week            Contractures Contractures Info: Not present    Additional Factors Info  Code Status,Allergies Code Status Info: FULL Allergies Info: Pneumovax (Pneumococcal Polysaccharide Vaccine)   Ibuprofen   Norco (Hydrocodone-acetaminophen)   Oxycodone           Current Medications (02/14/2020):  This is the current hospital active medication list Current Facility-Administered Medications  Medication Dose Route Frequency Provider Last Rate Last Admin  . Ampicillin-Sulbactam (UNASYN) 3 g in sodium chloride 0.9 %  100 mL IVPB  3 g Intravenous Q6H Franky Macho, RPH 200 mL/hr at 02/14/20 1700 3 g at 02/14/20 1700  . atorvastatin (LIPITOR) tablet 80 mg  80 mg Oral QHS Rise Patience, MD   80 mg at 02/13/20 2028  . calcitonin (salmon) (MIACALCIN/FORTICAL) nasal spray 1 spray  1 spray Alternating Nares QHS Rise Patience, MD   1 spray at 02/13/20 2027  . clopidogrel (PLAVIX) tablet 75 mg  75 mg Oral Daily Rise Patience, MD   75 mg at  02/14/20 1230  . digoxin (LANOXIN) tablet 0.125 mg  0.125 mg Oral Joellyn Quails, MD   0.125 mg at 02/13/20 [id number]  . donepezil (ARICEPT) tablet 5 mg  5 mg Oral QHS Rise Patience, MD   5 mg at 02/13/20 2028  . dronedarone (MULTAQ) tablet 400 mg  400 mg Oral BID WC Shirley Friar, PA-C   400 mg at 02/14/20 1624  . metoprolol tartrate (LOPRESSOR) tablet 25 mg  25 mg Oral BID Shirley Friar, PA-C   25 mg at 02/14/20 1230  . mometasone-formoterol (DULERA) 200-5 MCG/ACT inhaler 2 puff  2 puff Inhalation BID Rise Patience, MD   2 puff at 02/14/20 0806  . nitroGLYCERIN (NITROSTAT) SL tablet 0.4 mg  0.4 mg Sublingual Q5 Min x 3 PRN Rise Patience, MD      . pantoprazole (PROTONIX) injection 40 mg  40 mg Intravenous Q12H Rise Patience, MD   40 mg at 02/14/20 0800  . rOPINIRole (REQUIP) tablet 0.5 mg  0.5 mg Oral TID Rise Patience, MD   0.5 mg at 02/14/20 1624  . sucralfate (CARAFATE) 1 GM/10ML suspension 1 g  1 g Oral TID WC & HS Rise Patience, MD   1 g at 02/14/20 1624  . tamsulosin (FLOMAX) capsule 0.4 mg  0.4 mg Oral QPC supper Rise Patience, MD   0.4 mg at 02/14/20 1624  . vitamin B-12 (CYANOCOBALAMIN) tablet 2,000 mcg  2,000 mcg Oral Daily Rise Patience, MD   2,000 mcg at 02/14/20 1230     Discharge Medications: Please see discharge summary for a list of discharge medications.  Relevant Imaging Results:  Relevant Lab Results:   Additional Information    Ronnel Zuercher B Amena Dockham, LCSWA

## 2020-02-15 DIAGNOSIS — K56699 Other intestinal obstruction unspecified as to partial versus complete obstruction: Secondary | ICD-10-CM

## 2020-02-15 LAB — CBC
HCT: 38.1 % — ABNORMAL LOW (ref 39.0–52.0)
Hemoglobin: 13.2 g/dL (ref 13.0–17.0)
MCH: 31.1 pg (ref 26.0–34.0)
MCHC: 34.6 g/dL (ref 30.0–36.0)
MCV: 89.6 fL (ref 80.0–100.0)
Platelets: 183 10*3/uL (ref 150–400)
RBC: 4.25 MIL/uL (ref 4.22–5.81)
RDW: 13.4 % (ref 11.5–15.5)
WBC: 9.2 10*3/uL (ref 4.0–10.5)
nRBC: 0 % (ref 0.0–0.2)

## 2020-02-15 LAB — MAGNESIUM: Magnesium: 1.8 mg/dL (ref 1.7–2.4)

## 2020-02-15 NOTE — Progress Notes (Signed)
   Tele reviewed.   Overall baseline has flipped and pt now having more sinus/sinus brady than atrial tach, but continues to have frequent paroxysms.  Continue multaq and lopressor.    Outpatient EP follow up has been arranged.  EP to see as needed while here. He is awaiting SNF bed.   Casimiro Needle [address]" [place], PA-C  02/15/2020 6:45 AM

## 2020-02-15 NOTE — TOC Initial Note (Addendum)
Transition of Care [facility]) - Initial/Assessment Note    Patient Details  Name: MCCARTHY GOLDWASSER MRN: [id number] Date of Birth: 04/18/1935  Transition of Care [facility]) CM/SW Contact:    Vinie Sill, [place] Phone Number: 02/15/2020, 3:07 PM  Clinical Narrative:                  CSW called and spoke with patient's wife,Jo Ann. CSW introduced self and explained role. CSW discussed PT recommendation of short term rehab at [facility]. She states only she and the patient is in the home. 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  She is agreeable to SNF placement. Her preferred SNF choice is [facility]. CSW explained referral was sent to [facility] but he was declined. She states she really needs a SNF in [place] because she can not drive to [place] to visit it was too far. CSW provided her with the names of other facilities in [place]. She selected [facility]-patient has received covid vaccine and booster shot.  CSW sent referral to [facility] [facility]- waiting on response. CSW contacted [employer] to start insurance authorization- SNF choice pending  CSW will continue to follow and assist with discharge planning.  Thurmond Butts, MSW, LCSW Clinical Social Worker     Expected Discharge Plan: Skilled Nursing Facility Barriers to Discharge: Insurance Authorization,Continued Medical Work up   Patient Goals and CMS Choice Patient states their goals for this hospitalization and ongoing recovery are:: wife wants patient to get stong enough to return home      Expected Discharge Plan and Services Expected Discharge Plan: [place] In-house Referral: Clinical Social Work                                            Prior Living Arrangements/Services   Lives with:: Self,Spouse Patient language and need for interpreter reviewed:: No        Need for Family Participation  in Patient Care: Yes (Comment) Care giver support system in place?: Yes (comment)      Activities of Daily Living      Permission Sought/Granted Permission sought to share information with : Family Supports    Share Information with NAME: Rc Schiraldi  Permission granted to share info w AGENCY: SNFs  Permission granted to share info w Relationship: spouse  Permission granted to share info w Contact Information: [phone]  Emotional Assessment       Orientation: : Oriented to Self Alcohol / Substance Use: Not Applicable Psych Involvement: No (comment)  Admission diagnosis:  Atrial tachycardia (HCC) [I47.1] Food bolus obstruction of intestine ([place]) [K56.699] Tachycardia [R00.0] Food impaction of esophagus [id number] Patient Active Problem List   Diagnosis Date Noted  . Aspiration into airway   . Food impaction of esophagus   . SVT (supraventricular tachycardia) ([place]) 12/09/2019  . Cataracts, bilateral   . Depression   . Kidney stones   . Lumbar vertebral fracture, pathologic 10/27/2019  . Compression fracture of L2 lumbar vertebra, closed, initial encounter ([place]) 10/27/2019  . Hypoxia   . Atrial tachycardia ([place]) 07/26/2018  . Stroke ([place])   . Osteoporosis   . Macular degeneration   . Idiopathic progressive polyneuropathy   . Hypertension   . High cholesterol   . Hiatal hernia   .  GERD (gastroesophageal reflux disease)   . Asthma   . COPD (chronic obstructive pulmonary disease) (HCC)   . Diverticulosis   . B12 deficiency   . Acute hypoxemic respiratory failure (HCC) 05/27/2018  . AKI (acute kidney injury) (HCC) 05/27/2018  . Right kidney stone 05/27/2018  . Right upper lobe pneumonia 05/27/2018  . Status post ablation of ventricular arrhythmia 04/27/2018  . Memory disorder 01/19/2018  . Gait abnormality 01/19/2018  . 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 Falls frequently 06/03/2015  . Weakness of distal arms and legs 06/03/2015  . Risk for  falls 05/28/2015  . CAD (coronary artery disease) 08/24/2014  . Carotid stenosis, bilateral 08/24/2014  . Dyslipidemia 08/24/2014  . 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 Myocardial infarction [facility]) 2002   PCP:  Gordy Councilman, FNP Pharmacy:   [facility] - [address], [place] - [address] [place] [address] Phone: [phone] Fax: [phone]     Social Determinants of Health (SDOH) Interventions    Readmission Risk Interventions No flowsheet data found.

## 2020-02-15 NOTE — Progress Notes (Addendum)
PROGRESS NOTE  PANFILO SIBAJA [id number] DOB: 01/16/1936 DOA: 02/10/2020 PCP: Penelope Coop, FNP  Brief History   ERDMAN ROHER a 84 y.o.malewithhistory of CAD status post stenting, status post ablation for ventricular arrhythmia, history of atrial tachycardia, COPD, memory problems was brought to the ER at [facility] from [facility] as a direct transfer after patient had gone to [place] ER on February 09, 2019 1 in the evening after patient had sudden food impaction after eating. Since there was no gastroenterologist over there patient was transferred to [facility]. This afternoon patient was electively intubated for EGD and had EGD done by Dr. Havery Moros following which the food was disimpacted but per Dr. Havery Moros patient had a difficult procedure. Following which patient was extubated. Patient also briefly was an atrial tachycardia requiring metoprolol 5 mg IV following which heart rate improved to the 80s sinus rhythm. After the procedure patient has been having bouts of productive cough for which chest x-ray was done showing possible infiltrates. Patient was afebrile and not hypoxic. Gastroenterology Dr. Havery Moros likes to patient be observed overnight given the brief episodes of atrial tachycardia not sure patient can still be completely able to swallow. Labs noted largely unremarkable. EKG shows atrial tachycardia Covid test was negative.  ED Course:Labs done in the ER was showing WBC count of 11.6 otherwise unremarkable. Covid test was negative. EKG shows atrial tachycardia. Atrial tachycardia improved with 1 dose of metoprolol 5 mg IV. See history of present illness for further details. Patient at the time of my exam is post extubation is able to talk well denies any chest pain. Has been having productive cough no active wheezing. Not hypoxic.  The patient has a history of NSVT/VT since ablation in 2008. Due to persistent tachycardia on  02/12/2020 the patient was discussed with Dr. Gwenlyn Found who recommended to change Toprol-XL to metoprolol tartrate 25 mg twice daily. Also his digoxin level was noted to be low on admission.  Discussed with pharmacy will give digoxin 0.125 mg p.o. once along with his usual dose. Sinus tachycardia continued on 02/13/2020 and formal consult from cardiology was requested. The patient was seen by Almyra Deforest, [place]. He found the patient to be having both tachycardia and prolonged pauses. He left the patient on every other day digoxin, but he stopped metoprolol. EP Cardiology service was consulted. The patient was seen by Dr. Curt Bears on 12.29.2021. He found the pateint to be going in and out of atrial tachycardia from which the patient was asymptomatic. He restarted the patient's lopressor 25 mg bid and placed the patient on Multaq1. Over the past 24 hours the patient has become more sinus/sinus brady. Outpatient EP follow-up has been arranged.  The patient is awaiting SNF placement.  Consultants  Gastroenterology Cardiology EP Cardiology  Procedures  . None  Antibiotics   Anti-infectives (From admission, onward)   Start     Dose/Rate Route Frequency Ordered Stop   02/11/20 0600  Ampicillin-Sulbactam (UNASYN) 3 g in sodium chloride 0.9 % 100 mL IVPB        3 g 200 mL/hr over 30 Minutes Intravenous Every 6 hours 02/11/20 0518      .   Subjective  The patient is resting quietly. No new complaints.  Objective   Vitals:  Vitals:   02/15/20 1112 02/15/20 1545  BP: (!) 130/95 (!) 123/94  Pulse: (!) 107 (!) 108  Resp: 19 18  Temp: 97.9 F (36.6 C) (!) 97.5 F (36.4 C)  SpO2: 94% 96%    Exam:  Constitutional:  . The patient is awake, alert, and oriented x 3. No acute distress. Respiratory:  . No increased work of breathing. . No wheezes, rales, or rhonchi . No tactile fremitus Cardiovascular:  . Regular rate and rhythm . No murmurs, ectopy, or gallups. . No lateral PMI. No  thrills. Abdomen:  . Abdomen is soft, non-tender, non-distended . No hernias, masses, or organomegaly . Normoactive bowel sounds.  Musculoskeletal:  . No cyanosis, clubbing, or edema Skin:  . No rashes, lesions, ulcers . palpation of skin: no induration or nodules Neurologic:  . CN 2-12 intact . Sensation all 4 extremities intact Psychiatric:  . Mental status o Mood, affect appropriate o Orientation to person, place, time  . judgment and insight appear intact   I have personally reviewed the following:   Today's Data  . Vitals, CBC  Imaging  . CXR  Cardiology Data  . EKG  Scheduled Meds: . atorvastatin  80 mg Oral QHS  . calcitonin ([place])  1 spray Alternating Nares QHS  . clopidogrel  75 mg Oral Daily  . digoxin  0.125 mg Oral QODAY  . donepezil  5 mg Oral QHS  . dronedarone  400 mg Oral BID WC  . mouth rinse  15 mL Mouth Rinse BID  . metoprolol tartrate  25 mg Oral BID  . mometasone-formoterol  2 puff Inhalation BID  . pantoprazole (PROTONIX) IV  40 mg Intravenous Q12H  . rOPINIRole  0.5 mg Oral TID  . sucralfate  1 g Oral TID WC & HS  . tamsulosin  0.4 mg Oral QPC supper  . vitamin B-12  2,000 mcg Oral Daily   Continuous Infusions: . ampicillin-sulbactam (UNASYN) IV 3 g (02/15/20 1110)    Principal Problem:   Food impaction of esophagus Active Problems:   Memory disorder   Status post ablation of ventricular arrhythmia   Atrial tachycardia (HCC)   Aspiration into airway   LOS: 4 days   A & P   Acute hypoxic Respiratory failure due to aspiration pneumonia: Reviewed chest x-ray.  Patient is requiring 2 L of oxygen via nasal cannula.  He is afebrile with leukocytosis of 9.2 down from 12.6 upon admission. He is receiving Unasyn. He is saturating 96% on room air. He has been evaluated by speech therapist and recommended dysphagia 1 diet.  Coronary artery disease status post stent placement in 2003: Patient denies ACS symptoms. He has been continued on  aspirin, Plavix, statin, and beta-blocker.  Restless leg syndrome: Continue Requip  Dementia: Continue Aricept  BPH: Continue Flomax  History of COPD/asthma: No new wheezing noted on exam.  Continue home inhalers  Hyperlipidemia: Continue statin  Thrombocytopenia: Resolved. Platelet 183.   Hypomagnesemia: Monitor and supplement as necessary. 1.8 this morning.  Debility: PT/OT consulted recommended SNF placement-await bed availability.  I have seen and examined this patient myself. I have spent 35 minutes in his evaluation and care.  Goals of care: Discussed with patient's wife on 12/26 regarding CODE STATUS and she wants to keep patient full code for now.    DVT prophylaxis: SCD Code Status: Full code Family Communication:  None present at bedside.  Plan of care discussed with patient in length and he verbalized understanding and agreed with it.  Disposition Plan: Status is: Inpatient  Remains inpatient appropriate because:Unsafe d/c plan   Dispo: The patient is from: Home  Anticipated d/c is to: SNF              Anticipated d/c date is: 2 days              Patient currently is medically stable for DC  Cayci Mcnabb, DO [facility] Direct contact: see [email or web address]  7PM-7AM contact night coverage as above 02/15/2020, 4:32 PM  LOS: 4 days

## 2020-02-16 ENCOUNTER — Inpatient Hospital Stay (HOSPITAL_COMMUNITY): Payer: PPO

## 2020-02-16 DIAGNOSIS — R059 Cough, unspecified: Secondary | ICD-10-CM

## 2020-02-16 LAB — BASIC METABOLIC PANEL
Anion gap: 12 (ref 5–15)
BUN: 10 mg/dL (ref 8–23)
CO2: 24 mmol/L (ref 22–32)
Calcium: 8.6 mg/dL — ABNORMAL LOW (ref 8.9–10.3)
Chloride: 104 mmol/L (ref 98–111)
Creatinine, Ser: 1.03 mg/dL (ref 0.61–1.24)
GFR, Estimated: >60 mL/min (ref >60)
Glucose, Bld: 107 mg/dL — ABNORMAL HIGH (ref 70–99)
Potassium: 2.8 mmol/L — ABNORMAL LOW (ref 3.5–5.1)
Sodium: 140 mmol/L (ref 135–145)

## 2020-02-16 LAB — MAGNESIUM: Magnesium: 1.5 mg/dL — ABNORMAL LOW (ref 1.7–2.4)

## 2020-02-16 MED ORDER — APIXABAN 5 MG PO TABS: 5.0000 mg | ORAL_TABLET | Freq: Two times a day (BID) | ORAL | 0 refills | Status: DC

## 2020-02-16 MED ORDER — AMIODARONE HCL 200 MG PO TABS
400.0000 mg | ORAL_TABLET | Freq: Two times a day (BID) | ORAL | Status: DC
  Administered 2020-02-16: 17:00:00 400 mg via ORAL
  Filled 2020-02-16: qty 2

## 2020-02-16 MED ORDER — POTASSIUM CHLORIDE CRYS ER 20 MEQ PO TBCR
40.0000 meq | EXTENDED_RELEASE_TABLET | Freq: Once | ORAL | Status: AC
  Administered 2020-02-16: 16:00:00 40 meq via ORAL
  Filled 2020-02-16: qty 2

## 2020-02-16 MED ORDER — POTASSIUM CHLORIDE CRYS ER 20 MEQ PO TBCR
40.0000 meq | EXTENDED_RELEASE_TABLET | Freq: Once | ORAL | Status: AC
  Administered 2020-02-16: 18:00:00 40 meq via ORAL
  Filled 2020-02-16: qty 2

## 2020-02-16 MED ORDER — AMOXICILLIN-POT CLAVULANATE 875-125 MG PO TABS: 1.0000 | ORAL_TABLET | Freq: Two times a day (BID) | ORAL | 0 refills | Status: AC

## 2020-02-16 MED ORDER — SUCRALFATE 1 GM/10ML PO SUSP: 1.0000 g | Freq: Three times a day (TID) | ORAL | 0 refills | Status: DC

## 2020-02-16 MED ORDER — MAGNESIUM SULFATE 4 GM/100ML IV SOLN
4.0000 g | Freq: Once | INTRAVENOUS | Status: AC
  Administered 2020-02-16: 16:00:00 4 g via INTRAVENOUS
  Filled 2020-02-16: qty 100

## 2020-02-16 MED ORDER — APIXABAN 5 MG PO TABS
5.0000 mg | ORAL_TABLET | Freq: Two times a day (BID) | ORAL | Status: DC
  Administered 2020-02-16 (×2): 5 mg via ORAL
  Filled 2020-02-16 (×2): qty 1

## 2020-02-16 MED ORDER — PANTOPRAZOLE SODIUM 40 MG PO TBEC: 40.0000 mg | DELAYED_RELEASE_TABLET | Freq: Two times a day (BID) | ORAL | 0 refills | Status: AC

## 2020-02-16 MED ORDER — METOPROLOL TARTRATE 25 MG PO TABS: 25.0000 mg | ORAL_TABLET | Freq: Two times a day (BID) | ORAL | 0 refills | Status: DC

## 2020-02-16 MED ORDER — AMIODARONE HCL 400 MG PO TABS: 400.0000 mg | ORAL_TABLET | Freq: Two times a day (BID) | ORAL | 0 refills | Status: DC

## 2020-02-16 NOTE — TOC Progression Note (Signed)
Transition of Care [facility]) - Progression Note    Patient Details  Name: Chris Braun MRN: [id number] Date of Birth: May 06, 1935  Transition of Care [facility]) CM/SW Contact  Eduard Roux, [place] Phone Number: 02/16/2020, 3:56 PM  Clinical Narrative:     CSW was informed by MD, peer to peer appeal was denied. CSW has informed the patient's wife of decision. She expressed concerns about meeting his needs but states she can not afford to LTC or private pay for assistance. CSW encourage the patient to inquire about concierge services with [employer].   CSW advised he was approved for [facility] # [id number]  Antony Blackbird, MSW, LCSW Clinical Social Worker    Expected Discharge Plan: Skilled Nursing Facility Barriers to Discharge: Insurance Authorization,Continued Medical Work up  Expected Discharge Plan and Services Expected Discharge Plan: Skilled Nursing Facility In-house Referral: Clinical Social Work                                             Social Determinants of Health (SDOH) Interventions    Readmission Risk Interventions No flowsheet data found.

## 2020-02-16 NOTE — TOC Progression Note (Signed)
Transition of Care [facility]) - Progression Note    Patient Details  Name: Chris Braun MRN: [id number] Date of Birth: [birth year]  Transition of Care [facility]) CM/SW Contact  Eduard Roux, [place] Phone Number: 02/16/2020, 10:11 AM  Clinical Narrative:     CSW received voice message from [employer]/Stephanie -patient was declined for SNF - sent message to attending to determine if peer to peer will be pursued-waiting on response  Received approval for [facility] # [id number]   Antony Blackbird, MSW, LCSW Clinical Social Worker   Expected Discharge Plan: Skilled Nursing Facility Barriers to Discharge: Insurance Authorization,Continued Medical Work up  Expected Discharge Plan and Services Expected Discharge Plan: Skilled Nursing Facility In-house Referral: Clinical Social Work                                             Social Determinants of Health (SDOH) Interventions    Readmission Risk Interventions No flowsheet data found.

## 2020-02-16 NOTE — Discharge Instructions (Signed)

## 2020-02-16 NOTE — Progress Notes (Signed)
ANTICOAGULATION CONSULT NOTE - Initial Consult  Pharmacy Consult for apixaban  Indication: atrial fibrillation   Patient Measurements: Height: 5\' 5"  (165.1 cm) Weight: 68.8 kg (151 lb 10.8 oz) IBW/kg (Calculated) : 61.5   Vital Signs: Temp: 98 F (36.7 C) (12/30 0827) Temp Source: Oral (12/30 0827) BP: 130/86 (12/30 0827) Pulse Rate: 112 (12/30 0827)  Labs: Recent Labs    02/14/20 0207 02/15/20 0046  HGB  --  13.2  HCT  --  38.1*  PLT  --  183  CREATININE 0.89  --     Estimated Creatinine Clearance: 55.7 mL/min (by C-G formula based on SCr of 0.89 mg/dL).   Medications:  Amiodarone  ASA  Assessment: 59 yom with history of CAD s/p stenting and ablation for ventricular arrhythmia, hx of Atach, COPD, and cognitive problems. Patient continues to have paroxysms of SVT with asymptomatic rates in the 12s-130s. CHA2D2VASC of at least 7 at this time and will start Eliquis per electrophysiology. Most recent Hgb is 13.2 and Scr is 0.89. Monitor weight and renal function for dose reduction.   Goal of Therapy:   Monitor platelets by anticoagulation protocol: Yes   Plan:  Apixaban 5mg  BID  Monitor s/sx of bleed  Monitor renal function and weight for dose adjustment need   97 Erminia Mcnew 02/16/2020,11:06 AM

## 2020-02-16 NOTE — TOC Progression Note (Signed)
Transition of Care [facility]) - Progression Note    Patient Details  Name: Chris Braun MRN: [id number] Date of Birth: Feb 24, 1935  Transition of Care [facility]) CM/SW Contact  Eduard Roux, [place] Phone Number: 02/16/2020, 11:41 AM  Clinical Narrative:     MD has confirmed she will do peer to peer, will make call this afternoon - provided contact number for Dr. Logan Bores [phone].  Antony Blackbird, MSW, LCSW Clinical Social Worker   Expected Discharge Plan: Skilled Nursing Facility Barriers to Discharge: Insurance Authorization,Continued Medical Work up  Expected Discharge Plan and Services Expected Discharge Plan: Skilled Nursing Facility In-house Referral: Clinical Social Work                                             Social Determinants of Health (SDOH) Interventions    Readmission Risk Interventions No flowsheet data found.

## 2020-02-16 NOTE — TOC Transition Note (Signed)
Transition of Care [facility]) - CM/SW Discharge Note Donn Pierini RN, BSN Transitions of Care Unit 4E- RN Case Manager See Treatment Team for direct phone #    Patient Details  Name: SKYLEN SPIERING MRN: [id number] Date of Birth: 09/09/1935  Transition of Care [facility]) CM/SW Contact:  Darrold Span, RN Phone Number: 02/16/2020, 4:32 PM   Clinical Narrative:    Insurance denied STSNF- P2P completed per MD- insurance has still denied STSNF stay. CSW- Erie Noe and this Clinical research associate spoke with wife at the bedside- wife does not want LTC and states she can not afford to pay out of pocket for a STSNF stay. Plan is for pt to return home with [facility] services- wife states she wants to continue with [facility] which pt was active with prior to admit. Discussed DME needs- per wife pt has needed DME- only request from wife was a new 3n1 for home. Order has been placed and 3n1 delivered to the room - paperwork faxed to [facility] with [employer].  Per CSW insurance has approved pt to return home by non emergent EMS- auth # [id number]. Wife agreeable to EMS transport.   Call made to [facility]- spoke with Enrique Sack- they are agreeable to accept pt. Back into service- they were seeing pt for RN/PT- confirmed they can also provide the added services ordered- RN/PT/OT/aide/SW. New orders faxed via epic to [phone].   Pt will transport via [facility].  1700- [facility] called for transport- paperwork placed on shadow chart,    Final next level of care: Home w Home Health Services Barriers to Discharge: Barriers Resolved   Patient Goals and CMS Choice Patient states their goals for this hospitalization and ongoing recovery are:: returning home- insurance denied STSNF stay CMS Medicare.gov Compare Post Acute Care list provided to:: Patient Choice offered to / list presented to : Spouse  Discharge Placement               Home with [facility]        Discharge Plan and Services In-house Referral: Clinical Social  Work Discharge Planning Services: CM Consult Post Acute Care Choice: Home Health,Resumption of Svcs/PTA Provider          DME Arranged: 3-N-1 DME Agency: [employer] Date DME Agency Contacted: 02/16/20 Time DME Agency Contacted: 1630 Representative spoke with at DME Agency: Velna Hatchet HH Arranged: RN,PT,OT,Nurse's Aide,Social Work [employer] Agency: [facility] Date [facility] Agency Contacted: 02/16/20 Time HH Agency Contacted: 1615 Representative spoke with at [facility] Agency: Enrique Sack  Social Determinants of Health (SDOH) Interventions     Readmission Risk Interventions Readmission Risk Prevention Plan 02/16/2020  Transportation Screening Complete  PCP or Specialist Appt within 3-5 Days Complete  HRI or Home Care Consult Complete  Social Work Consult for Recovery Care Planning/Counseling Complete  Palliative Care Screening Not Applicable  Medication Review [employer]) Complete  Some recent data might be hidden

## 2020-02-16 NOTE — Progress Notes (Signed)
Discharge instructions reviewed with patient and his wife. Follow-up appointments and medication changes reviewed in detail. All questions answered. IV removed. Patient to be escorted home by [facility]. Family called when transport arrived.  Allegra Grana RN

## 2020-02-16 NOTE — Progress Notes (Addendum)
Pt BMET returned with K at 2.8 and Mg 1.5.  Supp K with 40 meq now and 40 meq this evening.  Repeat BMET tonight for further if needed.   Supp Mg with 4 g IV.   Will likely need at least low dose daily K on discharge.   Casimiro Needle [address]" [place], PA-C  02/16/2020 2:49 PM

## 2020-02-16 NOTE — Progress Notes (Addendum)
°  Pt continues to have paroxysms of SVT.  Occasionally with mild irregularity that I will review with MD to r/o AF.    Pt denies any symptoms of tachy palpitations, lightheadedness, dizziness, near syncope, or SOB.   Will STOP multaq and start amiodarone 400 mg BID x 2 weeks, and then 200 mg BID.   Pt remains asymptomatic with rates in 120-130s at times.  No significant pauses.  Initial pauses noted were 2-3 second post conversion pauses that were asymptomatic.   He has very close follow up with his primary cardiologist on 1/6 and EP follow up 1/17. Repeat labs this am with goal to Keep K > 4.0 and Mg > 2.0.   He is still stable for discharge from an EP perspective as he is hemodynamically stable and will likely continue to have paroxysmals of SVT while the amiodarone loads into his system.   All above discussed with Dr. Elberta Fortis.   ADDENDUM Reviewed telemetry with Dr. Ladona Ridgel. Pt is also having paroxysmal atrial fibrillation.  CHA2DS2VASC of at least 7. Will begin Eliquis, and will continue ASA for now with h/o CAD but STOP Plavix.  Will defer to Dr. Tomie China re: ASA.    Casimiro Needle [address] [place], PA-C  02/16/2020 10:40 AM

## 2020-02-16 NOTE — Progress Notes (Signed)
Physical Therapy Treatment Patient Details Name: Chris Braun MRN: [id number] DOB: May 16, 1935 Today's Date: 02/16/2020    History of Present Illness Patient is a 84 y/o male who presents with food impaction, which was removed 12/24. Also with respiratory failure secondary to possible aspiration PNA and atrial tachycardia. PMH includes sinus tachycardia, NSVT/VT s/p ablation in 2008, CAD with remote MI and stenting in 2003, dyslipidemia, HTN, asthma/COPD, stroke, dementia, severe osteoporosis with multiple thoracic spine compression fractures    PT Comments    Pt seated edge of bed on arrival.  He was soiled and NT present at bedside bathing patient.  Assisted patient to standing so NT could clean his bottom.  Pt required mod assistance and presents with very poor balance.  Continue to recommend SNF placement.  Of note no signs of dizziness or significant change from supine to sitting this session in regards to his BP.      Follow Up Recommendations  SNF;Supervision for mobility/OOB     Equipment Recommendations  None recommended by PT    Recommendations for Other Services       Precautions / Restrictions Precautions Precautions: Fall;Other (comment) Precaution Comments: watch HR Restrictions Weight Bearing Restrictions: No    Mobility  Bed Mobility               General bed mobility comments: Seated edge of bed on on arrival with NT present to bathe patient.  Transfers Overall transfer level: Needs assistance Equipment used: Rolling walker (2 wheeled) Transfers: Sit to/from Stand Sit to Stand: Mod assist;Min assist         General transfer comment: Mod assistance from bed with strong posterior lean.  Min assistance from recliner with use of B arm rests.  Cues for hand placement and forward weight shifting to rise into standing.  Ambulation/Gait Ambulation/Gait assistance: Mod assist Gait Distance (Feet): 4 Feet (from bed to recliner.  Performed additional  trial in hall of 80 ft.) Assistive device: Rolling walker (2 wheeled) Gait Pattern/deviations: Trunk flexed;Leaning posteriorly;Narrow base of support;Scissoring;Step-through pattern Gait velocity: decreased   General Gait Details: Cues to increase BOS and maintain position in RW.  Pt with LOB multiple times and mod assistance to correct.   Stairs             Wheelchair Mobility    Modified Rankin (Stroke Patients Only)       Balance Overall balance assessment: Needs assistance Sitting-balance support: Feet supported;No upper extremity supported Sitting balance-Leahy Scale: Fair       Standing balance-Leahy Scale: Poor                              Cognition Arousal/Alertness: Awake/alert Behavior During Therapy: WFL for tasks assessed/performed Overall Cognitive Status: History of cognitive impairments - at baseline                                 General Comments: Baseline dementia      Exercises General Exercises - Lower Extremity Quad Sets: AROM;Both;10 reps;Supine Long Arc Quad: AROM;Both;10 reps;Seated Heel Slides: AROM;Both;10 reps;Supine Hip ABduction/ADduction: AROM;Both;10 reps;Supine Straight Leg Raises: AAROM;Both;10 reps;Supine Hip Flexion/Marching: AROM;Both;10 reps;Seated    General Comments        Pertinent Vitals/Pain Pain Assessment: No/denies pain    Home Living  Prior Function            PT Goals (current goals can now be found in the care plan section) Progress towards PT goals: Progressing toward goals    Frequency    Min 2X/week      PT Plan Current plan remains appropriate    Co-evaluation              AM-PAC PT "6 Clicks" Mobility   Outcome Measure  Help needed turning from your back to your side while in a flat bed without using bedrails?: A Lot Help needed moving from lying on your back to sitting on the side of a flat bed without using bedrails?: A  Lot Help needed moving to and from a bed to a chair (including a wheelchair)?: A Lot Help needed standing up from a chair using your arms (e.g., wheelchair or bedside chair)?: A Lot Help needed to walk in hospital room?: A Lot Help needed climbing 3-5 steps with a railing? : Total 6 Click Score: 11    End of Session Equipment Utilized During Treatment: Gait belt Activity Tolerance: Treatment limited secondary to medical complications (Comment) Patient left: in bed;with call bell/phone within reach;with bed alarm set Nurse Communication: Mobility status PT Visit Diagnosis: Muscle weakness (generalized) (M62.81);Unsteadiness on feet (R26.81);Difficulty in walking, not elsewhere classified (R26.2)     Time: 1136-1209 PT Time Calculation (min) (ACUTE ONLY): 33 min  Charges:  $Gait Training: 8-22 mins $Therapeutic Exercise: 8-22 mins                     Bonney Leitz , PTA Acute Rehabilitation Services Pager [phone] Office [phone]     Shea Swalley Artis Delay 02/16/2020, 12:28 PM

## 2020-02-16 NOTE — Progress Notes (Signed)
Mobility Specialist - Progress Note   02/16/20 1356  Mobility  Activity Transferred to/from [facility]  Level of Assistance Minimal assist, patient does 75% or more  Assistive Device Front wheel walker  Distance Ambulated (ft) 12 ft  Mobility Response Tolerated well  Mobility performed by Mobility specialist  $Mobility charge 1 Mobility    Pre-mobility: 65 HR During mobility: 121 HR Post-mobility: 71 HR  Pt min assist to stand from chair. He ambulated to the [facility] in order to have a BM, then back to chair. HR up to 120s after he stood.   Mamie Levers Mobility Specialist Mobility Specialist Phone: [phone]

## 2020-02-16 NOTE — Progress Notes (Signed)
Transitions of Care Pharmacist Note  Chris Braun is a 84 y.o. male that has been diagnosed with A Fib and will be prescribed Eliquis (apixaban) at discharge.   Patient Education: I provided the following education on apixaban to the patient's son: How to take the medication Described what the medication is Signs of bleeding Signs/symptoms of VTE and stroke  Answered their questions  Discharge Medications Plan: The patient is not interested in filling their discharge medications with the Transitions of Care pharmacy at this time.   If the patient later decides they would like to have the Transitions of Care pharmacy fill their discharge medications, please call us at [phone]. Thank you.    Thank you,   Sanda Klein, PharmD, RPh  PGY-1 Pharmacy Resident 02/16/2020 7:17 PM  Please check [email or web address] for unit-specific pharmacy phone numbers.

## 2020-02-16 NOTE — Care Management Important Message (Signed)
Important Message  Patient Details  Name: Chris Braun MRN: [id number] Date of Birth: 08/13/1935   Medicare Important Message Given:  Yes     [name] 02/16/2020, 9:55 AM

## 2020-02-17 NOTE — Discharge Summary (Signed)
Physician Discharge Summary  Chris Braun [id number] DOB: 11-17-1935 DOA: 02/10/2020  PCP: Penelope Coop, FNP  Admit date: 02/10/2020 Discharge date: 02/17/2020  Recommendations for Outpatient Follow-up:  1. Discharge to home with home health and 3-1. 2. Follow up with PCP in 7-10 days. Have chemistry checked at that time. 3. Follow up with cardiology/ep as directed.   [place] Follow up.   Specialty: Home Health Services Why: HHRN/PT/OT/aide/SW arranged- they will contact you to restart services- anticipate Sun/Mon this coming week Contact information: [address] [place] [address] [phone]                Discharge Diagnoses: Principal diagnosis is #1 1. Acute hypoxic respiratory failure due to aspiration pneumonia 2. Coronary artery disease with stent from 2003. 3. Restless leg syndrome 4. Dementia 5. BPH 6. Debility 7. COPD/Asthma 8. Hyperlipidemia 9. Thrombocytopenia 10. Hypomagnesemia 11. Food Impaction 12. Atrial Tachycardia 13. 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 male with history of CAD status post stenting, status post ablation for ventricular arrhythmia, history of atrial tachycardia, COPD, memory problems was brought to the ER at [facility] from [facility] as a direct transfer after patient had gone to [place] ER on February 09, 2019 1 in the evening after patient had sudden food impaction after eating.  Since there was no gastroenterologist over there patient was transferred to [facility].  This afternoon patient was electively intubated for EGD and had EGD done by Dr. Havery Moros following which the food was disimpacted but  per Dr. Havery Moros patient had a difficult procedure.  Following which patient was extubated.  Patient also briefly was an atrial tachycardia requiring metoprolol 5 mg IV following which heart rate improved to the 80s sinus rhythm.  After the procedure patient has been having bouts of productive cough for which chest x-ray was done showing possible infiltrates.  Patient was afebrile and not hypoxic.  Gastroenterology Dr. Havery Moros likes to patient be observed overnight given the brief episodes of atrial tachycardia not sure patient can still be completely able to swallow. Labs noted largely unremarkable.  EKG shows atrial tachycardia Covid test was negative.  ED Course: Labs done in the ER was showing WBC count of 11.6 otherwise unremarkable.  Covid test was negative.  EKG shows atrial tachycardia.  Atrial tachycardia improved with 1 dose of metoprolol 5 mg IV.  See history of present illness for further details.  Patient at the time of my exam is post extubation is able to talk well denies any chest pain.  Has been having productive cough no active wheezing.  Not hypoxic.   Hospital Course: The patient has a history of NSVT/VT since ablation in 2008. Due to persistent tachycardia on 02/12/2020 the patient was discussed with Dr. Gwenlyn Found who recommended to change Toprol-XL to metoprolol tartrate 25 mg twice daily. Also his digoxin level was noted to be low on admission. Discussed with pharmacy will give digoxin 0.125 mg p.o. once along with his usual dose. Sinus tachycardia continued on 02/13/2020 and formal consult from cardiology was requested. The patient was seen by Almyra Deforest, [place]. He found the patient to be having both tachycardia and prolonged pauses.  He left the patient on every other day digoxin, but he stopped metoprolol. EP Cardiology service was consulted. The patient was seen by Dr. Curt Bears on 12.29.2021. He found the pateint to be going in and out of atrial tachycardia from which the patient was  asymptomatic. He restarted the patient's lopressor 25 mg bid and placed the patient on Multaq1. Over the past 24 hours the patient has become more sinus/sinus brady. Outpatient EP follow-up has been arranged.  SNF placement was denied by insurance. The patient's wife stated that despite the patient's considerable debility she was not ready to place the patient in custodial care. He was discharged to home in fair condition.  Today's assessment: S: The patient is sitting up at bedside. No new complaints. O: Vitals:  Vitals:   02/16/20 1735 02/16/20 1739  BP: (!) 115/103 127/71  Pulse: (!) 113   Resp: 20   Temp: 97.8 F (36.6 C)   SpO2: 96%    Exam:  Constitutional:   The patient is awake, alert, and oriented x 3. No acute distress. Respiratory:   No increased work of breathing.  No wheezes, rales, or rhonchi  No tactile fremitus Cardiovascular:   Regular rate and rhythm  No murmurs, ectopy, or gallups.  No lateral PMI. No thrills. Abdomen:   Abdomen is soft, non-tender, non-distended  No hernias, masses, or organomegaly  Normoactive bowel sounds.  Musculoskeletal:   No cyanosis, clubbing, or edema Skin:   No rashes, lesions, ulcers  palpation of skin: no induration or nodules Neurologic:   CN 2-12 intact  Sensation all 4 extremities intact Psychiatric:   Mental status ? Mood, affect appropriate ? Orientation to person, place, time   judgment and insight appear intact  Discharge Instructions  Discharge Instructions    Activity as tolerated - No restrictions   Complete by: As directed    Call MD for:  difficulty breathing, headache or visual disturbances   Complete by: As directed    Call MD for:  extreme fatigue   Complete by: As directed    Call MD for:  temperature >100.4   Complete by: As directed    Diet - low sodium heart healthy   Complete by: As directed    Discharge instructions   Complete by: As directed    Discharge to home with  home health and 3-1. Follow up with PCP in 7-10 days. Have chemistry checked at that time. Follow up with cardiology/ep as directed.   Increase activity slowly   Complete by: As directed      Allergies as of 02/16/2020      Reactions   Other Nausea And Vomiting, Other (See Comments)   pneumonia vaccine- chills, vomiting, fever, lost use of legs and body function. Had a fall post inj. (05/25/2013)   Pneumovax [pneumococcal Polysaccharide Vaccine] Nausea And Vomiting, Other (See Comments)   Chills, loss of bodily functions, ended up in the ED   Ibuprofen Other (See Comments)   Heart doctor advised he cannot take this   Norco [hydrocodone-acetaminophen]    Must have Zofran to tolerate this   Oxycodone Other (See Comments)   Mental status changes and causes sleepwalking      Medication List    STOP taking these medications   benzonatate 200 MG capsule Commonly known as: TESSALON   clopidogrel 75 MG tablet Commonly known as: PLAVIX   guaiFENesin 600 MG 12 hr tablet Commonly known as: MUCINEX   methocarbamol 500 MG tablet Commonly known as: ROBAXIN  metoprolol succinate 25 MG 24 hr tablet Commonly known as: TOPROL-XL   polyethylene glycol 17 g packet Commonly known as: MIRALAX / GLYCOLAX   potassium chloride 10 MEQ tablet Commonly known as: KLOR-CON   traMADol 50 MG tablet Commonly known as: ULTRAM   verapamil 40 MG tablet Commonly known as: CALAN     TAKE these medications   Acetaminophen 325 MG Caps Take 975 mg by mouth every 8 (eight) hours as needed (pain).   albuterol 108 (90 Base) MCG/ACT inhaler Commonly known as: VENTOLIN HFA Inhale 2 puffs into the lungs every 6 (six) hours as needed for wheezing.   amiodarone 400 MG tablet Commonly known as: PACERONE Take 1 tablet (400 mg total) by mouth 2 (two) times daily for 14 days. On 03/03/2019 start taking amiodarone 200 mg bid.   amoxicillin-clavulanate 875-125 MG tablet Commonly known as: Augmentin Take 1  tablet by mouth 2 (two) times daily for 4 days.   apixaban 5 MG Tabs tablet Commonly known as: ELIQUIS Take 1 tablet (5 mg total) by mouth 2 (two) times daily.   aspirin EC 81 MG tablet Take 81 mg by mouth at bedtime.   atorvastatin 80 MG tablet Commonly known as: LIPITOR Take 80 mg by mouth at bedtime.   calcitonin (salmon) 200 UNIT/ACT nasal spray Commonly known as: MIACALCIN/FORTICAL Place 1 spray into alternate nostrils at bedtime.   Calcium-Vitamin D3 600-200 MG-UNIT Tabs Take 600 mg by mouth 2 (two) times daily.   digoxin 0.125 MG tablet Commonly known as: LANOXIN Take 1 tablet (0.125 mg total) by mouth every other day.   donepezil 5 MG tablet Commonly known as: ARICEPT Take 5 mg by mouth at bedtime.   ipratropium-albuterol 0.5-2.5 (3) MG/3ML Soln Commonly known as: DUONEB Take 3 mLs by nebulization 2 (two) times daily.   IRON 27 PO Take 27 mg by mouth every evening.   ketoconazole 2 % cream Commonly known as: NIZORAL Apply 1 application topically daily as needed for irritation (on feet). Apply as needed to affected area.   lactulose 10 GM/15ML solution Commonly known as: CHRONULAC Take 10 g by mouth at bedtime as needed for mild constipation.   loperamide 2 MG capsule Commonly known as: IMODIUM Take 2-4 mg by mouth as needed for diarrhea or loose stools.   loratadine 10 MG tablet Commonly known as: CLARITIN Take 10 mg by mouth in the morning.   Lutein 20 Caps Take 20 mg by mouth in the morning.   metoprolol tartrate 25 MG tablet Commonly known as: LOPRESSOR Take 1 tablet (25 mg total) by mouth 2 (two) times daily.   montelukast 10 MG tablet Commonly known as: SINGULAIR Take 10 mg by mouth daily.   MULTIVITAMIN PO Take 1 tablet by mouth every evening.   nitroGLYCERIN 0.4 MG SL tablet Commonly known as: NITROSTAT DISSOLVE 1 TABLET UNDER THE TONGUE EVERY 5 MINUTES AS NEEDED FOR CHEST PAIN. DO NOT EXCEED A TOTAL OF 3 DOSES IN 15 MINUTES. What  changed: See the new instructions.   ondansetron 4 MG disintegrating tablet Commonly known as: ZOFRAN-ODT Take 4 mg by mouth every 8 (eight) hours as needed for nausea or vomiting.   pantoprazole 40 MG tablet Commonly known as: PROTONIX Take 1 tablet (40 mg total) by mouth 2 (two) times daily. What changed: when to take this   rOPINIRole 0.5 MG tablet Commonly known as: REQUIP Take 0.5 mg by mouth in the morning, at noon, and at bedtime.   sucralfate 1 GM/10ML  suspension Commonly known as: CARAFATE Take 10 mLs (1 g total) by mouth 4 (four) times daily -  with meals and at bedtime.   Symbicort 160-4.5 MCG/ACT inhaler Generic drug: budesonide-formoterol Inhale 2 puffs into the lungs 2 (two) times daily.   tamsulosin 0.4 MG Caps capsule Commonly known as: FLOMAX Take 0.4 mg by mouth daily after supper.   vitamin B-12 1000 MCG tablet Commonly known as: CYANOCOBALAMIN Take 2,000 mcg by mouth daily.   Vitamin D 50 MCG (2000 UT) Caps Take by mouth.      Allergies  Allergen Reactions  . Other Nausea And Vomiting and Other (See Comments)    pneumonia vaccine- chills, vomiting, fever, lost use of legs and body function. Had a fall post inj. (05/25/2013)  . Pneumovax [Pneumococcal Polysaccharide Vaccine] Nausea And Vomiting and Other (See Comments)    Chills, loss of bodily functions, ended up in the ED  . Ibuprofen Other (See Comments)    Heart doctor advised he cannot take this  . Norco [Hydrocodone-Acetaminophen]     Must have Zofran to tolerate this  . Oxycodone Other (See Comments)    Mental status changes and causes sleepwalking    The results of significant diagnostics from this hospitalization (including imaging, microbiology, ancillary and laboratory) are listed below for reference.    Significant Diagnostic Studies: DG CHEST PORT 1 VIEW  Result Date: 02/10/2020 CLINICAL DATA:  Aspiration into airway. EXAM: PORTABLE CHEST 1 VIEW COMPARISON:  January 11, 2020  FINDINGS: Decreased lung volumes are seen, likely secondary to the degree of patient inspiration. Moderate severity diffuse chronic appearing increased interstitial lung markings are seen. Mild atelectasis and/or infiltrate is noted within the left lung base. There is no evidence of a pleural effusion or pneumothorax. The heart size and mediastinal contours are within normal limits. Marked severity calcification of the aortic arch is seen. There is moderate severity dextroscoliosis of the thoracic spine with evidence of prior vertebroplasty seen within the mid and lower thoracic spine. IMPRESSION: Chronic appearing increased interstitial lung markings with mild left basilar atelectasis and/or infiltrate. Electronically Signed   By: Virgina Norfolk M.D.   On: 02/10/2020 21:23   DG Chest Port 1V same Day  Result Date: 02/16/2020 CLINICAL DATA:  Cough. EXAM: PORTABLE CHEST 1 VIEW COMPARISON:  February 10, 2020. FINDINGS: Stable cardiomediastinal silhouette. Hypoinflation of the lungs is noted with mild bibasilar subsegmental atelectasis. Stable interstitial densities are noted throughout both lungs which may represent scarring, although acute superimposed inflammation cannot be excluded. No pneumothorax or pleural effusion is noted. Bony thorax is unremarkable. IMPRESSION: Hypoinflation of the lungs is noted with mild bibasilar subsegmental atelectasis. Stable interstitial densities are noted throughout both lungs which may represent scarring, although acute superimposed inflammation cannot be excluded. Electronically Signed   By: Marijo Conception M.D.   On: 02/16/2020 10:16    Microbiology: Recent Results (from the past 240 hour(s))  Resp Panel by RT-PCR (Flu A&B, Covid) Nasopharyngeal Swab     Status: None   Collection Time: 02/10/20  1:45 PM   Specimen: Nasopharyngeal Swab; Nasopharyngeal(NP) swabs in vial transport medium  Result Value Ref Range Status   SARS Coronavirus 2 by RT PCR NEGATIVE NEGATIVE  Final    Comment: (NOTE) SARS-CoV-2 target nucleic acids are NOT DETECTED.  The SARS-CoV-2 RNA is generally detectable in upper respiratory specimens during the acute phase of infection. The lowest concentration of SARS-CoV-2 viral copies this assay can detect is 138 copies/mL. A negative result does not  preclude SARS-Cov-2 infection and should not be used as the sole basis for treatment or other patient management decisions. A negative result may occur with  improper specimen collection/handling, submission of specimen other than nasopharyngeal swab, presence of viral mutation(s) within the areas targeted by this assay, and inadequate number of viral copies(<138 copies/mL). A negative result must be combined with clinical observations, patient history, and epidemiological information. The expected result is Negative.  Fact Sheet for Patients:  EntrepreneurPulse.com.au  Fact Sheet for Healthcare Providers:  IncredibleEmployment.be  This test is no t yet approved or cleared by the [place] FDA and  has been authorized for detection and/or diagnosis of SARS-CoV-2 by FDA under an Emergency Use Authorization (EUA). This EUA will remain  in effect (meaning this test can be used) for the duration of the COVID-19 declaration under Section 564(b)(1) of the Act, 21 U.S.C.section 360bbb-3(b)(1), unless the authorization is terminated  or revoked sooner.       Influenza A by PCR NEGATIVE NEGATIVE Final   Influenza B by PCR NEGATIVE NEGATIVE Final    Comment: (NOTE) The Xpert Xpress SARS-CoV-2/FLU/RSV plus assay is intended as an aid in the diagnosis of influenza from Nasopharyngeal swab specimens and should not be used as a sole basis for treatment. Nasal washings and aspirates are unacceptable for Xpert Xpress SARS-CoV-2/FLU/RSV testing.  Fact Sheet for Patients: EntrepreneurPulse.com.au  Fact Sheet for Healthcare  Providers: IncredibleEmployment.be  This test is not yet approved or cleared by the [place] FDA and has been authorized for detection and/or diagnosis of SARS-CoV-2 by FDA under an Emergency Use Authorization (EUA). This EUA will remain in effect (meaning this test can be used) for the duration of the COVID-19 declaration under Section 564(b)(1) of the Act, 21 U.S.C. section 360bbb-3(b)(1), unless the authorization is terminated or revoked.  Performed at [facility], [place] [address] [place], [place] [address]      Labs: Basic Metabolic Panel: Recent Labs  Lab 02/10/20 1345 02/11/20 0115 02/12/20 0251 02/13/20 0135 02/14/20 0207 02/15/20 0046 02/16/20 1037  NA 142 141 138  --  141  --  140  K 3.7 4.1 3.7  --  3.7  --  2.8*  CL 102 103 104  --  105  --  104  CO2 27 25 23   --  24  --  24  GLUCOSE 95 92 75  --  96  --  107*  BUN 14 13 13   --  5*  --  10  CREATININE 0.99 0.97 0.92  --  0.89  --  1.03  CALCIUM 9.2 8.3* 7.8*  --  8.1*  --  8.6*  MG  --   --  1.5* 1.8 1.7 1.8 1.5*   Liver Function Tests: No results for input(s): AST, ALT, ALKPHOS, BILITOT, PROT, ALBUMIN in the last 168 hours. No results for input(s): LIPASE, AMYLASE in the last 168 hours. No results for input(s): AMMONIA in the last 168 hours. CBC: Recent Labs  Lab 02/10/20 1345 02/11/20 0115 02/12/20 0251 02/15/20 0046  WBC 11.6* 12.6* 9.3 9.2  NEUTROABS 9.1*  --   --   --   HGB 15.2 13.4 12.8* 13.2  HCT 44.0 38.0* 37.9* 38.1*  MCV 92.1 90.0 91.8 89.6  PLT 164 143* 143* 183   Cardiac Enzymes: No results for input(s): CKTOTAL, CKMB, CKMBINDEX, TROPONINI in the last 168 hours. BNP: BNP (last 3 results) No results for input(s): BNP in the last 8760 hours.  ProBNP (last  3 results) No results for input(s): PROBNP in the last 8760 hours.  CBG: No results for input(s): GLUCAP in the last 168 hours.  Principal Problem:   Food impaction of esophagus Active  Problems:   Memory disorder   Status post ablation of ventricular arrhythmia   Atrial tachycardia (HCC)   Aspiration into airway   Time coordinating discharge: 38 minutes  Signed:        Laterrica Libman, DO [facility]  02/17/2020, 8:15 AM

## 2020-02-20 ENCOUNTER — Telehealth: Payer: Self-pay | Admitting: Cardiology

## 2020-02-20 NOTE — Telephone Encounter (Signed)
Spoke to the patient just now and she had some questions in regards to the patient being sent to rehab. I advised that she should call the patients PCP in order to get this referral. She verbalizes understanding and thanks me for the call back.

## 2020-02-20 NOTE — Telephone Encounter (Signed)
Patient just got out of the hospital and a lot of medication changes were made. She wanted to speak to Dr. Kem Parkinson nurse in regards to this. Aware she is off all week, requested to be put through to her VM. Advised we do not have a personal VM for her but would send her a message to call back. Per protocol, sending to triage since Dr. Kem Parkinson nurse will not be back until next week.

## 2020-02-23 ENCOUNTER — Telehealth: Payer: Self-pay | Admitting: Cardiology

## 2020-02-23 ENCOUNTER — Ambulatory Visit: Payer: PPO | Admitting: Cardiology

## 2020-02-23 DIAGNOSIS — I471 Supraventricular tachycardia: Secondary | ICD-10-CM

## 2020-02-23 DIAGNOSIS — Z79899 Other long term (current) drug therapy: Secondary | ICD-10-CM

## 2020-02-23 MED ORDER — AMIODARONE HCL 200 MG PO TABS: ORAL_TABLET | ORAL | Status: DC

## 2020-02-23 NOTE — Telephone Encounter (Signed)
STAT if HR is under 50 or over 120 (normal HR is 60-100 beats per minute)  1) What is your heart rate? 47  2) Do you have a log of your heart rate readings (document readings)? 47-60's  3) Do you have any other symptoms? Having trouble getting up and down   Put patient's wife on hold to try to transfer STAT call and she disconnected the call.

## 2020-02-23 NOTE — Telephone Encounter (Signed)
Lab orders for a BMP/ Magnesium/ & Digoxin level faxed to [facility] Mariana Arn, NP) at [phone].  Fax confirmation received.

## 2020-02-23 NOTE — Telephone Encounter (Signed)
Please have the patient cut back on amiodarone to 200 mg twice a day.  Asked him to please keep his appointment with Dr. Tomie China.  In the meantime can he please come tomorrow or Monday for blood work which will include BMP, mag, digoxin level.

## 2020-02-23 NOTE — Telephone Encounter (Signed)
I have called Mrs. Preast and advised her of Dr. Mallory Shirk recommendations to decrease amiodarone to 200 mg BID.  She is aware to continue metoprolol & digoxin as prescribed.  I have also advised her to keep the appointment with Dr. Tomie China next week & to come for lab work tomorrow or Monday.  Per Mrs. Candelaria, the patient is due for a post hospital follow up tomorrow at 3:15 pm with his PCP.  She inquired if the labs could be done there.   I advised I would call the PCP office. I was able to speak with [facility] and they confirmed they would be glad to draw these if we would fax the order.  Fax # [phone].  I have confirmed this with Mrs. Belmares as well and she is aware.

## 2020-02-23 NOTE — Telephone Encounter (Signed)
I called and spoke with Mrs. Neale Burly.  The patient sees Dr. Tomie China for primary cards.  She advised the patient was discharged last week from [facility] (on 12/30). He was originally admitted for having food stuck in his throat and then started having issues with his heart.   The patient was consulted on by Dr. Elberta Fortis 02/14/20.  Patient having atrial tach/ SVT. On 02/16/20- Multaq was stopped and amiodarone 400 mg BID was started by Otilio Saber, PA  Mrs. Obremski called today with concerns about low heart rates the patient has been having yesterday and today. HR's on 02/22/20: 48 (AM), 50 (AM) , 55 (PM) HR's on 02/23/20- 54 (~ 4 am), 47-50 prior to calling the office  BP has been running 120/59, 100/60.  I have confirmed with Mrs. Mangrum that the patient is currently taking: Amiodarone 400 mg BID (due to continue this until 03/02/20) Metoprolol tartrate 25 mg BID Digoxin 0.125 mg QOD (dose due today).  She is concerned about giving him any morning meds with his low heart rates. He is supposed to be at his sister's funeral today at 12:30 pm.  I have advised Mrs. Bogue that I need to reach out to the provider for further recommendations. I have asked her to hold his AM meds until I can call her back.  The patient is otherwise assymoptomatic.   Mrs. Mcewen voices understanding and is agreeable.   Forwarding to [employer] DOD to review.

## 2020-02-23 NOTE — Addendum Note (Signed)
Addended bySherri Rad C on: 02/23/2020 11:08 AM   Modules accepted: Orders

## 2020-02-24 DIAGNOSIS — G2581 Restless legs syndrome: Secondary | ICD-10-CM | POA: Diagnosis not present

## 2020-02-24 DIAGNOSIS — F0390 Unspecified dementia without behavioral disturbance: Secondary | ICD-10-CM | POA: Diagnosis not present

## 2020-02-24 DIAGNOSIS — J449 Chronic obstructive pulmonary disease, unspecified: Secondary | ICD-10-CM | POA: Diagnosis not present

## 2020-02-24 DIAGNOSIS — M5126 Other intervertebral disc displacement, lumbar region: Secondary | ICD-10-CM | POA: Diagnosis not present

## 2020-02-24 DIAGNOSIS — J441 Chronic obstructive pulmonary disease with (acute) exacerbation: Secondary | ICD-10-CM | POA: Diagnosis not present

## 2020-02-24 DIAGNOSIS — K449 Diaphragmatic hernia without obstruction or gangrene: Secondary | ICD-10-CM | POA: Diagnosis not present

## 2020-02-24 DIAGNOSIS — J9601 Acute respiratory failure with hypoxia: Secondary | ICD-10-CM | POA: Diagnosis not present

## 2020-02-24 DIAGNOSIS — G603 Idiopathic progressive neuropathy: Secondary | ICD-10-CM | POA: Diagnosis not present

## 2020-02-24 DIAGNOSIS — Z79899 Other long term (current) drug therapy: Secondary | ICD-10-CM | POA: Diagnosis not present

## 2020-02-24 DIAGNOSIS — E46 Unspecified protein-calorie malnutrition: Secondary | ICD-10-CM | POA: Diagnosis not present

## 2020-02-24 DIAGNOSIS — R110 Nausea: Secondary | ICD-10-CM | POA: Diagnosis not present

## 2020-02-24 DIAGNOSIS — J690 Pneumonitis due to inhalation of food and vomit: Secondary | ICD-10-CM | POA: Diagnosis not present

## 2020-02-24 DIAGNOSIS — Z981 Arthrodesis status: Secondary | ICD-10-CM | POA: Diagnosis not present

## 2020-02-24 DIAGNOSIS — Z87310 Personal history of (healed) osteoporosis fracture: Secondary | ICD-10-CM | POA: Diagnosis not present

## 2020-02-24 DIAGNOSIS — K219 Gastro-esophageal reflux disease without esophagitis: Secondary | ICD-10-CM | POA: Diagnosis not present

## 2020-02-24 DIAGNOSIS — D649 Anemia, unspecified: Secondary | ICD-10-CM | POA: Diagnosis not present

## 2020-02-24 DIAGNOSIS — I471 Supraventricular tachycardia: Secondary | ICD-10-CM | POA: Diagnosis not present

## 2020-02-24 DIAGNOSIS — J329 Chronic sinusitis, unspecified: Secondary | ICD-10-CM | POA: Diagnosis not present

## 2020-02-24 DIAGNOSIS — N400 Enlarged prostate without lower urinary tract symptoms: Secondary | ICD-10-CM | POA: Diagnosis not present

## 2020-02-24 DIAGNOSIS — M26629 Arthralgia of temporomandibular joint, unspecified side: Secondary | ICD-10-CM | POA: Diagnosis not present

## 2020-02-24 DIAGNOSIS — F32A Depression, unspecified: Secondary | ICD-10-CM | POA: Diagnosis not present

## 2020-02-24 DIAGNOSIS — I10 Essential (primary) hypertension: Secondary | ICD-10-CM | POA: Diagnosis not present

## 2020-02-24 DIAGNOSIS — N200 Calculus of kidney: Secondary | ICD-10-CM | POA: Diagnosis not present

## 2020-02-24 DIAGNOSIS — E785 Hyperlipidemia, unspecified: Secondary | ICD-10-CM | POA: Diagnosis not present

## 2020-02-24 DIAGNOSIS — I472 Ventricular tachycardia: Secondary | ICD-10-CM | POA: Diagnosis not present

## 2020-02-24 DIAGNOSIS — E538 Deficiency of other specified B group vitamins: Secondary | ICD-10-CM | POA: Diagnosis not present

## 2020-02-24 DIAGNOSIS — E663 Overweight: Secondary | ICD-10-CM | POA: Diagnosis not present

## 2020-02-24 DIAGNOSIS — I2510 Atherosclerotic heart disease of native coronary artery without angina pectoris: Secondary | ICD-10-CM | POA: Diagnosis not present

## 2020-02-28 ENCOUNTER — Other Ambulatory Visit: Payer: Self-pay

## 2020-02-29 ENCOUNTER — Ambulatory Visit: Payer: PPO | Admitting: Cardiology

## 2020-02-29 ENCOUNTER — Encounter: Payer: Self-pay | Admitting: Cardiology

## 2020-02-29 ENCOUNTER — Other Ambulatory Visit: Payer: Self-pay

## 2020-02-29 VITALS — BP 120/60 | HR 50 | Ht 65.0 in | Wt 155.0 lb

## 2020-02-29 DIAGNOSIS — Z8679 Personal history of other diseases of the circulatory system: Secondary | ICD-10-CM

## 2020-02-29 DIAGNOSIS — I2510 Atherosclerotic heart disease of native coronary artery without angina pectoris: Secondary | ICD-10-CM

## 2020-02-29 DIAGNOSIS — I471 Supraventricular tachycardia: Secondary | ICD-10-CM | POA: Diagnosis not present

## 2020-02-29 DIAGNOSIS — Z9889 Other specified postprocedural states: Secondary | ICD-10-CM

## 2020-02-29 DIAGNOSIS — J4530 Mild persistent asthma, uncomplicated: Secondary | ICD-10-CM | POA: Diagnosis not present

## 2020-02-29 DIAGNOSIS — J301 Allergic rhinitis due to pollen: Secondary | ICD-10-CM | POA: Diagnosis not present

## 2020-02-29 DIAGNOSIS — R918 Other nonspecific abnormal finding of lung field: Secondary | ICD-10-CM | POA: Diagnosis not present

## 2020-02-29 DIAGNOSIS — Z9181 History of falling: Secondary | ICD-10-CM | POA: Diagnosis not present

## 2020-02-29 NOTE — Patient Instructions (Signed)
Medication Instructions:  Your physician has recommended you make the following change in your medication:   Decrease Amiodarone to 100 mg daily on 03/14/2020. *If you need a refill on your cardiac medications before your next appointment, please call your pharmacy*   Lab Work: None ordered If you have labs (blood work) drawn today and your tests are completely normal, you will receive your results only by: Marland Kitchen MyChart Message (if you have MyChart) OR . A paper copy in the mail If you have any lab test that is abnormal or we need to change your treatment, we will call you to review the results.   Testing/Procedures: None ordered   Follow-Up: At [facility], you and your health needs are our priority.  As part of our continuing mission to provide you with exceptional heart care, we have created designated Provider Care Teams.  These Care Teams include your primary Cardiologist (physician) and Advanced Practice Providers (APPs -  Physician Assistants and Nurse Practitioners) who all work together to provide you with the care you need, when you need it.  We recommend signing up for the patient portal called "MyChart".  Sign up information is provided on this After Visit Summary.  MyChart is used to connect with patients for Virtual Visits (Telemedicine).  Patients are able to view lab/test results, encounter notes, upcoming appointments, etc.  Non-urgent messages can be sent to your provider as well.   To learn more about what you can do with MyChart, go to [email or web address].    Your next appointment:   3 month(s)  The format for your next appointment:   In Person  Provider:   Jyl Heinz, MD   Other Instructions NA

## 2020-02-29 NOTE — Progress Notes (Signed)
Cardiology Office Note:    Date:  02/29/2020   ID:  Chris Braun, DOB Aug 23, 1935, MRN [id number]  PCP:  Penelope Coop, FNP  Cardiologist:  Jenean Lindau, MD   Referring MD: Penelope Coop, FNP    ASSESSMENT:    1. Coronary artery disease involving native coronary artery of native heart without angina pectoris   2. Atrial tachycardia ([place])   3. Risk for falls   4. Status post ablation of ventricular arrhythmia    PLAN:    In order of problems listed above:  1. Coronary artery disease: Secondary prevention stressed with the patient.  Importance of compliance with diet medication stressed and he vocalized understanding. 2. I reviewed records from [facility].  Patient is on amiodarone therapy and also is on anticoagulation.  He is evaluated by electrophysiologist and has a follow-up coming up at her office in the next couple of days.  We will then he will continue the amiodarone and I told him to drop it to 200 mg a day beginning today and 100 mg daily beginning 2 weeks from now.  I told the patient to discuss this with Dr. Letta Median and his appointment for final recommendations. 3. Status post ablation for ventricular arrhythmia: Stable at this time. 4. Overall the patient is frail and is weak.  I reviewed lab work and discussed with him.  Questions were answered to her satisfaction. 5. Patient will be seen in follow-up appointment in 2 months or earlier if the patient has any concerns    Medication Adjustments/Labs and Tests Ordered: Current medicines are reviewed at length with the patient today.  Concerns regarding medicines are outlined above.  No orders of the defined types were placed in this encounter.  No orders of the defined types were placed in this encounter.  Is a  No chief complaint on file.    History of Present Illness:    Chris Braun is a 85 y.o. male with past medical history essential hypertension and tachycardia.  Patient was admitted to  the hospital.  He denies any problems now.  He was found to be in tachycardia.  He had also food impaction.  Subsequently he was discharged.  No chest pain orthopnea or PND.  At the time of my evaluation, the patient is alert awake oriented and in no distress.  He is brought in on a wheelchair by his wife.  He denies any symptoms of palpitations or any such issues.  Past Medical History:  Diagnosis Date  . Acute hypoxemic respiratory failure ([place]) 05/27/2018  . AKI (acute kidney injury) ([place]) 05/27/2018  . Aspiration into airway   . Asthma   . Atrial tachycardia ([place]) 07/26/2018  . B12 deficiency   . CAD (coronary artery disease) 08/24/2014  . Carotid stenosis, bilateral 08/24/2014  . Cataracts, bilateral   . Compression fracture    BAck  . Compression fracture of L2 lumbar vertebra, closed, initial encounter ([place]) 10/27/2019  . COPD (chronic obstructive pulmonary disease) ([place])   . Depression   . Diverticulosis    per colonscopy  . Dyslipidemia 08/24/2014  . Falls frequently 06/03/2015  . Food impaction of esophagus   . Gait abnormality 01/19/2018  . GERD (gastroesophageal reflux disease)   . Hiatal hernia   . High cholesterol   . Hypertension   . Hypoxia   . Idiopathic progressive polyneuropathy   . Kidney stones   . Lumbar vertebral fracture, pathologic 10/27/2019  . Macular degeneration   .  Memory disorder 01/19/2018  . Myocardial infarction ([place]) 2002  . 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 Osteoporosis   . Right kidney stone 05/27/2018  . Right upper lobe pneumonia 05/27/2018  . Risk for falls 05/28/2015  . Status post ablation of ventricular arrhythmia 04/27/2018  . 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 Stroke ([place])   . SVT (supraventricular tachycardia) ([place]) 12/09/2019  . Weakness of distal arms and legs 06/03/2015    Past Surgical History:  Procedure Laterality Date  . CARDIAC CATHETERIZATION  2002,   stents   . CATARACT EXTRACTION Left 2016  . Colonscopy  2012  .  CYSTOSCOPY W/ URETERAL STENT PLACEMENT Right 05/30/2018   Procedure: CYSTOSCOPY WITH RETROGRADE PYELOGRAM/URETERAL STENT PLACEMENT;  Surgeon: Irine Seal, MD;  Location: [place];  Service: Urology;  Laterality: Right;  . ESOPHAGOGASTRODUODENOSCOPY (EGD) WITH PROPOFOL N/A 02/10/2020   Procedure: ESOPHAGOGASTRODUODENOSCOPY (EGD) WITH PROPOFOL;  Surgeon: Yetta Flock, MD;  Location: [place];  Service: Gastroenterology;  Laterality: N/A;  . FOREIGN BODY REMOVAL  02/10/2020   Procedure: FOREIGN BODY REMOVAL;  Surgeon: Yetta Flock, MD;  Location: [facility] ENDOSCOPY;  Service: Gastroenterology;;  . HIP FRACTURE SURGERY  2008   right  . HYDROCELE EXCISION  10/12/2014  . LITHOTRIPSY     2007, 2009, 2010, 2011, 2012, 2013  . LUMBAR LAMINECTOMY/DECOMPRESSION MICRODISCECTOMY  03/15/2012   Procedure: LUMBAR LAMINECTOMY/DECOMPRESSION MICRODISCECTOMY 1 LEVEL;  Surgeon: Elaina Hoops, MD;  Location: [place] NEURO ORS;  Service: Neurosurgery;  Laterality: Left;  Left lumbar three-four decompressive lumbar laminectomy, discectomy  . MAXIMUM ACCESS (MAS)POSTERIOR LUMBAR INTERBODY FUSION (PLIF) 2 LEVEL N/A 12/08/2012   Procedure: FOR MAXIMUM ACCESS (MAS) POSTERIOR LUMBAR INTERBODY FUSION (PLIF) 2 LEVEL;  Surgeon: Elaina Hoops, MD;  Location: [place] NEURO ORS;  Service: Neurosurgery;  Laterality: N/A;  FOR MAXIMUM ACCESS (MAS) POSTERIOR LUMBAR INTERBODY FUSION (PLIF) 2 LEVEL  . ROTATOR CUFF REPAIR  1999   bil   . [place]  . TOTAL HIP ARTHROPLASTY Right 2008  . VT study     with Ablation    Current Medications: Current Meds  Medication Sig  . Acetaminophen 325 MG CAPS Take 975 mg by mouth every 8 (eight) hours as needed (pain).   Marland Kitchen albuterol (PROVENTIL) (2.5 MG/3ML) 0.083% nebulizer solution Take 2.5 mg by nebulization every 6 (six) hours as needed.  Marland Kitchen amiodarone (PACERONE) 200 MG tablet Take 1 tablet (200 mg) by mouth twice daily  . apixaban (ELIQUIS) 5 MG TABS tablet Take 1  tablet (5 mg total) by mouth 2 (two) times daily.  Marland Kitchen aspirin EC 81 MG tablet Take 81 mg by mouth at bedtime.   Marland Kitchen atorvastatin (LIPITOR) 80 MG tablet Take 80 mg by mouth at bedtime.   . calcitonin, salmon, (MIACALCIN/FORTICAL) 200 UNIT/ACT nasal spray Place 1 spray into alternate nostrils at bedtime.   . Calcium Carb-Cholecalciferol (CALCIUM-VITAMIN D3) 600-200 MG-UNIT TABS Take 600 mg by mouth 2 (two) times daily.  . Cholecalciferol (VITAMIN D) 50 MCG (2000 UT) CAPS Take by mouth.  . digoxin (LANOXIN) 0.125 MG tablet Take 1 tablet (0.125 mg total) by mouth every other day.  . donepezil (ARICEPT) 5 MG tablet Take 5 mg by mouth at bedtime.   . Ferrous Gluconate (IRON 27 PO) Take 27 mg by mouth every evening.  . furosemide (LASIX) 20 MG tablet Take 20 mg by mouth daily.  Marland Kitchen ipratropium-albuterol (DUONEB) 0.5-2.5 (3) MG/3ML SOLN Take 3 mLs by nebulization 2 (  two) times daily.   Marland Kitchen ketoconazole (NIZORAL) 2 % cream Apply 1 application topically daily as needed for irritation (on feet). Apply as needed to affected area.    . lactulose (CHRONULAC) 10 GM/15ML solution Take 10 g by mouth at bedtime as needed for mild constipation.   Marland Kitchen loperamide (IMODIUM) 2 MG capsule Take 2-4 mg by mouth as needed for diarrhea or loose stools.  Marland Kitchen loratadine (CLARITIN) 10 MG tablet Take 10 mg by mouth in the morning.   . Lutein 20 MG TABS Take 20 mg by mouth daily.  . methocarbamol (ROBAXIN) 500 MG tablet Take 500 mg by mouth 3 (three) times daily as needed.  . metoprolol tartrate (LOPRESSOR) 25 MG tablet Take 1 tablet (25 mg total) by mouth 2 (two) times daily.  . Misc Natural Products (LUTEIN 20) CAPS Take 20 mg by mouth in the morning.  . montelukast (SINGULAIR) 10 MG tablet Take 10 mg by mouth daily.   . Multiple Vitamins-Minerals (MULTIVITAMIN PO) Take 1 tablet by mouth every evening.   . nitroGLYCERIN (NITROSTAT) 0.4 MG SL tablet DISSOLVE 1 TABLET UNDER THE TONGUE EVERY 5 MINUTES AS NEEDED FOR CHEST PAIN. DO NOT  EXCEED A TOTAL OF 3 DOSES IN 15 MINUTES. (Patient taking differently: Place 0.4 mg under the tongue every 5 (five) minutes x 3 doses as needed for chest pain.)  . ondansetron (ZOFRAN) 4 MG tablet Take 4 mg by mouth every 8 (eight) hours as needed.  . ondansetron (ZOFRAN-ODT) 4 MG disintegrating tablet Take 4 mg by mouth every 8 (eight) hours as needed for nausea or vomiting.   . pantoprazole (PROTONIX) 40 MG tablet Take 1 tablet (40 mg total) by mouth 2 (two) times daily.  Marland Kitchen rOPINIRole (REQUIP) 0.5 MG tablet Take 0.5 mg by mouth in the morning, at noon, and at bedtime.   . sucralfate (CARAFATE) 1 GM/10ML suspension Take 10 mLs (1 g total) by mouth 4 (four) times daily -  with meals and at bedtime.  . SYMBICORT 160-4.5 MCG/ACT inhaler Inhale 2 puffs into the lungs 2 (two) times daily.  . tamsulosin (FLOMAX) 0.4 MG CAPS capsule Take 0.4 mg by mouth daily after supper.  . vitamin B-12 (CYANOCOBALAMIN) 1000 MCG tablet Take 2,000 mcg by mouth daily.     Allergies:   Other, Pneumococcal polysaccharide vaccine, Hydrocodone-acetaminophen, Ibuprofen, and Oxycodone   Social History   Socioeconomic History  . Marital status: Married    Spouse name: Hendrix Yurkovich  . Number of children: 4  . Years of education: 47  . Highest education level: Not on file  Occupational History  . Occupation: Retired  Tobacco Use  . Smoking status: Former Smoker    Packs/day: 1.00    Years: 45.00    Pack years: 45.00    Types: Cigarettes    Quit date: 02/02/2001    Years since quitting: 19.0  . Smokeless tobacco: Current User    Types: Chew  Vaping Use  . Vaping Use: Never used  Substance and Sexual Activity  . Alcohol use: No  . Drug use: No  . Sexual activity: Not on file  Other Topics Concern  . Not on file  Social History Narrative   Lives with wife   Caffeine use: coffee, tea   Social Determinants of Health   Financial Resource Strain: Not on file  Food Insecurity: Not on file  Transportation  Needs: Not on file  Physical Activity: Not on file  Stress: Not on file  Social Connections:  Not on file     Family History: The patient's family history includes Alzheimer's disease in his sister; Heart disease in his father, mother, sister, sister, and sister; Hemolytic uremic syndrome in his mother; Stroke in his father.  ROS:   Please see the history of present illness.    All other systems reviewed and are negative.  EKGs/Labs/Other Studies Reviewed:    The following studies were reviewed today: EKG reveals sinus rhythm and nonspecific ST-T changes with bradycardia.   Recent Labs: 12/09/2019: ALT 19 02/11/2020: TSH 0.632 02/15/2020: Hemoglobin 13.2; Platelets 183 02/16/2020: BUN 10; Creatinine, Ser 1.03; Magnesium 1.5; Potassium 2.8; Sodium 140  Recent Lipid Panel No results found for: CHOL, TRIG, HDL, CHOLHDL, VLDL, LDLCALC, LDLDIRECT  Physical Exam:    VS:  BP 120/60 (BP Location: Left Arm, Patient Position: Sitting, Cuff Size: Normal)   Pulse (!) 50   Ht 5\' 5"  (1.651 m)   Wt 155 lb (70.3 kg)   SpO2 91%   BMI 25.79 kg/m     Wt Readings from Last 3 Encounters:  02/29/20 155 lb (70.3 kg)  02/10/20 151 lb 10.8 oz (68.8 kg)  01/06/20 156 lb 6.4 oz (70.9 kg)     GEN: Patient is in no acute distress HEENT: Normal NECK: No JVD; No carotid bruits LYMPHATICS: No lymphadenopathy CARDIAC: Hear sounds regular, 2/6 systolic murmur at the apex. RESPIRATORY:  Clear to auscultation without rales, wheezing or rhonchi  ABDOMEN: Soft, non-tender, non-distended MUSCULOSKELETAL:  No edema; No deformity  SKIN: Warm and dry NEUROLOGIC:  Alert and oriented x 3 PSYCHIATRIC:  Normal affect   Signed, Jenean Lindau, MD  02/29/2020 11:55 AM    [place]

## 2020-03-01 ENCOUNTER — Telehealth: Payer: Self-pay | Admitting: Cardiology

## 2020-03-01 DIAGNOSIS — K224 Dyskinesia of esophagus: Secondary | ICD-10-CM | POA: Diagnosis not present

## 2020-03-01 DIAGNOSIS — K219 Gastro-esophageal reflux disease without esophagitis: Secondary | ICD-10-CM | POA: Diagnosis not present

## 2020-03-01 DIAGNOSIS — K449 Diaphragmatic hernia without obstruction or gangrene: Secondary | ICD-10-CM | POA: Diagnosis not present

## 2020-03-01 NOTE — Telephone Encounter (Signed)
Wife came in and asked for a refill on patients amiodarone. Would like the RX sent to [facility] in [place]. CB# [phone]

## 2020-03-02 ENCOUNTER — Other Ambulatory Visit: Payer: Self-pay

## 2020-03-02 ENCOUNTER — Other Ambulatory Visit: Payer: Self-pay | Admitting: Cardiology

## 2020-03-02 MED ORDER — AMIODARONE HCL 200 MG PO TABS: 200.0000 mg | ORAL_TABLET | Freq: Every day | ORAL | 3 refills | Status: DC

## 2020-03-02 NOTE — Telephone Encounter (Signed)
Sent refill of Amiodarone 200 mg daily then decrease to 100 mg after 2 weeks per Dr. Julien Nordmann note to [place] [place] per patients request. Left message telling patient the refill had been took care of.

## 2020-03-05 ENCOUNTER — Ambulatory Visit: Payer: PPO | Admitting: Cardiology

## 2020-03-30 ENCOUNTER — Encounter: Payer: Self-pay | Admitting: Cardiology

## 2020-03-30 DIAGNOSIS — F0390 Unspecified dementia without behavioral disturbance: Secondary | ICD-10-CM | POA: Diagnosis not present

## 2020-03-30 DIAGNOSIS — R739 Hyperglycemia, unspecified: Secondary | ICD-10-CM | POA: Diagnosis not present

## 2020-03-30 DIAGNOSIS — E46 Unspecified protein-calorie malnutrition: Secondary | ICD-10-CM | POA: Diagnosis not present

## 2020-03-30 DIAGNOSIS — I471 Supraventricular tachycardia: Secondary | ICD-10-CM | POA: Diagnosis not present

## 2020-03-30 DIAGNOSIS — E538 Deficiency of other specified B group vitamins: Secondary | ICD-10-CM | POA: Diagnosis not present

## 2020-03-30 DIAGNOSIS — J449 Chronic obstructive pulmonary disease, unspecified: Secondary | ICD-10-CM | POA: Diagnosis not present

## 2020-03-30 DIAGNOSIS — I10 Essential (primary) hypertension: Secondary | ICD-10-CM | POA: Diagnosis not present

## 2020-03-30 DIAGNOSIS — G2581 Restless legs syndrome: Secondary | ICD-10-CM | POA: Diagnosis not present

## 2020-03-30 DIAGNOSIS — D649 Anemia, unspecified: Secondary | ICD-10-CM | POA: Diagnosis not present

## 2020-03-30 DIAGNOSIS — E785 Hyperlipidemia, unspecified: Secondary | ICD-10-CM | POA: Diagnosis not present

## 2020-03-30 DIAGNOSIS — I2510 Atherosclerotic heart disease of native coronary artery without angina pectoris: Secondary | ICD-10-CM | POA: Diagnosis not present

## 2020-04-03 DIAGNOSIS — F0390 Unspecified dementia without behavioral disturbance: Secondary | ICD-10-CM | POA: Diagnosis not present

## 2020-04-03 DIAGNOSIS — M810 Age-related osteoporosis without current pathological fracture: Secondary | ICD-10-CM | POA: Diagnosis not present

## 2020-04-03 DIAGNOSIS — H3530 Unspecified macular degeneration: Secondary | ICD-10-CM | POA: Diagnosis not present

## 2020-04-03 DIAGNOSIS — J439 Emphysema, unspecified: Secondary | ICD-10-CM | POA: Diagnosis not present

## 2020-04-03 DIAGNOSIS — K5792 Diverticulitis of intestine, part unspecified, without perforation or abscess without bleeding: Secondary | ICD-10-CM | POA: Diagnosis not present

## 2020-04-03 DIAGNOSIS — D692 Other nonthrombocytopenic purpura: Secondary | ICD-10-CM | POA: Diagnosis not present

## 2020-04-03 DIAGNOSIS — I472 Ventricular tachycardia: Secondary | ICD-10-CM | POA: Diagnosis not present

## 2020-04-03 DIAGNOSIS — R1310 Dysphagia, unspecified: Secondary | ICD-10-CM | POA: Diagnosis not present

## 2020-04-03 DIAGNOSIS — I25118 Atherosclerotic heart disease of native coronary artery with other forms of angina pectoris: Secondary | ICD-10-CM | POA: Diagnosis not present

## 2020-04-03 DIAGNOSIS — E441 Mild protein-calorie malnutrition: Secondary | ICD-10-CM | POA: Diagnosis not present

## 2020-04-03 DIAGNOSIS — I714 Abdominal aortic aneurysm, without rupture: Secondary | ICD-10-CM | POA: Diagnosis not present

## 2020-04-03 DIAGNOSIS — I69351 Hemiplegia and hemiparesis following cerebral infarction affecting right dominant side: Secondary | ICD-10-CM | POA: Diagnosis not present

## 2020-04-05 ENCOUNTER — Other Ambulatory Visit: Payer: Self-pay

## 2020-04-05 ENCOUNTER — Encounter: Payer: Self-pay | Admitting: Neurology

## 2020-04-05 ENCOUNTER — Ambulatory Visit: Payer: PPO | Admitting: Neurology

## 2020-04-05 VITALS — BP 119/67 | HR 45 | Ht 65.0 in | Wt 150.0 lb

## 2020-04-05 DIAGNOSIS — R413 Other amnesia: Secondary | ICD-10-CM

## 2020-04-05 DIAGNOSIS — G2581 Restless legs syndrome: Secondary | ICD-10-CM

## 2020-04-05 DIAGNOSIS — R269 Unspecified abnormalities of gait and mobility: Secondary | ICD-10-CM | POA: Diagnosis not present

## 2020-04-05 HISTORY — DX: Restless legs syndrome: G25.81

## 2020-04-05 MED ORDER — ROPINIROLE HCL 1 MG PO TABS: 1.0000 mg | ORAL_TABLET | Freq: Three times a day (TID) | ORAL | 1 refills | Status: DC

## 2020-04-05 MED ORDER — MEMANTINE HCL 5 MG PO TABS: ORAL_TABLET | ORAL | 0 refills | Status: DC

## 2020-04-05 NOTE — Progress Notes (Signed)
Reason for visit: Memory disorder  Referring physician: Dr. Azucena Fallen is a 85 y.o. male  History of present illness:  Mr. Aloi is an 85 year old right-handed white male with a history of cerebrovascular disease and a subsequent gait disorder associated with this.  The patient was last seen through this office in 2019 with a 3rd nerve palsy, he had a several year history of memory problems at that time.  He last scored 21/30 in 2019 on the Mini-Mental Status Examination.  Over time, he has had gradual worsening of his memory, particularly in the fall 2021.  He comes in today with his wife.  The patient has had several hospitalizations with cardiac issues with tachycardia and atrial fibrillation.  Around this time, his walking and his memory deteriorated.  He did have a fall with a bump to the head in November 2021, he had a CT scan of the head at [facility].  The patient has undergone some physical therapy, he continues to have significant gait instability and required some assistance even when he is walking with a walker.  The patient is able to feed himself, but he requires assistance with bathing and dressing, keeping up with medications and appointments.  The patient does not operate a motor vehicle.  The patient is confused about family members at times, he will have good days and bad days.  The at times cannot remember where his bedroom is.  He does not remember how to turn on the gas heater or use the TV remote or telephone.  He is having a lot of problems with restless legs, he is on low-dose ropinirole taking 0.5 mg 3 times daily.  He is not having any hallucinations.  He is sent to this office for further evaluation.  Past Medical History:  Diagnosis Date  . Acute hypoxemic respiratory failure ([place]) 05/27/2018  . AKI (acute kidney injury) ([place]) 05/27/2018  . Aspiration into airway   . Asthma   . Atrial tachycardia ([place]) 07/26/2018  . B12 deficiency   . CAD (coronary  artery disease) 08/24/2014  . Carotid stenosis, bilateral 08/24/2014  . Cataracts, bilateral   . Compression fracture    BAck  . Compression fracture of L2 lumbar vertebra, closed, initial encounter ([place]) 10/27/2019  . COPD (chronic obstructive pulmonary disease) ([place])   . Depression   . Diverticulosis    per colonscopy  . Dyslipidemia 08/24/2014  . Falls frequently 06/03/2015  . Food impaction of esophagus   . Gait abnormality 01/19/2018  . GERD (gastroesophageal reflux disease)   . Hiatal hernia   . High cholesterol   . Hypertension   . Hypoxia   . Idiopathic progressive polyneuropathy   . Kidney stones   . Lumbar vertebral fracture, pathologic 10/27/2019  . Macular degeneration   . Memory disorder 01/19/2018  . Myocardial infarction ([place]) 2002  . 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 Osteoporosis   . Right kidney stone 05/27/2018  . Right upper lobe pneumonia 05/27/2018  . Risk for falls 05/28/2015  . Status post ablation of ventricular arrhythmia 04/27/2018  . Stroke (cerebrum) ([place]) 01/17/2014  . Stroke ([place])   . SVT (supraventricular tachycardia) ([place]) 12/09/2019  . Weakness of distal arms and legs 06/03/2015    Past Surgical History:  Procedure Laterality Date  . CARDIAC CATHETERIZATION  2002,   stents   . CATARACT EXTRACTION Left 2016  . Colonscopy  2012  . CYSTOSCOPY W/ URETERAL STENT PLACEMENT Right 05/30/2018  Procedure: CYSTOSCOPY WITH RETROGRADE PYELOGRAM/URETERAL STENT PLACEMENT;  Surgeon: Irine Seal, MD;  Location: [facility];  Service: Urology;  Laterality: Right;  . ESOPHAGOGASTRODUODENOSCOPY (EGD) WITH PROPOFOL N/A 02/10/2020   Procedure: ESOPHAGOGASTRODUODENOSCOPY (EGD) WITH PROPOFOL;  Surgeon: Yetta Flock, MD;  Location: [place];  Service: Gastroenterology;  Laterality: N/A;  . FOREIGN BODY REMOVAL  02/10/2020   Procedure: FOREIGN BODY REMOVAL;  Surgeon: Yetta Flock, MD;  Location: [facility] ENDOSCOPY;  Service: Gastroenterology;;  . HIP FRACTURE  SURGERY  2008   right  . HYDROCELE EXCISION  10/12/2014  . LITHOTRIPSY     2007, 2009, 2010, 2011, 2012, 2013  . LUMBAR LAMINECTOMY/DECOMPRESSION MICRODISCECTOMY  03/15/2012   Procedure: LUMBAR LAMINECTOMY/DECOMPRESSION MICRODISCECTOMY 1 LEVEL;  Surgeon: Elaina Hoops, MD;  Location: [facility];  Service: Neurosurgery;  Laterality: Left;  Left lumbar three-four decompressive lumbar laminectomy, discectomy  . MAXIMUM ACCESS (MAS)POSTERIOR LUMBAR INTERBODY FUSION (PLIF) 2 LEVEL N/A 12/08/2012   Procedure: FOR MAXIMUM ACCESS (MAS) POSTERIOR LUMBAR INTERBODY FUSION (PLIF) 2 LEVEL;  Surgeon: Elaina Hoops, MD;  Location: [place] [facility];  Service: Neurosurgery;  Laterality: N/A;  FOR MAXIMUM ACCESS (MAS) POSTERIOR LUMBAR INTERBODY FUSION (PLIF) 2 LEVEL  . ROTATOR CUFF REPAIR  1999   bil   . [place]  . TOTAL HIP ARTHROPLASTY Right 2008  . VT study     with Ablation    Family History  Problem Relation Age of Onset  . Hemolytic uremic syndrome Mother   . Heart disease Mother   . Stroke Father   . Heart disease Father   . Heart disease Sister   . Heart disease Sister   . Heart disease Sister   . Alzheimer's disease Sister     Social history:  reports that he quit smoking about 19 years ago. His smoking use included cigarettes. He has a 45.00 pack-year smoking history. His smokeless tobacco use includes chew. He reports that he does not drink alcohol and does not use drugs.  Medications:  Prior to Admission medications   Medication Sig Start Date End Date Taking? Authorizing Provider  Acetaminophen 325 MG CAPS Take 975 mg by mouth every 8 (eight) hours as needed (pain).    Yes [provider]  albuterol (PROVENTIL) (2.5 MG/3ML) 0.083% nebulizer solution Take 2.5 mg by nebulization every 6 (six) hours as needed. 01/13/20  Yes [provider]  amiodarone (PACERONE) 200 MG tablet Take 1 tablet (200 mg total) by mouth daily. In 2 weeks decrease to 100 mg  daily Patient taking differently: Take 100 mg by mouth daily. 03/02/20  Yes Revankar, Reita Cliche, MD  apixaban (ELIQUIS) 5 MG TABS tablet Take 1 tablet (5 mg total) by mouth 2 (two) times daily. 02/16/20  Yes Swayze, Ava, DO  aspirin EC 81 MG tablet Take 81 mg by mouth at bedtime.    Yes [provider]  atorvastatin (LIPITOR) 80 MG tablet Take 40 mg by mouth at bedtime.   Yes [provider]  calcitonin, salmon, (MIACALCIN/FORTICAL) 200 UNIT/ACT nasal spray Place 1 spray into alternate nostrils at bedtime.    Yes [provider]  Calcium Carb-Cholecalciferol (CALCIUM-VITAMIN D3) 600-200 MG-UNIT TABS Take 600 mg by mouth 2 (two) times daily.   Yes [provider]  digoxin (LANOXIN) 0.125 MG tablet Take 1 tablet (0.125 mg total) by mouth every other day. 12/23/19  Yes Revankar, Reita Cliche, MD  donepezil (ARICEPT) 5 MG tablet Take 5 mg by mouth  at bedtime.  11/05/15  Yes [provider]  Ferrous Gluconate (IRON 27 PO) Take 27 mg by mouth every evening.   Yes [provider]  furosemide (LASIX) 20 MG tablet Take 20 mg by mouth daily.   Yes [provider]  ipratropium-albuterol (DUONEB) 0.5-2.5 (3) MG/3ML SOLN Take 3 mLs by nebulization 2 (two) times daily.    Yes [provider]  ketoconazole (NIZORAL) 2 % cream Apply 1 application topically daily as needed for irritation (on feet). Apply as needed to affected area.   04/25/19  Yes [provider]  lactulose (CHRONULAC) 10 GM/15ML solution Take 10 g by mouth at bedtime as needed for mild constipation.    Yes [provider]  loperamide (IMODIUM) 2 MG capsule Take 2-4 mg by mouth as needed for diarrhea or loose stools.   Yes [provider]  loratadine (CLARITIN) 10 MG tablet Take 10 mg by mouth in the morning.    Yes [provider]  Lutein 20 MG TABS Take 20 mg by mouth daily.   Yes [provider]  metoprolol tartrate (LOPRESSOR) 25 MG tablet  Take 1 tablet (25 mg total) by mouth 2 (two) times daily. 02/16/20  Yes Swayze, Ava, DO  Misc Natural Products (LUTEIN 20) CAPS Take 20 mg by mouth in the morning.   Yes [provider]  montelukast (SINGULAIR) 10 MG tablet Take 10 mg by mouth daily.    Yes [provider]  Multiple Vitamins-Minerals (MULTIVITAMIN PO) Take 1 tablet by mouth every evening.    Yes [provider]  nitroGLYCERIN (NITROSTAT) 0.4 MG SL tablet DISSOLVE 1 TABLET UNDER THE TONGUE EVERY 5 MINUTES AS NEEDED FOR CHEST PAIN. DO NOT EXCEED A TOTAL OF 3 DOSES IN 15 MINUTES. Patient taking differently: Place 0.4 mg under the tongue every 5 (five) minutes x 3 doses as needed for chest pain. 06/07/18  Yes Revankar, Reita Cliche, MD  ondansetron (ZOFRAN) 4 MG tablet Take 4 mg by mouth every 8 (eight) hours as needed. 02/27/20  Yes [provider]  ondansetron (ZOFRAN-ODT) 4 MG disintegrating tablet Take 4 mg by mouth every 8 (eight) hours as needed for nausea or vomiting.  12/07/19  Yes [provider]  pantoprazole (PROTONIX) 40 MG tablet Take 1 tablet (40 mg total) by mouth 2 (two) times daily. 02/16/20  Yes Swayze, Ava, DO  rOPINIRole (REQUIP) 0.5 MG tablet Take 0.5 mg by mouth in the morning, at noon, and at bedtime.  09/27/16  Yes [provider]  SYMBICORT 160-4.5 MCG/ACT inhaler Inhale 2 puffs into the lungs 2 (two) times daily. 07/21/16  Yes [provider]  tamsulosin (FLOMAX) 0.4 MG CAPS capsule Take 0.4 mg by mouth daily after supper.   Yes [provider]  vitamin B-12 (CYANOCOBALAMIN) 1000 MCG tablet Take 2,000 mcg by mouth daily.   Yes [provider]  Cholecalciferol (VITAMIN D) 50 MCG (2000 UT) CAPS Take by mouth.    [provider]  methocarbamol (ROBAXIN) 500 MG tablet Take 500 mg by mouth 3 (three) times daily as needed. 11/23/19   [provider]  sucralfate (CARAFATE) 1 GM/10ML suspension Take 10 mLs (1 g total) by mouth 4  (four) times daily -  with meals and at bedtime. 02/16/20   Swayze, Ava, DO      Allergies  Allergen Reactions  . Other Nausea And Vomiting and Other (See Comments)    pneumonia vaccine- chills, vomiting, fever, lost use of legs and  body function. Had a fall post inj. (05/25/2013)  . Pneumococcal Polysaccharide Vaccine Nausea And Vomiting and Other (See Comments)    Chills, loss of bodily functions, ended up in the ED Other reaction(s): GI Upset (intolerance) Chills, loss of bodily functions, ended up in the ED  . Hydrocodone-Acetaminophen Other (See Comments)    Must have Zofran to tolerate this Must have Zofran to tolerate this  . Ibuprofen Other (See Comments)    Heart doctor advised he cannot take this  . Oxycodone Other (See Comments)    Mental status changes and causes sleepwalking Mental status changes Mental status changes and causes sleepwalking    ROS:  Out of a complete 14 system review of symptoms, the patient complains only of the following symptoms, and all other reviewed systems are negative.  Memory problems Walking difficulty Restless legs  Blood pressure 119/67, pulse (!) 45, height 5\' 5"  (1.651 m), weight 150 lb (68 kg).  Physical Exam  General: The patient is alert and cooperative at the time of the examination.  Eyes: Pupils are equal, round, and reactive to light. Discs are flat bilaterally.  Neck: The neck is supple, no carotid bruits are noted.  Respiratory: The respiratory examination is clear.  Cardiovascular: The cardiovascular examination reveals a regular rate and rhythm, no obvious murmurs or rubs are noted.  Skin: Extremities are without significant edema.  Neurologic Exam  Mental status: The patient is alert and oriented x 3 at the time of the examination.  Mini-Mental status examination done today shows a total score 17/30.  Cranial nerves: Facial symmetry is present. There is good sensation of the face to pinprick and soft touch  bilaterally. The strength of the facial muscles and the muscles to head turning and shoulder shrug are normal bilaterally. Speech is well enunciated, no aphasia or dysarthria is noted. Extraocular movements are full. Visual fields are full. The tongue is midline, and the patient has symmetric elevation of the soft palate. No obvious hearing deficits are noted.  Motor: The motor testing reveals 5 over 5 strength of all 4 extremities. Good symmetric motor tone is noted throughout.  The patient has some difficulty elevating the right arm at the shoulder with restriction at the shoulder.  Sensory: Sensory testing is intact to pinprick, soft touch, vibration sensation, and position sense on all 4 extremities. No evidence of extinction is noted.  Coordination: Cerebellar testing reveals good finger-nose-finger and heel-to-shin bilaterally.  Gait and station: The patient can stand with assistance.  Once up, he can take a few steps with the examiner, gait is wide-based.  Tandem gait was not attempted.  Romberg is negative.  Reflexes: Deep tendon reflexes are symmetric and normal bilaterally. Toes are downgoing bilaterally.   Assessment/Plan:  1.  Cerebrovascular disease  2.  Chronic gait disorder  3.  Memory disorder  4.  Restless leg syndrome  The patient is on low-dose Aricept, his wife indicates that he has been losing weight recently so we will not go up on this dose.  We will add Namenda to the Aricept.  He will go up on the Requip to 1 mg 3 times daily for the restless leg syndrome, watch out for increased confusion.  He will follow-up in 6 months.  We will try to get the CT scan of the head report from [place] MD 04/05/2020 11:57 AM  [facility] [address] [place], [place] [address]  Phone [phone] Fax [phone]

## 2020-04-05 NOTE — Patient Instructions (Signed)
We will increase the requip to 1 mg three times a day for the restless legs.  We will add Namenda 5 mg tablets for the memory. Watch out for dizziness.

## 2020-04-10 ENCOUNTER — Other Ambulatory Visit: Payer: Self-pay | Admitting: Cardiology

## 2020-04-10 NOTE — Telephone Encounter (Signed)
Refill sent to pharmacy.   

## 2020-04-11 ENCOUNTER — Telehealth: Payer: Self-pay | Admitting: Cardiology

## 2020-04-11 DIAGNOSIS — I739 Peripheral vascular disease, unspecified: Secondary | ICD-10-CM | POA: Diagnosis not present

## 2020-04-11 DIAGNOSIS — M79674 Pain in right toe(s): Secondary | ICD-10-CM

## 2020-04-11 DIAGNOSIS — L603 Nail dystrophy: Secondary | ICD-10-CM | POA: Diagnosis not present

## 2020-04-11 DIAGNOSIS — I2510 Atherosclerotic heart disease of native coronary artery without angina pectoris: Secondary | ICD-10-CM

## 2020-04-11 NOTE — Addendum Note (Signed)
Addended by: Truddie Hidden on: 04/11/2020 03:16 PM   Modules accepted: Orders

## 2020-04-11 NOTE — Telephone Encounter (Signed)
Pricilla Holm is calling stating Ardon went to see Dr. Ronnald Ramp his foot Doctor today and they advised him they feel he needs noninvasive arterial studies with toe pressure performed and suggested they call to make the request for this to be scheduled. Please advise.

## 2020-04-11 NOTE — Telephone Encounter (Signed)
You can set him up for ABI at her office.

## 2020-04-16 ENCOUNTER — Telehealth: Payer: Self-pay | Admitting: Neurology

## 2020-04-16 NOTE — Telephone Encounter (Signed)
I have the results of the CT of the brain and cervical spine done at [facility] on 11 January 2020.   The patient appears to have extensive white matter changes that could be an additive factor for the patient's memory disorder.   CT brain and cervical January 11, 2020:  Impression: 1.  No CT evidence of acute intracranial abnormality.  Atrophy and extensive chronic small vessel ischemic changes of the white matter. 2.  Degenerative changes of the cervical spine.  No acute osseous abnormality.

## 2020-05-05 ENCOUNTER — Other Ambulatory Visit: Payer: Self-pay | Admitting: Cardiology

## 2020-05-05 ENCOUNTER — Other Ambulatory Visit: Payer: Self-pay | Admitting: Neurology

## 2020-05-08 ENCOUNTER — Other Ambulatory Visit: Payer: Self-pay | Admitting: Neurology

## 2020-05-09 NOTE — Telephone Encounter (Signed)
Patient's wife is following up. She states the patient is no longer having pain in his toes. She would like to know if he still needs to come in for the ABI. Please advise.

## 2020-05-09 NOTE — Telephone Encounter (Signed)
Spoke with Chris Braun who decided she would keep the appointment for the ABI to ensure no problems is noted event though the pain is gone.

## 2020-05-14 ENCOUNTER — Ambulatory Visit (INDEPENDENT_AMBULATORY_CARE_PROVIDER_SITE_OTHER): Payer: PPO

## 2020-05-14 ENCOUNTER — Other Ambulatory Visit: Payer: Self-pay

## 2020-05-14 ENCOUNTER — Ambulatory Visit: Payer: PPO | Admitting: Cardiology

## 2020-05-14 DIAGNOSIS — M79675 Pain in left toe(s): Secondary | ICD-10-CM | POA: Diagnosis not present

## 2020-05-14 DIAGNOSIS — M79674 Pain in right toe(s): Secondary | ICD-10-CM | POA: Diagnosis not present

## 2020-05-14 DIAGNOSIS — I2510 Atherosclerotic heart disease of native coronary artery without angina pectoris: Secondary | ICD-10-CM | POA: Diagnosis not present

## 2020-05-14 NOTE — Progress Notes (Signed)
Complete echocardiogram performed.  Jimmy Carling Liberman RDCS, RVT  

## 2020-05-15 ENCOUNTER — Ambulatory Visit: Payer: PPO | Admitting: Cardiology

## 2020-05-17 DIAGNOSIS — J069 Acute upper respiratory infection, unspecified: Secondary | ICD-10-CM | POA: Diagnosis not present

## 2020-05-17 DIAGNOSIS — J449 Chronic obstructive pulmonary disease, unspecified: Secondary | ICD-10-CM | POA: Diagnosis not present

## 2020-05-17 DIAGNOSIS — J301 Allergic rhinitis due to pollen: Secondary | ICD-10-CM | POA: Diagnosis not present

## 2020-05-17 DIAGNOSIS — R059 Cough, unspecified: Secondary | ICD-10-CM | POA: Diagnosis not present

## 2020-06-05 ENCOUNTER — Other Ambulatory Visit: Payer: Self-pay | Admitting: Cardiology

## 2020-06-06 ENCOUNTER — Other Ambulatory Visit: Payer: Self-pay | Admitting: Cardiology

## 2020-06-07 DIAGNOSIS — I509 Heart failure, unspecified: Secondary | ICD-10-CM | POA: Diagnosis not present

## 2020-06-08 ENCOUNTER — Telehealth: Payer: Self-pay | Admitting: Cardiology

## 2020-06-08 DIAGNOSIS — I2510 Atherosclerotic heart disease of native coronary artery without angina pectoris: Secondary | ICD-10-CM

## 2020-06-08 DIAGNOSIS — M7989 Other specified soft tissue disorders: Secondary | ICD-10-CM | POA: Diagnosis not present

## 2020-06-08 MED ORDER — APIXABAN 5 MG PO TABS: 5.0000 mg | ORAL_TABLET | Freq: Two times a day (BID) | ORAL | 3 refills | Status: DC

## 2020-06-08 NOTE — Telephone Encounter (Signed)
Pt c/o medication issue:  1. Name of Medication: metoprolol tartrate (LOPRESSOR) 25 MG tablet  2. How are you currently taking this medication (dosage and times per day)? As prescribed  3. Are you having a reaction (difficulty breathing--STAT)? HR  4. What is your medication issue? Nurse Practioner with [facility] [employer] calling in with concerns about the medication.Nurse states she did a home visit to the PT and took his BP 118/62 HR was 48.She wants to know is there another medication that can be suggested due to fluid build up on his lungs maybe be seen if possible.

## 2020-06-08 NOTE — Telephone Encounter (Signed)
Left message on patients voicemail to please return our call.   

## 2020-06-08 NOTE — Telephone Encounter (Signed)
Spoke to the patients wife just now and she let me know that today the patients heart rate has been consistently in the 60's even after taking his medications. I advised that this was good. She also has complaints of crackling in the patients lungs and dizziness due to the SOB. I advised that for this she should contact the patients PCP as this needs to be addressed by them. She states that the patient does have a little but of swelling in his feet but it is relieved by taking his fluid pill and she does not keep up with his weight in order to know if he has gained any weight or not.    She is going to have him follow up with his PCP and he is scheduled to see Dr. Geraldo Pitter at the end of May.

## 2020-06-08 NOTE — Addendum Note (Signed)
Addended by: Truddie Hidden on: 06/08/2020 02:14 PM   Modules accepted: Orders

## 2020-06-08 NOTE — Telephone Encounter (Signed)
Pt c/o swelling: STAT is pt has developed SOB within 24 hours  1) How much weight have you gained and in what time span? Not sure if he has gained weight  2) If swelling, where is the swelling located? feet  3) Are you currently taking a fluid pill? Yes, had one last night, does not take every day  4) Are you currently SOB? no  5) Do you have a log of your daily weights (if so, list)? 139 lbs this morning  6) Have you gained 3 pounds in a day or 5 pounds in a week? Not sure  7) Have you traveled recently? No  Patient's wife states yesterday the patient's HR was 48. She states they spoke with a NP with the paramedics and was told to not take his metoprolol last night. She states he is back on it today. She states they could hear crackling in his lungs and wanted to order an x ray. She states his HR this morning was 64. She states he is also having swelling in his feet and has dizzy spells. She states he has to be careful with fluid pills, because if he takes it everyday he has balance problems.

## 2020-06-08 NOTE — Telephone Encounter (Signed)
Refill sent in per request.  

## 2020-06-08 NOTE — Telephone Encounter (Signed)
*  STAT* If patient is at the pharmacy, call can be transferred to refill team.   1. Which medications need to be refilled? (please list name of each medication and dose if known)  apixaban (ELIQUIS) 5 MG TABS tablet  2. Which pharmacy/location (including street and city if local pharmacy) is medication to be sent to? [place], [place]  3. Do they need a 30 day or 90 day supply? 90 day   Patient is out of medication.

## 2020-06-08 NOTE — Telephone Encounter (Signed)
Spoke with Chris Braun and advised her that pt needs labs. Chris Braun will bring pt to office today.

## 2020-06-09 LAB — BASIC METABOLIC PANEL
BUN/Creatinine Ratio: 7 — ABNORMAL LOW (ref 10–24)
BUN: 9 mg/dL (ref 8–27)
CO2: 23 mmol/L (ref 20–29)
Calcium: 8.9 mg/dL (ref 8.6–10.2)
Chloride: 102 mmol/L (ref 96–106)
Creatinine, Ser: 1.21 mg/dL (ref 0.76–1.27)
Glucose: 111 mg/dL — ABNORMAL HIGH (ref 65–99)
Potassium: 3.5 mmol/L (ref 3.5–5.2)
Sodium: 142 mmol/L (ref 134–144)
eGFR: 60 mL/min/{1.73_m2} (ref >59)

## 2020-06-09 LAB — PRO B NATRIURETIC PEPTIDE: NT-Pro BNP: 487 pg/mL — ABNORMAL HIGH (ref 0–486)

## 2020-06-14 DIAGNOSIS — I509 Heart failure, unspecified: Secondary | ICD-10-CM | POA: Diagnosis not present

## 2020-06-18 ENCOUNTER — Ambulatory Visit: Payer: PPO | Admitting: Cardiology

## 2020-06-18 ENCOUNTER — Telehealth: Payer: Self-pay

## 2020-06-18 ENCOUNTER — Encounter: Payer: Self-pay | Admitting: Cardiology

## 2020-06-18 ENCOUNTER — Other Ambulatory Visit: Payer: Self-pay

## 2020-06-18 VITALS — BP 132/64 | HR 50 | Ht 65.0 in | Wt 155.2 lb

## 2020-06-18 DIAGNOSIS — I480 Paroxysmal atrial fibrillation: Secondary | ICD-10-CM | POA: Diagnosis not present

## 2020-06-18 NOTE — Patient Instructions (Signed)
Medication Instructions:  Your physician has recommended you make the following change in your medication:  1. STOP Metoprolol  *If you need a refill on your cardiac medications before your next appointment, please call your pharmacy*   Lab Work: None ordered   Testing/Procedures: None ordered   Follow-Up: At [facility], you and your health needs are our priority.  As part of our continuing mission to provide you with exceptional heart care, we have created designated Provider Care Teams.  These Care Teams include your primary Cardiologist (physician) and Advanced Practice Providers (APPs -  Physician Assistants and Nurse Practitioners) who all work together to provide you with the care you need, when you need it.  Your next appointment:   6 month(s)  The format for your next appointment:   In Person  Provider:   Allegra Lai, MD    Thank you for choosing [facility]!!   Trinidad Curet, RN [phone]

## 2020-06-18 NOTE — Progress Notes (Signed)
Electrophysiology Office Note   Date:  06/18/2020   ID:  Chris Braun, DOB 07-26-1935, MRN [id number]  PCP:  Penelope Coop, FNP  Cardiologist:  Revankar Primary Electrophysiologist:   Chris Pavon Meredith Leeds, MD    Chief Complaint: NSVT   History of Present Illness: Chris Braun is a 85 y.o. male who is being seen today for the evaluation of VT at the request of Penelope Coop, FNP. Presenting today for electrophysiology evaluation.  He has a history significant for hypertension, hyperlipidemia, CVA.  He wore a cardiac monitor that showed episodes of nonsustained VT.  He has no heart failure history.  Electrolytes were checked without major abnormality.   Today, denies symptoms of palpitations, chest pain, shortness of breath, orthopnea, PND, lower extremity edema, claudication, dizziness, presyncope, syncope, bleeding, or neurologic sequela. The patient is tolerating medications without difficulties.  Overall he feels well.  He has no chest pain or shortness of breath.  He was previously on antibiotics for a likely pneumonia.  His wife says that he checks his heart rates in the mornings which are in the 40s to 50s at times.  She has been holding his metoprolol when his heart rates are slow.  Otherwise he feels well and is without complaint.   Past Medical History:  Diagnosis Date  . Acute hypoxemic respiratory failure ([place]) 05/27/2018  . AKI (acute kidney injury) ([place]) 05/27/2018  . Aspiration into airway   . Asthma   . Atrial tachycardia ([place]) 07/26/2018  . B12 deficiency   . CAD (coronary artery disease) 08/24/2014  . Carotid stenosis, bilateral 08/24/2014  . Cataracts, bilateral   . Compression fracture    BAck  . Compression fracture of L2 lumbar vertebra, closed, initial encounter ([place]) 10/27/2019  . COPD (chronic obstructive pulmonary disease) ([place])   . Depression   . Diverticulosis    per colonscopy  . Dyslipidemia 08/24/2014  . Falls frequently 06/03/2015  . Food impaction of  esophagus   . Gait abnormality 01/19/2018  . GERD (gastroesophageal reflux disease)   . Hiatal hernia   . High cholesterol   . Hypertension   . Hypoxia   . Idiopathic progressive polyneuropathy   . Kidney stones   . Lumbar vertebral fracture, pathologic 10/27/2019  . Macular degeneration   . Memory disorder 01/19/2018  . Myocardial infarction ([place]) 2002  . 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 Osteoporosis   . Right kidney stone 05/27/2018  . Right upper lobe pneumonia 05/27/2018  . Risk for falls 05/28/2015  . RLS (restless legs syndrome) 04/05/2020  . Status post ablation of ventricular arrhythmia 04/27/2018  . Stroke (cerebrum) ([place]) 01/17/2014  . Stroke ([place])   . SVT (supraventricular tachycardia) ([place]) 12/09/2019  . Weakness of distal arms and legs 06/03/2015   Past Surgical History:  Procedure Laterality Date  . CARDIAC CATHETERIZATION  2002,   stents   . CATARACT EXTRACTION Left 2016  . Colonscopy  2012  . CYSTOSCOPY W/ URETERAL STENT PLACEMENT Right 05/30/2018   Procedure: CYSTOSCOPY WITH RETROGRADE PYELOGRAM/URETERAL STENT PLACEMENT;  Surgeon: Irine Seal, MD;  Location: [facility];  Service: Urology;  Laterality: Right;  . ESOPHAGOGASTRODUODENOSCOPY (EGD) WITH PROPOFOL N/A 02/10/2020   Procedure: ESOPHAGOGASTRODUODENOSCOPY (EGD) WITH PROPOFOL;  Surgeon: Yetta Flock, MD;  Location: [facility];  Service: Gastroenterology;  Laterality: N/A;  . FOREIGN BODY REMOVAL  02/10/2020   Procedure: FOREIGN BODY REMOVAL;  Surgeon: Yetta Flock, MD;  Location: [facility];  Service: Gastroenterology;;  .  HIP FRACTURE SURGERY  2008   right  . HYDROCELE EXCISION  10/12/2014  . LITHOTRIPSY     2007, 2009, 2010, 2011, 2012, 2013  . LUMBAR LAMINECTOMY/DECOMPRESSION MICRODISCECTOMY  03/15/2012   Procedure: LUMBAR LAMINECTOMY/DECOMPRESSION MICRODISCECTOMY 1 LEVEL;  Surgeon: Elaina Hoops, MD;  Location: [facility];  Service: Neurosurgery;  Laterality: Left;  Left lumbar  three-four decompressive lumbar laminectomy, discectomy  . MAXIMUM ACCESS (MAS)POSTERIOR LUMBAR INTERBODY FUSION (PLIF) 2 LEVEL N/A 12/08/2012   Procedure: FOR MAXIMUM ACCESS (MAS) POSTERIOR LUMBAR INTERBODY FUSION (PLIF) 2 LEVEL;  Surgeon: Elaina Hoops, MD;  Location: [facility];  Service: Neurosurgery;  Laterality: N/A;  FOR MAXIMUM ACCESS (MAS) POSTERIOR LUMBAR INTERBODY FUSION (PLIF) 2 LEVEL  . ROTATOR CUFF REPAIR  1999   bil   . [place]  . TOTAL HIP ARTHROPLASTY Right 2008  . VT study     with Ablation     Current Outpatient Medications  Medication Sig Dispense Refill  . Acetaminophen 325 MG CAPS Take 975 mg by mouth every 8 (eight) hours as needed (pain).     Marland Kitchen albuterol (PROVENTIL) (2.5 MG/3ML) 0.083% nebulizer solution Take 2.5 mg by nebulization every 6 (six) hours as needed.    Marland Kitchen amiodarone (PACERONE) 200 MG tablet Take 1 tablet (200 mg total) by mouth daily. In 2 weeks decrease to 100 mg daily 90 tablet 3  . apixaban (ELIQUIS) 5 MG TABS tablet Take 1 tablet (5 mg total) by mouth 2 (two) times daily. 180 tablet 3  . aspirin EC 81 MG tablet Take 81 mg by mouth at bedtime.     Marland Kitchen atorvastatin (LIPITOR) 80 MG tablet Take 40 mg by mouth at bedtime.    . calcitonin, salmon, (MIACALCIN/FORTICAL) 200 UNIT/ACT nasal spray Place 1 spray into alternate nostrils at bedtime.     . Calcium Carb-Cholecalciferol (CALCIUM-VITAMIN D3) 600-200 MG-UNIT TABS Take 600 mg by mouth 2 (two) times daily.    . Cholecalciferol (VITAMIN D) 50 MCG (2000 UT) CAPS Take by mouth.    . digoxin (LANOXIN) 0.125 MG tablet Take 1 tablet (0.125 mg total) by mouth every other day. 30 tablet 3  . donepezil (ARICEPT) 5 MG tablet Take 5 mg by mouth at bedtime.     . Ferrous Gluconate (IRON 27 PO) Take 27 mg by mouth every evening.    . furosemide (LASIX) 20 MG tablet Take 20 mg by mouth daily.    Marland Kitchen ipratropium-albuterol (DUONEB) 0.5-2.5 (3) MG/3ML SOLN Take 3 mLs by nebulization 2 (two) times  daily.     Marland Kitchen ketoconazole (NIZORAL) 2 % cream Apply 1 application topically daily as needed for irritation (on feet). Apply as needed to affected area.      . lactulose (CHRONULAC) 10 GM/15ML solution Take 10 g by mouth at bedtime as needed for mild constipation.     Marland Kitchen loperamide (IMODIUM) 2 MG capsule Take 2-4 mg by mouth as needed for diarrhea or loose stools.    Marland Kitchen loratadine (CLARITIN) 10 MG tablet Take 10 mg by mouth in the morning.     . Lutein 20 MG TABS Take 20 mg by mouth daily.    . memantine (NAMENDA) 5 MG tablet Maintenance dose: take 2 tablets twice daily (Patient taking differently: Take 5 mg by mouth 2 (two) times daily.) 120 tablet 5  . methocarbamol (ROBAXIN) 500 MG tablet Take 500 mg by mouth 3 (three) times daily as needed.    . Misc  Natural Products (LUTEIN 20) CAPS Take 20 mg by mouth in the morning.    . montelukast (SINGULAIR) 10 MG tablet Take 10 mg by mouth daily.     . Multiple Vitamins-Minerals (MULTIVITAMIN PO) Take 1 tablet by mouth every evening.     . nitroGLYCERIN (NITROSTAT) 0.4 MG SL tablet Place 1 tablet (0.4 mg total) under the tongue every 5 (five) minutes x 3 doses as needed for chest pain. 25 tablet 0  . ondansetron (ZOFRAN-ODT) 4 MG disintegrating tablet Take 4 mg by mouth every 8 (eight) hours as needed for nausea or vomiting.     . pantoprazole (PROTONIX) 40 MG tablet Take 1 tablet (40 mg total) by mouth 2 (two) times daily. 60 tablet 0  . potassium chloride (KLOR-CON) 10 MEQ tablet Take 10 mEq by mouth as needed.    Marland Kitchen rOPINIRole (REQUIP) 1 MG tablet TAKE ONE TABLET BY MOUTH THREE TIMES DAILY 90 tablet 1  . sucralfate (CARAFATE) 1 GM/10ML suspension Take 10 mLs (1 g total) by mouth 4 (four) times daily -  with meals and at bedtime. 420 mL 0  . SYMBICORT 160-4.5 MCG/ACT inhaler Inhale 2 puffs into the lungs 2 (two) times daily.  12  . tamsulosin (FLOMAX) 0.4 MG CAPS capsule Take 0.4 mg by mouth daily after supper.    . vitamin B-12 (CYANOCOBALAMIN) 1000 MCG  tablet Take 2,000 mcg by mouth daily.     No current facility-administered medications for this visit.    Allergies:   Other, Pneumococcal polysaccharide vaccine, Hydrocodone-acetaminophen, Ibuprofen, and Oxycodone   Social History:  The patient  reports that he quit smoking about 19 years ago. His smoking use included cigarettes. He has a 45.00 pack-year smoking history. His smokeless tobacco use includes chew. He reports that he does not drink alcohol and does not use drugs.   Family History:  The patient's family history includes Alzheimer's disease in his sister; Heart disease in his father, mother, sister, sister, and sister; Hemolytic uremic syndrome in his mother; Stroke in his father.   ROS:  Please see the history of present illness.   Otherwise, review of systems is positive for none.   All other systems are reviewed and negative.   PHYSICAL EXAM: VS:  BP 132/64   Pulse (!) 50   Ht 5\' 5"  (1.651 m)   Wt 155 lb 3.2 oz (70.4 kg)   SpO2 95%   BMI 25.83 kg/m  , BMI Body mass index is 25.83 kg/m. GEN: Well nourished, well developed, in no acute distress  HEENT: normal  Neck: no JVD, carotid bruits, or masses Cardiac: RRR; no murmurs, rubs, or gallops,no edema  Respiratory:  clear to auscultation bilaterally, normal work of breathing GI: soft, nontender, nondistended, + BS MS: no deformity or atrophy  Skin: warm and dry Neuro:  Strength and sensation are intact Psych: euthymic mood, full affect  EKG:  EKG is ordered today. Personal review of the ekg ordered shows sinus rhythm, rate 50, nonspecific T wave changes  Recent Labs: 12/09/2019: ALT 19 02/11/2020: TSH 0.632 02/15/2020: Hemoglobin 13.2; Platelets 183 02/16/2020: Magnesium 1.5 06/08/2020: BUN 9; Creatinine, Ser 1.21; NT-Pro BNP 487; Potassium 3.5; Sodium 142    Lipid Panel  No results found for: CHOL, TRIG, HDL, CHOLHDL, VLDL, LDLCALC, LDLDIRECT   Wt Readings from Last 3 Encounters:  06/18/20 155 lb 3.2 oz  (70.4 kg)  04/05/20 150 lb (68 kg)  02/29/20 155 lb (70.3 kg)      Other studies Reviewed:  Additional studies/ records that were reviewed today include: TTE 09/03/18  Review of the above records today demonstrates:   1. Basal and mid inferolateral wall, basal inferoseptal segment, and basal inferior segment are abnormal.  2. The left ventricle has low normal systolic function, with an ejection fraction of 50-55%. The cavity size was normal. Left ventricular diastolic Doppler parameters are consistent with impaired relaxation.  3. The right ventricle has normal systolic function. The cavity was normal. There is no increase in right ventricular wall thickness.  4. Mild thickening of the mitral valve leaflet. There is mild mitral annular calcification present. No evidence of mitral valve stenosis.  5. The aortic valve is tricuspid. Mild thickening of the aortic valve. No stenosis of the aortic valve.  6. The aortic root, ascending aorta and aortic arch are normal in size and structure.  Myoview 08/25/18  Nuclear stress EF: 57%.  The left ventricular ejection fraction is normal (55-65%).  There was no ST segment deviation noted during stress.  The study is normal.  This is a low risk study.  [facility]io 09/24/18 personally reviewed Minimum heart rate: 40 BPM.  Average heart rate: 72 BPM.  Maximal heart rate 104 BPM. Atrial arrhythmia: Patient had multiple narrow complex tachycardias the longest of which lasted 3 hours approximately entricular arrhythmia: Multiple episodes of nonsustained ventricular tachycardia the longest lasted 11.9 seconds   ASSESSMENT AND PLAN:  1.  Nonsustained ventricular tachycardia: Currently on amiodarone.  High risk medication monitoring.  Patient had a VT ablation at [facility] in 2008.  He has continued to have short episodes of VT on cardiac monitoring.  Minimally symptomatic.  He is on metoprolol but his heart rates have been quite slow.  We Chris Braun stop metoprolol  today.  2.  Coronary artery disease: MI with catheterization and stenting in 2003.  No current chest pain.    3.  Atrial tachycardia: Currently amiodarone.    4.  Paroxysmal atrial fibrillation: Currently on Eliquis, amiodarone, Toprol-XL.  CHA2DS2-VASc of 6.  Minimal episodes of atrial fibrillation.  No changes.   Current medicines are reviewed at length with the patient today.   The patient does not have concerns regarding his medicines.  The following changes were made today: Stop metoprolol  Labs/ tests ordered today include:  Orders Placed This Encounter  Procedures  . EKG 12-Lead   Disposition:   FU with Zuri Bradway 6 months  Signed, Chris Braun Meredith Leeds, MD  06/18/2020 3:37 PM     [place] [address] [phone] (office) [phone] (fax)

## 2020-06-18 NOTE — Telephone Encounter (Signed)
Spoke with patient regarding results and recommendation.  Patient verbalizes understanding and is agreeable to plan of care. Advised patient to call back with any issues or concerns.  

## 2020-06-18 NOTE — Telephone Encounter (Signed)
-----   Message from Jenean Lindau, MD sent at 06/18/2020 11:31 AM EDT ----- The results of the study is unremarkable. Please inform patient. I will discuss in detail at next appointment. Cc  primary care/referring physician Jenean Lindau, MD 06/18/2020 11:31 AM

## 2020-06-28 DIAGNOSIS — H353132 Nonexudative age-related macular degeneration, bilateral, intermediate dry stage: Secondary | ICD-10-CM | POA: Diagnosis not present

## 2020-07-09 ENCOUNTER — Other Ambulatory Visit: Payer: Self-pay | Admitting: Neurology

## 2020-07-10 DIAGNOSIS — Z6826 Body mass index (BMI) 26.0-26.9, adult: Secondary | ICD-10-CM | POA: Diagnosis not present

## 2020-07-10 DIAGNOSIS — F0390 Unspecified dementia without behavioral disturbance: Secondary | ICD-10-CM | POA: Diagnosis not present

## 2020-07-10 DIAGNOSIS — I2510 Atherosclerotic heart disease of native coronary artery without angina pectoris: Secondary | ICD-10-CM | POA: Diagnosis not present

## 2020-07-10 DIAGNOSIS — I471 Supraventricular tachycardia: Secondary | ICD-10-CM | POA: Diagnosis not present

## 2020-07-10 DIAGNOSIS — G2581 Restless legs syndrome: Secondary | ICD-10-CM | POA: Diagnosis not present

## 2020-07-10 DIAGNOSIS — E785 Hyperlipidemia, unspecified: Secondary | ICD-10-CM | POA: Diagnosis not present

## 2020-07-10 DIAGNOSIS — H4710 Unspecified papilledema: Secondary | ICD-10-CM | POA: Diagnosis not present

## 2020-07-10 DIAGNOSIS — N400 Enlarged prostate without lower urinary tract symptoms: Secondary | ICD-10-CM | POA: Diagnosis not present

## 2020-07-10 DIAGNOSIS — D649 Anemia, unspecified: Secondary | ICD-10-CM | POA: Diagnosis not present

## 2020-07-10 DIAGNOSIS — I10 Essential (primary) hypertension: Secondary | ICD-10-CM | POA: Diagnosis not present

## 2020-07-10 DIAGNOSIS — M818 Other osteoporosis without current pathological fracture: Secondary | ICD-10-CM | POA: Diagnosis not present

## 2020-07-10 DIAGNOSIS — E538 Deficiency of other specified B group vitamins: Secondary | ICD-10-CM | POA: Diagnosis not present

## 2020-07-13 ENCOUNTER — Ambulatory Visit: Payer: PPO | Admitting: Cardiology

## 2020-07-13 ENCOUNTER — Encounter: Payer: Self-pay | Admitting: Cardiology

## 2020-07-13 ENCOUNTER — Other Ambulatory Visit: Payer: Self-pay

## 2020-07-13 VITALS — BP 100/60 | HR 52 | Ht 66.0 in | Wt 161.6 lb

## 2020-07-13 DIAGNOSIS — Z9889 Other specified postprocedural states: Secondary | ICD-10-CM | POA: Diagnosis not present

## 2020-07-13 DIAGNOSIS — I10 Essential (primary) hypertension: Secondary | ICD-10-CM | POA: Diagnosis not present

## 2020-07-13 DIAGNOSIS — E785 Hyperlipidemia, unspecified: Secondary | ICD-10-CM

## 2020-07-13 DIAGNOSIS — I2510 Atherosclerotic heart disease of native coronary artery without angina pectoris: Secondary | ICD-10-CM | POA: Diagnosis not present

## 2020-07-13 DIAGNOSIS — Z8679 Personal history of other diseases of the circulatory system: Secondary | ICD-10-CM | POA: Diagnosis not present

## 2020-07-13 NOTE — Patient Instructions (Signed)

## 2020-07-13 NOTE — Progress Notes (Signed)
Cardiology Office Note:    Date:  07/13/2020   ID:  Chris Braun, DOB [birth year], MRN [id number]  PCP:  Penelope Coop, FNP  Cardiologist:  Jenean Lindau, MD   Referring MD: Penelope Coop, FNP    ASSESSMENT:    1. Coronary artery disease involving native coronary artery of native heart without angina pectoris   2. Status post ablation of ventricular arrhythmia   3. Dyslipidemia   4. Primary hypertension    PLAN:    In order of problems listed above:  1. Coronary artery disease: Secondary prevention stressed to the patient.  Importance of compliance with diet medication stressed any vocalized understanding. 2. Paroxysmal atrial fibrillation:I discussed with the patient atrial fibrillation, disease process. Management and therapy including rate and rhythm control, anticoagulation benefits and potential risks were discussed extensively with the patient. Patient had multiple questions which were answered to patient's satisfaction. 3. Essential hypertension: Blood pressure stable and diet was emphasized. 4. Mixed dyslipidemia: On statin therapy and diet was emphasized 5. Patient will be seen in follow-up appointment in 6 months or earlier if the patient has any concerns    Medication Adjustments/Labs and Tests Ordered: Current medicines are reviewed at length with the patient today.  Concerns regarding medicines are outlined above.  No orders of the defined types were placed in this encounter.  No orders of the defined types were placed in this encounter.    No chief complaint on file.    History of Present Illness:    Chris Braun is a 85 y.o. male.  Patient has past medical history of paroxysmal atrial fibrillation.  He denies any problems at this time and takes care of activities of daily living.  He is essentially wheelchair-bound and very weak.  His wife is very supportive.  No chest pain orthopnea or PND.  At the time of my evaluation, the patient is alert awake  oriented and in no distress.  Past Medical History:  Diagnosis Date  . Acute hypoxemic respiratory failure ([place]) 05/27/2018  . AKI (acute kidney injury) ([place]) 05/27/2018  . Aspiration into airway   . Asthma   . Atrial tachycardia ([place]) 07/26/2018  . B12 deficiency   . CAD (coronary artery disease) 08/24/2014  . Carotid stenosis, bilateral 08/24/2014  . Cataracts, bilateral   . Compression fracture of L2 lumbar vertebra, closed, initial encounter ([place]) 10/27/2019  . COPD (chronic obstructive pulmonary disease) ([place])   . Depression   . Diverticulosis    per colonscopy  . Dyslipidemia 08/24/2014  . Falls frequently 06/03/2015  . Food impaction of esophagus   . Gait abnormality 01/19/2018  . GERD (gastroesophageal reflux disease)   . Hiatal hernia   . High cholesterol   . Hypertension   . Hypoxia   . Idiopathic progressive polyneuropathy   . Kidney stones   . Lumbar vertebral fracture, pathologic 10/27/2019  . Macular degeneration   . Memory disorder 01/19/2018  . Myocardial infarction ([place]) 2002  . 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 Osteoporosis   . Right kidney stone 05/27/2018  . Right upper lobe pneumonia 05/27/2018  . Risk for falls 05/28/2015  . RLS (restless legs syndrome) 04/05/2020  . Status post ablation of ventricular arrhythmia 04/27/2018  . Stroke (cerebrum) ([place]) 01/17/2014  . Stroke ([place])   . SVT (supraventricular tachycardia) ([place]) 12/09/2019  . Weakness of distal arms and legs 06/03/2015    Past Surgical History:  Procedure Laterality Date  . CARDIAC CATHETERIZATION  2002,   stents   . CATARACT EXTRACTION Left 2016  . Colonscopy  2012  . CYSTOSCOPY W/ URETERAL STENT PLACEMENT Right 05/30/2018   Procedure: CYSTOSCOPY WITH RETROGRADE PYELOGRAM/URETERAL STENT PLACEMENT;  Surgeon: Irine Seal, MD;  Location: [place];  Service: Urology;  Laterality: Right;  . ESOPHAGOGASTRODUODENOSCOPY (EGD) WITH PROPOFOL N/A 02/10/2020   Procedure: ESOPHAGOGASTRODUODENOSCOPY (EGD) WITH  PROPOFOL;  Surgeon: Yetta Flock, MD;  Location: [facility];  Service: Gastroenterology;  Laterality: N/A;  . FOREIGN BODY REMOVAL  02/10/2020   Procedure: FOREIGN BODY REMOVAL;  Surgeon: Yetta Flock, MD;  Location: [facility];  Service: Gastroenterology;;  . HIP FRACTURE SURGERY  2008   right  . HYDROCELE EXCISION  10/12/2014  . LITHOTRIPSY     2007, 2009, 2010, 2011, 2012, 2013  . LUMBAR LAMINECTOMY/DECOMPRESSION MICRODISCECTOMY  03/15/2012   Procedure: LUMBAR LAMINECTOMY/DECOMPRESSION MICRODISCECTOMY 1 LEVEL;  Surgeon: Elaina Hoops, MD;  Location: [facility];  Service: Neurosurgery;  Laterality: Left;  Left lumbar three-four decompressive lumbar laminectomy, discectomy  . MAXIMUM ACCESS (MAS)POSTERIOR LUMBAR INTERBODY FUSION (PLIF) 2 LEVEL N/A 12/08/2012   Procedure: FOR MAXIMUM ACCESS (MAS) POSTERIOR LUMBAR INTERBODY FUSION (PLIF) 2 LEVEL;  Surgeon: Elaina Hoops, MD;  Location: [place] [facility];  Service: Neurosurgery;  Laterality: N/A;  FOR MAXIMUM ACCESS (MAS) POSTERIOR LUMBAR INTERBODY FUSION (PLIF) 2 LEVEL  . ROTATOR CUFF REPAIR  1999   bil   . [place]  . TOTAL HIP ARTHROPLASTY Right 2008  . VT study     with Ablation    Current Medications: Current Meds  Medication Sig  . amiodarone (PACERONE) 200 MG tablet Take 100 mg by mouth daily.  . memantine (NAMENDA) 5 MG tablet Take 5 mg by mouth 2 (two) times daily.  . nitroGLYCERIN (NITROSTAT) 0.4 MG SL tablet Place 0.4 mg under the tongue every 5 (five) minutes as needed for chest pain.  Marland Kitchen rOPINIRole (REQUIP) 1 MG tablet Take 1 mg by mouth 3 (three) times daily.     Allergies:   Other, Pneumococcal polysaccharide vaccine, Hydrocodone-acetaminophen, Ibuprofen, and Oxycodone   Social History   Socioeconomic History  . Marital status: Married    Spouse name: Summer Parthasarathy  . Number of children: 4  . Years of education: 8  . Highest education level: Not on file  Occupational History   . Occupation: Retired  Tobacco Use  . Smoking status: Former Smoker    Packs/day: 1.00    Years: 45.00    Pack years: 45.00    Types: Cigarettes    Quit date: 02/02/2001    Years since quitting: 19.4  . Smokeless tobacco: Current User    Types: Chew  Vaping Use  . Vaping Use: Never used  Substance and Sexual Activity  . Alcohol use: No  . Drug use: No  . Sexual activity: Not on file  Other Topics Concern  . Not on file  Social History Narrative   Lives with wife   Caffeine use: coffee, tea   Social Determinants of Health   Financial Resource Strain: Not on file  Food Insecurity: Not on file  Transportation Needs: Not on file  Physical Activity: Not on file  Stress: Not on file  Social Connections: Not on file     Family History: The patient's family history includes Alzheimer's disease in his sister; Heart disease in his father, mother, sister, sister, and sister; Hemolytic uremic syndrome in his mother; Stroke in his father.  ROS:   Please see the history of present illness.    All other systems reviewed and are negative.  EKGs/Labs/Other Studies Reviewed:    The following studies were reviewed today: EKG was sinus rhythm bradycardia nonspecific ST-T changes   Recent Labs: 12/09/2019: ALT 19 02/11/2020: TSH 0.632 02/15/2020: Hemoglobin 13.2; Platelets 183 02/16/2020: Magnesium 1.5 06/08/2020: BUN 9; Creatinine, Ser 1.21; NT-Pro BNP 487; Potassium 3.5; Sodium 142  Recent Lipid Panel No results found for: CHOL, TRIG, HDL, CHOLHDL, VLDL, LDLCALC, LDLDIRECT  Physical Exam:    VS:  There were no vitals taken for this visit.    Wt Readings from Last 3 Encounters:  06/18/20 155 lb 3.2 oz (70.4 kg)  04/05/20 150 lb (68 kg)  02/29/20 155 lb (70.3 kg)     GEN: Patient is in no acute distress HEENT: Normal NECK: No JVD; No carotid bruits LYMPHATICS: No lymphadenopathy CARDIAC: Hear sounds regular, 2/6 systolic murmur at the apex. RESPIRATORY:  Clear to  auscultation without rales, wheezing or rhonchi  ABDOMEN: Soft, non-tender, non-distended MUSCULOSKELETAL:  No edema; No deformity  SKIN: Warm and dry NEUROLOGIC:  Alert and oriented x 3 PSYCHIATRIC:  Normal affect   Signed, Jenean Lindau, MD  07/13/2020 1:56 PM    [facility]

## 2020-07-22 ENCOUNTER — Telehealth: Payer: Self-pay | Admitting: Neurology

## 2020-07-22 NOTE — Telephone Encounter (Signed)
Blood work done by the primary care physician on 10 Jul 2020:  Glucose of 84, BUN of 11, creatinine of 1.07, estimated GFR of 69, sodium 142, potassium 3.9, chloride of 103, CO2 25, calcium 9.0, protein 6.3, albumin of 3.7, liver profile is unremarkable.  Total iron binding capacity of 223 which is slightly low, iron level of 66 which is normal, iron saturation of 30% which is normal.  White blood count 8.2 hemoglobin 13.9, hematocrit 40.5, MCV 89 platelets of 182, ferritin level of 120 which is normal.  Sedimentation rate of 11, C-reactive protein of 4.

## 2020-08-06 ENCOUNTER — Other Ambulatory Visit: Payer: Self-pay | Admitting: Cardiology

## 2020-08-06 NOTE — Telephone Encounter (Signed)
Refill sent to pharmacy.   

## 2020-08-16 ENCOUNTER — Observation Stay (HOSPITAL_COMMUNITY)
Admission: EM | Admit: 2020-08-16 | Discharge: 2020-08-17 | Disposition: A | Discharge status: Home / Self Care | Payer: PPO | Attending: Family Medicine | Admitting: Family Medicine

## 2020-08-16 ENCOUNTER — Other Ambulatory Visit: Payer: Self-pay

## 2020-08-16 ENCOUNTER — Emergency Department (HOSPITAL_COMMUNITY): Payer: PPO

## 2020-08-16 DIAGNOSIS — N1831 Chronic kidney disease, stage 3a: Secondary | ICD-10-CM | POA: Diagnosis not present

## 2020-08-16 DIAGNOSIS — I10 Essential (primary) hypertension: Secondary | ICD-10-CM | POA: Diagnosis present

## 2020-08-16 DIAGNOSIS — H4710 Unspecified papilledema: Secondary | ICD-10-CM

## 2020-08-16 DIAGNOSIS — N178 Other acute kidney failure: Secondary | ICD-10-CM | POA: Insufficient documentation

## 2020-08-16 DIAGNOSIS — Z7901 Long term (current) use of anticoagulants: Secondary | ICD-10-CM | POA: Insufficient documentation

## 2020-08-16 DIAGNOSIS — H4603 Optic papillitis, bilateral: Secondary | ICD-10-CM

## 2020-08-16 DIAGNOSIS — Z87891 Personal history of nicotine dependence: Secondary | ICD-10-CM | POA: Diagnosis not present

## 2020-08-16 DIAGNOSIS — Z79899 Other long term (current) drug therapy: Secondary | ICD-10-CM | POA: Diagnosis not present

## 2020-08-16 DIAGNOSIS — F0390 Unspecified dementia without behavioral disturbance: Secondary | ICD-10-CM | POA: Insufficient documentation

## 2020-08-16 DIAGNOSIS — I639 Cerebral infarction, unspecified: Principal | ICD-10-CM | POA: Diagnosis present

## 2020-08-16 DIAGNOSIS — J449 Chronic obstructive pulmonary disease, unspecified: Secondary | ICD-10-CM | POA: Diagnosis not present

## 2020-08-16 DIAGNOSIS — N289 Disorder of kidney and ureter, unspecified: Secondary | ICD-10-CM

## 2020-08-16 DIAGNOSIS — Z96641 Presence of right artificial hip joint: Secondary | ICD-10-CM | POA: Insufficient documentation

## 2020-08-16 DIAGNOSIS — Z7982 Long term (current) use of aspirin: Secondary | ICD-10-CM | POA: Diagnosis not present

## 2020-08-16 DIAGNOSIS — I480 Paroxysmal atrial fibrillation: Secondary | ICD-10-CM | POA: Diagnosis not present

## 2020-08-16 DIAGNOSIS — Z20822 Contact with and (suspected) exposure to covid-19: Secondary | ICD-10-CM | POA: Diagnosis not present

## 2020-08-16 DIAGNOSIS — I129 Hypertensive chronic kidney disease with stage 1 through stage 4 chronic kidney disease, or unspecified chronic kidney disease: Secondary | ICD-10-CM | POA: Insufficient documentation

## 2020-08-16 DIAGNOSIS — I2510 Atherosclerotic heart disease of native coronary artery without angina pectoris: Secondary | ICD-10-CM | POA: Diagnosis not present

## 2020-08-16 DIAGNOSIS — J45909 Unspecified asthma, uncomplicated: Secondary | ICD-10-CM | POA: Insufficient documentation

## 2020-08-16 DIAGNOSIS — R413 Other amnesia: Secondary | ICD-10-CM | POA: Diagnosis present

## 2020-08-16 DIAGNOSIS — G319 Degenerative disease of nervous system, unspecified: Secondary | ICD-10-CM | POA: Diagnosis not present

## 2020-08-16 LAB — COMPREHENSIVE METABOLIC PANEL
ALT: 19 U/L (ref 0–44)
AST: 30 U/L (ref 15–41)
Albumin: 3.3 g/dL — ABNORMAL LOW (ref 3.5–5.0)
Alkaline Phosphatase: 87 U/L (ref 38–126)
Anion gap: 11 (ref 5–15)
BUN: 10 mg/dL (ref 8–23)
CO2: 25 mmol/L (ref 22–32)
Calcium: 8.9 mg/dL (ref 8.9–10.3)
Chloride: 103 mmol/L (ref 98–111)
Creatinine, Ser: 1.42 mg/dL — ABNORMAL HIGH (ref 0.61–1.24)
GFR, Estimated: 49 mL/min — ABNORMAL LOW (ref >60)
Glucose, Bld: 148 mg/dL — ABNORMAL HIGH (ref 70–99)
Potassium: 4 mmol/L (ref 3.5–5.1)
Sodium: 139 mmol/L (ref 135–145)
Total Bilirubin: 0.7 mg/dL (ref 0.3–1.2)
Total Protein: 6.5 g/dL (ref 6.5–8.1)

## 2020-08-16 LAB — CBC WITH DIFFERENTIAL/PLATELET
Abs Immature Granulocytes: 0.07 10*3/uL (ref 0.00–0.07)
Basophils Absolute: 0.1 10*3/uL (ref 0.0–0.1)
Basophils Relative: 1 %
Eosinophils Absolute: 0.2 10*3/uL (ref 0.0–0.5)
Eosinophils Relative: 2 %
HCT: 42.4 % (ref 39.0–52.0)
Hemoglobin: 14.3 g/dL (ref 13.0–17.0)
Immature Granulocytes: 1 %
Lymphocytes Relative: 16 %
Lymphs Abs: 1.4 10*3/uL (ref 0.7–4.0)
MCH: 30.8 pg (ref 26.0–34.0)
MCHC: 33.7 g/dL (ref 30.0–36.0)
MCV: 91.2 fL (ref 80.0–100.0)
Monocytes Absolute: 0.7 10*3/uL (ref 0.1–1.0)
Monocytes Relative: 9 %
Neutro Abs: 6.3 10*3/uL (ref 1.7–7.7)
Neutrophils Relative %: 71 %
Platelets: 181 10*3/uL (ref 150–400)
RBC: 4.65 MIL/uL (ref 4.22–5.81)
RDW: 14 % (ref 11.5–15.5)
WBC: 8.7 10*3/uL (ref 4.0–10.5)
nRBC: 0 % (ref 0.0–0.2)

## 2020-08-16 LAB — C-REACTIVE PROTEIN: CRP: 1.2 mg/dL — ABNORMAL HIGH (ref <1.0)

## 2020-08-16 LAB — SEDIMENTATION RATE: Sed Rate: 12 mm/hr (ref 0–16)

## 2020-08-16 NOTE — ED Notes (Signed)
MRI called to verify pt can be transported.

## 2020-08-16 NOTE — ED Triage Notes (Signed)
Pt here for c/o worsening swelling on bilateral optic nerves, pain worse in L eye. Seen at opthalmic today, told to come straight to ED for further evaluation

## 2020-08-16 NOTE — ED Notes (Signed)
Patient transported to MRI 

## 2020-08-16 NOTE — Consult Note (Addendum)
Neurology Consultation Reason for Consult: Left eye pain and bilateral disc edema Requesting Physician: Theotis Burrow  CC: Bilateral papilledema, worse in the left eye than the right  History is obtained from: Wife by telephone, patient and ophthalmology notes at bedside  HPI: Chris Braun is a 85 y.o. 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  Per his wife, he has had longstanding issues with vision in the left eye for at least a year or so and has only had finger counting acuity in that timeframe.  They went for routine ophthalmological checkup about a month ago at which time they were told he had swelling of the left optic disc.  They were advised to follow-up with neurology and called Dr. Tobey Grim office but were unable to obtain an appointment; his neck scheduled regular appointment is in September.  They had close follow-up with ophthalmology on 6/30 at which time the optic nerve swelling was felt to be worse on the left with some mild involvement of the right eye as well.  Due to progression of his symptoms and lack of close neurological follow-up he was referred to the ED for more urgent evaluation.  Patient denies any acute complaints which is his baseline personality and consistent with his memory impairments from dementia.  Per his wife, for the past 1 week or 2 weeks he has been complaining of increased clumsiness using his walker.  She also feels he has had 3 weeks of dramatically worsened increased memory issues -- for example, he asked if he had any children (they have 4 children) and then asked if his sister was her  child, and could not remember what high school his daughter went to, repeatedly asking his daughter over and over again what high school she went to during a conversation they were having (previously memory lapses of this severity would happen occasionally but not daily). Hasn't been complaining of headaches or been sick in any other way, though he is typically a man a few complaints.  She reports that he has minimal snoring and never stops breathing during sleep  She is able to confirm that his last dose of Eliquis 9:30 AM on 6/30  He follows with Dr. Margette Fast who last saw him on 04/05/2020 at which time he noted that at baseline the patient is able to feed himself but requires assistance with bathing, dressing, keeping up with medications and appointments and is confused about family members at times as well as how to get to his bedroom, or how to use common objects such as use the TV remote and telephone and gas heater.  ROS: All other review of systems was negative except as noted in the HPI, though patient is an unreliable historian secondary to his dementia  Past Medical History:  Diagnosis Date   Acute hypoxemic respiratory failure ([place]) 05/27/2018   AKI (acute kidney injury) ([place]) 05/27/2018   Aspiration into airway    Asthma    Atrial tachycardia ([place]) 07/26/2018   B12 deficiency    CAD (coronary artery disease) 08/24/2014   Carotid stenosis, bilateral 08/24/2014   Cataracts, bilateral    Compression fracture of L2 lumbar vertebra, closed, initial encounter ([place]) 10/27/2019   COPD (chronic  obstructive pulmonary disease) ([place])    Depression    Diverticulosis    per colonscopy   Dyslipidemia 08/24/2014   Falls frequently 06/03/2015   Food impaction of esophagus    Gait abnormality 01/19/2018   GERD (gastroesophageal reflux disease)    Hiatal hernia    High cholesterol    Hypertension    Hypoxia    Idiopathic progressive polyneuropathy    Kidney stones    Lumbar vertebral fracture,  pathologic 10/27/2019   Macular degeneration    Memory disorder 01/19/2018   Myocardial infarction [facility]) 2002   Oculomotor nerve palsy, 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   Osteoporosis    Right kidney stone 05/27/2018   Right upper lobe pneumonia 05/27/2018   Risk for falls 05/28/2015   RLS (restless legs syndrome) 04/05/2020   Status post ablation of ventricular arrhythmia 04/27/2018   Stroke (cerebrum) ([place]) 01/17/2014   Stroke ([place])    SVT (supraventricular tachycardia) ([place]) 12/09/2019   Weakness of distal arms and legs 06/03/2015   Past Surgical History:  Procedure Laterality Date   CARDIAC CATHETERIZATION  2002,   stents    CATARACT EXTRACTION Left 2016   Colonscopy  2012   CYSTOSCOPY W/ URETERAL STENT PLACEMENT Right 05/30/2018   Procedure: CYSTOSCOPY WITH RETROGRADE PYELOGRAM/URETERAL STENT PLACEMENT;  Surgeon: Irine Seal, MD;  Location: [facility];  Service: Urology;  Laterality: Right;   ESOPHAGOGASTRODUODENOSCOPY (EGD) WITH PROPOFOL N/A 02/10/2020   Procedure: ESOPHAGOGASTRODUODENOSCOPY (EGD) WITH PROPOFOL;  Surgeon: Yetta Flock, MD;  Location: [facility];  Service: Gastroenterology;  Laterality: N/A;   FOREIGN BODY REMOVAL  02/10/2020   Procedure: FOREIGN BODY REMOVAL;  Surgeon: Yetta Flock, MD;  Location: [facility] ENDOSCOPY;  Service: Gastroenterology;;   HIP FRACTURE SURGERY  2008   right   HYDROCELE EXCISION  10/12/2014   LITHOTRIPSY     2007, 2009, 2010, 2011, 2012, 2013   LUMBAR LAMINECTOMY/DECOMPRESSION MICRODISCECTOMY  03/15/2012   Procedure: LUMBAR LAMINECTOMY/DECOMPRESSION MICRODISCECTOMY 1 LEVEL;  Surgeon: Elaina Hoops, MD;  Location: [facility] NEURO ORS;  Service: Neurosurgery;  Laterality: Left;  Left lumbar three-four decompressive lumbar laminectomy, discectomy   MAXIMUM ACCESS (MAS)POSTERIOR LUMBAR INTERBODY FUSION (PLIF) 2 LEVEL N/A 12/08/2012   Procedure: FOR MAXIMUM ACCESS (MAS) POSTERIOR LUMBAR INTERBODY FUSION (PLIF) 2 LEVEL;  Surgeon: Elaina Hoops, MD;  Location: [place] NEURO  ORS;  Service: Neurosurgery;  Laterality: N/A;  FOR MAXIMUM ACCESS (MAS) POSTERIOR LUMBAR INTERBODY FUSION (PLIF) 2 LEVEL   ROTATOR CUFF REPAIR  1999   bil    SALIVARY STONE REMOVAL  1964   1964   TOTAL HIP ARTHROPLASTY Right 2008   VT study     with Ablation     No current facility-administered medications for this encounter.  Current Outpatient Medications:    Acetaminophen 325 MG CAPS, Take 975 mg by mouth every 8 (eight) hours as needed for pain (pain)., Disp: , Rfl:    albuterol (PROVENTIL) (2.5 MG/3ML) 0.083% nebulizer solution, Take 2.5 mg by nebulization every 6 (six) hours as needed for wheezing or shortness of breath., Disp: , Rfl:    amiodarone (PACERONE) 200 MG tablet, Take 100 mg by mouth daily., Disp: , Rfl:    apixaban (ELIQUIS) 5 MG TABS tablet, Take 1 tablet (5 mg total) by mouth 2 (two) times daily., Disp: 180 tablet, Rfl: 3   aspirin EC 81 MG tablet, Take 81 mg by mouth at bedtime. , Disp: , Rfl:    atorvastatin (LIPITOR) 80 MG tablet, Take 40 mg by mouth at  bedtime., Disp: , Rfl:    calcitonin, salmon, (MIACALCIN/FORTICAL) 200 UNIT/ACT nasal spray, Place 1 spray into alternate nostrils at bedtime., Disp: , Rfl:    Calcium Carb-Cholecalciferol (CALCIUM-VITAMIN D3) 600-200 MG-UNIT TABS, Take 600 mg by mouth 2 (two) times daily., Disp: , Rfl:    Cholecalciferol (VITAMIN D) 50 MCG (2000 UT) CAPS, Take 2,000 Units by mouth daily., Disp: , Rfl:    digoxin (LANOXIN) 0.125 MG tablet, TAKE ONE TABLET BY MOUTH EVERY OTHER DAY, Disp: 30 tablet, Rfl: 3   donepezil (ARICEPT) 5 MG tablet, Take 5 mg by mouth at bedtime. , Disp: , Rfl:    Ferrous Gluconate (IRON 27 PO), Take 27 mg by mouth every evening., Disp: , Rfl:    furosemide (LASIX) 20 MG tablet, Take 20 mg by mouth daily., Disp: , Rfl:    ipratropium-albuterol (DUONEB) 0.5-2.5 (3) MG/3ML SOLN, Take 3 mLs by nebulization 2 (two) times daily. , Disp: , Rfl:    ketoconazole (NIZORAL) 2 % cream, Apply 1 application topically daily  as needed for irritation (on feet). Apply as needed to affected area.  , Disp: , Rfl:    lactulose (CHRONULAC) 10 GM/15ML solution, Take 10 g by mouth at bedtime as needed for mild constipation. , Disp: , Rfl:    loperamide (IMODIUM) 2 MG capsule, Take 2-4 mg by mouth as needed for diarrhea or loose stools., Disp: , Rfl:    loratadine (CLARITIN) 10 MG tablet, Take 10 mg by mouth in the morning. , Disp: , Rfl:    Lutein 20 MG TABS, Take 20 mg by mouth daily., Disp: , Rfl:    memantine (NAMENDA) 5 MG tablet, Take 5 mg by mouth 2 (two) times daily., Disp: , Rfl:    methocarbamol (ROBAXIN) 500 MG tablet, Take 500 mg by mouth 3 (three) times daily as needed for pain., Disp: , Rfl:    montelukast (SINGULAIR) 10 MG tablet, Take 10 mg by mouth daily. , Disp: , Rfl:    Multiple Vitamins-Minerals (MULTIVITAMIN PO), Take 1 tablet by mouth every evening. , Disp: , Rfl:    nitroGLYCERIN (NITROSTAT) 0.4 MG SL tablet, Place 0.4 mg under the tongue every 5 (five) minutes as needed for chest pain., Disp: , Rfl:    ondansetron (ZOFRAN-ODT) 4 MG disintegrating tablet, Take 4 mg by mouth every 8 (eight) hours as needed for nausea or vomiting. , Disp: , Rfl:    pantoprazole (PROTONIX) 40 MG tablet, Take 1 tablet (40 mg total) by mouth 2 (two) times daily., Disp: 60 tablet, Rfl: 0   potassium chloride (KLOR-CON) 10 MEQ tablet, Take 10 mEq by mouth as directed. When taking Lasix, Disp: , Rfl:    rOPINIRole (REQUIP) 1 MG tablet, Take 1 mg by mouth 3 (three) times daily., Disp: , Rfl:    sucralfate (CARAFATE) 1 GM/10ML suspension, Take 10 mLs (1 g total) by mouth 4 (four) times daily -  with meals and at bedtime., Disp: 420 mL, Rfl: 0   SYMBICORT 160-4.5 MCG/ACT inhaler, Inhale 2 puffs into the lungs 2 (two) times daily., Disp: , Rfl: 12   tamsulosin (FLOMAX) 0.4 MG CAPS capsule, Take 0.4 mg by mouth daily after supper., Disp: , Rfl:    vitamin B-12 (CYANOCOBALAMIN) 1000 MCG tablet, Take 2,000 mcg by mouth daily., Disp: ,  Rfl:    Family History  Problem Relation Age of Onset   Hemolytic uremic syndrome Mother    Heart disease Mother    Stroke Father    Heart  disease Father    Heart disease Sister    Heart disease Sister    Heart disease Sister    Alzheimer's disease Sister    Social History:  reports that he quit smoking about 19 years ago. His smoking use included cigarettes. He has a 45.00 pack-year smoking history. His smokeless tobacco use includes chew. He reports that he does not drink alcohol and does not use drugs.   Exam: Current vital signs: BP (!) 145/77 (BP Location: Right Arm)   Pulse 65   Temp 98.1 F (36.7 C) (Oral)   Resp (!) 22   SpO2 94%  Vital signs in last 24 hours: Temp:  [98.1 F (36.7 C)] 98.1 F (36.7 C) (06/30 1927) Pulse Rate:  [65-83] 65 (06/30 1927) Resp:  [18-22] 22 (06/30 1927) BP: (122-145)/(70-77) 145/77 (06/30 1927) SpO2:  [94 %-96 %] 94 % (06/30 1927)   Physical Exam  Constitutional: Appears well-developed and well-nourished.  Psych: Affect appropriate to situation, calm and cooperative Eyes: No scleral injection HENT: No oropharyngeal obstruction.  Edentulous MSK: no significant joint deformities.  Cardiovascular: Perfusing extremities well Respiratory: Effort normal, non-labored breathing GI: Soft.  No distension. There is no tenderness.  Skin: Warm dry and intact visible skin  Neuro: Mental Status: Patient is awake, alert, oriented to person, month, but slightly confused about situation (does initially answer that the month is March but then noticed that July 4 is coming up as the next holiday, does not remember that he is at a hospital) Patient is able to give limited history  No signs of aphasia or neglect Cranial Nerves: II: Visual Fields are full to finger counting bilaterally. Pupils are notable for mild anisocoria with left pupil slightly larger than the right pupil.  While both constrict with light application, there is a left if apparent  pupillary defect.  On funduscopic examination there is very mild blurring of the right optic disc, nasally (grade 1), but noticeable blurring of the left optic disc (grade 2 to 3). III,IV, VI: EOMI without ptosis or diploplia.  V: Facial sensation is symmetric to temperature VII: Facial movement is symmetric.  VIII: hearing is intact to voice X: Uvula elevates symmetrically XI: Shoulder shrug is symmetric. XII: tongue is midline without atrophy or fasciculations.  Motor: Tone is normal. Bulk is normal. 5/5 strength was present in all four extremities.  Sensory: Sensation is symmetric to light touch and temperature in the arms and legs. Deep Tendon Reflexes: 2+ and symmetric in the biceps and patellae.  Cerebellar: FNF and HKS are intact bilaterally   I have reviewed labs in epic and the results pertinent to this consultation are: CBC and CMP are reassuring and notable only for mild AKI (creatinine 1.42 with a baseline of 1-1.2; GFR 49).  Additionally mild hyperglycemia with glucose of 148, low albumin at 3.3 CRP is mildly elevated at 1.2 (reference range less than 1), ESR is reassuring at 12 (normal)   I have reviewed the images obtained: MRI brain and orbits with and without contrast which on my initial read did not show any acute CNS process, though there is an incidental finding of punctate left cerebellar stroke  Impression: This is an 85 year old presenting with worsening papilledema of unclear etiology.  Lab work at this time is reassuring but unrevealing, and MRI brain and orbits is similarly reassuring but unrevealing.  Suspect that the incidental cerebellar stroke is in the setting of holding anticoagulation with known atrial fibrillation; patient is already medically optimized from  a stroke risk perspective and do not think there is benefit in repeating full stroke work-up at this time.  Differential diagnosis includes diabetic papillopathy (a diagnosis of exclusion),  obstructive sleep apnea, optic neuritis, thyroid ophthalmopathy, infection/inflammation not detected on MRI, intracranial hypertension (unlikely given the patient's demographic, but would be potentially treatable)  Recommendations: #Bilateral papilledema, left worse than right -TSH -Hold Eiliquis for LP, may be restarted 8 hrs post procedure as long as patient is clinically stable -Lumbar puncture can safely be performed 24-48 hours after last Eliquis dose (based on age and borderline renal function, in discussion with pharmacy), will need to be under fluoroscopy guidance given patient's significant lumbar hardware.  Recommend studies including opening pressure obtained in the lateral decubitus position, cell counts and tubes 1 and 4, protein, glucose, fungal culture, HSV 1/2  - Anti-Xa level to confirm safety of proceeding today, if elevated may need to wait until tomorrow -Amiodarone is associated with IIH, if opening pressure is increased will need to discuss alternative with cardiology -Outpatient sleep study  -Neurology will follow  [facility] [phone] Available 7 PM to 7 AM, outside of these hours please call Neurologist on call as listed on Am[facility].

## 2020-08-16 NOTE — ED Provider Notes (Signed)
Emergency Medicine Provider Triage Evaluation Note  Chris Braun , a 85 y.o. male  was evaluated in triage.  Pt complains of optic nerve edema .  Patient was seen by his ophthalmologist earlier today in the emergency department for further evaluation.  Patient denies vision loss, eye pain, eye discharge, fevers, chills, neck pain, neck stiffness, facial asymmetry, slurred speech.   Review of Systems  Positive: Bilateral optic nerve edema Negative: vision loss, eye pain, eye discharge, fevers, chills, neck pain, neck stiffness, facial asymmetry, slurred speech  Physical Exam  BP 122/70 (BP Location: Left Arm)   Pulse 83   Temp 98.1 F (36.7 C) (Oral)   Resp 18   SpO2 96%  Gen:   Awake, no distress   Resp:  Normal effort  MSK:   Moves extremities without difficulty  Other:   Intact bilaterally, pupils PERRL, CN II to XII intact  Medical Decision Making  Medically screening exam initiated at 6:12 PM.  Appropriate orders placed.  JAVARIAN JAKUBIAK was informed that the remainder of the evaluation will be completed by another provider, this initial triage assessment does not replace that evaluation, and the importance of remaining in the ED until their evaluation is complete.  The patient appears stable so that the remainder of the work up may be completed by another provider.      Loni Beckwith, PA-C 08/16/20 [place], [place], DO 08/16/20 [id number]

## 2020-08-16 NOTE — ED Provider Notes (Signed)
[facility] EMERGENCY DEPARTMENT Provider Note   CSN: [id number] Arrival date & time: 08/16/20  1737     History Chief Complaint  Patient presents with   Eye Pain    Chris Braun is a 85 y.o. male.  Patient with history of macular degeneration, very poor vision in left eye, anticoagulated on Eliquis, stroke, MI, dementia -- presents to the emergency department after being seen by ophthalmology today.  Patient was at [facility] seen by Dr. Ernestine Mcmurray for follow-up.  Patient was noted to have optic nerve edema during a visit in May.  He was unable to get an appointment with his neurologist in the meantime.  When he went back for follow-up today, they noted worsening optic disc edema, left greater than right.  He was referred to the emergency department for further evaluation.  They recommended lab work including CBC, CRP, ESR and imaging with MRI or CT.  Currently patient does not have any new complaints, however wife does note that sometimes he does not voice symptoms due to dementia.  His visual acuity in left eye is that he can see light and fingers being held up but patient is not sharp.  Patient currently denies any headache or pain.  Patient and wife deny signs of stroke including: facial droop, slurred speech, aphasia, weakness/numbness in extremities, imbalance/trouble walking.       Past Medical History:  Diagnosis Date   Acute hypoxemic respiratory failure ([place]) 05/27/2018   AKI (acute kidney injury) ([place]) 05/27/2018   Aspiration into airway    Asthma    Atrial tachycardia ([place]) 07/26/2018   B12 deficiency    CAD (coronary artery disease) 08/24/2014   Carotid stenosis, bilateral 08/24/2014   Cataracts, bilateral    Compression fracture of L2 lumbar vertebra, closed, initial encounter ([place]) 10/27/2019   COPD (chronic obstructive pulmonary disease) ([place])    Depression    Diverticulosis    per colonscopy   Dyslipidemia 08/24/2014   Falls frequently  06/03/2015   Food impaction of esophagus    Gait abnormality 01/19/2018   GERD (gastroesophageal reflux disease)    Hiatal hernia    High cholesterol    Hypertension    Hypoxia    Idiopathic progressive polyneuropathy    Kidney stones    Lumbar vertebral fracture, pathologic 10/27/2019   Macular degeneration    Memory disorder 01/19/2018   Myocardial infarction [facility]) 2002   Oculomotor nerve palsy, 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   Osteoporosis    Right kidney stone 05/27/2018   Right upper lobe pneumonia 05/27/2018   Risk for falls 05/28/2015   RLS (restless legs syndrome) 04/05/2020   Status post ablation of ventricular arrhythmia 04/27/2018   Stroke (cerebrum) ([place]) 01/17/2014   Stroke ([place])    SVT (supraventricular tachycardia) ([place]) 12/09/2019   Weakness of distal arms and legs 06/03/2015    Patient Active Problem List   Diagnosis Date Noted   RLS (restless legs syndrome) 04/05/2020   Aspiration into airway    Food impaction of esophagus    SVT (supraventricular tachycardia) ([place]) 12/09/2019   Cataracts, bilateral    Depression    Kidney stones    Lumbar vertebral fracture, pathologic 10/27/2019   Compression fracture of L2 lumbar vertebra, closed, initial encounter ([place]) 10/27/2019   Hypoxia    Atrial tachycardia ([place]) 07/26/2018   Stroke ([place])    Osteoporosis    Macular degeneration    Idiopathic progressive polyneuropathy    Hypertension    High  cholesterol    Hiatal hernia    GERD (gastroesophageal reflux disease)    Asthma    COPD (chronic obstructive pulmonary disease) ([place])    Diverticulosis    B12 deficiency    Acute hypoxemic respiratory failure ([place]) 05/27/2018   AKI (acute kidney injury) ([place]) 05/27/2018   Right kidney stone 05/27/2018   Right upper lobe pneumonia 05/27/2018   Status post ablation of ventricular arrhythmia 04/27/2018   Memory disorder 01/19/2018   Gait abnormality 01/19/2018   Oculomotor nerve palsy, 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   Falls frequently 06/03/2015    Weakness of distal arms and legs 06/03/2015   Risk for falls 05/28/2015   CAD (coronary artery disease) 08/24/2014   Carotid stenosis, bilateral 08/24/2014   Dyslipidemia 08/24/2014   Stroke (cerebrum) ([place]) 01/17/2014   Myocardial infarction ([place]) 2002    Past Surgical History:  Procedure Laterality Date   CARDIAC CATHETERIZATION  2002,   stents    CATARACT EXTRACTION Left 2016   Colonscopy  2012   CYSTOSCOPY W/ URETERAL STENT PLACEMENT Right 05/30/2018   Procedure: CYSTOSCOPY WITH RETROGRADE PYELOGRAM/URETERAL STENT PLACEMENT;  Surgeon: Irine Seal, MD;  Location: [place];  Service: Urology;  Laterality: Right;   ESOPHAGOGASTRODUODENOSCOPY (EGD) WITH PROPOFOL N/A 02/10/2020   Procedure: ESOPHAGOGASTRODUODENOSCOPY (EGD) WITH PROPOFOL;  Surgeon: Yetta Flock, MD;  Location: [place];  Service: Gastroenterology;  Laterality: N/A;   FOREIGN BODY REMOVAL  02/10/2020   Procedure: FOREIGN BODY REMOVAL;  Surgeon: Yetta Flock, MD;  Location: [facility]COPY;  Service: Gastroenterology;;   HIP FRACTURE SURGERY  2008   right   HYDROCELE EXCISION  10/12/2014   LITHOTRIPSY     2007, 2009, 2010, 2011, 2012, 2013   LUMBAR LAMINECTOMY/DECOMPRESSION MICRODISCECTOMY  03/15/2012   Procedure: LUMBAR LAMINECTOMY/DECOMPRESSION MICRODISCECTOMY 1 LEVEL;  Surgeon: Elaina Hoops, MD;  Location: [place] [facility];  Service: Neurosurgery;  Laterality: Left;  Left lumbar three-four decompressive lumbar laminectomy, discectomy   MAXIMUM ACCESS (MAS)POSTERIOR LUMBAR INTERBODY FUSION (PLIF) 2 LEVEL N/A 12/08/2012   Procedure: FOR MAXIMUM ACCESS (MAS) POSTERIOR LUMBAR INTERBODY FUSION (PLIF) 2 LEVEL;  Surgeon: Elaina Hoops, MD;  Location: [place] [facility]URO ORS;  Service: Neurosurgery;  Laterality: N/A;  FOR MAXIMUM ACCESS (MAS) POSTERIOR LUMBAR INTERBODY FUSION (PLIF) 2 LEVEL   ROTATOR CUFF REPAIR  1999   bil    SALIVARY STONE REMOVAL  1964   1964   TOTAL HIP ARTHROPLASTY Right 2008   VT study     with Ablation        Family History  Problem Relation Age of Onset   Hemolytic uremic syndrome Mother    Heart disease Mother    Stroke Father    Heart disease Father    Heart disease Sister    Heart disease Sister    Heart disease Sister    Alzheimer's disease Sister     Social History   Tobacco Use   Smoking status: Former    Packs/day: 1.00    Years: 45.00    Pack years: 45.00    Types: Cigarettes    Quit date: 02/02/2001    Years since quitting: 19.5   Smokeless tobacco: Current    Types: Chew  Vaping Use   Vaping Use: Never used  Substance Use Topics   Alcohol use: No   Drug use: No    Home Medications Prior to Admission medications   Medication Sig Start Date End Date Taking? Authorizing Provider  Acetaminophen 325 MG CAPS Take 975 mg by mouth  every 8 (eight) hours as needed for pain (pain).    [provider]  albuterol (PROVENTIL) (2.5 MG/3ML) 0.083% nebulizer solution Take 2.5 mg by nebulization every 6 (six) hours as needed for wheezing or shortness of breath. 01/13/20   [provider]  amiodarone (PACERONE) 200 MG tablet Take 100 mg by mouth daily.    [provider]  apixaban (ELIQUIS) 5 MG TABS tablet Take 1 tablet (5 mg total) by mouth 2 (two) times daily. 06/08/20   Revankar, Reita Cliche, MD  aspirin EC 81 MG tablet Take 81 mg by mouth at bedtime.     [provider]  atorvastatin (LIPITOR) 80 MG tablet Take 40 mg by mouth at bedtime.    [provider]  calcitonin, salmon, (MIACALCIN/FORTICAL) 200 UNIT/ACT nasal spray Place 1 spray into alternate nostrils at bedtime.    [provider]  Calcium Carb-Cholecalciferol (CALCIUM-VITAMIN D3) 600-200 MG-UNIT TABS Take 600 mg by mouth 2 (two) times daily.    [provider]  Cholecalciferol (VITAMIN D) 50 MCG (2000 UT) CAPS Take 2,000 Units by mouth daily.    [provider]  digoxin (LANOXIN) 0.125 MG tablet TAKE ONE TABLET BY MOUTH EVERY OTHER DAY 08/06/20    Revankar, Reita Cliche, MD  donepezil (ARICEPT) 5 MG tablet Take 5 mg by mouth at bedtime.  11/05/15   [provider]  Ferrous Gluconate (IRON 27 PO) Take 27 mg by mouth every evening.    [provider]  furosemide (LASIX) 20 MG tablet Take 20 mg by mouth daily.    [provider]  ipratropium-albuterol (DUONEB) 0.5-2.5 (3) MG/3ML SOLN Take 3 mLs by nebulization 2 (two) times daily.     [provider]  ketoconazole (NIZORAL) 2 % cream Apply 1 application topically daily as needed for irritation (on feet). Apply as needed to affected area.   04/25/19   [provider]  lactulose (CHRONULAC) 10 GM/15ML solution Take 10 g by mouth at bedtime as needed for mild constipation.     [provider]  loperamide (IMODIUM) 2 MG capsule Take 2-4 mg by mouth as needed for diarrhea or loose stools.    [provider]  loratadine (CLARITIN) 10 MG tablet Take 10 mg by mouth in the morning.     [provider]  Lutein 20 MG TABS Take 20 mg by mouth daily.    [provider]  memantine (NAMENDA) 5 MG tablet Take 5 mg by mouth 2 (two) times daily.    [provider]  methocarbamol (ROBAXIN) 500 MG tablet Take 500 mg by mouth 3 (three) times daily as needed for pain. 11/23/19   [provider]  montelukast (SINGULAIR) 10 MG tablet Take 10 mg by mouth daily.     [provider]  Multiple Vitamins-Minerals (MULTIVITAMIN PO) Take 1 tablet by mouth every evening.     [provider]  nitroGLYCERIN (NITROSTAT) 0.4 MG SL tablet Place 0.4 mg under the tongue every 5 (five) minutes as needed for chest pain.    [provider]  ondansetron (ZOFRAN-ODT) 4 MG disintegrating tablet Take 4 mg by mouth every 8 (eight) hours as needed for nausea or vomiting.  12/07/19   [provider]  pantoprazole (PROTONIX) 40 MG tablet Take 1 tablet (40 mg total) by mouth 2 (two) times daily. 02/16/20   Swayze, Ava, DO   potassium chloride (KLOR-CON) 10 MEQ tablet Take 10 mEq by mouth as directed. When taking Lasix  [provider]  rOPINIRole (REQUIP) 1 MG tablet Take 1 mg by mouth 3 (three) times daily.    [provider]  sucralfate (CARAFATE) 1 GM/10ML suspension Take 10 mLs (1 g total) by mouth 4 (four) times daily -  with meals and at bedtime. 02/16/20   Swayze, Ava, DO  SYMBICORT 160-4.5 MCG/ACT inhaler Inhale 2 puffs into the lungs 2 (two) times daily. 07/21/16   [provider]  tamsulosin (FLOMAX) 0.4 MG CAPS capsule Take 0.4 mg by mouth daily after supper.    [provider]  vitamin B-12 (CYANOCOBALAMIN) 1000 MCG tablet Take 2,000 mcg by mouth daily.    [provider]    Allergies    Other, Pneumococcal polysaccharide vaccine, Hydrocodone-acetaminophen, Ibuprofen, and Oxycodone  Review of Systems   Review of Systems  Constitutional:  Negative for fever.  HENT:  Negative for rhinorrhea and sore throat.   Eyes:  Positive for visual disturbance. Negative for photophobia and redness.  Respiratory:  Negative for cough.   Cardiovascular:  Negative for chest pain.  Gastrointestinal:  Negative for abdominal pain, diarrhea, nausea and vomiting.  Genitourinary:  Negative for dysuria and hematuria.  Musculoskeletal:  Negative for myalgias.  Skin:  Negative for rash.  Neurological:  Negative for speech difficulty, weakness and headaches.   Physical Exam Updated Vital Signs BP (!) 145/77 (BP Location: Right Arm)   Pulse 65   Temp 98.1 F (36.7 C) (Oral)   Resp (!) 22   SpO2 94%   Physical Exam Vitals and nursing note reviewed.  Constitutional:      General: He is not in acute distress.    Appearance: He is well-developed.  HENT:     Head: Normocephalic and atraumatic.     Right Ear: External ear normal.     Left Ear: External ear normal.     Nose: Nose normal.     Mouth/Throat:     Pharynx: Uvula midline.  Eyes:     General: Lids are normal.         Right eye: No discharge.        Left eye: No discharge.     Conjunctiva/sclera: Conjunctivae normal.     Pupils:     Right eye: Pupil is round.     Left eye: Pupil is not reactive. Pupil is round.     Comments: Patient can tell many fingers I have holding up at about a 1 foot distance.  Cardiovascular:     Rate and Rhythm: Normal rate and regular rhythm.     Heart sounds: Normal heart sounds.  Pulmonary:     Effort: Pulmonary effort is normal.     Breath sounds: Normal breath sounds.  Abdominal:     Palpations: Abdomen is soft.     Tenderness: There is no abdominal tenderness.  Musculoskeletal:        General: Normal range of motion.     Cervical back: Normal range of motion and neck supple. No tenderness or bony tenderness.  Skin:    General: Skin is warm and dry.  Neurological:     Mental Status: He is alert and oriented to person, place, and time.     GCS: GCS eye subscore is 4. GCS verbal subscore is 5. GCS motor subscore is 6.     Cranial Nerves: No cranial nerve deficit.     Sensory: No sensory deficit.     Motor: No abnormal muscle tone.     Deep Tendon Reflexes:  Reflexes are normal and symmetric.    ED Results / Procedures / Treatments   Labs (all labs ordered are listed, but only abnormal results are displayed) Labs Reviewed  COMPREHENSIVE METABOLIC PANEL - Abnormal; Notable for the following components:      Result Value   Glucose, Bld 148 (*)    Creatinine, Ser 1.42 (*)    Albumin 3.3 (*)    GFR, Estimated 49 (*)    All other components within normal limits  C-REACTIVE PROTEIN - Abnormal; Notable for the following components:   CRP 1.2 (*)    All other components within normal limits  SEDIMENTATION RATE  CBC WITH DIFFERENTIAL/PLATELET    EKG None  Radiology No results found.  Procedures Procedures   Medications Ordered in ED Medications - No data to display  ED Course  I have reviewed the triage vital signs and the nursing  notes.  Pertinent labs & imaging results that were available during my care of the patient were reviewed by me and considered in my medical decision making (see chart for details).  Patient seen and examined.  Reviewed records sent from [facility].  Work-up ordered in triage reviewed, added CRP.  Discussed with Dr. Rex Kras.  Vital signs reviewed and are as follows: BP 124/74   Pulse 61   Temp 98.1 F (36.7 C) (Oral)   Resp 18   SpO2 92%   Spoke with neurology for imaging reccs. Dr. Curly Sh[place] to see in consultation.  MRI brain and orbits with and without contrast ordered.  11:53 PM Signout to [employer] at shift change.     MDM Rules/Calculators/A&P                          Pending completion of work-up for worsening optic nerve edema.   Final Clinical Impression(s) / ED Diagnoses Final diagnoses:  None    Rx / DC Orders ED Discharge Orders     None        Carlisle Cater, PA-C 08/16/20 2355    Little, Wenda Overland, MD 08/17/20 0010

## 2020-08-17 ENCOUNTER — Observation Stay (HOSPITAL_COMMUNITY): Payer: PPO

## 2020-08-17 ENCOUNTER — Encounter (HOSPITAL_COMMUNITY): Payer: Self-pay | Admitting: Family Medicine

## 2020-08-17 DIAGNOSIS — G319 Degenerative disease of nervous system, unspecified: Secondary | ICD-10-CM | POA: Diagnosis not present

## 2020-08-17 DIAGNOSIS — I10 Essential (primary) hypertension: Secondary | ICD-10-CM

## 2020-08-17 DIAGNOSIS — H469 Unspecified optic neuritis: Secondary | ICD-10-CM | POA: Insufficient documentation

## 2020-08-17 DIAGNOSIS — I639 Cerebral infarction, unspecified: Secondary | ICD-10-CM

## 2020-08-17 DIAGNOSIS — J449 Chronic obstructive pulmonary disease, unspecified: Secondary | ICD-10-CM

## 2020-08-17 DIAGNOSIS — H4710 Unspecified papilledema: Secondary | ICD-10-CM | POA: Diagnosis not present

## 2020-08-17 DIAGNOSIS — R413 Other amnesia: Secondary | ICD-10-CM

## 2020-08-17 DIAGNOSIS — N289 Disorder of kidney and ureter, unspecified: Secondary | ICD-10-CM

## 2020-08-17 DIAGNOSIS — I480 Paroxysmal atrial fibrillation: Secondary | ICD-10-CM

## 2020-08-17 HISTORY — DX: Cerebral infarction, unspecified: I63.9

## 2020-08-17 HISTORY — DX: Paroxysmal atrial fibrillation: I48.0

## 2020-08-17 LAB — LIPID PANEL
Cholesterol: 177 mg/dL (ref 0–200)
HDL: 44 mg/dL (ref >40)
LDL Cholesterol: 104 mg/dL — ABNORMAL HIGH (ref 0–99)
Total CHOL/HDL Ratio: 4 RATIO
Triglycerides: 145 mg/dL (ref <150)
VLDL: 29 mg/dL (ref 0–40)

## 2020-08-17 LAB — CBC
HCT: 44 % (ref 39.0–52.0)
Hemoglobin: 15.2 g/dL (ref 13.0–17.0)
MCH: 31 pg (ref 26.0–34.0)
MCHC: 34.5 g/dL (ref 30.0–36.0)
MCV: 89.8 fL (ref 80.0–100.0)
Platelets: 181 10*3/uL (ref 150–400)
RBC: 4.9 MIL/uL (ref 4.22–5.81)
RDW: 13.6 % (ref 11.5–15.5)
WBC: 9.9 10*3/uL (ref 4.0–10.5)
nRBC: 0 % (ref 0.0–0.2)

## 2020-08-17 LAB — BASIC METABOLIC PANEL
Anion gap: 9 (ref 5–15)
BUN: 10 mg/dL (ref 8–23)
CO2: 27 mmol/L (ref 22–32)
Calcium: 8.7 mg/dL — ABNORMAL LOW (ref 8.9–10.3)
Chloride: 96 mmol/L — ABNORMAL LOW (ref 98–111)
Creatinine, Ser: 1.24 mg/dL (ref 0.61–1.24)
GFR, Estimated: 58 mL/min — ABNORMAL LOW (ref >60)
Glucose, Bld: 85 mg/dL (ref 70–99)
Potassium: 4.3 mmol/L (ref 3.5–5.1)
Sodium: 132 mmol/L — ABNORMAL LOW (ref 135–145)

## 2020-08-17 LAB — HEPARIN LEVEL (UNFRACTIONATED): Heparin Unfractionated: >1.1 IU/mL — ABNORMAL HIGH (ref 0.30–0.70)

## 2020-08-17 LAB — SARS CORONAVIRUS 2 (TAT 6-24 HRS): SARS Coronavirus 2: NEGATIVE

## 2020-08-17 LAB — TSH: TSH: 4.192 u[IU]/mL (ref 0.350–4.500)

## 2020-08-17 MED ORDER — TAMSULOSIN HCL 0.4 MG PO CAPS: 0.4000 mg | ORAL_CAPSULE | Freq: Every day | ORAL | Status: DC

## 2020-08-17 MED ORDER — ASPIRIN EC 81 MG PO TBEC: 81.0000 mg | DELAYED_RELEASE_TABLET | Freq: Every day | ORAL | Status: DC

## 2020-08-17 MED ORDER — PANTOPRAZOLE SODIUM 40 MG PO TBEC
40.0000 mg | DELAYED_RELEASE_TABLET | Freq: Two times a day (BID) | ORAL | Status: DC
  Administered 2020-08-17: 40 mg via ORAL
  Filled 2020-08-17: qty 1

## 2020-08-17 MED ORDER — AMIODARONE HCL 200 MG PO TABS
100.0000 mg | ORAL_TABLET | Freq: Every day | ORAL | Status: DC
  Administered 2020-08-17: 100 mg via ORAL
  Filled 2020-08-17: qty 1

## 2020-08-17 MED ORDER — STROKE: EARLY STAGES OF RECOVERY BOOK: Freq: Once | Status: AC

## 2020-08-17 MED ORDER — SENNOSIDES-DOCUSATE SODIUM 8.6-50 MG PO TABS: 1.0000 | ORAL_TABLET | Freq: Every evening | ORAL | Status: DC | PRN

## 2020-08-17 MED ORDER — MEMANTINE HCL 5 MG PO TABS
5.0000 mg | ORAL_TABLET | Freq: Two times a day (BID) | ORAL | Status: DC
  Administered 2020-08-17: 5 mg via ORAL
  Filled 2020-08-17 (×2): qty 1

## 2020-08-17 MED ORDER — ACETAMINOPHEN 650 MG RE SUPP: 650.0000 mg | RECTAL | Status: DC | PRN

## 2020-08-17 MED ORDER — ENOXAPARIN SODIUM 30 MG/0.3ML IJ SOSY
30.0000 mg | PREFILLED_SYRINGE | INTRAMUSCULAR | Status: DC
  Administered 2020-08-17: 30 mg via SUBCUTANEOUS
  Filled 2020-08-17: qty 0.3

## 2020-08-17 MED ORDER — ATORVASTATIN CALCIUM 40 MG PO TABS: 40.0000 mg | ORAL_TABLET | Freq: Every day | ORAL | Status: DC

## 2020-08-17 MED ORDER — ALBUTEROL SULFATE (2.5 MG/3ML) 0.083% IN NEBU: 2.5000 mg | INHALATION_SOLUTION | Freq: Four times a day (QID) | RESPIRATORY_TRACT | Status: DC | PRN

## 2020-08-17 MED ORDER — APIXABAN 5 MG PO TABS: 5.0000 mg | ORAL_TABLET | Freq: Two times a day (BID) | ORAL | Status: DC

## 2020-08-17 MED ORDER — ACETAMINOPHEN 160 MG/5ML PO SOLN: 650.0000 mg | ORAL | Status: DC | PRN

## 2020-08-17 MED ORDER — MOMETASONE FURO-FORMOTEROL FUM 200-5 MCG/ACT IN AERO
2.0000 | INHALATION_SPRAY | Freq: Two times a day (BID) | RESPIRATORY_TRACT | Status: DC
  Filled 2020-08-17: qty 8.8

## 2020-08-17 MED ORDER — DONEPEZIL HCL 5 MG PO TABS
5.0000 mg | ORAL_TABLET | Freq: Every day | ORAL | Status: DC
  Filled 2020-08-17: qty 1

## 2020-08-17 MED ORDER — DIGOXIN 125 MCG PO TABS
0.1250 mg | ORAL_TABLET | ORAL | Status: DC
  Administered 2020-08-17: 0.125 mg via ORAL
  Filled 2020-08-17: qty 1

## 2020-08-17 MED ORDER — GADOBUTROL 1 MMOL/ML IV SOLN
7.5000 mL | Freq: Once | INTRAVENOUS | Status: AC | PRN
  Administered 2020-08-16: 7.5 mL via INTRAVENOUS

## 2020-08-17 MED ORDER — ASPIRIN EC 81 MG PO TBEC: 81.0000 mg | DELAYED_RELEASE_TABLET | Freq: Every day | ORAL | Status: AC

## 2020-08-17 MED ORDER — ACETAMINOPHEN 325 MG PO TABS: 650.0000 mg | ORAL_TABLET | ORAL | Status: DC | PRN

## 2020-08-17 NOTE — Evaluation (Signed)
Physical Therapy Evaluation Patient Details Name: Chris Braun MRN: [id number] DOB: 1935/05/29 Today's Date: 08/17/2020   History of Present Illness  Pt presented to ED 6/30 from ophthalmologist with bil papilledema. Pt found to have punctate left cerebellar stroke which neurology feels is an incidental finding. Unclear etiology for worsening papilledema. PMH -  CAD, MI, htn, asthma/COPD, stroke, dementia, NSVT/VT,  severe osteoporosis with multiple thoracic spine compression fractures, rt THR, rotator cuff repair, lumbar lam and fusion.  Clinical Impression  Pt presents to PT with slightly unsteady gait that is likely not far from pt's baseline. Able to amb around the unit with walker with min guard for safety. Recommend resume HHPT at DC.     Follow Up Recommendations Home health PT    Equipment Recommendations  None recommended by PT    Recommendations for Other Services       Precautions / Restrictions Precautions Precautions: Fall      Mobility  Bed Mobility Overal bed mobility: Needs Assistance Bed Mobility: Supine to Sit;Sit to Supine     Supine to sit: Supervision;HOB elevated Sit to supine: Supervision;HOB elevated   General bed mobility comments: Assist for safety    Transfers Overall transfer level: Needs assistance Equipment used: Rolling walker (2 wheeled) Transfers: Sit to/from Stand Sit to Stand: Min guard         General transfer comment: Assist for safety  Ambulation/Gait Ambulation/Gait assistance: Min guard Gait Distance (Feet): 150 Feet Assistive device: Rolling walker (2 wheeled) Gait Pattern/deviations: Step-through pattern;Decreased step length - right;Decreased step length - left;Trunk flexed Gait velocity: decr Gait velocity interpretation: 1.31 - 2.62 ft/sec, indicative of limited community ambulator General Gait Details: Assist for balance and safety.  Stairs            Wheelchair Mobility    Modified Rankin (Stroke  Patients Only)       Balance Overall balance assessment: Needs assistance Sitting-balance support: No upper extremity supported;Feet supported Sitting balance-Leahy Scale: Good     Standing balance support: Single extremity supported Standing balance-Leahy Scale: Poor Standing balance comment: UE support and min guard for static standing                             Pertinent Vitals/Pain Pain Assessment: No/denies pain    Home Living Family/patient expects to be discharged to:: Private residence Living Arrangements: Spouse/significant other Available Help at Discharge: Family;Available 24 hours/day Type of Home: House Home Access: Stairs to enter Entrance Stairs-Rails: None Entrance Stairs-Number of Steps: 1 Home Layout: One level Home Equipment: Clinical cytogeneticist - 2 wheels;Bedside commode;Wheelchair - manual      Prior Function Level of Independence: Needs assistance   Gait / Transfers Assistance Needed: Amb with rolling walker.           Hand Dominance   Dominant Hand: Right    Extremity/Trunk Assessment   Upper Extremity Assessment Upper Extremity Assessment: Defer to OT evaluation    Lower Extremity Assessment Lower Extremity Assessment: Generalized weakness       Communication   Communication: No difficulties  Cognition Arousal/Alertness: Awake/alert Behavior During Therapy: WFL for tasks assessed/performed Overall Cognitive Status: History of cognitive impairments - at baseline                                 General Comments: Baseline dementia. Oriented to person and place. Following 1 step commands  General Comments      Exercises     Assessment/Plan    PT Assessment Patient needs continued PT services  PT Problem List Decreased strength;Decreased balance;Decreased mobility       PT Treatment Interventions Gait training;Functional mobility training;Therapeutic activities;Therapeutic exercise;Balance  training;Patient/family education    PT Goals (Current goals can be found in the Care Plan section)  Acute Rehab PT Goals Patient Stated Goal: go home PT Goal Formulation: With patient Time For Goal Achievement: 08/24/20 Potential to Achieve Goals: Good    Frequency Min 3X/week   Barriers to discharge        Co-evaluation               AM-PAC PT "6 Clicks" Mobility  Outcome Measure Help needed turning from your back to your side while in a flat bed without using bedrails?: None Help needed moving from lying on your back to sitting on the side of a flat bed without using bedrails?: None Help needed moving to and from a bed to a chair (including a wheelchair)?: A Little Help needed standing up from a chair using your arms (e.g., wheelchair or bedside chair)?: A Little Help needed to walk in hospital room?: A Little Help needed climbing 3-5 steps with a railing? : A Little 6 Click Score: 20    End of Session Equipment Utilized During Treatment: Gait belt Activity Tolerance: Patient tolerated treatment well Patient left: in bed;with call bell/phone within reach Nurse Communication: Mobility status PT Visit Diagnosis: Unsteadiness on feet (R26.81);Muscle weakness (generalized) (M62.81)    Time: 8416-6063 PT Time Calculation (min) (ACUTE ONLY): 20 min   Charges:   PT Evaluation $PT Eval Moderate Complexity: [place] Pager [phone] Office [place] 08/17/2020, 10:36 AM

## 2020-08-17 NOTE — Discharge Instructions (Signed)

## 2020-08-17 NOTE — ED Provider Notes (Signed)
1:22 AM MRI c/w acute cerebellar infarct. Will admit to medicine. Neuro following.  1:45 AM Case discussed with Dr. Myna Hidalgo of [facility] who will admit.   MR Brain W and Wo Contrast  Result Date: 08/17/2020 CLINICAL DATA:  Optic nerve edema EXAM: MRI HEAD AND ORBITS WITHOUT AND WITH CONTRAST TECHNIQUE: Multiplanar, multiecho pulse sequences of the brain and surrounding structures were obtained without and with intravenous contrast. Multiplanar, multiecho pulse sequences of the orbits and surrounding structures were obtained including fat saturation techniques, before and after intravenous contrast administration. CONTRAST:  7.74mL GADAVIST GADOBUTROL 1 MMOL/ML IV SOLN COMPARISON:  09/28/2017 FINDINGS: MRI HEAD FINDINGS Brain: Small focus of abnormal diffusion restriction in the left cerebellum. Chronic microhemorrhage in left cerebellum. Hemosiderin deposition within the right cerebellum Confluent hyperintense T2-weighted white matter signal. Diffuse, severe atrophy. Multiple cerebellar infarcts. There is no abnormal contrast enhancement. Vascular: Major flow voids are preserved. Skull and upper cervical spine: Normal calvarium and skull base. Visualized upper cervical spine and soft tissues are normal. Sinuses/Orbits:No paranasal sinus fluid levels or advanced mucosal thickening. No mastoid or middle ear effusion. Normal orbits. MRI ORBITS FINDINGS Orbits: --Globes: Normal. --Bony orbit: Normal. --Preseptal soft tissues: Normal. --Intra- and extraconal orbital fat: Normal. No inflammatory stranding. --Optic nerves: Normal. --Lacrimal glands and fossae: Normal. --Extraocular muscles: Normal. Visualized sinuses:  No fluid levels or advanced mucosal thickening. Soft tissues: Normal. Limited intracranial: Normal. IMPRESSION: 1. Small acute infarct of the left cerebellum. No hemorrhage or mass effect. 2. Normal MRI of the orbits. Electronically Signed   By: Ulyses Jarred M.D.   On: 08/17/2020 00:55   MR ORBITS W WO  CONTRAST  Result Date: 08/17/2020 CLINICAL DATA:  Optic nerve edema EXAM: MRI HEAD AND ORBITS WITHOUT AND WITH CONTRAST TECHNIQUE: Multiplanar, multiecho pulse sequences of the brain and surrounding structures were obtained without and with intravenous contrast. Multiplanar, multiecho pulse sequences of the orbits and surrounding structures were obtained including fat saturation techniques, before and after intravenous contrast administration. CONTRAST:  7.40mL GADAVIST GADOBUTROL 1 MMOL/ML IV SOLN COMPARISON:  09/28/2017 FINDINGS: MRI HEAD FINDINGS Brain: Small focus of abnormal diffusion restriction in the left cerebellum. Chronic microhemorrhage in left cerebellum. Hemosiderin deposition within the right cerebellum Confluent hyperintense T2-weighted white matter signal. Diffuse, severe atrophy. Multiple cerebellar infarcts. There is no abnormal contrast enhancement. Vascular: Major flow voids are preserved. Skull and upper cervical spine: Normal calvarium and skull base. Visualized upper cervical spine and soft tissues are normal. Sinuses/Orbits:No paranasal sinus fluid levels or advanced mucosal thickening. No mastoid or middle ear effusion. Normal orbits. MRI ORBITS FINDINGS Orbits: --Globes: Normal. --Bony orbit: Normal. --Preseptal soft tissues: Normal. --Intra- and extraconal orbital fat: Normal. No inflammatory stranding. --Optic nerves: Normal. --Lacrimal glands and fossae: Normal. --Extraocular muscles: Normal. Visualized sinuses:  No fluid levels or advanced mucosal thickening. Soft tissues: Normal. Limited intracranial: Normal. IMPRESSION: 1. Small acute infarct of the left cerebellum. No hemorrhage or mass effect. 2. Normal MRI of the orbits. Electronically Signed   By: Ulyses Jarred M.D.   On: 08/17/2020 00:55      Antonietta Breach, PA-C 08/17/20 0159    Ripley Fraise, MD 08/17/20 [phone]

## 2020-08-17 NOTE — H&P (Signed)
History and Physical    RYOTT RAFFERTY UDJ:[id number] DOB: 1936-02-07 DOA: 08/16/2020  PCP: Penelope Coop, FNP   Patient coming from: Home   Chief Complaint: "increased optic nerve edema," sent from ophthalmologist   HPI: Chris Braun is a 85 y.o. male with medical history significant for paroxysmal atrial fibrillation on Eliquis, nonsustained VT, memory loss, COPD, macular degeneration, and visual acuity deficit who presents at the direction of his ophthalmologist for evaluation of increasing optic nerve edema bilaterally.  Patient describes chronic vision deficits, worse involving the left eye, was reportedly noted to have optic nerve edema during ophthalmology visit in May, was seen yesterday in follow-up and sent to the ED for further evaluation of increasing edema bilaterally.  Patient denies any recent focal numbness or weakness, has not noticed any recent worsening in balance or coordination, and denies fevers or chills.  He lives with his wife, requires assistance for his ADLs, and ambulates with a walker.  ED Course: Upon arrival to the ED, patient is found to be afebrile, saturating well on room air, and with stable blood pressure.  Chemistry panel notable for creatinine 1.42.  CBC is unremarkable.  CRP is 1.2 and ESR 12.  MRI orbits was a normal study and MRI brain notable for small acute left cerebellar infarction.  Neurology was consulted by the ED physician and hospitalists asked to admit.   Review of Systems:  All other systems reviewed and apart from HPI, are negative.  Past Medical History:  Diagnosis Date   Acute hypoxemic respiratory failure ([place]) 05/27/2018   AKI (acute kidney injury) ([place]) 05/27/2018   Aspiration into airway    Asthma    Atrial tachycardia ([place]) 07/26/2018   B12 deficiency    CAD (coronary artery disease) 08/24/2014   Carotid stenosis, bilateral 08/24/2014   Cataracts, bilateral    Compression fracture of L2 lumbar vertebra, closed, initial encounter ([place])  10/27/2019   COPD (chronic obstructive pulmonary disease) ([place])    Depression    Diverticulosis    per colonscopy   Dyslipidemia 08/24/2014   Falls frequently 06/03/2015   Food impaction of esophagus    Gait abnormality 01/19/2018   GERD (gastroesophageal reflux disease)    Hiatal hernia    High cholesterol    Hypertension    Hypoxia    Idiopathic progressive polyneuropathy    Kidney stones    Lumbar vertebral fracture, pathologic 10/27/2019   Macular degeneration    Memory disorder 01/19/2018   Myocardial infarction [facility]) 2002   Oculomotor nerve palsy, 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   Osteoporosis    Right kidney stone 05/27/2018   Right upper lobe pneumonia 05/27/2018   Risk for falls 05/28/2015   RLS (restless legs syndrome) 04/05/2020   Status post ablation of ventricular arrhythmia 04/27/2018   Stroke (cerebrum) ([place]) 01/17/2014   Stroke ([place])    SVT (supraventricular tachycardia) ([place]) 12/09/2019   Weakness of distal arms and legs 06/03/2015    Past Surgical History:  Procedure Laterality Date   CARDIAC CATHETERIZATION  2002,   stents    CATARACT EXTRACTION Left 2016   Colonscopy  2012   CYSTOSCOPY W/ URETERAL STENT PLACEMENT Right 05/30/2018   Procedure: CYSTOSCOPY WITH RETROGRADE PYELOGRAM/URETERAL STENT PLACEMENT;  Surgeon: Irine Seal, MD;  Location: [place];  Service: Urology;  Laterality: Right;   ESOPHAGOGASTRODUODENOSCOPY (EGD) WITH PROPOFOL N/A 02/10/2020   Procedure: ESOPHAGOGASTRODUODENOSCOPY (EGD) WITH PROPOFOL;  Surgeon: Yetta Flock, MD;  Location: [place];  Service: Gastroenterology;  Laterality: N/A;  FOREIGN BODY REMOVAL  02/10/2020   Procedure: FOREIGN BODY REMOVAL;  Surgeon: Yetta Flock, MD;  Location: [facility] ENDOSCOPY;  Service: Gastroenterology;;   HIP FRACTURE SURGERY  2008   right   HYDROCELE EXCISION  10/12/2014   LITHOTRIPSY     2007, 2009, 2010, 2011, 2012, 2013   LUMBAR LAMINECTOMY/DECOMPRESSION MICRODISCECTOMY  03/15/2012   Procedure: LUMBAR  LAMINECTOMY/DECOMPRESSION MICRODISCECTOMY 1 LEVEL;  Surgeon: Elaina Hoops, MD;  Location: [place] NEURO ORS;  Service: Neurosurgery;  Laterality: Left;  Left lumbar three-four decompressive lumbar laminectomy, discectomy   MAXIMUM ACCESS (MAS)POSTERIOR LUMBAR INTERBODY FUSION (PLIF) 2 LEVEL N/A 12/08/2012   Procedure: FOR MAXIMUM ACCESS (MAS) POSTERIOR LUMBAR INTERBODY FUSION (PLIF) 2 LEVEL;  Surgeon: Elaina Hoops, MD;  Location: [place] NEURO ORS;  Service: Neurosurgery;  Laterality: N/A;  FOR MAXIMUM ACCESS (MAS) POSTERIOR LUMBAR INTERBODY FUSION (PLIF) 2 LEVEL   ROTATOR CUFF REPAIR  1999   bil    SALIVARY STONE REMOVAL  1964   1964   TOTAL HIP ARTHROPLASTY Right 2008   VT study     with Ablation    Social History:   reports that he quit smoking about 19 years ago. His smoking use included cigarettes. He has a 45.00 pack-year smoking history. His smokeless tobacco use includes chew. He reports that he does not drink alcohol and does not use drugs.  Allergies  Allergen Reactions   Other Nausea And Vomiting and Other (See Comments)    pneumonia vaccine- chills, vomiting, fever, lost use of legs and body function. Had a fall post inj. (05/25/2013)   Pneumococcal Polysaccharide Vaccine Nausea And Vomiting and Other (See Comments)    Chills, loss of bodily functions, ended up in the ED Other reaction(s): GI Upset (intolerance) Chills, loss of bodily functions, ended up in the ED   Hydrocodone-Acetaminophen Other (See Comments)    Must have Zofran to tolerate this Must have Zofran to tolerate this   Ibuprofen Other (See Comments)    Heart doctor advised he cannot take this   Oxycodone Other (See Comments)    Mental status changes and causes sleepwalking Mental status changes Mental status changes and causes sleepwalking    Family History  Problem Relation Age of Onset   Hemolytic uremic syndrome Mother    Heart disease Mother    Stroke Father    Heart disease Father    Heart disease Sister     Heart disease Sister    Heart disease Sister    Alzheimer's disease Sister      Prior to Admission medications   Medication Sig Start Date End Date Taking? Authorizing Provider  Acetaminophen 325 MG CAPS Take 975 mg by mouth every 8 (eight) hours as needed for pain (pain).    [provider]  albuterol (PROVENTIL) (2.5 MG/3ML) 0.083% nebulizer solution Take 2.5 mg by nebulization every 6 (six) hours as needed for wheezing or shortness of breath. 01/13/20   [provider]  amiodarone (PACERONE) 200 MG tablet Take 100 mg by mouth daily.    [provider]  apixaban (ELIQUIS) 5 MG TABS tablet Take 1 tablet (5 mg total) by mouth 2 (two) times daily. 06/08/20   Revankar, Reita Cliche, MD  aspirin EC 81 MG tablet Take 81 mg by mouth at bedtime.     [provider]  atorvastatin (LIPITOR) 80 MG tablet Take 40 mg by mouth at bedtime.    [provider]  calcitonin, salmon, (MIACALCIN/FORTICAL) 200 UNIT/ACT nasal spray Place 1  spray into alternate nostrils at bedtime.    [provider]  Calcium Carb-Cholecalciferol (CALCIUM-VITAMIN D3) 600-200 MG-UNIT TABS Take 600 mg by mouth 2 (two) times daily.    [provider]  Cholecalciferol (VITAMIN D) 50 MCG (2000 UT) CAPS Take 2,000 Units by mouth daily.    [provider]  digoxin (LANOXIN) 0.125 MG tablet TAKE ONE TABLET BY MOUTH EVERY OTHER DAY Patient taking differently: Take 0.125 mg by mouth every other day. 08/06/20   Revankar, Reita Cliche, MD  donepezil (ARICEPT) 5 MG tablet Take 5 mg by mouth at bedtime.  11/05/15   [provider]  Ferrous Gluconate (IRON 27 PO) Take 27 mg by mouth every evening.    [provider]  furosemide (LASIX) 20 MG tablet Take 20 mg by mouth daily.    [provider]  ipratropium-albuterol (DUONEB) 0.5-2.5 (3) MG/3ML SOLN Take 3 mLs by nebulization 2 (two) times daily.     [provider]  ketoconazole (NIZORAL) 2 % cream  Apply 1 application topically daily as needed for irritation (on feet). Apply as needed to affected area.   04/25/19   [provider]  lactulose (CHRONULAC) 10 GM/15ML solution Take 10 g by mouth at bedtime as needed for mild constipation.     [provider]  loperamide (IMODIUM) 2 MG capsule Take 2-4 mg by mouth as needed for diarrhea or loose stools.    [provider]  loratadine (CLARITIN) 10 MG tablet Take 10 mg by mouth in the morning.     [provider]  Lutein 20 MG TABS Take 20 mg by mouth daily.    [provider]  memantine (NAMENDA) 5 MG tablet Take 5 mg by mouth 2 (two) times daily.    [provider]  methocarbamol (ROBAXIN) 500 MG tablet Take 500 mg by mouth 3 (three) times daily as needed for pain. 11/23/19   [provider]  montelukast (SINGULAIR) 10 MG tablet Take 10 mg by mouth daily.     [provider]  Multiple Vitamins-Minerals (MULTIVITAMIN PO) Take 1 tablet by mouth every evening.     [provider]  nitroGLYCERIN (NITROSTAT) 0.4 MG SL tablet Place 0.4 mg under the tongue every 5 (five) minutes as needed for chest pain.    [provider]  ondansetron (ZOFRAN-ODT) 4 MG disintegrating tablet Take 4 mg by mouth every 8 (eight) hours as needed for nausea or vomiting.  12/07/19   [provider]  pantoprazole (PROTONIX) 40 MG tablet Take 1 tablet (40 mg total) by mouth 2 (two) times daily. 02/16/20   Swayze, Ava, DO  potassium chloride (KLOR-CON) 10 MEQ tablet Take 10 mEq by mouth as directed. When taking Lasix    [provider]  rOPINIRole (REQUIP) 1 MG tablet Take 1 mg by mouth 3 (three) times daily.    [provider]  sucralfate (CARAFATE) 1 GM/10ML suspension Take 10 mLs (1 g total) by mouth 4 (four) times daily -  with meals and at bedtime. 02/16/20   Swayze, Ava, DO  SYMBICORT 160-4.5 MCG/ACT inhaler Inhale 2 puffs into the lungs 2 (two) times daily.  07/21/16   [provider]  tamsulosin (FLOMAX) 0.4 MG CAPS capsule Take 0.4 mg by mouth daily after supper.    [provider]  vitamin B-12 (CYANOCOBALAMIN) 1000 MCG tablet Take 2,000 mcg by mouth daily.    [provider]    Physical Exam: Vitals:   08/16/20 2200 08/16/20  2215 08/16/20 2230 08/17/20 0130  BP: 133/74 (!) 142/76 124/74 117/70  Pulse: 60 60 61 (!) 54  Resp: '16 18 18 ' (!) 21  Temp:      TempSrc:      SpO2: 93% 92% 92% 97%    Constitutional: NAD, calm  Eyes: PERTLA, lids and conjunctivae normal ENMT: Mucous membranes are moist. Posterior pharynx clear of any exudate or lesions.   Neck:  supple, no masses  Respiratory: no wheezing, no crackles. No accessory muscle use.  Cardiovascular: S1 & S2 heard, regular rate and rhythm. No extremity edema.   Abdomen: No distension, no tenderness, soft. Bowel sounds active.  Musculoskeletal: no clubbing / cyanosis. No joint deformity upper and lower extremities.   Skin: no significant rashes, lesions, ulcers. Warm, dry, well-perfused. Neurologic: no gross facial asymmetry. No dysarthria or aphasia. Sensation intact. Strength 5/5 in all 4 limbs.  Psychiatric: Alert and oriented to person, place, and situation. Pleasant and cooperative.    Labs and Imaging on Admission: I have personally reviewed following labs and imaging studies  CBC: Recent Labs  Lab 08/16/20 1815  WBC 8.7  NEUTROABS 6.3  HGB 14.3  HCT 42.4  MCV 91.2  PLT 574   Basic Metabolic Panel: Recent Labs  Lab 08/16/20 1815  NA 139  K 4.0  CL 103  CO2 25  GLUCOSE 148*  BUN 10  CREATININE 1.42*  CALCIUM 8.9   GFR: CrCl cannot be calculated (Unknown ideal weight.). Liver Function Tests: Recent Labs  Lab 08/16/20 1815  AST 30  ALT 19  ALKPHOS 87  BILITOT 0.7  PROT 6.5  ALBUMIN 3.3*   No results for input(s): LIPASE, AMYLASE in the last 168 hours. No results for input(s): AMMONIA in the last 168 hours. Coagulation  Profile: No results for input(s): INR, PROTIME in the last 168 hours. Cardiac Enzymes: No results for input(s): CKTOTAL, CKMB, CKMBINDEX, TROPONINI in the last 168 hours. BNP (last 3 results) Recent Labs    06/08/20 1502  PROBNP 487*   HbA1C: No results for input(s): HGBA1C in the last 72 hours. CBG: No results for input(s): GLUCAP in the last 168 hours. Lipid Profile: No results for input(s): CHOL, HDL, LDLCALC, TRIG, CHOLHDL, LDLDIRECT in the last 72 hours. Thyroid Function Tests: No results for input(s): TSH, T4TOTAL, FREET4, T3FREE, THYROIDAB in the last 72 hours. Anemia Panel: No results for input(s): VITAMINB12, FOLATE, FERRITIN, TIBC, IRON, RETICCTPCT in the last 72 hours. Urine analysis:    Component Value Date/Time   COLORURINE YELLOW 12/09/2019 [place] 12/09/2019 1757   LABSPEC 1.016 12/09/2019 1757   PHURINE 5.0 12/09/2019 1757   GLUCOSEU NEGATIVE 12/09/2019 1757   HGBUR NEGATIVE 12/09/2019 1757   BILIRUBINUR NEGATIVE 12/09/2019 1757   KETONESUR NEGATIVE 12/09/2019 1757   PROTEINUR NEGATIVE 12/09/2019 1757   NITRITE NEGATIVE 12/09/2019 1757   LEUKOCYTESUR NEGATIVE 12/09/2019 1757   Sepsis Labs: '@LABRCNTIP' (procalcitonin:4,lacticidven:4) )No results found for this or any previous visit (from the past 240 hour(s)).   Radiological Exams on Admission: MR Brain W and Wo Contrast  Result Date: 08/17/2020 CLINICAL DATA:  Optic nerve edema EXAM: MRI HEAD AND ORBITS WITHOUT AND WITH CONTRAST TECHNIQUE: Multiplanar, multiecho pulse sequences of the brain and surrounding structures were obtained without and with intravenous contrast. Multiplanar, multiecho pulse sequences of the orbits and surrounding structures were obtained including fat saturation techniques, before and after intravenous contrast administration. CONTRAST:  7.58m GADAVIST GADOBUTROL 1 MMOL/ML IV SOLN COMPARISON:  09/28/2017 FINDINGS: MRI HEAD  FINDINGS Brain: Small focus of abnormal  diffusion restriction in the left cerebellum. Chronic microhemorrhage in left cerebellum. Hemosiderin deposition within the right cerebellum Confluent hyperintense T2-weighted white matter signal. Diffuse, severe atrophy. Multiple cerebellar infarcts. There is no abnormal contrast enhancement. Vascular: Major flow voids are preserved. Skull and upper cervical spine: Normal calvarium and skull base. Visualized upper cervical spine and soft tissues are normal. Sinuses/Orbits:No paranasal sinus fluid levels or advanced mucosal thickening. No mastoid or middle ear effusion. Normal orbits. MRI ORBITS FINDINGS Orbits: --Globes: Normal. --Bony orbit: Normal. --Preseptal soft tissues: Normal. --Intra- and extraconal orbital fat: Normal. No inflammatory stranding. --Optic nerves: Normal. --Lacrimal glands and fossae: Normal. --Extraocular muscles: Normal. Visualized sinuses:  No fluid levels or advanced mucosal thickening. Soft tissues: Normal. Limited intracranial: Normal. IMPRESSION: 1. Small acute infarct of the left cerebellum. No hemorrhage or mass effect. 2. Normal MRI of the orbits. Electronically Signed   By: Ulyses Jarred M.D.   On: 08/17/2020 00:55   MR ORBITS W WO CONTRAST  Result Date: 08/17/2020 CLINICAL DATA:  Optic nerve edema EXAM: MRI HEAD AND ORBITS WITHOUT AND WITH CONTRAST TECHNIQUE: Multiplanar, multiecho pulse sequences of the brain and surrounding structures were obtained without and with intravenous contrast. Multiplanar, multiecho pulse sequences of the orbits and surrounding structures were obtained including fat saturation techniques, before and after intravenous contrast administration. CONTRAST:  7.8m GADAVIST GADOBUTROL 1 MMOL/ML IV SOLN COMPARISON:  09/28/2017 FINDINGS: MRI HEAD FINDINGS Brain: Small focus of abnormal diffusion restriction in the left cerebellum. Chronic microhemorrhage in left cerebellum. Hemosiderin deposition within the right cerebellum Confluent hyperintense T2-weighted  white matter signal. Diffuse, severe atrophy. Multiple cerebellar infarcts. There is no abnormal contrast enhancement. Vascular: Major flow voids are preserved. Skull and upper cervical spine: Normal calvarium and skull base. Visualized upper cervical spine and soft tissues are normal. Sinuses/Orbits:No paranasal sinus fluid levels or advanced mucosal thickening. No mastoid or middle ear effusion. Normal orbits. MRI ORBITS FINDINGS Orbits: --Globes: Normal. --Bony orbit: Normal. --Preseptal soft tissues: Normal. --Intra- and extraconal orbital fat: Normal. No inflammatory stranding. --Optic nerves: Normal. --Lacrimal glands and fossae: Normal. --Extraocular muscles: Normal. Visualized sinuses:  No fluid levels or advanced mucosal thickening. Soft tissues: Normal. Limited intracranial: Normal. IMPRESSION: 1. Small acute infarct of the left cerebellum. No hemorrhage or mass effect. 2. Normal MRI of the orbits. Electronically Signed   By: KUlyses JarredM.D.   On: 08/17/2020 00:55     Assessment/Plan   1. Acute ischemic CVA; ?optic neuropathy   - Patient has no acute complaints but presents at direction of ophthalmology for evaluation of increased optic nerve edema and is found to have normal MRI orbits and small acute left cerebellar infarction on MRI brain  - Neurology consulting and much appreciated, will follow-up on recommendations     2. Paroxysmal atrial fibrillation; NSVT  - Continue amiodarone, hold Eliquis pending neurology recommendations    3. Renal insufficiency  - Unclear baseline, likely CKD IIIa  - SCr is 1.42 on admission, up from 1.21 in April 2022  - Renally-dose medications, hold Lasix initially, monitor    4. COPD  - Appears stable - Continue ICS/LABA and as-needed albuterol    5. Memory loss  - Continue Aricept and Namenda     DVT prophylaxis: Lovenox  Code Status: Full  Level of Care: Level of care: Telemetry Medical Family Communication: Wife updated from ED   Disposition Plan:  Patient is from: Home  Anticipated d/c is to: home Anticipated d/c date  is: 08/18/20 Patient currently: Pending neurology consultation  Consults called: Neurology  Admission status: Observation     Vianne Bulls, MD [facility]  08/17/2020, 3:27 AM

## 2020-08-17 NOTE — ED Notes (Signed)
Notified provider about pt low HR.

## 2020-08-17 NOTE — Evaluation (Signed)
Occupational Therapy Evaluation Patient Details Name: Chris Braun MRN: [id number] DOB: 1936/02/01 Today's Date: 08/17/2020    History of Present Illness Pt presented to ED 6/30 from ophthalmologist with bil papilledema. Pt found to have punctate left cerebellar stroke which neurology feels is an incidental finding. Unclear etiology for worsening papilledema. PMH -  CAD, MI, htn, asthma/COPD, stroke, dementia, NSVT/VT,  severe osteoporosis with multiple thoracic spine compression fractures, rt THR, rotator cuff repair, lumbar lam and fusion.   Clinical Impression   Patient admitted for the diagnosis above.  It sounds as if he is close to his baseline: needing assist for ADL and mobility due to unsteadiness.  He is currently working with [facility] PT, and it is recommended he continue.  [facility] OT can be referred by PT if needed.  Barriers are listed below.  No acute OT is indicated, he will need balance support for any stand activities at RW level.      Follow Up Recommendations  Other (comment) (Patient is seeing [place] PT, if HHPT sees an OT need, they can refer [facility] OT.)    Equipment Recommendations       Recommendations for Other Services       Precautions / Restrictions Precautions Precautions: Fall Restrictions Weight Bearing Restrictions: No      Mobility Bed Mobility Overal bed mobility: Needs Assistance Bed Mobility: Supine to Sit;Sit to Supine     Supine to sit: Supervision Sit to supine: Supervision   General bed mobility comments: Assist for safety Patient Response: Cooperative  Transfers Overall transfer level: Needs assistance Equipment used: Rolling walker (2 wheeled) Transfers: Sit to/from Stand Sit to Stand: Supervision;Min guard         General transfer comment: Assist for safety    Balance Overall balance assessment: Needs assistance Sitting-balance support: No upper extremity supported;Feet supported Sitting balance-Leahy Scale: Good     Standing balance  support: Bilateral upper extremity supported;Single extremity supported Standing balance-Leahy Scale: Poor Standing balance comment: relies on external support                           ADL either performed or assessed with clinical judgement   ADL Overall ADL's : At baseline                                       General ADL Comments: needing steadining for use of urinal, assist for lower body ADL.     Vision Patient Visual Report: Blurring of vision Additional Comments: admitted with bil papilledema, and increasing blurred vision.                Pertinent Vitals/Pain Pain Assessment: Faces Faces Pain Scale: Hurts a little bit Pain Location: neck and upper back Pain Descriptors / Indicators: Tightness;Sore Pain Intervention(s): Monitored during session     Hand Dominance Right   Extremity/Trunk Assessment Upper Extremity Assessment Upper Extremity Assessment: Generalized weakness   Lower Extremity Assessment Lower Extremity Assessment: Defer to PT evaluation   Cervical / Trunk Assessment Cervical / Trunk Assessment: Kyphotic   Communication Communication Communication: No difficulties   Cognition Arousal/Alertness: Awake/alert Behavior During Therapy: WFL for tasks assessed/performed Overall Cognitive Status: History of cognitive impairments - at baseline  General Comments: ST memory deficit noted.  Following commands, decreased safety.   General Comments        Home Living Family/patient expects to be discharged to:: Private residence Living Arrangements: Spouse/significant other Available Help at Discharge: Family;Available 24 hours/day Type of Home: House Home Access: Stairs to enter [employer] of Steps: 1 Entrance Stairs-Rails: None Home Layout: One level     Bathroom Shower/Tub: Teacher, early years/pre: Standard Bathroom Accessibility: Yes How  Accessible: Accessible via walker Home Equipment: Shower seat;Walker - 2 wheels;Bedside commode;Wheelchair - manual          Prior Functioning/Environment Level of Independence: Needs assistance  Gait / Transfers Assistance Needed: Amb with rolling walker. ADL's / Homemaking Assistance Needed: per patient: spouse assists with bathing, dressing, toileting due to balance issues.  Spouse drives and assists with medications.            OT Problem List: Decreased activity tolerance;Impaired balance (sitting and/or standing);Decreased strength      OT Treatment/Interventions:      OT Goals(Current goals can be found in the care plan section) Acute Rehab OT Goals Patient Stated Goal: Hoping to go home today OT Goal Formulation: With patient Time For Goal Achievement: 08/17/20 Potential to Achieve Goals: Good  OT Frequency:     Barriers to D/C:  None noted          Co-evaluation              AM-PAC OT "6 Clicks" Daily Activity     Outcome Measure Help from another person eating meals?: None Help from another person taking care of personal grooming?: None Help from another person toileting, which includes using toliet, bedpan, or urinal?: A Little Help from another person bathing (including washing, rinsing, drying)?: A Little Help from another person to put on and taking off regular upper body clothing?: None Help from another person to put on and taking off regular lower body clothing?: A Little 6 Click Score: 21   End of Session Equipment Utilized During Treatment: Rolling walker  Activity Tolerance: Patient tolerated treatment well Patient left: in bed;with call bell/phone within reach  OT Visit Diagnosis: Unsteadiness on feet (R26.81);Muscle weakness (generalized) (M62.81)                Time: 6045-4098 OT Time Calculation (min): 16 min Charges:  OT General Charges $OT Visit: 1 Visit OT Evaluation $OT Eval Moderate Complexity: 1 Mod  08/17/2020  Rich,  OTR/L  Acute Rehabilitation Services  Office:  [place] 08/17/2020, 12:23 PM

## 2020-08-17 NOTE — Progress Notes (Addendum)
Brief neurology note Seen and examined.  No significant change from exam earlier this morning by Dr. Wynell Balloon. Discussed with interventional radiology about the timing of the LP-given delayed renal function and him being on DOAC and aspirin, it would be better to defer the spinal tap for at least 48 to 72 hours.  He will be scheduled for a spinal tap on Tuesday, August 21, 2020-radiology aware and will schedule. I do not see any further need for inpatient work-up at this time. Left cerebellar stroke is likely incidental-no further work-up needed.  Known A. fib-likely the etiology of the stroke. He will need to be on anticoagulation long-term which will be on hold till Tuesday for the spinal tap.  After the spinal tap he can resume his Eliquis after 8 hours.  Discussed my plan with Dr. Algis Liming Needs follow-up with outpatient ophthalmology in a week and with [facility] neurology in 2 to 4 weeks.  -- Amie Portland, MD Neurologist [facility] Pager: [phone]

## 2020-08-17 NOTE — Discharge Summary (Signed)
Physician Discharge Summary  Chris Braun WFU:[id number] DOB: 1935-11-15  PCP: Penelope Coop, FNP  Admitted from: Home Discharged to: Home  Admit date: 08/16/2020 Discharge date: 08/17/2020  Recommendations for Outpatient Follow-up:    Follow-up Information     Radiology at [facility] Follow up on 08/21/2020.   Contact information: Please arrive at 8:30 AM on August 21, 2020. Please call [phone] with scheduling questions.        Wandra Feinstein Ernestine Mcmurray, O.D. (Eye Specialist). Call.   Why: Call as soon as possible for follow-up appointment to be seen next week. Contact information: [address] [place], [place] [address] Local: [phone] Toll-free: [phone] Fax: [phone]        [facility]. Schedule an appointment as soon as possible for a visit in 2 week(s).   Why: Hospital follow up with Dr. Jannifer Franklin. Contact information: [address]     [place] [address] [phone]        Penelope Coop, FNP. Schedule an appointment as soon as possible for a visit in 1 week(s).   Specialty: Family Medicine Why: To be seen with repeat labs (CBC & BMP).  Kindly ensure patient follows up on outpatient spinal tap, consultations with neurology and ophthalmology. Contact information: [place] [address] [phone]         Constance Haw, MD .   Specialty: Cardiology Contact information: [place] [address] [phone]         Jenean Lindau, MD .   Specialty: Cardiology Contact information: [place] [address] [place]:  [place] Orders (From admission, onward)     Start     Ordered   08/17/20 [place]  At discharge       Question:  To provide the following care/treatments  Answer:  PT   08/17/20 1156             Equipment/Devices: None     Discharge Condition: Improved and stable.   Code Status: Full Code Diet recommendation:  Discharge Diet Orders (From admission, onward)     Start     Ordered   08/17/20 0000  Diet - low sodium heart healthy        08/17/20 1156             Discharge Diagnoses:  Principal Problem:   Acute ischemic stroke [facility]) Active Problems:   Memory disorder   Renal insufficiency   Hypertension   COPD (chronic obstructive pulmonary disease) (HCC)   AF (paroxysmal atrial fibrillation) (HCC)   Brief Summary: 85 year old married male, medical history significant for paroxysmal atrial fibrillation on Eliquis, cerebrovascular disease, bilateral carotid stenosis, hypertension, hyperlipidemia, restless leg syndrome, nonsustained VT, dementia/memory loss, COPD, macular degeneration and visual acuity deficit who presented to the [facility] ED at the direction of his ophthalmologist for evaluation of increased optic nerve edema bilaterally.  He describes chronic vision deficits, worse involving the left eye, was reportedly noted to have optic nerve edema during ophthalmology visit in May, was seen day prior to this ED visit and follow-up and sent to the ED for further evaluation of increasing edema bilaterally.  He denied any recent focal numbness or weakness.  He had not noticed any recent worsening in balance or coordination.  He lives with his wife, requires assistance for his ADLs and ambulates with a walker.  He was admitted for evaluation of bilateral papilledema.  Neurology was consulted.  Assessment and plan:  Bilateral papilledema: Neurology was consulted.  They indicated that patient presented with worsening papilledema of unclear etiology, left worse than the right.  Lab work unrevealing.  MRI brain and orbits unrevealing.  They suspect that the incidental cerebellar stroke is in the setting of holding anticoagulation with known atrial fibrillation.  Patient already medically optimized  from a stroke risk perspective and they did not think there was any benefit in repeating full stroke work-up at this time.  There is differential diagnosis was: Diabetic papillopathy (although I do not see history of DM and there is no A1c in CH[facility]), obstructive sleep apnea, optic neuritis, thyroid ophthalmopathy, infection/inflammation not detected on MRI, intracranial hypertension. TSH normal.  Neurology has followed up this morning.  LP recommended and has to be delayed due to being on Eliquis and aspirin.  Last dose of Eliquis was on 6/30 in the morning.  Neurology has arranged for LP with IR on 7/5 in the morning and recommend holding both Eliquis and aspirin for the procedure.  These meds can be resumed after 8 hours after the LP.  I discussed in detail with Dr. Rory Percy, neuro hospitalist who has cleared patient for discharge, recommends outpatient follow-up with ophthalmology and neurology post LP.  I was unable to reach patient's spouse via phone.  I discussed in detail with one of his daughters via phone, updated care and answered all questions including clear instructions above.  Paroxysmal atrial fibrillation/NSVT: Telemetry showed SB in the 50s-SR in the 60s.  Continue prior home dose of amiodarone.  Eliquis management as noted above.  Outpatient follow-up with cardiology.  CKD stage III a: Presented with creatinine of 1.42 which is improved to 1.24.  He did have normal creatinines of 0.8 and 1.03 in December 2021.  It is clearly possible that he actually may have CKD stage II.  Outpatient follow-up with repeat BMP.-At time of discharge has improved to 1.2.  COPD: Stable.  Dementia with memory loss: Continue Aricept and Namenda.   Consultations: Neurology  Procedures: None   Discharge Instructions  Discharge Instructions     Ambulatory referral to Neurology   Complete by: As directed    An appointment is requested in approximately: 2 weeks   Call MD for:  difficulty breathing,  headache or visual disturbances   Complete by: As directed    Call MD for:  extreme fatigue   Complete by: As directed    Call MD for:  persistant dizziness or light-headedness   Complete by: As directed    Diet - low sodium heart healthy   Complete by: As directed    Increase activity slowly   Complete by: As directed         Medication List     STOP taking these medications    sucralfate 1 GM/10ML suspension Commonly known as: CARAFATE       TAKE these medications    acetaminophen 650 MG CR tablet Commonly known as: TYLENOL Take 1,300 mg by mouth every 8 (eight) hours as needed for pain.   amiodarone 200 MG tablet Commonly known as: PACERONE Take 100 mg by mouth daily.   apixaban 5 MG Tabs tablet Commonly known as: ELIQUIS Take 1 tablet (5 mg total) by mouth 2 (two) times daily. Holding for spinal tap on 08/21/2020.  May  resume after 8 hours after the spinal tap. Start taking on: August 21, 2020 What changed:  additional instructions These instructions start on August 21, 2020. If you are unsure what to do until then, ask your doctor or other care provider.   aspirin EC 81 MG tablet Take 1 tablet (81 mg total) by mouth at bedtime. Holding for spinal tap on 08/21/2020.  May resume after 8 hours after the spinal tap. Start taking on: August 21, 2020 What changed:  additional instructions These instructions start on August 21, 2020. If you are unsure what to do until then, ask your doctor or other care provider.   atorvastatin 80 MG tablet Commonly known as: LIPITOR Take 80 mg by mouth at bedtime.   benzonatate 200 MG capsule Commonly known as: TESSALON Take 200 mg by mouth 3 (three) times daily as needed for cough.   calcitonin (salmon) 200 UNIT/ACT nasal spray Commonly known as: MIACALCIN/FORTICAL Place 1 spray into alternate nostrils at bedtime.   Calcium-Vitamin D3 600-200 MG-UNIT Tabs Take 600 mg by mouth 2 (two) times daily.   digoxin 0.125 MG tablet Commonly  known as: LANOXIN TAKE ONE TABLET BY MOUTH EVERY OTHER DAY   docusate sodium 100 MG capsule Commonly known as: COLACE Take 100 mg by mouth at bedtime.   donepezil 5 MG tablet Commonly known as: ARICEPT Take 5 mg by mouth at bedtime.   furosemide 20 MG tablet Commonly known as: LASIX Take 20 mg by mouth daily as needed for fluid.   ipratropium-albuterol 0.5-2.5 (3) MG/3ML Soln Commonly known as: DUONEB Take 3 mLs by nebulization 2 (two) times daily.   IRON 27 PO Take 27 mg by mouth every evening.   ketoconazole 2 % cream Commonly known as: NIZORAL Apply 1 application topically daily as needed for irritation (on feet). Apply as needed to affected area.   lactulose 10 GM/15ML solution Commonly known as: CHRONULAC Take 10 g by mouth at bedtime as needed for mild constipation.   loperamide 2 MG capsule Commonly known as: IMODIUM Take 2-4 mg by mouth as needed for diarrhea or loose stools.   loratadine 10 MG tablet Commonly known as: CLARITIN Take 10 mg by mouth in the morning.   Lutein 20 MG Tabs Take 20 mg by mouth daily.   memantine 5 MG tablet Commonly known as: NAMENDA Take 5 mg by mouth 2 (two) times daily.   methocarbamol 500 MG tablet Commonly known as: ROBAXIN Take 500 mg by mouth 3 (three) times daily as needed for pain.   montelukast 10 MG tablet Commonly known as: SINGULAIR Take 10 mg by mouth daily.   Mucinex Maximum Strength 1200 MG Tb12 Generic drug: Guaifenesin Take 1,200 mg by mouth in the morning and at bedtime.   MULTIVITAMIN PO Take 1 tablet by mouth every evening.   nitroGLYCERIN 0.4 MG SL tablet Commonly known as: NITROSTAT Place 0.4 mg under the tongue every 5 (five) minutes as needed for chest pain.   ondansetron 4 MG disintegrating tablet Commonly known as: ZOFRAN-ODT Take 4 mg by mouth every 8 (eight) hours as needed for nausea or vomiting.   pantoprazole 40 MG tablet Commonly known as: PROTONIX Take 1 tablet (40 mg total) by  mouth 2 (two) times daily.   potassium chloride 10 MEQ tablet Commonly known as: KLOR-CON Take 10 mEq by mouth daily as needed (when taking furosemide).   rOPINIRole 1 MG tablet Commonly known as: REQUIP Take 1 mg by mouth 3 (three) times daily.  Symbicort 160-4.5 MCG/ACT inhaler Generic drug: budesonide-formoterol Inhale 2 puffs into the lungs 2 (two) times daily.   tamsulosin 0.4 MG Caps capsule Commonly known as: FLOMAX Take 0.4 mg by mouth daily after supper.   vitamin B-12 1000 MCG tablet Commonly known as: CYANOCOBALAMIN Take 1,000 mcg by mouth daily.       Allergies  Allergen Reactions   Other Nausea And Vomiting and Other (See Comments)    pneumonia vaccine- chills, vomiting, fever, lost use of legs and body function. Had a fall post inj. (05/25/2013)   Pneumococcal Polysaccharide Vaccine Nausea And Vomiting and Other (See Comments)    Chills, loss of bodily functions, ended up in the ED Other reaction(s): GI Upset (intolerance) Chills, loss of bodily functions, ended up in the ED   Hydrocodone-Acetaminophen Other (See Comments)    Must have Zofran to tolerate this Must have Zofran to tolerate this   Ibuprofen Other (See Comments)    Heart doctor advised he cannot take this   Oxycodone Other (See Comments)    Mental status changes and causes sleepwalking Mental status changes Mental status changes and causes sleepwalking      Procedures/Studies: MR Brain W and Wo Contrast  Result Date: 08/17/2020 CLINICAL DATA:  Optic nerve edema EXAM: MRI HEAD AND ORBITS WITHOUT AND WITH CONTRAST TECHNIQUE: Multiplanar, multiecho pulse sequences of the brain and surrounding structures were obtained without and with intravenous contrast. Multiplanar, multiecho pulse sequences of the orbits and surrounding structures were obtained including fat saturation techniques, before and after intravenous contrast administration. CONTRAST:  7.19mL GADAVIST GADOBUTROL 1 MMOL/ML IV SOLN  COMPARISON:  09/28/2017 FINDINGS: MRI HEAD FINDINGS Brain: Small focus of abnormal diffusion restriction in the left cerebellum. Chronic microhemorrhage in left cerebellum. Hemosiderin deposition within the right cerebellum Confluent hyperintense T2-weighted white matter signal. Diffuse, severe atrophy. Multiple cerebellar infarcts. There is no abnormal contrast enhancement. Vascular: Major flow voids are preserved. Skull and upper cervical spine: Normal calvarium and skull base. Visualized upper cervical spine and soft tissues are normal. Sinuses/Orbits:No paranasal sinus fluid levels or advanced mucosal thickening. No mastoid or middle ear effusion. Normal orbits. MRI ORBITS FINDINGS Orbits: --Globes: Normal. --Bony orbit: Normal. --Preseptal soft tissues: Normal. --Intra- and extraconal orbital fat: Normal. No inflammatory stranding. --Optic nerves: Normal. --Lacrimal glands and fossae: Normal. --Extraocular muscles: Normal. Visualized sinuses:  No fluid levels or advanced mucosal thickening. Soft tissues: Normal. Limited intracranial: Normal. IMPRESSION: 1. Small acute infarct of the left cerebellum. No hemorrhage or mass effect. 2. Normal MRI of the orbits. Electronically Signed   By: Ulyses Jarred M.D.   On: 08/17/2020 00:55   MR ORBITS W WO CONTRAST  Result Date: 08/17/2020 CLINICAL DATA:  Optic nerve edema EXAM: MRI HEAD AND ORBITS WITHOUT AND WITH CONTRAST TECHNIQUE: Multiplanar, multiecho pulse sequences of the brain and surrounding structures were obtained without and with intravenous contrast. Multiplanar, multiecho pulse sequences of the orbits and surrounding structures were obtained including fat saturation techniques, before and after intravenous contrast administration. CONTRAST:  7.39mL GADAVIST GADOBUTROL 1 MMOL/ML IV SOLN COMPARISON:  09/28/2017 FINDINGS: MRI HEAD FINDINGS Brain: Small focus of abnormal diffusion restriction in the left cerebellum. Chronic microhemorrhage in left cerebellum.  Hemosiderin deposition within the right cerebellum Confluent hyperintense T2-weighted white matter signal. Diffuse, severe atrophy. Multiple cerebellar infarcts. There is no abnormal contrast enhancement. Vascular: Major flow voids are preserved. Skull and upper cervical spine: Normal calvarium and skull base. Visualized upper cervical spine and soft tissues are normal. Sinuses/Orbits:No paranasal sinus fluid  levels or advanced mucosal thickening. No mastoid or middle ear effusion. Normal orbits. MRI ORBITS FINDINGS Orbits: --Globes: Normal. --Bony orbit: Normal. --Preseptal soft tissues: Normal. --Intra- and extraconal orbital fat: Normal. No inflammatory stranding. --Optic nerves: Normal. --Lacrimal glands and fossae: Normal. --Extraocular muscles: Normal. Visualized sinuses:  No fluid levels or advanced mucosal thickening. Soft tissues: Normal. Limited intracranial: Normal. IMPRESSION: 1. Small acute infarct of the left cerebellum. No hemorrhage or mass effect. 2. Normal MRI of the orbits. Electronically Signed   By: Ulyses Jarred M.D.   On: 08/17/2020 00:55      Subjective: Patient seen this morning in the ED.  Reported feeling fine.  Denied visual symptoms.  Suspect poor historian.  No other complaints reported.  Discharge Exam:  Vitals:   08/17/20 0930 08/17/20 0945 08/17/20 1000 08/17/20 1118  BP: (!) 113/56 120/79 (!) 105/51 133/68  Pulse: (!) 58 (!) 57 (!) 53 (!) 58  Resp: 18 17 20 17   Temp:      TempSrc:      SpO2: 92% 92% 92% 90%    General: Pleasant elderly male, moderately built and frail, lying comfortably supine in bed without distress. Cardiovascular: S1 & S2 heard, RRR, S1/S2 +. No murmurs, rubs, gallops or clicks. No JVD or pedal edema.  Telemetry personally reviewed: SB in the 50s-SR in the 60s Respiratory: Clear to auscultation without wheezing, rhonchi or crackles. No increased work of breathing. Abdominal:  Non distended, non tender & soft. No organomegaly or masses  appreciated. Normal bowel sounds heard. CNS: Alert and oriented x2. No focal deficits. Extremities: no edema, no cyanosis.  Upper extremities with some bruising.  Right forearm with a dressing-did not remove to examine    The results of significant diagnostics from this hospitalization (including imaging, microbiology, ancillary and laboratory) are listed below for reference.     Microbiology: Recent Results (from the past 240 hour(s))  SARS CORONAVIRUS 2 (TAT 6-24 HRS) Nasopharyngeal Nasopharyngeal Swab     Status: None   Collection Time: 08/17/20  1:39 AM   Specimen: Nasopharyngeal Swab  Result Value Ref Range Status   SARS Coronavirus 2 NEGATIVE NEGATIVE Final    Comment: (NOTE) SARS-CoV-2 target nucleic acids are NOT DETECTED.  The SARS-CoV-2 RNA is generally detectable in upper and lower respiratory specimens during the acute phase of infection. Negative results do not preclude SARS-CoV-2 infection, do not rule out co-infections with other pathogens, and should not be used as the sole basis for treatment or other patient management decisions. Negative results must be combined with clinical observations, patient history, and epidemiological information. The expected result is Negative.  Fact Sheet for Patients: SugarRoll.be  Fact Sheet for Healthcare Providers: https://www.woods-mathews.com/  This test is not yet approved or cleared by the [place] FDA and  has been authorized for detection and/or diagnosis of SARS-CoV-2 by FDA under an Emergency Use Authorization (EUA). This EUA will remain  in effect (meaning this test can be used) for the duration of the COVID-19 declaration under Se ction 564(b)(1) of the Act, 21 U.S.C. section 360bbb-3(b)(1), unless the authorization is terminated or revoked sooner.  Performed at [facility], [place] [address], [place], [place] [address]      Labs: CBC: Recent Labs  Lab  08/16/20 1815 08/17/20 0500  WBC 8.7 9.9  NEUTROABS 6.3  --   HGB 14.3 15.2  HCT 42.4 44.0  MCV 91.2 89.8  PLT 181 128    Basic Metabolic Panel: Recent Labs  Lab  08/16/20 1815 08/17/20 0500  NA 139 132*  K 4.0 4.3  CL 103 96*  CO2 25 27  GLUCOSE 148* 85  BUN 10 10  CREATININE 1.42* 1.24  CALCIUM 8.9 8.7*    Liver Function Tests: Recent Labs  Lab 08/16/20 1815  AST 30  ALT 19  ALKPHOS 87  BILITOT 0.7  PROT 6.5  ALBUMIN 3.3*     Lipid Profile Recent Labs    08/17/20 0500  CHOL 177  HDL 44  LDLCALC 104*  TRIG 145  CHOLHDL 4.0    Thyroid function studies Recent Labs    08/17/20 0556  TSH 4.192   I discussed in detail with 1 of patient's daughter via phone, updated care and answered all questions.  Time coordinating discharge: 25 minutes  SIGNED:  Vernell Leep, MD, [place], [facility]. [facility]  To contact the attending provider between 7A-7P or the covering provider during after hours 7P-7A, please log into the web site [email or web address] and access using universal [place] password for that web site. If you do not have the password, please call the hospital operator.

## 2020-08-18 LAB — HEMOGLOBIN A1C
Hgb A1c MFr Bld: 5.6 % (ref 4.8–5.6)
Mean Plasma Glucose: 114 mg/dL

## 2020-08-21 ENCOUNTER — Encounter (HOSPITAL_COMMUNITY): Payer: Self-pay

## 2020-08-21 ENCOUNTER — Other Ambulatory Visit: Payer: Self-pay

## 2020-08-21 ENCOUNTER — Other Ambulatory Visit: Payer: Self-pay | Admitting: Neurology

## 2020-08-21 ENCOUNTER — Ambulatory Visit (HOSPITAL_COMMUNITY)
Admission: RE | Admit: 2020-08-21 | Discharge: 2020-08-21 | Disposition: A | Discharge status: Home / Self Care | Payer: PPO | Source: Ambulatory Visit | Attending: Neurology | Admitting: Neurology

## 2020-08-21 ENCOUNTER — Telehealth: Payer: Self-pay | Admitting: Cardiology

## 2020-08-21 DIAGNOSIS — H4710 Unspecified papilledema: Secondary | ICD-10-CM | POA: Diagnosis not present

## 2020-08-21 LAB — CSF CELL COUNT WITH DIFFERENTIAL
Eosinophils, CSF: 1 % (ref 0–1)
Lymphs, CSF: 29 % — ABNORMAL LOW (ref 40–80)
Monocyte-Macrophage-Spinal Fluid: 0 % — ABNORMAL LOW (ref 15–45)
Other Cells, CSF: 1
RBC Count, CSF: 12000 /mm3 — ABNORMAL HIGH
RBC Count, CSF: 1560 /mm3 — ABNORMAL HIGH
Segmented Neutrophils-CSF: 69 % — ABNORMAL HIGH (ref 0–6)
Tube #: 1
Tube #: 4
WBC, CSF: 12 /mm3 (ref 0–5)
WBC, CSF: 2 /mm3 (ref 0–5)

## 2020-08-21 LAB — PROTEIN, CSF: Total  Protein, CSF: 87 mg/dL — ABNORMAL HIGH (ref 15–45)

## 2020-08-21 LAB — GLUCOSE, CSF: Glucose, CSF: 58 mg/dL (ref 40–70)

## 2020-08-21 MED ORDER — LIDOCAINE HCL (PF) 1 % IJ SOLN
5.0000 mL | Freq: Once | INTRAMUSCULAR | Status: AC
  Administered 2020-08-21: 3 mL via INTRADERMAL

## 2020-08-21 NOTE — Progress Notes (Signed)
Pt ambulated without difficulty or bleeding.   Discharged home with his son who will drive and his wife, Mechele Claude, who will stay with pt x 24 hrs.

## 2020-08-21 NOTE — Telephone Encounter (Signed)
Wife of the patient called. Patient had a stroke over the weekend and was told to follow up with DR. Revankar and Dr. Curt Bears within the next week. No regular appointments were available. Wife wanted to know if there was a way to get the patient worked in.  Wife also wanted to know if the Husband needs to keep his appt to have a CT done on 08/28/20.  Please advise

## 2020-08-21 NOTE — Progress Notes (Signed)
Radiology called with message from lab that "white count was 12". Lab result relayed to Curly [place], MD.

## 2020-08-21 NOTE — Discharge Instructions (Signed)
May resume all medications as normal. Okay to take Eliquis tonight after 7:00 PM

## 2020-08-22 LAB — VDRL, CSF: VDRL Quant, CSF: NONREACTIVE

## 2020-08-22 LAB — PATHOLOGIST SMEAR REVIEW

## 2020-08-23 ENCOUNTER — Other Ambulatory Visit (HOSPITAL_COMMUNITY): Payer: PPO

## 2020-08-23 DIAGNOSIS — D649 Anemia, unspecified: Secondary | ICD-10-CM | POA: Diagnosis not present

## 2020-08-23 DIAGNOSIS — E538 Deficiency of other specified B group vitamins: Secondary | ICD-10-CM | POA: Diagnosis not present

## 2020-08-23 DIAGNOSIS — Z8673 Personal history of transient ischemic attack (TIA), and cerebral infarction without residual deficits: Secondary | ICD-10-CM | POA: Diagnosis not present

## 2020-08-23 DIAGNOSIS — N400 Enlarged prostate without lower urinary tract symptoms: Secondary | ICD-10-CM | POA: Diagnosis not present

## 2020-08-23 DIAGNOSIS — M818 Other osteoporosis without current pathological fracture: Secondary | ICD-10-CM | POA: Diagnosis not present

## 2020-08-23 DIAGNOSIS — I10 Essential (primary) hypertension: Secondary | ICD-10-CM | POA: Diagnosis not present

## 2020-08-23 DIAGNOSIS — I2510 Atherosclerotic heart disease of native coronary artery without angina pectoris: Secondary | ICD-10-CM | POA: Diagnosis not present

## 2020-08-23 DIAGNOSIS — Z79899 Other long term (current) drug therapy: Secondary | ICD-10-CM | POA: Diagnosis not present

## 2020-08-23 DIAGNOSIS — G2581 Restless legs syndrome: Secondary | ICD-10-CM | POA: Diagnosis not present

## 2020-08-23 DIAGNOSIS — E785 Hyperlipidemia, unspecified: Secondary | ICD-10-CM | POA: Diagnosis not present

## 2020-08-23 DIAGNOSIS — R5381 Other malaise: Secondary | ICD-10-CM | POA: Diagnosis not present

## 2020-08-23 DIAGNOSIS — H4710 Unspecified papilledema: Secondary | ICD-10-CM | POA: Diagnosis not present

## 2020-08-23 LAB — HSV 1/2 PCR, CSF
HSV-1 DNA: NEGATIVE
HSV-2 DNA: NEGATIVE

## 2020-08-24 LAB — CSF CULTURE W GRAM STAIN: Culture: NO GROWTH

## 2020-08-26 DIAGNOSIS — I471 Supraventricular tachycardia: Secondary | ICD-10-CM | POA: Diagnosis not present

## 2020-08-26 DIAGNOSIS — F0390 Unspecified dementia without behavioral disturbance: Secondary | ICD-10-CM | POA: Diagnosis not present

## 2020-08-26 DIAGNOSIS — E46 Unspecified protein-calorie malnutrition: Secondary | ICD-10-CM | POA: Diagnosis not present

## 2020-08-26 DIAGNOSIS — I69398 Other sequelae of cerebral infarction: Secondary | ICD-10-CM | POA: Diagnosis not present

## 2020-08-26 DIAGNOSIS — D631 Anemia in chronic kidney disease: Secondary | ICD-10-CM | POA: Diagnosis not present

## 2020-08-26 DIAGNOSIS — H02401 Unspecified ptosis of right eyelid: Secondary | ICD-10-CM | POA: Diagnosis not present

## 2020-08-26 DIAGNOSIS — E538 Deficiency of other specified B group vitamins: Secondary | ICD-10-CM | POA: Diagnosis not present

## 2020-08-26 DIAGNOSIS — H4710 Unspecified papilledema: Secondary | ICD-10-CM | POA: Diagnosis not present

## 2020-08-26 DIAGNOSIS — R600 Localized edema: Secondary | ICD-10-CM | POA: Diagnosis not present

## 2020-08-26 DIAGNOSIS — I2510 Atherosclerotic heart disease of native coronary artery without angina pectoris: Secondary | ICD-10-CM | POA: Diagnosis not present

## 2020-08-26 DIAGNOSIS — J449 Chronic obstructive pulmonary disease, unspecified: Secondary | ICD-10-CM | POA: Diagnosis not present

## 2020-08-26 DIAGNOSIS — I472 Ventricular tachycardia: Secondary | ICD-10-CM | POA: Diagnosis not present

## 2020-08-26 DIAGNOSIS — G603 Idiopathic progressive neuropathy: Secondary | ICD-10-CM | POA: Diagnosis not present

## 2020-08-26 DIAGNOSIS — K5900 Constipation, unspecified: Secondary | ICD-10-CM | POA: Diagnosis not present

## 2020-08-26 DIAGNOSIS — H532 Diplopia: Secondary | ICD-10-CM | POA: Diagnosis not present

## 2020-08-26 DIAGNOSIS — K449 Diaphragmatic hernia without obstruction or gangrene: Secondary | ICD-10-CM | POA: Diagnosis not present

## 2020-08-26 DIAGNOSIS — M818 Other osteoporosis without current pathological fracture: Secondary | ICD-10-CM | POA: Diagnosis not present

## 2020-08-26 DIAGNOSIS — I639 Cerebral infarction, unspecified: Secondary | ICD-10-CM | POA: Diagnosis not present

## 2020-08-26 DIAGNOSIS — G2581 Restless legs syndrome: Secondary | ICD-10-CM | POA: Diagnosis not present

## 2020-08-26 DIAGNOSIS — N1831 Chronic kidney disease, stage 3a: Secondary | ICD-10-CM | POA: Diagnosis not present

## 2020-08-26 DIAGNOSIS — N400 Enlarged prostate without lower urinary tract symptoms: Secondary | ICD-10-CM | POA: Diagnosis not present

## 2020-08-26 DIAGNOSIS — N200 Calculus of kidney: Secondary | ICD-10-CM | POA: Diagnosis not present

## 2020-08-26 DIAGNOSIS — M5126 Other intervertebral disc displacement, lumbar region: Secondary | ICD-10-CM | POA: Diagnosis not present

## 2020-08-26 DIAGNOSIS — B353 Tinea pedis: Secondary | ICD-10-CM | POA: Diagnosis not present

## 2020-08-26 DIAGNOSIS — E785 Hyperlipidemia, unspecified: Secondary | ICD-10-CM | POA: Diagnosis not present

## 2020-08-26 DIAGNOSIS — I129 Hypertensive chronic kidney disease with stage 1 through stage 4 chronic kidney disease, or unspecified chronic kidney disease: Secondary | ICD-10-CM | POA: Diagnosis not present

## 2020-09-03 ENCOUNTER — Telehealth: Payer: Self-pay | Admitting: Neurology

## 2020-09-03 ENCOUNTER — Other Ambulatory Visit: Payer: Self-pay

## 2020-09-03 NOTE — Telephone Encounter (Signed)
Blood work done on 23 August 2020 reveals a CBC with a white blood count of 8.6, hemoglobin of 14.9, hematocrit 43.7, MCV of 90, platelets of 198.  Chemistry panel is notable for glucose of 90, BUN of 10, creatinine 1.33, estimated GFR of 53.  Sodium 140, potassium 3.9, chloride 103, CO2 25, calcium 9.1, total protein 6.4, albumin 3.7, liver profile is unremarkable.

## 2020-09-04 ENCOUNTER — Encounter: Payer: Self-pay | Admitting: Neurology

## 2020-09-04 ENCOUNTER — Ambulatory Visit: Payer: PPO | Admitting: Neurology

## 2020-09-04 VITALS — BP 120/66 | HR 57 | Ht 65.0 in | Wt 165.0 lb

## 2020-09-04 DIAGNOSIS — R413 Other amnesia: Secondary | ICD-10-CM | POA: Diagnosis not present

## 2020-09-04 DIAGNOSIS — R269 Unspecified abnormalities of gait and mobility: Secondary | ICD-10-CM | POA: Diagnosis not present

## 2020-09-04 DIAGNOSIS — H469 Unspecified optic neuritis: Secondary | ICD-10-CM | POA: Diagnosis not present

## 2020-09-04 MED ORDER — MEMANTINE HCL 10 MG PO TABS: 10.0000 mg | ORAL_TABLET | Freq: Two times a day (BID) | ORAL | 3 refills | Status: DC

## 2020-09-04 NOTE — Patient Instructions (Signed)
With the namenda 5 mg tablets, begin one tablet in the morning and 2 in the evening for 1 week, then take 2 twice a day. When the 5 mg tablets are used up, convert to the 10 mg tablets taking one twice a day.

## 2020-09-04 NOTE — Progress Notes (Signed)
Reason for visit: Stroke, dementia, gait disorder, papilledema  Chris Braun is an 85 y.o. male  History of present illness:  Chris Braun is an 85 year old right-handed white male with a history of vascular dementia.  The patient has extensive white matter changes in the brain and cortical atrophy.  He has a chronic gait disorder and memory disorder as a result of this.  He has a history of atrial fibrillation, he is on anticoagulation and taking low-dose aspirin.  The patient is followed through ophthalmology, they have noted evolution of bilateral papilledema.  Blood work has been done with a minimally elevated C-reactive protein of 1.2, sedimentation rate was 12.  He has been in the hospital on 16 August 2020 for a work-up of the papilledema.  The patient underwent MRI of the brain that shows a small acute left cerebellar stroke, generalized cortical atrophy was seen.  MRI of the orbits was normal.  The patient was kept on anticoagulant therapy and aspirin.  He eventually underwent a lumbar puncture on 21 August 2020 with an opening pressure of 9 cm.  The spinal tap was traumatic with 12,000 white blood cells, the protein was 87 correcting to 70 for the elevation in red blood cells but the protein is still slightly elevated.  The glucose was normal at 58.  No evidence of an infection was noted.  The patient has had some recent decline in his ability to ambulate, he is now getting physical therapy.  He has not had any falls since December 2021.  The wife indicates that he is having some occasional hallucinations but this is not agitating him.  He is eating fairly well.  He is waking up in the middle the night and having trouble getting back to sleep.  The patient will walk with a walker.  He has some trouble elevating the right arm due to a right shoulder issue.  He has a history of restless leg syndrome as well.  The optic edema apparently was worse on the left than the right.  He is sent here for  further evaluation.  The patient has apparently had some decline in vision, he reports no headaches, numbness or new weakness.  He denies any dizziness.  The wife believes that the use of Namenda has made him more alert and cognitively focused.  He is not on a full dose of the Namenda, he only takes 5 mg twice daily.  Past Medical History:  Diagnosis Date   Acute hypoxemic respiratory failure ([place]) 05/27/2018   Acute ischemic stroke ([facility]) 08/17/2020   AF (paroxysmal atrial fibrillation) ([place]) 08/17/2020   AKI (acute kidney injury) ([place]) 05/27/2018   Aspiration into airway    Asthma    Atrial tachycardia ([place]) 07/26/2018   B12 deficiency    Bilateral optic neuropathy    CAD (coronary artery disease) 08/24/2014   Carotid stenosis, bilateral 08/24/2014   Cataracts, bilateral    Compression fracture of L2 lumbar vertebra, closed, initial encounter ([place]) 10/27/2019   COPD (chronic obstructive pulmonary disease) ([place])    Depression    Diverticulosis    per colonscopy   Dyslipidemia 08/24/2014   Falls frequently 06/03/2015   Food impaction of esophagus    Gait abnormality 01/19/2018   GERD (gastroesophageal reflux disease)    Hiatal hernia    High cholesterol    Hypertension    Hypoxia    Idiopathic progressive polyneuropathy    Kidney stones    Lumbar vertebral fracture, pathologic 10/27/2019  Macular degeneration    Memory disorder 01/19/2018   Myocardial infarction [facility]) 2002   Oculomotor nerve palsy, 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   Osteoporosis    Renal insufficiency 05/27/2018   Right kidney stone 05/27/2018   Right upper lobe pneumonia 05/27/2018   Risk for falls 05/28/2015   RLS (restless legs syndrome) 04/05/2020   Status post ablation of ventricular arrhythmia 04/27/2018   Stroke (cerebrum) ([place]) 01/17/2014   Stroke ([place])    SVT (supraventricular tachycardia) ([place]) 12/09/2019   Weakness of distal arms and legs 06/03/2015    Past Surgical History:  Procedure Laterality Date   CARDIAC CATHETERIZATION  2002,    stents    CATARACT EXTRACTION Left 2016   Colonscopy  2012   CYSTOSCOPY W/ URETERAL STENT PLACEMENT Right 05/30/2018   Procedure: CYSTOSCOPY WITH RETROGRADE PYELOGRAM/URETERAL STENT PLACEMENT;  Surgeon: Irine Seal, MD;  Location: [place];  Service: Urology;  Laterality: Right;   ESOPHAGOGASTRODUODENOSCOPY (EGD) WITH PROPOFOL N/A 02/10/2020   Procedure: ESOPHAGOGASTRODUODENOSCOPY (EGD) WITH PROPOFOL;  Surgeon: Yetta Flock, MD;  Location: [place];  Service: Gastroenterology;  Laterality: N/A;   FOREIGN BODY REMOVAL  02/10/2020   Procedure: FOREIGN BODY REMOVAL;  Surgeon: Yetta Flock, MD;  Location: [facility] ENDOSCOPY;  Service: Gastroenterology;;   HIP FRACTURE SURGERY  2008   right   HYDROCELE EXCISION  10/12/2014   LITHOTRIPSY     2007, 2009, 2010, 2011, 2012, 2013   LUMBAR LAMINECTOMY/DECOMPRESSION MICRODISCECTOMY  03/15/2012   Procedure: LUMBAR LAMINECTOMY/DECOMPRESSION MICRODISCECTOMY 1 LEVEL;  Surgeon: Elaina Hoops, MD;  Location: [place] NEURO ORS;  Service: Neurosurgery;  Laterality: Left;  Left lumbar three-four decompressive lumbar laminectomy, discectomy   MAXIMUM ACCESS (MAS)POSTERIOR LUMBAR INTERBODY FUSION (PLIF) 2 LEVEL N/A 12/08/2012   Procedure: FOR MAXIMUM ACCESS (MAS) POSTERIOR LUMBAR INTERBODY FUSION (PLIF) 2 LEVEL;  Surgeon: Elaina Hoops, MD;  Location: [place] NEURO ORS;  Service: Neurosurgery;  Laterality: N/A;  FOR MAXIMUM ACCESS (MAS) POSTERIOR LUMBAR INTERBODY FUSION (PLIF) 2 LEVEL   ROTATOR CUFF REPAIR  1999   bil    SALIVARY STONE REMOVAL  1964   1964   TOTAL HIP ARTHROPLASTY Right 2008   VT study     with Ablation    Family History  Problem Relation Age of Onset   Hemolytic uremic syndrome Mother    Heart disease Mother    Stroke Father    Heart disease Father    Heart disease Sister    Heart disease Sister    Heart disease Sister    Alzheimer's disease Sister     Social history:  reports that he quit smoking about 19 years ago. His smoking use  included cigarettes. He has a 45.00 pack-year smoking history. His smokeless tobacco use includes chew. He reports that he does not drink alcohol and does not use drugs.    Allergies  Allergen Reactions   Other Nausea And Vomiting and Other (See Comments)    pneumonia vaccine- chills, vomiting, fever, lost use of legs and body function. Had a fall post inj. (05/25/2013)   Pneumococcal Polysaccharide Vaccine Nausea And Vomiting and Other (See Comments)    Chills, loss of bodily functions, ended up in the ED Other reaction(s): GI Upset (intolerance) Chills, loss of bodily functions, ended up in the ED   Hydrocodone-Acetaminophen Other (See Comments)    Must have Zofran to tolerate this Must have Zofran to tolerate this   Ibuprofen Other (See Comments)    Heart doctor advised he cannot take this  Oxycodone Other (See Comments)    Mental status changes and causes sleepwalking Mental status changes Mental status changes and causes sleepwalking    Medications:  Prior to Admission medications   Medication Sig Start Date End Date Taking? Authorizing Provider  acetaminophen (TYLENOL) 650 MG CR tablet Take 1,300 mg by mouth every 8 (eight) hours as needed for pain.   Yes [provider]  amiodarone (PACERONE) 200 MG tablet Take 100 mg by mouth daily.   Yes [provider]  apixaban (ELIQUIS) 5 MG TABS tablet Take 1 tablet (5 mg total) by mouth 2 (two) times daily. Holding for spinal tap on 08/21/2020.  May resume after 8 hours after the spinal tap. 08/21/20  Yes Hongalgi, Lenis Dickinson, MD  aspirin EC 81 MG tablet Take 1 tablet (81 mg total) by mouth at bedtime. Holding for spinal tap on 08/21/2020.  May resume after 8 hours after the spinal tap. 08/21/20  Yes Hongalgi, Lenis Dickinson, MD  atorvastatin (LIPITOR) 80 MG tablet Take 80 mg by mouth at bedtime.   Yes [provider]  benzonatate (TESSALON) 200 MG capsule Take 200 mg by mouth 3 (three) times daily as needed for cough. 08/06/20  Yes  [provider]  calcitonin, salmon, (MIACALCIN/FORTICAL) 200 UNIT/ACT nasal spray Place 1 spray into alternate nostrils at bedtime.   Yes [provider]  Calcium Carb-Cholecalciferol (CALCIUM-VITAMIN D3) 600-200 MG-UNIT TABS Take 600 mg by mouth 2 (two) times daily.   Yes [provider]  digoxin (LANOXIN) 0.125 MG tablet Take 1 tablet by mouth every other day.   Yes [provider]  docusate sodium (COLACE) 100 MG capsule Take 100 mg by mouth at bedtime.   Yes [provider]  donepezil (ARICEPT) 5 MG tablet Take 5 mg by mouth at bedtime.  11/05/15  Yes [provider]  Ferrous Gluconate (IRON 27 PO) Take 27 mg by mouth every evening.   Yes [provider]  furosemide (LASIX) 20 MG tablet Take 20 mg by mouth daily as needed for fluid.   Yes [provider]  Guaifenesin (MUCINEX MAXIMUM STRENGTH) 1200 MG TB12 Take 1,200 mg by mouth in the morning and at bedtime.   Yes [provider]  ipratropium-albuterol (DUONEB) 0.5-2.5 (3) MG/3ML SOLN Take 3 mLs by nebulization 2 (two) times daily.    Yes [provider]  ketoconazole (NIZORAL) 2 % cream Apply 1 application topically daily as needed for irritation (on feet). Apply as needed to affected area.   04/25/19  Yes [provider]  lactulose (CHRONULAC) 10 GM/15ML solution Take 10 g by mouth at bedtime as needed for mild constipation.    Yes [provider]  loperamide (IMODIUM) 2 MG capsule Take 2-4 mg by mouth as needed for diarrhea or loose stools.   Yes [provider]  loratadine (CLARITIN) 10 MG tablet Take 10 mg by mouth in the morning.    Yes [provider]  Lutein 20 MG TABS Take 20 mg by mouth daily.   Yes [provider]  memantine (NAMENDA) 5 MG tablet Take 5 mg by mouth 2 (two) times daily.   Yes [provider]  methocarbamol (ROBAXIN) 500 MG tablet Take 500 mg by mouth 3 (three) times daily as  needed for pain. 11/23/19  Yes [provider]  montelukast (SINGULAIR) 10 MG tablet Take 10 mg by mouth daily.    Yes [provider]  Multiple Vitamins-Minerals (MULTIVITAMIN PO) Take 1 tablet by mouth  every evening.    Yes [provider]  nitroGLYCERIN (NITROSTAT) 0.4 MG SL tablet Place 0.4 mg under the tongue every 5 (five) minutes as needed for chest pain.   Yes [provider]  ondansetron (ZOFRAN-ODT) 4 MG disintegrating tablet Take 4 mg by mouth every 8 (eight) hours as needed for nausea or vomiting.  12/07/19  Yes [provider]  pantoprazole (PROTONIX) 40 MG tablet Take 1 tablet (40 mg total) by mouth 2 (two) times daily. 02/16/20  Yes Swayze, Ava, DO  potassium chloride (KLOR-CON) 10 MEQ tablet Take 10 mEq by mouth daily as needed for other (when taking furosemide). When taking Lasix   Yes [provider]  rOPINIRole (REQUIP) 1 MG tablet Take 1 mg by mouth 3 (three) times daily.   Yes [provider]  SYMBICORT 160-4.5 MCG/ACT inhaler Inhale 2 puffs into the lungs 2 (two) times daily. 07/21/16  Yes [provider]  tamsulosin (FLOMAX) 0.4 MG CAPS capsule Take 0.4 mg by mouth daily after supper.   Yes [provider]  vitamin B-12 (CYANOCOBALAMIN) 1000 MCG tablet Take 1,000 mcg by mouth daily.   Yes [provider]  sucralfate (CARAFATE) 1 GM/10ML suspension Take 10 mLs (1 g total) by mouth 4 (four) times daily -  with meals and at bedtime. Patient not taking: Reported on 08/17/2020 02/16/20 08/17/20  Swayze, Ava, DO    ROS:  Out of a complete 14 system review of symptoms, the patient complains only of the following symptoms, and all other reviewed systems are negative.  Walking difficulty Memory disturbance Hallucinations Vision change  Blood pressure 120/66, pulse (!) 57, height 5\' 5"  (1.651 m), weight 165 lb (74.8 kg).  Physical Exam  General: The patient is alert and cooperative at the time  of the examination.  Skin: No significant peripheral edema is noted.   Neurologic Exam  Mental status: The patient is alert and oriented x 2 at the time of the examination, the patient is oriented to person and place.    Cranial nerves: Facial symmetry is present. Speech is normal, no aphasia or dysarthria is noted. Extraocular movements are full. Visual fields are full.  Motor: The patient has good strength in all 4 extremities.  The patient has some difficulty elevating the right arm, decreased right shoulder mobility.  Sensory examination: Soft touch sensation is symmetric on the face, arms, and legs.  Coordination: The patient has good finger-nose-finger and heel-to-shin bilaterally.  No significant apraxia was noted.  Gait and station: The patient has the ability to walk with examiner, gait is somewhat wide-based.  He has shuffles with turns.  Romberg is negative.  Reflexes: Deep tendon reflexes are symmetric.   MRI brain 08/16/20:  IMPRESSION: 1. Small acute infarct of the left cerebellum. No hemorrhage or mass effect. 2. Normal MRI of the orbits.  * MRI scan images were reviewed online. I agree with the written report.    Assessment/Plan:  1.  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  Vascular dementia  3.  Recent small left cerebellar stroke  4.  Chronic gait disorder  5.  Hallucinations  The hallucinations at this point are occasional, and are not causing problems for the patient, we will not treat.  We will go up on the [place] working up to the maintenance dose of 10 mg twice daily.  A prescription was sent in.  The work-up that was done previously does not show evidence of elevated  intracranial pressure or evidence of an infection.  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  The patient will remain on anticoagulation therapy and aspirin, he will  follow-up here in 6 months, in the future he can be followed through Dr. Leonie Man.  Jill Alexanders MD 09/04/2020 7:29 AM  [facility] [address] [place], [place] [address]  Phone [phone] Fax [phone]

## 2020-09-05 ENCOUNTER — Encounter: Payer: Self-pay | Admitting: Cardiology

## 2020-09-05 ENCOUNTER — Ambulatory Visit: Payer: PPO | Admitting: Cardiology

## 2020-09-05 ENCOUNTER — Other Ambulatory Visit: Payer: Self-pay

## 2020-09-05 VITALS — BP 122/60 | HR 71 | Ht 65.0 in | Wt 164.8 lb

## 2020-09-05 DIAGNOSIS — I2510 Atherosclerotic heart disease of native coronary artery without angina pectoris: Secondary | ICD-10-CM | POA: Diagnosis not present

## 2020-09-05 DIAGNOSIS — R911 Solitary pulmonary nodule: Secondary | ICD-10-CM | POA: Diagnosis not present

## 2020-09-05 DIAGNOSIS — J439 Emphysema, unspecified: Secondary | ICD-10-CM | POA: Diagnosis not present

## 2020-09-05 DIAGNOSIS — E785 Hyperlipidemia, unspecified: Secondary | ICD-10-CM | POA: Diagnosis not present

## 2020-09-05 DIAGNOSIS — I480 Paroxysmal atrial fibrillation: Secondary | ICD-10-CM | POA: Diagnosis not present

## 2020-09-05 DIAGNOSIS — R918 Other nonspecific abnormal finding of lung field: Secondary | ICD-10-CM | POA: Diagnosis not present

## 2020-09-05 NOTE — Patient Instructions (Signed)

## 2020-09-05 NOTE — Progress Notes (Signed)
Cardiology Office Note:    Date:  09/05/2020   ID:  Chris Braun, DOB [birth year], MRN [id number]  PCP:  Chris Coop, FNP  Cardiologist:  Chris Lindau, MD   Referring MD: Chris Coop, FNP    ASSESSMENT:    1. Coronary artery disease involving native coronary artery of native heart without angina pectoris   2. AF (paroxysmal atrial fibrillation) ([place])   3. Dyslipidemia    PLAN:    In order of problems listed above:  Coronary artery disease: Secondary prevention stressed with the patient.  Importance of compliance with diet medication stressed any vocalized understanding. Paroxysmal atrial fibrillation:I discussed with the patient atrial fibrillation, disease process. Management and therapy including rate and rhythm control, anticoagulation benefits and potential risks were discussed extensively with the patient. Patient had multiple questions which were answered to patient's satisfaction. Mixed dyslipidemia: On statin therapy.  Lipids were reviewed. History of multiple strokes: Managed by neurology. Patient will be seen in follow-up appointment in 6 months or earlier if the patient has any concerns    Medication Adjustments/Labs and Tests Ordered: Current medicines are reviewed at length with the patient today.  Concerns regarding medicines are outlined above.  Orders Placed This Encounter  Procedures   EKG 12-Lead   No orders of the defined types were placed in this encounter.    No chief complaint on file.    History of Present Illness:    Chris Braun is a 85 y.o. male.  Patient has past medical history of paroxysmal atrial fibrillation, multiple strokes, essential hypertension and dyslipidemia.  He denies any problems at this time and takes care of activities of daily living.  No chest pain orthopnea or PND.  At the time of my evaluation, the patient is alert awake oriented and in no distress.  Past Medical History:  Diagnosis Date   Acute hypoxemic  respiratory failure ([place]) 05/27/2018   Acute ischemic stroke ([facility]) 08/17/2020   AF (paroxysmal atrial fibrillation) ([place]) 08/17/2020   AKI (acute kidney injury) ([place]) 05/27/2018   Aspiration into airway    Asthma    Atrial tachycardia ([place]) 07/26/2018   B12 deficiency    Bilateral optic neuropathy    CAD (coronary artery disease) 08/24/2014   Carotid stenosis, bilateral 08/24/2014   Cataracts, bilateral    Compression fracture of L2 lumbar vertebra, closed, initial encounter ([place]) 10/27/2019   COPD (chronic obstructive pulmonary disease) ([place])    Depression    Diverticulosis    per colonscopy   Dyslipidemia 08/24/2014   Falls frequently 06/03/2015   Food impaction of esophagus    Gait abnormality 01/19/2018   GERD (gastroesophageal reflux disease)    Hiatal hernia    High cholesterol    Hypertension    Hypoxia    Idiopathic progressive polyneuropathy    Kidney stones    Lumbar vertebral fracture, pathologic 10/27/2019   Macular degeneration    Memory disorder 01/19/2018   Myocardial infarction [facility]) 2002   Oculomotor nerve palsy, 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   Osteoporosis    Renal insufficiency 05/27/2018   Right kidney stone 05/27/2018   Right upper lobe pneumonia 05/27/2018   Risk for falls 05/28/2015   RLS (restless legs syndrome) 04/05/2020   Status post ablation of ventricular arrhythmia 04/27/2018   Stroke (cerebrum) ([place]) 01/17/2014   Stroke ([place])    SVT (supraventricular tachycardia) ([place]) 12/09/2019   Weakness of distal arms and legs 06/03/2015    Past Surgical History:  Procedure Laterality  Date   CARDIAC CATHETERIZATION  2002,   stents    CATARACT EXTRACTION Left 2016   Colonscopy  2012   CYSTOSCOPY W/ URETERAL STENT PLACEMENT Right 05/30/2018   Procedure: CYSTOSCOPY WITH RETROGRADE PYELOGRAM/URETERAL STENT PLACEMENT;  Surgeon: Irine Seal, MD;  Location: [place];  Service: Urology;  Laterality: Right;   ESOPHAGOGASTRODUODENOSCOPY (EGD) WITH PROPOFOL N/A 02/10/2020   Procedure:  ESOPHAGOGASTRODUODENOSCOPY (EGD) WITH PROPOFOL;  Surgeon: Yetta Flock, MD;  Location: [place];  Service: Gastroenterology;  Laterality: N/A;   FOREIGN BODY REMOVAL  02/10/2020   Procedure: FOREIGN BODY REMOVAL;  Surgeon: Yetta Flock, MD;  Location: [facility];  Service: Gastroenterology;;   HIP FRACTURE SURGERY  2008   right   HYDROCELE EXCISION  10/12/2014   LITHOTRIPSY     2007, 2009, 2010, 2011, 2012, 2013   LUMBAR LAMINECTOMY/DECOMPRESSION MICRODISCECTOMY  03/15/2012   Procedure: LUMBAR LAMINECTOMY/DECOMPRESSION MICRODISCECTOMY 1 LEVEL;  Surgeon: Elaina Hoops, MD;  Location: [place] [facility];  Service: Neurosurgery;  Laterality: Left;  Left lumbar three-four decompressive lumbar laminectomy, discectomy   MAXIMUM ACCESS (MAS)POSTERIOR LUMBAR INTERBODY FUSION (PLIF) 2 LEVEL N/A 12/08/2012   Procedure: FOR MAXIMUM ACCESS (MAS) POSTERIOR LUMBAR INTERBODY FUSION (PLIF) 2 LEVEL;  Surgeon: Elaina Hoops, MD;  Location: [place] [facility];  Service: Neurosurgery;  Laterality: N/A;  FOR MAXIMUM ACCESS (MAS) POSTERIOR LUMBAR INTERBODY FUSION (PLIF) 2 LEVEL   ROTATOR CUFF REPAIR  1999   bil    SALIVARY STONE REMOVAL  1964   1964   TOTAL HIP ARTHROPLASTY Right 2008   VT study     with Ablation    Current Medications: Current Meds  Medication Sig   acetaminophen (TYLENOL) 650 MG CR tablet Take 1,300 mg by mouth every 8 (eight) hours as needed for pain.   amiodarone (PACERONE) 200 MG tablet Take 100 mg by mouth daily.   apixaban (ELIQUIS) 5 MG TABS tablet Take 1 tablet (5 mg total) by mouth 2 (two) times daily. Holding for spinal tap on 08/21/2020.  May resume after 8 hours after the spinal tap.   aspirin EC 81 MG tablet Take 1 tablet (81 mg total) by mouth at bedtime. Holding for spinal tap on 08/21/2020.  May resume after 8 hours after the spinal tap.   atorvastatin (LIPITOR) 80 MG tablet Take 80 mg by mouth at bedtime.   benzonatate (TESSALON) 200 MG capsule Take 200 mg by mouth 3  (three) times daily as needed for cough.   calcitonin, salmon, (MIACALCIN/FORTICAL) 200 UNIT/ACT nasal spray Place 1 spray into alternate nostrils at bedtime.   Calcium Carb-Cholecalciferol (CALCIUM-VITAMIN D3) 600-200 MG-UNIT TABS Take 600 mg by mouth 2 (two) times daily.   digoxin (LANOXIN) 0.125 MG tablet Take 1 tablet by mouth every other day.   docusate sodium (COLACE) 100 MG capsule Take 100 mg by mouth at bedtime.   donepezil (ARICEPT) 5 MG tablet Take 5 mg by mouth at bedtime.    Ferrous Gluconate (IRON 27 PO) Take 27 mg by mouth every evening.   furosemide (LASIX) 20 MG tablet Take 20 mg by mouth daily as needed for fluid.   Guaifenesin (MUCINEX MAXIMUM STRENGTH) 1200 MG TB12 Take 1,200 mg by mouth in the morning and at bedtime.   ipratropium-albuterol (DUONEB) 0.5-2.5 (3) MG/3ML SOLN Take 3 mLs by nebulization 2 (two) times daily.    ketoconazole (NIZORAL) 2 % cream Apply 1 application topically daily as needed for irritation (on feet). Apply as needed to affected area.  lactulose (CHRONULAC) 10 GM/15ML solution Take 10 g by mouth at bedtime as needed for mild constipation.    loperamide (IMODIUM) 2 MG capsule Take 2-4 mg by mouth as needed for diarrhea or loose stools.   loratadine (CLARITIN) 10 MG tablet Take 10 mg by mouth in the morning.    Lutein 20 MG TABS Take 20 mg by mouth daily.   memantine (NAMENDA) 10 MG tablet Take 1 tablet (10 mg total) by mouth 2 (two) times daily.   methocarbamol (ROBAXIN) 500 MG tablet Take 500 mg by mouth 3 (three) times daily as needed for pain.   montelukast (SINGULAIR) 10 MG tablet Take 10 mg by mouth daily.    Multiple Vitamins-Minerals (MULTIVITAMIN PO) Take 1 tablet by mouth every evening.    nitroGLYCERIN (NITROSTAT) 0.4 MG SL tablet Place 0.4 mg under the tongue every 5 (five) minutes as needed for chest pain.   ondansetron (ZOFRAN-ODT) 4 MG disintegrating tablet Take 4 mg by mouth every 8 (eight) hours as needed for nausea or vomiting.     pantoprazole (PROTONIX) 40 MG tablet Take 1 tablet (40 mg total) by mouth 2 (two) times daily.   potassium chloride (KLOR-CON) 10 MEQ tablet Take 10 mEq by mouth daily as needed for other (when taking furosemide). When taking Lasix   rOPINIRole (REQUIP) 1 MG tablet Take 1 mg by mouth 3 (three) times daily.   SYMBICORT 160-4.5 MCG/ACT inhaler Inhale 2 puffs into the lungs 2 (two) times daily.   tamsulosin (FLOMAX) 0.4 MG CAPS capsule Take 0.4 mg by mouth daily after supper.   vitamin B-12 (CYANOCOBALAMIN) 1000 MCG tablet Take 1,000 mcg by mouth daily.     Allergies:   Other, Pneumococcal polysaccharide vaccine, Hydrocodone-acetaminophen, Ibuprofen, and Oxycodone   Social History   Socioeconomic History   Marital status: Married    Spouse name: Phelan Schadt   Number of children: 4   Years of education: 12   Highest education level: Not on file  Occupational History   Occupation: Retired  Tobacco Use   Smoking status: Former    Packs/day: 1.00    Years: 45.00    Pack years: 45.00    Types: Cigarettes    Quit date: 02/02/2001    Years since quitting: 19.6   Smokeless tobacco: Current    Types: Chew  Vaping Use   Vaping Use: Never used  Substance and Sexual Activity   Alcohol use: No   Drug use: No   Sexual activity: Not on file  Other Topics Concern   Not on file  Social History Narrative   Lives with wife   Caffeine use: coffee, tea   Social Determinants of Health   Financial Resource Strain: Not on file  Food Insecurity: Not on file  Transportation Needs: Not on file  Physical Activity: Not on file  Stress: Not on file  Social Connections: Not on file     Family History: The patient's family history includes Alzheimer's disease in his sister; Heart disease in his father, mother, sister, sister, and sister; Hemolytic uremic syndrome in his mother; Stroke in his father.  ROS:   Please see the history of present illness.    All other systems reviewed and are  negative.  EKGs/Labs/Other Studies Reviewed:    The following studies were reviewed today: I discussed my findings with the patient at length.  EKG reveals sinus rhythm and nonspecific ST-T changes.   Recent Labs: 02/16/2020: Magnesium 1.5 06/08/2020: NT-Pro BNP 487 08/16/2020: ALT 19  08/17/2020: BUN 10; Creatinine, Ser 1.24; Hemoglobin 15.2; Platelets 181; Potassium 4.3; Sodium 132; TSH 4.192  Recent Lipid Panel    Component Value Date/Time   CHOL 177 08/17/2020 0500   TRIG 145 08/17/2020 0500   HDL 44 08/17/2020 0500   CHOLHDL 4.0 08/17/2020 0500   VLDL 29 08/17/2020 0500   LDLCALC 104 (H) 08/17/2020 0500    Physical Exam:    VS:  BP 122/60   Pulse 71   Ht 5\' 5"  (1.651 m)   Wt 164 lb 12.8 oz (74.8 kg)   SpO2 94%   BMI 27.42 kg/m     Wt Readings from Last 3 Encounters:  09/05/20 164 lb 12.8 oz (74.8 kg)  09/04/20 165 lb (74.8 kg)  07/13/20 161 lb 9.6 oz (73.3 kg)     GEN: Patient is in no acute distress HEENT: Normal NECK: No JVD; No carotid bruits LYMPHATICS: No lymphadenopathy CARDIAC: Hear sounds regular, 2/6 systolic murmur at the apex. RESPIRATORY:  Clear to auscultation without rales, wheezing or rhonchi  ABDOMEN: Soft, non-tender, non-distended MUSCULOSKELETAL:  No edema; No deformity  SKIN: Warm and dry NEUROLOGIC:  Alert and oriented x 3 PSYCHIATRIC:  Normal affect   Signed, Chris Lindau, MD  09/05/2020 11:06 AM    [place]

## 2020-09-11 LAB — CULTURE, FUNGUS WITHOUT SMEAR

## 2020-09-14 DIAGNOSIS — J4530 Mild persistent asthma, uncomplicated: Secondary | ICD-10-CM | POA: Diagnosis not present

## 2020-09-14 DIAGNOSIS — J301 Allergic rhinitis due to pollen: Secondary | ICD-10-CM | POA: Diagnosis not present

## 2020-09-14 DIAGNOSIS — R918 Other nonspecific abnormal finding of lung field: Secondary | ICD-10-CM | POA: Diagnosis not present

## 2020-09-18 ENCOUNTER — Ambulatory Visit: Payer: PPO | Admitting: Neurology

## 2020-09-24 DIAGNOSIS — R600 Localized edema: Secondary | ICD-10-CM | POA: Diagnosis not present

## 2020-09-24 DIAGNOSIS — Z6827 Body mass index (BMI) 27.0-27.9, adult: Secondary | ICD-10-CM | POA: Diagnosis not present

## 2020-09-24 DIAGNOSIS — J449 Chronic obstructive pulmonary disease, unspecified: Secondary | ICD-10-CM | POA: Diagnosis not present

## 2020-09-24 DIAGNOSIS — I10 Essential (primary) hypertension: Secondary | ICD-10-CM | POA: Diagnosis not present

## 2020-09-25 DIAGNOSIS — I129 Hypertensive chronic kidney disease with stage 1 through stage 4 chronic kidney disease, or unspecified chronic kidney disease: Secondary | ICD-10-CM | POA: Diagnosis not present

## 2020-09-25 DIAGNOSIS — N200 Calculus of kidney: Secondary | ICD-10-CM | POA: Diagnosis not present

## 2020-09-25 DIAGNOSIS — J449 Chronic obstructive pulmonary disease, unspecified: Secondary | ICD-10-CM | POA: Diagnosis not present

## 2020-09-25 DIAGNOSIS — I2510 Atherosclerotic heart disease of native coronary artery without angina pectoris: Secondary | ICD-10-CM | POA: Diagnosis not present

## 2020-09-25 DIAGNOSIS — I471 Supraventricular tachycardia: Secondary | ICD-10-CM | POA: Diagnosis not present

## 2020-09-25 DIAGNOSIS — H4710 Unspecified papilledema: Secondary | ICD-10-CM | POA: Diagnosis not present

## 2020-09-25 DIAGNOSIS — E785 Hyperlipidemia, unspecified: Secondary | ICD-10-CM | POA: Diagnosis not present

## 2020-09-25 DIAGNOSIS — F0390 Unspecified dementia without behavioral disturbance: Secondary | ICD-10-CM | POA: Diagnosis not present

## 2020-09-25 DIAGNOSIS — R600 Localized edema: Secondary | ICD-10-CM | POA: Diagnosis not present

## 2020-09-25 DIAGNOSIS — E46 Unspecified protein-calorie malnutrition: Secondary | ICD-10-CM | POA: Diagnosis not present

## 2020-09-25 DIAGNOSIS — H02401 Unspecified ptosis of right eyelid: Secondary | ICD-10-CM | POA: Diagnosis not present

## 2020-09-25 DIAGNOSIS — N1831 Chronic kidney disease, stage 3a: Secondary | ICD-10-CM | POA: Diagnosis not present

## 2020-09-25 DIAGNOSIS — I69398 Other sequelae of cerebral infarction: Secondary | ICD-10-CM | POA: Diagnosis not present

## 2020-09-25 DIAGNOSIS — K449 Diaphragmatic hernia without obstruction or gangrene: Secondary | ICD-10-CM | POA: Diagnosis not present

## 2020-09-25 DIAGNOSIS — G603 Idiopathic progressive neuropathy: Secondary | ICD-10-CM | POA: Diagnosis not present

## 2020-09-25 DIAGNOSIS — M818 Other osteoporosis without current pathological fracture: Secondary | ICD-10-CM | POA: Diagnosis not present

## 2020-09-25 DIAGNOSIS — K5900 Constipation, unspecified: Secondary | ICD-10-CM | POA: Diagnosis not present

## 2020-09-25 DIAGNOSIS — N400 Enlarged prostate without lower urinary tract symptoms: Secondary | ICD-10-CM | POA: Diagnosis not present

## 2020-09-25 DIAGNOSIS — H532 Diplopia: Secondary | ICD-10-CM | POA: Diagnosis not present

## 2020-09-25 DIAGNOSIS — M5126 Other intervertebral disc displacement, lumbar region: Secondary | ICD-10-CM | POA: Diagnosis not present

## 2020-09-25 DIAGNOSIS — E538 Deficiency of other specified B group vitamins: Secondary | ICD-10-CM | POA: Diagnosis not present

## 2020-09-25 DIAGNOSIS — D631 Anemia in chronic kidney disease: Secondary | ICD-10-CM | POA: Diagnosis not present

## 2020-09-25 DIAGNOSIS — B353 Tinea pedis: Secondary | ICD-10-CM | POA: Diagnosis not present

## 2020-09-25 DIAGNOSIS — I472 Ventricular tachycardia: Secondary | ICD-10-CM | POA: Diagnosis not present

## 2020-09-25 DIAGNOSIS — G2581 Restless legs syndrome: Secondary | ICD-10-CM | POA: Diagnosis not present

## 2020-10-04 ENCOUNTER — Ambulatory Visit: Payer: PPO | Admitting: Neurology

## 2020-10-25 DIAGNOSIS — G4700 Insomnia, unspecified: Secondary | ICD-10-CM | POA: Diagnosis not present

## 2020-10-25 DIAGNOSIS — Z6828 Body mass index (BMI) 28.0-28.9, adult: Secondary | ICD-10-CM | POA: Diagnosis not present

## 2020-11-02 ENCOUNTER — Other Ambulatory Visit: Payer: Self-pay | Admitting: Cardiology

## 2020-12-14 DIAGNOSIS — J301 Allergic rhinitis due to pollen: Secondary | ICD-10-CM | POA: Diagnosis not present

## 2020-12-14 DIAGNOSIS — J439 Emphysema, unspecified: Secondary | ICD-10-CM | POA: Diagnosis not present

## 2020-12-14 DIAGNOSIS — R918 Other nonspecific abnormal finding of lung field: Secondary | ICD-10-CM | POA: Diagnosis not present

## 2020-12-14 DIAGNOSIS — J4530 Mild persistent asthma, uncomplicated: Secondary | ICD-10-CM | POA: Diagnosis not present

## 2020-12-17 ENCOUNTER — Ambulatory Visit: Payer: PPO | Admitting: Cardiology

## 2020-12-17 ENCOUNTER — Encounter: Payer: Self-pay | Admitting: Cardiology

## 2020-12-17 ENCOUNTER — Other Ambulatory Visit: Payer: Self-pay

## 2020-12-17 VITALS — BP 112/84 | HR 83 | Ht 66.0 in | Wt 174.6 lb

## 2020-12-17 DIAGNOSIS — R059 Cough, unspecified: Secondary | ICD-10-CM | POA: Diagnosis not present

## 2020-12-17 DIAGNOSIS — I472 Ventricular tachycardia, unspecified: Secondary | ICD-10-CM | POA: Diagnosis not present

## 2020-12-17 NOTE — Patient Instructions (Addendum)
Medication Instructions:  °Your physician recommends that you continue on your current medications as directed. Please refer to the Current Medication list given to you today. ° °*If you need a refill on your cardiac medications before your next appointment, please call your pharmacy* ° ° °Lab Work: °None ordered ° ° °Testing/Procedures: °None ordered ° ° °Follow-Up: °At [facility], you and your health needs are our priority.  As part of our continuing mission to provide you with exceptional heart care, we have created designated Provider Care Teams.  These Care Teams include your primary Cardiologist (physician) and Advanced Practice Providers (APPs -  Physician Assistants and Nurse Practitioners) who all work together to provide you with the care you need, when you need it. ° °Your next appointment:   °1 year(s) ° °The format for your next appointment:   °In Person ° °Provider:   °Will Camnitz, MD ° ° ° °Thank you for choosing [facility]!! ° ° °Talene Glastetter, RN °[phone] ° ° ° °

## 2020-12-17 NOTE — Progress Notes (Signed)
Electrophysiology Office Note   Date:  12/17/2020   ID:  Chris Braun, DOB 1935-12-10, MRN [id number]  PCP:  Penelope Coop, FNP  Cardiologist:  Revankar Primary Electrophysiologist:   Zackari Ruane Meredith Leeds, MD    Chief Complaint: NSVT   History of Present Illness: Chris Braun is a 85 y.o. male who is being seen today for the evaluation of VT at the request of Penelope Coop, FNP. Presenting today for electrophysiology evaluation.  He has a history significant for hypertension, hyperlipidemia, CVA.  He wore a cardiac monitor that showed episodes of nonsustained VT.  He has no history of heart failure.  Electrolytes were checked without major abnormality.  He is currently on amiodarone.  Today, denies symptoms of palpitations, chest pain, shortness of breath, orthopnea, PND, lower extremity edema, claudication, dizziness, presyncope, syncope, bleeding, or neurologic sequela. The patient is tolerating medications without difficulties.  Since being seen he has done relatively well.  He is seen by neurology and has been having issues with balance and hallucinations.  He has been told by neurology that if hallucinations are not scary and bothersome, and there is no need to treat them.  He is unaware of further arrhythmias.  He has no cardiac complaints.  No chest pain or shortness of breath.  His wife states that he is in a wheelchair most the time at home but does use a walker intermittently.   Past Medical History:  Diagnosis Date   Acute hypoxemic respiratory failure ([place]) 05/27/2018   Acute ischemic stroke ([place]) 08/17/2020   AF (paroxysmal atrial fibrillation) ([place]) 08/17/2020   AKI (acute kidney injury) ([place]) 05/27/2018   Aspiration into airway    Asthma    Atrial tachycardia ([place]) 07/26/2018   B12 deficiency    Bilateral optic neuropathy    CAD (coronary artery disease) 08/24/2014   Carotid stenosis, bilateral 08/24/2014   Cataracts, bilateral    Compression fracture of L2 lumbar  vertebra, closed, initial encounter ([place]) 10/27/2019   COPD (chronic obstructive pulmonary disease) ([place])    Depression    Diverticulosis    per colonscopy   Dyslipidemia 08/24/2014   Falls frequently 06/03/2015   Food impaction of esophagus    Gait abnormality 01/19/2018   GERD (gastroesophageal reflux disease)    Hiatal hernia    High cholesterol    Hypertension    Hypoxia    Idiopathic progressive polyneuropathy    Kidney stones    Lumbar vertebral fracture, pathologic 10/27/2019   Macular degeneration    Memory disorder 01/19/2018   Myocardial infarction [facility]) 2002   Oculomotor nerve palsy, 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   Osteoporosis    Renal insufficiency 05/27/2018   Right kidney stone 05/27/2018   Right upper lobe pneumonia 05/27/2018   Risk for falls 05/28/2015   RLS (restless legs syndrome) 04/05/2020   Status post ablation of ventricular arrhythmia 04/27/2018   Stroke (cerebrum) ([place]) 01/17/2014   Stroke ([place])    SVT (supraventricular tachycardia) ([place]) 12/09/2019   Weakness of distal arms and legs 06/03/2015   Past Surgical History:  Procedure Laterality Date   CARDIAC CATHETERIZATION  2002,   stents    CATARACT EXTRACTION Left 2016   Colonscopy  2012   CYSTOSCOPY W/ URETERAL STENT PLACEMENT Right 05/30/2018   Procedure: CYSTOSCOPY WITH RETROGRADE PYELOGRAM/URETERAL STENT PLACEMENT;  Surgeon: Irine Seal, MD;  Location: [place];  Service: Urology;  Laterality: Right;   ESOPHAGOGASTRODUODENOSCOPY (EGD) WITH PROPOFOL N/A 02/10/2020   Procedure: ESOPHAGOGASTRODUODENOSCOPY (EGD) WITH  PROPOFOL;  Surgeon: Yetta Flock, MD;  Location: [place];  Service: Gastroenterology;  Laterality: N/A;   FOREIGN BODY REMOVAL  02/10/2020   Procedure: FOREIGN BODY REMOVAL;  Surgeon: Yetta Flock, MD;  Location: [facility] END[facility]COPY;  Service: Gastroenterology;;   HIP FRACTURE SURGERY  2008   right   HYDROCELE EXCISION  10/12/2014   LITHOTRIPSY     2007, 2009, 2010, 2011, 2012, 2013   LUMBAR  LAMINECTOMY/DECOMPRESSION MICRODISCECTOMY  03/15/2012   Procedure: LUMBAR LAMINECTOMY/DECOMPRESSION MICRODISCECTOMY 1 LEVEL;  Surgeon: Elaina Hoops, MD;  Location: [place] NEURO ORS;  Service: Neurosurgery;  Laterality: Left;  Left lumbar three-four decompressive lumbar laminectomy, discectomy   MAXIMUM ACCESS (MAS)POSTERIOR LUMBAR INTERBODY FUSION (PLIF) 2 LEVEL N/A 12/08/2012   Procedure: FOR MAXIMUM ACCESS (MAS) POSTERIOR LUMBAR INTERBODY FUSION (PLIF) 2 LEVEL;  Surgeon: Elaina Hoops, MD;  Location: [place] NEURO ORS;  Service: Neurosurgery;  Laterality: N/A;  FOR MAXIMUM ACCESS (MAS) POSTERIOR LUMBAR INTERBODY FUSION (PLIF) 2 LEVEL   ROTATOR CUFF REPAIR  1999   bil    SALIVARY STONE REMOVAL  1964   1964   TOTAL HIP ARTHROPLASTY Right 2008   VT study     with Ablation     Current Outpatient Medications  Medication Sig Dispense Refill   acetaminophen (TYLENOL) 650 MG CR tablet Take 1,300 mg by mouth every 8 (eight) hours as needed for pain.     amiodarone (PACERONE) 200 MG tablet Take 100 mg by mouth daily.     apixaban (ELIQUIS) 5 MG TABS tablet Take 1 tablet (5 mg total) by mouth 2 (two) times daily. Holding for spinal tap on 08/21/2020.  May resume after 8 hours after the spinal tap.     aspirin EC 81 MG tablet Take 1 tablet (81 mg total) by mouth at bedtime. Holding for spinal tap on 08/21/2020.  May resume after 8 hours after the spinal tap.     atorvastatin (LIPITOR) 80 MG tablet Take 80 mg by mouth at bedtime.     benzonatate (TESSALON) 200 MG capsule Take 200 mg by mouth 3 (three) times daily as needed for cough.     calcitonin, salmon, (MIACALCIN/FORTICAL) 200 UNIT/ACT nasal spray Place 1 spray into alternate nostrils at bedtime.     Calcium Carb-Cholecalciferol (CALCIUM-VITAMIN D3) 600-200 MG-UNIT TABS Take 600 mg by mouth 2 (two) times daily.     digoxin (LANOXIN) 0.125 MG tablet Take 1 tablet by mouth every other day.     docusate sodium (COLACE) 100 MG capsule Take 100 mg by mouth at bedtime.      donepezil (ARICEPT) 5 MG tablet Take 5 mg by mouth at bedtime.      Ferrous Gluconate (IRON 27 PO) Take 27 mg by mouth every evening.     furosemide (LASIX) 20 MG tablet Take 20 mg by mouth daily as needed for fluid.     Guaifenesin (MUCINEX MAXIMUM STRENGTH) 1200 MG TB12 Take 1,200 mg by mouth in the morning and at bedtime.     ipratropium-albuterol (DUONEB) 0.5-2.5 (3) MG/3ML SOLN Take 3 mLs by nebulization 2 (two) times daily.      ketoconazole (NIZORAL) 2 % cream Apply 1 application topically daily as needed for irritation (on feet). Apply as needed to affected area.       lactulose (CHRONULAC) 10 GM/15ML solution Take 10 g by mouth at bedtime as needed for mild constipation.      loperamide (IMODIUM) 2 MG capsule Take 2-4 mg by mouth as needed  for diarrhea or loose stools.     loratadine (CLARITIN) 10 MG tablet Take 10 mg by mouth in the morning.      Lutein 20 MG TABS Take 20 mg by mouth daily.     memantine (NAMENDA) 10 MG tablet Take 1 tablet (10 mg total) by mouth 2 (two) times daily. 180 tablet 3   methocarbamol (ROBAXIN) 500 MG tablet Take 500 mg by mouth 3 (three) times daily as needed for pain.     montelukast (SINGULAIR) 10 MG tablet Take 10 mg by mouth daily.      Multiple Vitamins-Minerals (MULTIVITAMIN PO) Take 1 tablet by mouth every evening.      nitroGLYCERIN (NITROSTAT) 0.4 MG SL tablet DISSOLVE 1 TABLET UNDER THE TONGUE EVERY 5 MINUTES AS NEEDED FOR CHEST PAIN. DO NOT EXCEED A TOTAL OF 3 DOSES IN 15 MINUTES. 25 tablet 0   ondansetron (ZOFRAN-ODT) 4 MG disintegrating tablet Take 4 mg by mouth every 8 (eight) hours as needed for nausea or vomiting.      pantoprazole (PROTONIX) 40 MG tablet Take 1 tablet (40 mg total) by mouth 2 (two) times daily. 60 tablet 0   potassium chloride (KLOR-CON) 10 MEQ tablet Take 10 mEq by mouth daily as needed for other (when taking furosemide). When taking Lasix     QUEtiapine (SEROQUEL) 25 MG tablet Take 25 mg by mouth at bedtime.      rOPINIRole (REQUIP) 1 MG tablet Take 1 mg by mouth 3 (three) times daily.     SYMBICORT 160-4.5 MCG/ACT inhaler Inhale 2 puffs into the lungs 2 (two) times daily.  12   tamsulosin (FLOMAX) 0.4 MG CAPS capsule Take 0.4 mg by mouth daily after supper.     vitamin B-12 (CYANOCOBALAMIN) 1000 MCG tablet Take 1,000 mcg by mouth daily.     No current facility-administered medications for this visit.    Allergies:   Other, Pneumococcal polysaccharide vaccine, Hydrocodone-acetaminophen, Ibuprofen, and Oxycodone   Social History:  The patient  reports that he quit smoking about 19 years ago. His smoking use included cigarettes. He has a 45.00 pack-year smoking history. His smokeless tobacco use includes chew. He reports that he does not drink alcohol and does not use drugs.   Family History:  The patient's family history includes Alzheimer's disease in his sister; Heart disease in his father, mother, sister, sister, and sister; Hemolytic uremic syndrome in his mother; Stroke in his father.   ROS:  Please see the history of present illness.   Otherwise, review of systems is positive for none.   All other systems are reviewed and negative.   PHYSICAL EXAM: VS:  BP 112/84   Pulse 83   Ht 5\' 6"  (1.676 m)   Wt 174 lb 9.6 oz (79.2 kg)   SpO2 92%   BMI 28.18 kg/m  , BMI Body mass index is 28.18 kg/m. GEN: Well nourished, well developed, in no acute distress  HEENT: normal  Neck: no JVD, carotid bruits, or masses Cardiac: RRR; no murmurs, rubs, or gallops,no edema  Respiratory:  clear to auscultation bilaterally, normal work of breathing GI: soft, nontender, nondistended, + BS MS: no deformity or atrophy  Skin: warm and dry Neuro:  Strength and sensation are intact Psych: euthymic mood, full affect  EKG:  EKG is ordered today. Personal review of the ekg ordered shows sinus rhythm, rate 83, inferior Q waves  Recent Labs: 02/16/2020: Magnesium 1.5 06/08/2020: NT-Pro BNP 487 08/16/2020: ALT  19 08/17/2020: BUN 10; Creatinine,  Ser 1.24; Hemoglobin 15.2; Platelets 181; Potassium 4.3; Sodium 132; TSH 4.192    Lipid Panel     Component Value Date/Time   CHOL 177 08/17/2020 0500   TRIG 145 08/17/2020 0500   HDL 44 08/17/2020 0500   CHOLHDL 4.0 08/17/2020 0500   VLDL 29 08/17/2020 0500   LDLCALC 104 (H) 08/17/2020 0500     Wt Readings from Last 3 Encounters:  12/17/20 174 lb 9.6 oz (79.2 kg)  09/05/20 164 lb 12.8 oz (74.8 kg)  09/04/20 165 lb (74.8 kg)      Other studies Reviewed: Additional studies/ records that were reviewed today include: TTE 09/03/18  Review of the above records today demonstrates:   1. Basal and mid inferolateral wall, basal inferoseptal segment, and basal inferior segment are abnormal.  2. The left ventricle has low normal systolic function, with an ejection fraction of 50-55%. The cavity size was normal. Left ventricular diastolic Doppler parameters are consistent with impaired relaxation.  3. The right ventricle has normal systolic function. The cavity was normal. There is no increase in right ventricular wall thickness.  4. Mild thickening of the mitral valve leaflet. There is mild mitral annular calcification present. No evidence of mitral valve stenosis.  5. The aortic valve is tricuspid. Mild thickening of the aortic valve. No stenosis of the aortic valve.  6. The aortic root, ascending aorta and aortic arch are normal in size and structure.  Myoview 08/25/18 Nuclear stress EF: 57%. The left ventricular ejection fraction is normal (55-65%). There was no ST segment deviation noted during stress. The study is normal. This is a low risk study.  [facility] 09/24/18 personally reviewed Minimum heart rate: 40 BPM.  Average heart rate: 72 BPM.  Maximal heart rate 104 BPM. Atrial arrhythmia: Patient had multiple narrow complex tachycardias the longest of which lasted 3 hours approximately entricular arrhythmia: Multiple episodes of nonsustained ventricular  tachycardia the longest lasted 11.9 seconds   ASSESSMENT AND PLAN:  1.  Nonsustained ventricular tachycardia: Currently on amiodarone.  High risk medication monitoring.  Had a VT ablation in [facility] in 2008.  Is continued to have short episodes of VT on cardiac monitoring.  The minimally symptomatic.  2.  Coronary artery disease: MI with catheterization and stenting in 2003.  No current chest pain.  3.  Atrial tachycardia: Continue amiodarone  4.  Paroxysmal atrial fibrillation: Currently on Eliquis 5 mg twice daily, amiodarone 100 mg daily.  CHA2DS2-VASc of 6.  Minimal episodes.  No changes.  Current medicines are reviewed at length with the patient today.   The patient does not have concerns regarding his medicines.  The following changes were made today: none  Labs/ tests ordered today include:  Orders Placed This Encounter  Procedures   EKG 12-Lead    Disposition:   FU with Kalynn Declercq 12 months  Signed, Lev Cervone Meredith Leeds, MD  12/17/2020 12:04 PM     [facility] [address] [place] [address] [phone] (office) [phone] (fax)

## 2020-12-28 DIAGNOSIS — Z23 Encounter for immunization: Secondary | ICD-10-CM | POA: Diagnosis not present

## 2020-12-31 DIAGNOSIS — Z9181 History of falling: Secondary | ICD-10-CM | POA: Diagnosis not present

## 2020-12-31 DIAGNOSIS — R059 Cough, unspecified: Secondary | ICD-10-CM | POA: Diagnosis not present

## 2020-12-31 DIAGNOSIS — G4700 Insomnia, unspecified: Secondary | ICD-10-CM | POA: Diagnosis not present

## 2020-12-31 DIAGNOSIS — Z6829 Body mass index (BMI) 29.0-29.9, adult: Secondary | ICD-10-CM | POA: Diagnosis not present

## 2020-12-31 DIAGNOSIS — I4729 Other ventricular tachycardia: Secondary | ICD-10-CM | POA: Diagnosis not present

## 2020-12-31 DIAGNOSIS — Z139 Encounter for screening, unspecified: Secondary | ICD-10-CM | POA: Diagnosis not present

## 2020-12-31 DIAGNOSIS — Z79899 Other long term (current) drug therapy: Secondary | ICD-10-CM | POA: Diagnosis not present

## 2020-12-31 DIAGNOSIS — I10 Essential (primary) hypertension: Secondary | ICD-10-CM | POA: Diagnosis not present

## 2020-12-31 DIAGNOSIS — R600 Localized edema: Secondary | ICD-10-CM | POA: Diagnosis not present

## 2021-01-01 ENCOUNTER — Other Ambulatory Visit: Payer: Self-pay | Admitting: Neurology

## 2021-01-01 DIAGNOSIS — J439 Emphysema, unspecified: Secondary | ICD-10-CM | POA: Diagnosis not present

## 2021-01-01 DIAGNOSIS — J301 Allergic rhinitis due to pollen: Secondary | ICD-10-CM | POA: Diagnosis not present

## 2021-01-01 DIAGNOSIS — J4530 Mild persistent asthma, uncomplicated: Secondary | ICD-10-CM | POA: Diagnosis not present

## 2021-01-01 DIAGNOSIS — R918 Other nonspecific abnormal finding of lung field: Secondary | ICD-10-CM | POA: Diagnosis not present

## 2021-01-03 DIAGNOSIS — Z993 Dependence on wheelchair: Secondary | ICD-10-CM | POA: Diagnosis not present

## 2021-01-03 DIAGNOSIS — I69351 Hemiplegia and hemiparesis following cerebral infarction affecting right dominant side: Secondary | ICD-10-CM | POA: Diagnosis not present

## 2021-01-03 DIAGNOSIS — J439 Emphysema, unspecified: Secondary | ICD-10-CM | POA: Diagnosis not present

## 2021-01-03 DIAGNOSIS — F0390 Unspecified dementia without behavioral disturbance: Secondary | ICD-10-CM | POA: Diagnosis not present

## 2021-01-03 DIAGNOSIS — I714 Abdominal aortic aneurysm, without rupture, unspecified: Secondary | ICD-10-CM | POA: Diagnosis not present

## 2021-01-03 DIAGNOSIS — D692 Other nonthrombocytopenic purpura: Secondary | ICD-10-CM | POA: Diagnosis not present

## 2021-01-03 DIAGNOSIS — Z515 Encounter for palliative care: Secondary | ICD-10-CM | POA: Diagnosis not present

## 2021-02-04 ENCOUNTER — Other Ambulatory Visit: Payer: Self-pay | Admitting: *Deleted

## 2021-02-04 ENCOUNTER — Other Ambulatory Visit: Payer: Self-pay

## 2021-02-04 MED ORDER — ROPINIROLE HCL 1 MG PO TABS: 1.0000 mg | ORAL_TABLET | Freq: Three times a day (TID) | ORAL | 1 refills | Status: DC

## 2021-02-09 DIAGNOSIS — N170 Acute kidney failure with tubular necrosis: Secondary | ICD-10-CM | POA: Diagnosis not present

## 2021-02-09 DIAGNOSIS — Z79899 Other long term (current) drug therapy: Secondary | ICD-10-CM | POA: Diagnosis not present

## 2021-02-09 DIAGNOSIS — E785 Hyperlipidemia, unspecified: Secondary | ICD-10-CM | POA: Diagnosis not present

## 2021-02-09 DIAGNOSIS — I517 Cardiomegaly: Secondary | ICD-10-CM | POA: Diagnosis not present

## 2021-02-09 DIAGNOSIS — F0390 Unspecified dementia without behavioral disturbance: Secondary | ICD-10-CM | POA: Diagnosis not present

## 2021-02-09 DIAGNOSIS — K449 Diaphragmatic hernia without obstruction or gangrene: Secondary | ICD-10-CM | POA: Diagnosis not present

## 2021-02-09 DIAGNOSIS — K219 Gastro-esophageal reflux disease without esophagitis: Secondary | ICD-10-CM | POA: Diagnosis not present

## 2021-02-09 DIAGNOSIS — N179 Acute kidney failure, unspecified: Secondary | ICD-10-CM | POA: Diagnosis not present

## 2021-02-09 DIAGNOSIS — R410 Disorientation, unspecified: Secondary | ICD-10-CM | POA: Diagnosis not present

## 2021-02-09 DIAGNOSIS — N400 Enlarged prostate without lower urinary tract symptoms: Secondary | ICD-10-CM | POA: Diagnosis not present

## 2021-02-09 DIAGNOSIS — R059 Cough, unspecified: Secondary | ICD-10-CM | POA: Diagnosis not present

## 2021-02-09 DIAGNOSIS — A419 Sepsis, unspecified organism: Secondary | ICD-10-CM | POA: Diagnosis not present

## 2021-02-09 DIAGNOSIS — I2510 Atherosclerotic heart disease of native coronary artery without angina pectoris: Secondary | ICD-10-CM | POA: Diagnosis not present

## 2021-02-09 DIAGNOSIS — Z8673 Personal history of transient ischemic attack (TIA), and cerebral infarction without residual deficits: Secondary | ICD-10-CM | POA: Diagnosis not present

## 2021-02-09 DIAGNOSIS — R531 Weakness: Secondary | ICD-10-CM | POA: Diagnosis not present

## 2021-02-09 DIAGNOSIS — Z888 Allergy status to other drugs, medicaments and biological substances status: Secondary | ICD-10-CM | POA: Diagnosis not present

## 2021-02-09 DIAGNOSIS — J9601 Acute respiratory failure with hypoxia: Secondary | ICD-10-CM | POA: Diagnosis not present

## 2021-02-09 DIAGNOSIS — J189 Pneumonia, unspecified organism: Secondary | ICD-10-CM | POA: Diagnosis not present

## 2021-02-09 DIAGNOSIS — Z7982 Long term (current) use of aspirin: Secondary | ICD-10-CM | POA: Diagnosis not present

## 2021-02-09 DIAGNOSIS — E860 Dehydration: Secondary | ICD-10-CM | POA: Diagnosis not present

## 2021-02-09 DIAGNOSIS — I252 Old myocardial infarction: Secondary | ICD-10-CM | POA: Diagnosis not present

## 2021-02-09 DIAGNOSIS — J439 Emphysema, unspecified: Secondary | ICD-10-CM | POA: Diagnosis not present

## 2021-02-09 DIAGNOSIS — J440 Chronic obstructive pulmonary disease with acute lower respiratory infection: Secondary | ICD-10-CM | POA: Diagnosis not present

## 2021-02-09 DIAGNOSIS — Z887 Allergy status to serum and vaccine status: Secondary | ICD-10-CM | POA: Diagnosis not present

## 2021-02-09 DIAGNOSIS — G9341 Metabolic encephalopathy: Secondary | ICD-10-CM | POA: Diagnosis not present

## 2021-02-09 DIAGNOSIS — Z792 Long term (current) use of antibiotics: Secondary | ICD-10-CM | POA: Diagnosis not present

## 2021-02-09 DIAGNOSIS — M1990 Unspecified osteoarthritis, unspecified site: Secondary | ICD-10-CM | POA: Diagnosis not present

## 2021-02-09 DIAGNOSIS — F419 Anxiety disorder, unspecified: Secondary | ICD-10-CM | POA: Diagnosis not present

## 2021-02-09 DIAGNOSIS — F32A Depression, unspecified: Secondary | ICD-10-CM | POA: Diagnosis not present

## 2021-02-14 DIAGNOSIS — G9341 Metabolic encephalopathy: Secondary | ICD-10-CM | POA: Diagnosis not present

## 2021-02-14 DIAGNOSIS — A419 Sepsis, unspecified organism: Secondary | ICD-10-CM | POA: Diagnosis not present

## 2021-02-14 DIAGNOSIS — J189 Pneumonia, unspecified organism: Secondary | ICD-10-CM | POA: Diagnosis not present

## 2021-02-14 DIAGNOSIS — N179 Acute kidney failure, unspecified: Secondary | ICD-10-CM | POA: Diagnosis not present

## 2021-02-14 DIAGNOSIS — I129 Hypertensive chronic kidney disease with stage 1 through stage 4 chronic kidney disease, or unspecified chronic kidney disease: Secondary | ICD-10-CM | POA: Diagnosis not present

## 2021-02-14 DIAGNOSIS — H532 Diplopia: Secondary | ICD-10-CM | POA: Diagnosis not present

## 2021-02-14 DIAGNOSIS — B353 Tinea pedis: Secondary | ICD-10-CM | POA: Diagnosis not present

## 2021-02-14 DIAGNOSIS — N1831 Chronic kidney disease, stage 3a: Secondary | ICD-10-CM | POA: Diagnosis not present

## 2021-02-14 DIAGNOSIS — J9601 Acute respiratory failure with hypoxia: Secondary | ICD-10-CM | POA: Diagnosis not present

## 2021-02-14 DIAGNOSIS — I700 Atherosclerosis of aorta: Secondary | ICD-10-CM | POA: Diagnosis not present

## 2021-02-14 DIAGNOSIS — J45909 Unspecified asthma, uncomplicated: Secondary | ICD-10-CM | POA: Diagnosis not present

## 2021-02-14 DIAGNOSIS — M1990 Unspecified osteoarthritis, unspecified site: Secondary | ICD-10-CM | POA: Diagnosis not present

## 2021-02-14 DIAGNOSIS — K449 Diaphragmatic hernia without obstruction or gangrene: Secondary | ICD-10-CM | POA: Diagnosis not present

## 2021-02-14 DIAGNOSIS — M810 Age-related osteoporosis without current pathological fracture: Secondary | ICD-10-CM | POA: Diagnosis not present

## 2021-02-14 DIAGNOSIS — D631 Anemia in chronic kidney disease: Secondary | ICD-10-CM | POA: Diagnosis not present

## 2021-02-14 DIAGNOSIS — M5126 Other intervertebral disc displacement, lumbar region: Secondary | ICD-10-CM | POA: Diagnosis not present

## 2021-02-14 DIAGNOSIS — E538 Deficiency of other specified B group vitamins: Secondary | ICD-10-CM | POA: Diagnosis not present

## 2021-02-14 DIAGNOSIS — H4710 Unspecified papilledema: Secondary | ICD-10-CM | POA: Diagnosis not present

## 2021-02-14 DIAGNOSIS — J439 Emphysema, unspecified: Secondary | ICD-10-CM | POA: Diagnosis not present

## 2021-02-14 DIAGNOSIS — E7800 Pure hypercholesterolemia, unspecified: Secondary | ICD-10-CM | POA: Diagnosis not present

## 2021-02-14 DIAGNOSIS — E46 Unspecified protein-calorie malnutrition: Secondary | ICD-10-CM | POA: Diagnosis not present

## 2021-02-14 DIAGNOSIS — I252 Old myocardial infarction: Secondary | ICD-10-CM | POA: Diagnosis not present

## 2021-02-14 DIAGNOSIS — I2510 Atherosclerotic heart disease of native coronary artery without angina pectoris: Secondary | ICD-10-CM | POA: Diagnosis not present

## 2021-02-14 DIAGNOSIS — H02401 Unspecified ptosis of right eyelid: Secondary | ICD-10-CM | POA: Diagnosis not present

## 2021-02-19 DIAGNOSIS — J189 Pneumonia, unspecified organism: Secondary | ICD-10-CM | POA: Diagnosis not present

## 2021-02-19 DIAGNOSIS — Z6829 Body mass index (BMI) 29.0-29.9, adult: Secondary | ICD-10-CM | POA: Diagnosis not present

## 2021-02-19 DIAGNOSIS — R918 Other nonspecific abnormal finding of lung field: Secondary | ICD-10-CM | POA: Diagnosis not present

## 2021-02-19 DIAGNOSIS — J439 Emphysema, unspecified: Secondary | ICD-10-CM | POA: Diagnosis not present

## 2021-02-19 DIAGNOSIS — Z79899 Other long term (current) drug therapy: Secondary | ICD-10-CM | POA: Diagnosis not present

## 2021-02-19 DIAGNOSIS — J4530 Mild persistent asthma, uncomplicated: Secondary | ICD-10-CM | POA: Diagnosis not present

## 2021-02-19 DIAGNOSIS — J301 Allergic rhinitis due to pollen: Secondary | ICD-10-CM | POA: Diagnosis not present

## 2021-02-22 DIAGNOSIS — J439 Emphysema, unspecified: Secondary | ICD-10-CM | POA: Diagnosis not present

## 2021-02-22 DIAGNOSIS — I69351 Hemiplegia and hemiparesis following cerebral infarction affecting right dominant side: Secondary | ICD-10-CM | POA: Diagnosis not present

## 2021-02-22 DIAGNOSIS — Z515 Encounter for palliative care: Secondary | ICD-10-CM | POA: Diagnosis not present

## 2021-02-22 DIAGNOSIS — D692 Other nonthrombocytopenic purpura: Secondary | ICD-10-CM | POA: Diagnosis not present

## 2021-02-22 DIAGNOSIS — Z7901 Long term (current) use of anticoagulants: Secondary | ICD-10-CM | POA: Diagnosis not present

## 2021-02-22 DIAGNOSIS — Z6825 Body mass index (BMI) 25.0-25.9, adult: Secondary | ICD-10-CM | POA: Diagnosis not present

## 2021-02-22 DIAGNOSIS — J189 Pneumonia, unspecified organism: Secondary | ICD-10-CM | POA: Diagnosis not present

## 2021-02-22 DIAGNOSIS — E441 Mild protein-calorie malnutrition: Secondary | ICD-10-CM | POA: Diagnosis not present

## 2021-02-22 DIAGNOSIS — Z993 Dependence on wheelchair: Secondary | ICD-10-CM | POA: Diagnosis not present

## 2021-02-22 DIAGNOSIS — I739 Peripheral vascular disease, unspecified: Secondary | ICD-10-CM | POA: Diagnosis not present

## 2021-02-22 DIAGNOSIS — F0390 Unspecified dementia without behavioral disturbance: Secondary | ICD-10-CM | POA: Diagnosis not present

## 2021-02-22 DIAGNOSIS — I471 Supraventricular tachycardia: Secondary | ICD-10-CM | POA: Diagnosis not present

## 2021-03-02 ENCOUNTER — Other Ambulatory Visit: Payer: Self-pay | Admitting: Cardiology

## 2021-03-05 DIAGNOSIS — Z8701 Personal history of pneumonia (recurrent): Secondary | ICD-10-CM | POA: Diagnosis not present

## 2021-03-05 DIAGNOSIS — Z09 Encounter for follow-up examination after completed treatment for conditions other than malignant neoplasm: Secondary | ICD-10-CM | POA: Diagnosis not present

## 2021-03-11 DIAGNOSIS — L578 Other skin changes due to chronic exposure to nonionizing radiation: Secondary | ICD-10-CM | POA: Diagnosis not present

## 2021-03-11 DIAGNOSIS — L820 Inflamed seborrheic keratosis: Secondary | ICD-10-CM | POA: Diagnosis not present

## 2021-03-11 DIAGNOSIS — L821 Other seborrheic keratosis: Secondary | ICD-10-CM | POA: Diagnosis not present

## 2021-03-12 ENCOUNTER — Ambulatory Visit: Payer: PPO | Admitting: Neurology

## 2021-03-15 DIAGNOSIS — L603 Nail dystrophy: Secondary | ICD-10-CM | POA: Diagnosis not present

## 2021-03-15 DIAGNOSIS — G8929 Other chronic pain: Secondary | ICD-10-CM | POA: Diagnosis not present

## 2021-03-15 DIAGNOSIS — M79674 Pain in right toe(s): Secondary | ICD-10-CM | POA: Diagnosis not present

## 2021-03-15 DIAGNOSIS — M79675 Pain in left toe(s): Secondary | ICD-10-CM | POA: Diagnosis not present

## 2021-03-15 DIAGNOSIS — I739 Peripheral vascular disease, unspecified: Secondary | ICD-10-CM | POA: Diagnosis not present

## 2021-03-16 DIAGNOSIS — H4710 Unspecified papilledema: Secondary | ICD-10-CM | POA: Diagnosis not present

## 2021-03-16 DIAGNOSIS — J45909 Unspecified asthma, uncomplicated: Secondary | ICD-10-CM | POA: Diagnosis not present

## 2021-03-16 DIAGNOSIS — M810 Age-related osteoporosis without current pathological fracture: Secondary | ICD-10-CM | POA: Diagnosis not present

## 2021-03-16 DIAGNOSIS — K449 Diaphragmatic hernia without obstruction or gangrene: Secondary | ICD-10-CM | POA: Diagnosis not present

## 2021-03-16 DIAGNOSIS — G9341 Metabolic encephalopathy: Secondary | ICD-10-CM | POA: Diagnosis not present

## 2021-03-16 DIAGNOSIS — J189 Pneumonia, unspecified organism: Secondary | ICD-10-CM | POA: Diagnosis not present

## 2021-03-16 DIAGNOSIS — N179 Acute kidney failure, unspecified: Secondary | ICD-10-CM | POA: Diagnosis not present

## 2021-03-16 DIAGNOSIS — M1990 Unspecified osteoarthritis, unspecified site: Secondary | ICD-10-CM | POA: Diagnosis not present

## 2021-03-16 DIAGNOSIS — B353 Tinea pedis: Secondary | ICD-10-CM | POA: Diagnosis not present

## 2021-03-16 DIAGNOSIS — E46 Unspecified protein-calorie malnutrition: Secondary | ICD-10-CM | POA: Diagnosis not present

## 2021-03-16 DIAGNOSIS — A419 Sepsis, unspecified organism: Secondary | ICD-10-CM | POA: Diagnosis not present

## 2021-03-16 DIAGNOSIS — N1831 Chronic kidney disease, stage 3a: Secondary | ICD-10-CM | POA: Diagnosis not present

## 2021-03-16 DIAGNOSIS — E538 Deficiency of other specified B group vitamins: Secondary | ICD-10-CM | POA: Diagnosis not present

## 2021-03-16 DIAGNOSIS — M5126 Other intervertebral disc displacement, lumbar region: Secondary | ICD-10-CM | POA: Diagnosis not present

## 2021-03-16 DIAGNOSIS — I252 Old myocardial infarction: Secondary | ICD-10-CM | POA: Diagnosis not present

## 2021-03-16 DIAGNOSIS — E7800 Pure hypercholesterolemia, unspecified: Secondary | ICD-10-CM | POA: Diagnosis not present

## 2021-03-16 DIAGNOSIS — I700 Atherosclerosis of aorta: Secondary | ICD-10-CM | POA: Diagnosis not present

## 2021-03-16 DIAGNOSIS — J9601 Acute respiratory failure with hypoxia: Secondary | ICD-10-CM | POA: Diagnosis not present

## 2021-03-16 DIAGNOSIS — H532 Diplopia: Secondary | ICD-10-CM | POA: Diagnosis not present

## 2021-03-16 DIAGNOSIS — I129 Hypertensive chronic kidney disease with stage 1 through stage 4 chronic kidney disease, or unspecified chronic kidney disease: Secondary | ICD-10-CM | POA: Diagnosis not present

## 2021-03-16 DIAGNOSIS — D631 Anemia in chronic kidney disease: Secondary | ICD-10-CM | POA: Diagnosis not present

## 2021-03-16 DIAGNOSIS — J439 Emphysema, unspecified: Secondary | ICD-10-CM | POA: Diagnosis not present

## 2021-03-16 DIAGNOSIS — I2510 Atherosclerotic heart disease of native coronary artery without angina pectoris: Secondary | ICD-10-CM | POA: Diagnosis not present

## 2021-03-16 DIAGNOSIS — H02401 Unspecified ptosis of right eyelid: Secondary | ICD-10-CM | POA: Diagnosis not present

## 2021-03-18 DIAGNOSIS — H348312 Tributary (branch) retinal vein occlusion, right eye, stable: Secondary | ICD-10-CM | POA: Diagnosis not present

## 2021-03-22 ENCOUNTER — Ambulatory Visit: Payer: PPO | Admitting: Cardiology

## 2021-03-27 DIAGNOSIS — R918 Other nonspecific abnormal finding of lung field: Secondary | ICD-10-CM | POA: Diagnosis not present

## 2021-03-27 DIAGNOSIS — J4530 Mild persistent asthma, uncomplicated: Secondary | ICD-10-CM | POA: Diagnosis not present

## 2021-03-27 DIAGNOSIS — J439 Emphysema, unspecified: Secondary | ICD-10-CM | POA: Diagnosis not present

## 2021-04-01 ENCOUNTER — Other Ambulatory Visit: Payer: Self-pay | Admitting: Cardiology

## 2021-04-01 ENCOUNTER — Telehealth: Payer: Self-pay | Admitting: Cardiology

## 2021-04-01 ENCOUNTER — Other Ambulatory Visit: Payer: Self-pay | Admitting: Diagnostic Neuroimaging

## 2021-04-01 DIAGNOSIS — J301 Allergic rhinitis due to pollen: Secondary | ICD-10-CM | POA: Diagnosis not present

## 2021-04-01 DIAGNOSIS — R918 Other nonspecific abnormal finding of lung field: Secondary | ICD-10-CM | POA: Diagnosis not present

## 2021-04-01 DIAGNOSIS — J4530 Mild persistent asthma, uncomplicated: Secondary | ICD-10-CM | POA: Diagnosis not present

## 2021-04-01 DIAGNOSIS — J439 Emphysema, unspecified: Secondary | ICD-10-CM | POA: Diagnosis not present

## 2021-04-01 MED ORDER — AMIODARONE HCL 200 MG PO TABS: 100.0000 mg | ORAL_TABLET | Freq: Every day | ORAL | 2 refills | Status: AC

## 2021-04-01 NOTE — Telephone Encounter (Signed)
Patient's wife states she is returning a call from the office. I did not see any notes.

## 2021-04-03 DIAGNOSIS — E785 Hyperlipidemia, unspecified: Secondary | ICD-10-CM | POA: Diagnosis not present

## 2021-04-03 DIAGNOSIS — D649 Anemia, unspecified: Secondary | ICD-10-CM | POA: Diagnosis not present

## 2021-04-03 DIAGNOSIS — E538 Deficiency of other specified B group vitamins: Secondary | ICD-10-CM | POA: Diagnosis not present

## 2021-04-03 DIAGNOSIS — Z6829 Body mass index (BMI) 29.0-29.9, adult: Secondary | ICD-10-CM | POA: Diagnosis not present

## 2021-04-03 DIAGNOSIS — I10 Essential (primary) hypertension: Secondary | ICD-10-CM | POA: Diagnosis not present

## 2021-04-03 DIAGNOSIS — G2581 Restless legs syndrome: Secondary | ICD-10-CM | POA: Diagnosis not present

## 2021-04-05 DIAGNOSIS — Z7982 Long term (current) use of aspirin: Secondary | ICD-10-CM | POA: Diagnosis not present

## 2021-04-05 DIAGNOSIS — F419 Anxiety disorder, unspecified: Secondary | ICD-10-CM | POA: Diagnosis not present

## 2021-04-05 DIAGNOSIS — J9611 Chronic respiratory failure with hypoxia: Secondary | ICD-10-CM | POA: Diagnosis not present

## 2021-04-05 DIAGNOSIS — R531 Weakness: Secondary | ICD-10-CM | POA: Diagnosis not present

## 2021-04-05 DIAGNOSIS — E7800 Pure hypercholesterolemia, unspecified: Secondary | ICD-10-CM | POA: Diagnosis not present

## 2021-04-05 DIAGNOSIS — Z87891 Personal history of nicotine dependence: Secondary | ICD-10-CM | POA: Diagnosis not present

## 2021-04-05 DIAGNOSIS — J439 Emphysema, unspecified: Secondary | ICD-10-CM | POA: Diagnosis not present

## 2021-04-05 DIAGNOSIS — M1990 Unspecified osteoarthritis, unspecified site: Secondary | ICD-10-CM | POA: Diagnosis not present

## 2021-04-05 DIAGNOSIS — N289 Disorder of kidney and ureter, unspecified: Secondary | ICD-10-CM | POA: Diagnosis not present

## 2021-04-05 DIAGNOSIS — Z887 Allergy status to serum and vaccine status: Secondary | ICD-10-CM | POA: Diagnosis not present

## 2021-04-05 DIAGNOSIS — I10 Essential (primary) hypertension: Secondary | ICD-10-CM | POA: Diagnosis not present

## 2021-04-05 DIAGNOSIS — N179 Acute kidney failure, unspecified: Secondary | ICD-10-CM | POA: Diagnosis not present

## 2021-04-05 DIAGNOSIS — F0390 Unspecified dementia without behavioral disturbance: Secondary | ICD-10-CM | POA: Diagnosis not present

## 2021-04-05 DIAGNOSIS — I358 Other nonrheumatic aortic valve disorders: Secondary | ICD-10-CM | POA: Diagnosis not present

## 2021-04-05 DIAGNOSIS — Z20822 Contact with and (suspected) exposure to covid-19: Secondary | ICD-10-CM | POA: Diagnosis not present

## 2021-04-05 DIAGNOSIS — F32A Depression, unspecified: Secondary | ICD-10-CM | POA: Diagnosis not present

## 2021-04-05 DIAGNOSIS — I252 Old myocardial infarction: Secondary | ICD-10-CM | POA: Diagnosis not present

## 2021-04-05 DIAGNOSIS — I482 Chronic atrial fibrillation, unspecified: Secondary | ICD-10-CM | POA: Diagnosis not present

## 2021-04-05 DIAGNOSIS — J432 Centrilobular emphysema: Secondary | ICD-10-CM | POA: Diagnosis not present

## 2021-04-05 DIAGNOSIS — Z7901 Long term (current) use of anticoagulants: Secondary | ICD-10-CM | POA: Diagnosis not present

## 2021-04-05 DIAGNOSIS — J441 Chronic obstructive pulmonary disease with (acute) exacerbation: Secondary | ICD-10-CM | POA: Diagnosis not present

## 2021-04-05 DIAGNOSIS — I2510 Atherosclerotic heart disease of native coronary artery without angina pectoris: Secondary | ICD-10-CM | POA: Diagnosis not present

## 2021-04-05 DIAGNOSIS — R0689 Other abnormalities of breathing: Secondary | ICD-10-CM | POA: Diagnosis not present

## 2021-04-05 DIAGNOSIS — Z8673 Personal history of transient ischemic attack (TIA), and cerebral infarction without residual deficits: Secondary | ICD-10-CM | POA: Diagnosis not present

## 2021-04-05 DIAGNOSIS — Z79899 Other long term (current) drug therapy: Secondary | ICD-10-CM | POA: Diagnosis not present

## 2021-04-05 DIAGNOSIS — Z885 Allergy status to narcotic agent status: Secondary | ICD-10-CM | POA: Diagnosis not present

## 2021-04-05 DIAGNOSIS — I129 Hypertensive chronic kidney disease with stage 1 through stage 4 chronic kidney disease, or unspecified chronic kidney disease: Secondary | ICD-10-CM | POA: Diagnosis not present

## 2021-04-05 DIAGNOSIS — K449 Diaphragmatic hernia without obstruction or gangrene: Secondary | ICD-10-CM | POA: Diagnosis not present

## 2021-04-08 DIAGNOSIS — J439 Emphysema, unspecified: Secondary | ICD-10-CM | POA: Diagnosis not present

## 2021-04-09 DIAGNOSIS — N179 Acute kidney failure, unspecified: Secondary | ICD-10-CM | POA: Diagnosis not present

## 2021-04-09 DIAGNOSIS — J441 Chronic obstructive pulmonary disease with (acute) exacerbation: Secondary | ICD-10-CM | POA: Diagnosis not present

## 2021-04-09 DIAGNOSIS — Z9981 Dependence on supplemental oxygen: Secondary | ICD-10-CM | POA: Diagnosis not present

## 2021-04-09 DIAGNOSIS — Z515 Encounter for palliative care: Secondary | ICD-10-CM | POA: Diagnosis not present

## 2021-04-11 ENCOUNTER — Ambulatory Visit: Payer: PPO | Admitting: Neurology

## 2021-04-11 ENCOUNTER — Encounter: Payer: Self-pay | Admitting: Neurology

## 2021-04-11 VITALS — BP 119/79 | HR 80

## 2021-04-11 DIAGNOSIS — F01B18 Vascular dementia, moderate, with other behavioral disturbance: Secondary | ICD-10-CM | POA: Diagnosis not present

## 2021-04-11 DIAGNOSIS — R269 Unspecified abnormalities of gait and mobility: Secondary | ICD-10-CM

## 2021-04-11 DIAGNOSIS — R443 Hallucinations, unspecified: Secondary | ICD-10-CM

## 2021-04-11 DIAGNOSIS — H469 Unspecified optic neuritis: Secondary | ICD-10-CM

## 2021-04-11 DIAGNOSIS — G2581 Restless legs syndrome: Secondary | ICD-10-CM | POA: Diagnosis not present

## 2021-04-11 DIAGNOSIS — I639 Cerebral infarction, unspecified: Secondary | ICD-10-CM | POA: Diagnosis not present

## 2021-04-11 MED ORDER — DONEPEZIL HCL 10 MG PO TABS: 10.0000 mg | ORAL_TABLET | Freq: Every day | ORAL | 3 refills | Status: AC

## 2021-04-11 MED ORDER — MEMANTINE HCL 10 MG PO TABS: 10.0000 mg | ORAL_TABLET | Freq: Two times a day (BID) | ORAL | 3 refills | Status: AC

## 2021-04-11 MED ORDER — ROPINIROLE HCL 1 MG PO TABS: 1.0000 mg | ORAL_TABLET | Freq: Three times a day (TID) | ORAL | 3 refills | Status: AC

## 2021-04-11 NOTE — Patient Instructions (Addendum)
Continue Namenda 10 mg twice daily Increase Aricept to 10 mg nightly Continue all your other medications Follow with your primary care doctor Return in 1 year or sooner if worse.

## 2021-04-11 NOTE — Progress Notes (Signed)
[facility]  PATIENT: Chris Braun DOB: [birth year]  REQUESTING CLINICIAN: Penelope Coop, FNP HISTORY FROM: Patient, mostly wife  REASON FOR VISIT: Vascular dementia    HISTORICAL  CHIEF COMPLAINT:  Chief Complaint  Patient presents with   Consult    Room 23 w/ wife, Arville Go and son, Sherren Mocha. Dementia follow up. Transitioning care from Dr. Jannifer Franklin.    INTERVAL HISTORY 04/11/2021 Patient presents today for follow-up, he is accompanied by his wife and son.  He has vascular dementia and is already on Namenda 10 mg twice daily and Aricept 5 mg nightly.  Reported that his memory is not what it used to be, wife reported he is getting worse.  He was recently admitted to the hospital for emphysema, discharged on home oxygen.  Discharge summary not available for review but wife reports since discharge he has been using the supplemental O2, he is physically declining, not able to stand or walk anymore using a walker using a wheelchair.  Memory also get worse.    HISTORY OF PRESENT ILLNESS:  Chris Braun is an 86 year old right-handed white male with a history of vascular dementia.  The patient has extensive white matter changes in the brain and cortical atrophy.  He has a chronic gait disorder and memory disorder as a result of this.  He has a history of atrial fibrillation, he is on anticoagulation and taking low-dose aspirin.  The patient is followed through ophthalmology, they have noted evolution of bilateral papilledema.  Blood work has been done with a minimally elevated C-reactive protein of 1.2, sedimentation rate was 12.  He has been in the hospital on 16 August 2020 for a work-up of the papilledema.  The patient underwent MRI of the brain that shows a small acute left cerebellar stroke, generalized cortical atrophy was seen.  MRI of the orbits was normal.  The patient was kept on anticoagulant therapy and aspirin.  He eventually underwent a lumbar puncture on 21 August 2020 with  an opening pressure of 9 cm.  The spinal tap was traumatic with 12,000 white blood cells, the protein was 87 correcting to 70 for the elevation in red blood cells but the protein is still slightly elevated.  The glucose was normal at 58.  No evidence of an infection was noted.  The patient has had some recent decline in his ability to ambulate, he is now getting physical therapy.  He has not had any falls since December 2021.  The wife indicates that he is having some occasional hallucinations but this is not agitating him.  He is eating fairly well.  He is waking up in the middle the night and having trouble getting back to sleep.  The patient will walk with a walker.  He has some trouble elevating the right arm due to a right shoulder issue.  He has a history of restless leg syndrome as well.  The optic edema apparently was worse on the left than the right.  He is sent here for further evaluation.  The patient has apparently had some decline in vision, he reports no headaches, numbness or new weakness.  He denies any dizziness.  The wife believes that the use of Namenda has made him more alert and cognitively focused.  He is not on a full dose of the Namenda, he only takes 5 mg twice daily.    Functional status:  Patient lives with wife  Cooking: wife Cleaning: wife Shopping: wife  Bathing: wife helps  Toileting: wife helps  Driving: not driving  Bills: wife Ever left the stove on by accident?: n/a Forget how to use items around the house?: yes Getting lost going to familiar places?: yes  Forgetting loved ones names?: yes, sometimes call names of people who are dead  Word finding difficulty? Yes  Sleep: Wakes up a lot during sleep    OTHER MEDICAL CONDITIONS: reviewed   REVIEW OF SYSTEMS: Full 14 system review of systems performed and negative with exception of: as noted in the HPI   ALLERGIES: Allergies  Allergen Reactions   Other Nausea And Vomiting and Other (See Comments)     pneumonia vaccine- chills, vomiting, fever, lost use of legs and body function. Had a fall post inj. (05/25/2013)   Pneumococcal Polysaccharide Vaccine Nausea And Vomiting and Other (See Comments)    Chills, loss of bodily functions, ended up in the ED Other reaction(s): GI Upset (intolerance) Chills, loss of bodily functions, ended up in the ED   Hydrocodone-Acetaminophen Other (See Comments)    Must have Zofran to tolerate this Must have Zofran to tolerate this   Ibuprofen Other (See Comments)    Heart doctor advised he cannot take this   Oxycodone Other (See Comments)    Mental status changes and causes sleepwalking Mental status changes Mental status changes and causes sleepwalking    HOME MEDICATIONS: Outpatient Medications Prior to Visit  Medication Sig Dispense Refill   acetaminophen (TYLENOL) 650 MG CR tablet Take 1,300 mg by mouth every 8 (eight) hours as needed for pain.     amiodarone (PACERONE) 200 MG tablet Take 0.5 tablets (100 mg total) by mouth daily. 45 tablet 2   apixaban (ELIQUIS) 5 MG TABS tablet Take 1 tablet (5 mg total) by mouth 2 (two) times daily. Holding for spinal tap on 08/21/2020.  May resume after 8 hours after the spinal tap.     aspirin EC 81 MG tablet Take 1 tablet (81 mg total) by mouth at bedtime. Holding for spinal tap on 08/21/2020.  May resume after 8 hours after the spinal tap.     atorvastatin (LIPITOR) 80 MG tablet Take 80 mg by mouth at bedtime.     benzonatate (TESSALON) 200 MG capsule Take 200 mg by mouth 3 (three) times daily as needed for cough.     calcitonin, salmon, (MIACALCIN/FORTICAL) 200 UNIT/ACT nasal spray Place 1 spray into alternate nostrils at bedtime.     Calcium Carb-Cholecalciferol (CALCIUM-VITAMIN D3) 600-200 MG-UNIT TABS Take 600 mg by mouth 2 (two) times daily.     digoxin (LANOXIN) 0.125 MG tablet Take 1 tablet (125 mcg total) by mouth every other day. 45 tablet 1   docusate sodium (COLACE) 100 MG capsule Take 100 mg by mouth at  bedtime.     Ferrous Gluconate (IRON 27 PO) Take 27 mg by mouth every evening.     furosemide (LASIX) 20 MG tablet Take 20 mg by mouth daily as needed for fluid.     Guaifenesin (MUCINEX MAXIMUM STRENGTH) 1200 MG TB12 Take 1,200 mg by mouth in the morning and at bedtime.     ipratropium-albuterol (DUONEB) 0.5-2.5 (3) MG/3ML SOLN Take 3 mLs by nebulization 2 (two) times daily.      ketoconazole (NIZORAL) 2 % cream Apply 1 application topically daily as needed for irritation (on feet). Apply as needed to affected area.       lactulose (CHRONULAC) 10 GM/15ML solution Take 10 g by mouth at bedtime as needed for mild constipation.  loperamide (IMODIUM) 2 MG capsule Take 2-4 mg by mouth as needed for diarrhea or loose stools.     loratadine (CLARITIN) 10 MG tablet Take 10 mg by mouth in the morning.      Lutein 20 MG TABS Take 20 mg by mouth daily.     methocarbamol (ROBAXIN) 500 MG tablet Take 500 mg by mouth 3 (three) times daily as needed for pain.     montelukast (SINGULAIR) 10 MG tablet Take 10 mg by mouth daily.      Multiple Vitamins-Minerals (MULTIVITAMIN PO) Take 1 tablet by mouth every evening.      nitroGLYCERIN (NITROSTAT) 0.4 MG SL tablet DISSOLVE 1 TABLET UNDER THE TONGUE EVERY 5 MINUTES AS NEEDED FOR CHEST PAIN. DO NOT EXCEED A TOTAL OF 3 DOSES IN 15 MINUTES. 25 tablet 0   ondansetron (ZOFRAN-ODT) 4 MG disintegrating tablet Take 4 mg by mouth every 8 (eight) hours as needed for nausea or vomiting.      pantoprazole (PROTONIX) 40 MG tablet Take 1 tablet (40 mg total) by mouth 2 (two) times daily. 60 tablet 0   potassium chloride (KLOR-CON) 10 MEQ tablet Take 10 mEq by mouth daily as needed for other (when taking furosemide). When taking Lasix     QUEtiapine (SEROQUEL) 25 MG tablet Take 25 mg by mouth at bedtime.     SYMBICORT 160-4.5 MCG/ACT inhaler Inhale 2 puffs into the lungs 2 (two) times daily.  12   tamsulosin (FLOMAX) 0.4 MG CAPS capsule Take 0.4 mg by mouth daily after supper.      vitamin B-12 (CYANOCOBALAMIN) 1000 MCG tablet Take 1,000 mcg by mouth daily.     donepezil (ARICEPT) 5 MG tablet Take 5 mg by mouth at bedtime.      memantine (NAMENDA) 10 MG tablet Take 1 tablet (10 mg total) by mouth 2 (two) times daily. 180 tablet 3   rOPINIRole (REQUIP) 1 MG tablet Take 1 tablet (1 mg total) by mouth 3 (three) times daily. 90 tablet 1   No facility-administered medications prior to visit.    PAST MEDICAL HISTORY: Past Medical History:  Diagnosis Date   Acute hypoxemic respiratory failure ([place]) 05/27/2018   Acute ischemic stroke ([place]) 08/17/2020   AF (paroxysmal atrial fibrillation) ([place]) 08/17/2020   AKI (acute kidney injury) ([place]) 05/27/2018   Aspiration into airway    Asthma    Atrial tachycardia ([place]) 07/26/2018   B12 deficiency    Bilateral optic neuropathy    CAD (coronary artery disease) 08/24/2014   Carotid stenosis, bilateral 08/24/2014   Cataracts, bilateral    Compression fracture of L2 lumbar vertebra, closed, initial encounter ([place]) 10/27/2019   COPD (chronic obstructive pulmonary disease) ([place])    Depression    Diverticulosis    per colonscopy   Dyslipidemia 08/24/2014   Falls frequently 06/03/2015   Food impaction of esophagus    Gait abnormality 01/19/2018   GERD (gastroesophageal reflux disease)    Hiatal hernia    High cholesterol    Hypertension    Hypoxia    Idiopathic progressive polyneuropathy    Kidney stones    Lumbar vertebral fracture, pathologic 10/27/2019   Macular degeneration    Memory disorder 01/19/2018   Myocardial infarction [facility]) 2002   Oculomotor nerve palsy, 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   Osteoporosis    Renal insufficiency 05/27/2018   Right kidney stone 05/27/2018   Right upper lobe pneumonia 05/27/2018   Risk for falls 05/28/2015   RLS (restless legs syndrome) 04/05/2020  Status post ablation of ventricular arrhythmia 04/27/2018   Stroke (cerebrum) ([place]) 01/17/2014   Stroke ([place])    SVT (supraventricular tachycardia) ([place]) 12/09/2019    Weakness of distal arms and legs 06/03/2015    PAST SURGICAL HISTORY: Past Surgical History:  Procedure Laterality Date   CARDIAC CATHETERIZATION  2002,   stents    CATARACT EXTRACTION Left 2016   Colonscopy  2012   CYSTOSCOPY W/ URETERAL STENT PLACEMENT Right 05/30/2018   Procedure: CYSTOSCOPY WITH RETROGRADE PYELOGRAM/URETERAL STENT PLACEMENT;  Surgeon: Irine Seal, MD;  Location: [place];  Service: Urology;  Laterality: Right;   ESOPHAGOGASTRODUODENOSCOPY (EGD) WITH PROPOFOL N/A 02/10/2020   Procedure: ESOPHAGOGASTRODUODENOSCOPY (EGD) WITH PROPOFOL;  Surgeon: Yetta Flock, MD;  Location: [place];  Service: Gastroenterology;  Laterality: N/A;   FOREIGN BODY REMOVAL  02/10/2020   Procedure: FOREIGN BODY REMOVAL;  Surgeon: Yetta Flock, MD;  Location: [facility] ENDOSCOPY;  Service: Gastroenterology;;   HIP FRACTURE SURGERY  2008   right   HYDROCELE EXCISION  10/12/2014   LITHOTRIPSY     2007, 2009, 2010, 2011, 2012, 2013   LUMBAR LAMINECTOMY/DECOMPRESSION MICRODISCECTOMY  03/15/2012   Procedure: LUMBAR LAMINECTOMY/DECOMPRESSION MICRODISCECTOMY 1 LEVEL;  Surgeon: Elaina Hoops, MD;  Location: [place] [facility];  Service: Neurosurgery;  Laterality: Left;  Left lumbar three-four decompressive lumbar laminectomy, discectomy   MAXIMUM ACCESS (MAS)POSTERIOR LUMBAR INTERBODY FUSION (PLIF) 2 LEVEL N/A 12/08/2012   Procedure: FOR MAXIMUM ACCESS (MAS) POSTERIOR LUMBAR INTERBODY FUSION (PLIF) 2 LEVEL;  Surgeon: Elaina Hoops, MD;  Location: [place] [facility];  Service: Neurosurgery;  Laterality: N/A;  FOR MAXIMUM ACCESS (MAS) POSTERIOR LUMBAR INTERBODY FUSION (PLIF) 2 LEVEL   ROTATOR CUFF REPAIR  1999   bil    SALIVARY STONE REMOVAL  1964   1964   TOTAL HIP ARTHROPLASTY Right 2008   VT study     with Ablation    FAMILY HISTORY: Family History  Problem Relation Age of Onset   Hemolytic uremic syndrome Mother    Heart disease Mother    Stroke Father    Heart disease Father    Heart disease  Sister    Heart disease Sister    Heart disease Sister    Alzheimer's disease Sister     SOCIAL HISTORY: Social History   Socioeconomic History   Marital status: Married    Spouse name: Froylan Hobby   Number of children: 4   Years of education: 12   Highest education level: Not on file  Occupational History   Occupation: Retired  Tobacco Use   Smoking status: Former    Packs/day: 1.00    Years: 45.00    Pack years: 45.00    Types: Cigarettes    Quit date: 02/02/2001    Years since quitting: 20.2   Smokeless tobacco: Current    Types: Chew  Vaping Use   Vaping Use: Never used  Substance and Sexual Activity   Alcohol use: No   Drug use: No   Sexual activity: Not on file  Other Topics Concern   Not on file  Social History Narrative   Lives with wife   Caffeine use: coffee, tea   Social Determinants of Health   Financial Resource Strain: Not on file  Food Insecurity: Not on file  Transportation Needs: Not on file  Physical Activity: Not on file  Stress: Not on file  Social Connections: Not on file  Intimate Partner Violence: Not on file    PHYSICAL EXAM  GENERAL EXAM/CONSTITUTIONAL: Vitals:  Vitals:   04/11/21 1203  BP: 119/79  Pulse: 80   There is no height or weight on file to calculate BMI. Wt Readings from Last 3 Encounters:  12/17/20 174 lb 9.6 oz (79.2 kg)  09/05/20 164 lb 12.8 oz (74.8 kg)  09/04/20 165 lb (74.8 kg)   Patient is in no distress; well developed, nourished and groomed; neck is supple  CARDIOVASCULAR: Examination of carotid arteries is normal; no carotid bruits Regular rate and rhythm, no murmurs Examination of peripheral vascular system by observation and palpation is normal  EYES: Pupils round and reactive to light, Visual fields full to confrontation, Extraocular movements intacts,   MUSCULOSKELETAL: Gait, strength, tone, movements noted in Neurologic exam below  NEUROLOGIC: MENTAL STATUS:  MMSE - [place]  Exam 04/11/2021 04/05/2020 01/19/2018  Orientation to time 3 2 3   Orientation to Place 5 4 3   Registration 2 3 3   Attention/ Calculation 1 1 2   Recall 0 0 1  Language- name 2 objects 2 2 2   Language- repeat 1 0 1  Language- follow 3 step command 3 3 3   Language- read & follow direction 1 1 1   Write a sentence 1 1 1   Copy design 0 0 1  Copy design-comments Four legged animals x one min: 4 - -  Total score 19 17 21     CRANIAL NERVE:  2nd, 3rd, 4th, 6th - pupils equal and reactive to light, visual fields full to confrontation, extraocular muscles intact, no nystagmus 5th - facial sensation symmetric 7th - facial strength symmetric 8th - hearing intact 9th - palate elevates symmetrically, uvula midline 11th - shoulder shrug symmetric 12th - tongue protrusion midline  MOTOR:  normal bulk and tone, at least antigravity in the BUE and BLEs.   SENSORY:  normal and symmetric to light touch   DIAGNOSTIC DATA (LABS, IMAGING, TESTING) - I reviewed patient records, labs, notes, testing and imaging myself where available.  Lab Results  Component Value Date   WBC 9.9 08/17/2020   HGB 15.2 08/17/2020   HCT 44.0 08/17/2020   MCV 89.8 08/17/2020   PLT 181 08/17/2020      Component Value Date/Time   NA 132 (L) 08/17/2020 0500   NA 142 06/08/2020 1502   K 4.3 08/17/2020 0500   CL 96 (L) 08/17/2020 0500   CO2 27 08/17/2020 0500   GLUCOSE 85 08/17/2020 0500   BUN 10 08/17/2020 0500   BUN 9 06/08/2020 1502   CREATININE 1.24 08/17/2020 0500   CALCIUM 8.7 (L) 08/17/2020 0500   PROT 6.5 08/16/2020 1815   ALBUMIN 3.3 (L) 08/16/2020 1815   AST 30 08/16/2020 1815   ALT 19 08/16/2020 1815   ALKPHOS 87 08/16/2020 1815   BILITOT 0.7 08/16/2020 1815   GFRNONAA 58 (L) 08/17/2020 0500   GFRAA >60 10/31/2019 0420   Lab Results  Component Value Date   CHOL 177 08/17/2020   HDL 44 08/17/2020   LDLCALC 104 (H) 08/17/2020   TRIG 145 08/17/2020   CHOLHDL 4.0 08/17/2020   Lab Results   Component Value Date   HGBA1C 5.6 08/17/2020   Lab Results  Component Value Date   VITAMINB12 >2000 (H) 10/07/2017   Lab Results  Component Value Date   TSH 4.192 08/17/2020    MRI Brain 08/16/2020 Small focus of abnormal diffusion restriction in the left cerebellum. Chronic microhemorrhage in left cerebellum. Hemosiderin deposition within the right cerebellum Confluent hyperintense T2-weighted white matter signal. Diffuse, severe atrophy. Multiple cerebellar  infarcts. There is no abnormal contrast enhancement.   ASSESSMENT AND PLAN  86 y.o. year old male with multiple medical conditions including atrial fibrillation, multiple strokes, respiratory failure, emphysema, vascular dementia, peripheral edema, hallucination who is presenting for follow-up.  For his diagnosis of vascular dementia, he is on Namenda 10 mg twice daily and Aricept 5 mg nightly.  Wife reports that since being discharged from the recent hospitalization with supplemental oxygen his hallucinations have improved.  He remains physically deconditioned, unable to stand or walk, he is on the wheelchair.  At this time we will continue current medication and I will increase his Aricept to 10 mg nightly.  He is now maximized on both Namenda and Aricept.  I will also give him a refill for Requip.  Advised patient to follow-up with your primary care doctor, continue with physical therapy and return in 1 year   1. Moderate vascular dementia with other behavioral disturbance   2. Hallucination   3. RLS (restless legs syndrome)   4. Bilateral optic neuropathy   5. Gait abnormality   6. Cerebrovascular accident (CVA), unspecified mechanism ([place])     Patient Instructions  Continue Namenda 10 mg twice daily Increase Aricept to 10 mg nightly Continue all your other medications Follow with your primary care doctor Return in 1 year or sooner if worse.  No orders of the defined types were placed in this encounter.   Meds ordered  this encounter  Medications   donepezil (ARICEPT) 10 MG tablet    Sig: Take 1 tablet (10 mg total) by mouth at bedtime.    Dispense:  90 tablet    Refill:  3   memantine (NAMENDA) 10 MG tablet    Sig: Take 1 tablet (10 mg total) by mouth 2 (two) times daily.    Dispense:  180 tablet    Refill:  3   rOPINIRole (REQUIP) 1 MG tablet    Sig: Take 1 tablet (1 mg total) by mouth 3 (three) times daily.    Dispense:  270 tablet    Refill:  3    Return in about 1 year (around 04/11/2022).   I have spent a total of 33 minutes dedicated to this patient today, preparing to see patient, examining the patient, ordering tests and/or medications, and counseling the patient including review of tests; performing a medically appropriate examination and evaluation; ordering medication, test, and procedures; counseling and educating the patient/family/caregiver; independent independently interpreting result and communicating results to the family/patient/caregiver; and documenting clinical information in the electronic medical record.    Alric Ran, MD 04/11/2021, 1:20 PM  [facility] [address] [place], [place] [address] [phone]

## 2021-04-12 DIAGNOSIS — J301 Allergic rhinitis due to pollen: Secondary | ICD-10-CM | POA: Diagnosis not present

## 2021-04-12 DIAGNOSIS — R918 Other nonspecific abnormal finding of lung field: Secondary | ICD-10-CM | POA: Diagnosis not present

## 2021-04-12 DIAGNOSIS — J4530 Mild persistent asthma, uncomplicated: Secondary | ICD-10-CM | POA: Diagnosis not present

## 2021-04-12 DIAGNOSIS — J439 Emphysema, unspecified: Secondary | ICD-10-CM | POA: Diagnosis not present

## 2021-04-15 DIAGNOSIS — N179 Acute kidney failure, unspecified: Secondary | ICD-10-CM | POA: Diagnosis not present

## 2021-04-15 DIAGNOSIS — B353 Tinea pedis: Secondary | ICD-10-CM | POA: Diagnosis not present

## 2021-04-15 DIAGNOSIS — M810 Age-related osteoporosis without current pathological fracture: Secondary | ICD-10-CM | POA: Diagnosis not present

## 2021-04-15 DIAGNOSIS — H532 Diplopia: Secondary | ICD-10-CM | POA: Diagnosis not present

## 2021-04-15 DIAGNOSIS — H4710 Unspecified papilledema: Secondary | ICD-10-CM | POA: Diagnosis not present

## 2021-04-15 DIAGNOSIS — K449 Diaphragmatic hernia without obstruction or gangrene: Secondary | ICD-10-CM | POA: Diagnosis not present

## 2021-04-15 DIAGNOSIS — I2510 Atherosclerotic heart disease of native coronary artery without angina pectoris: Secondary | ICD-10-CM | POA: Diagnosis not present

## 2021-04-15 DIAGNOSIS — J45909 Unspecified asthma, uncomplicated: Secondary | ICD-10-CM | POA: Diagnosis not present

## 2021-04-15 DIAGNOSIS — E7800 Pure hypercholesterolemia, unspecified: Secondary | ICD-10-CM | POA: Diagnosis not present

## 2021-04-15 DIAGNOSIS — H02401 Unspecified ptosis of right eyelid: Secondary | ICD-10-CM | POA: Diagnosis not present

## 2021-04-15 DIAGNOSIS — G9341 Metabolic encephalopathy: Secondary | ICD-10-CM | POA: Diagnosis not present

## 2021-04-15 DIAGNOSIS — I252 Old myocardial infarction: Secondary | ICD-10-CM | POA: Diagnosis not present

## 2021-04-15 DIAGNOSIS — J189 Pneumonia, unspecified organism: Secondary | ICD-10-CM | POA: Diagnosis not present

## 2021-04-15 DIAGNOSIS — I700 Atherosclerosis of aorta: Secondary | ICD-10-CM | POA: Diagnosis not present

## 2021-04-15 DIAGNOSIS — J439 Emphysema, unspecified: Secondary | ICD-10-CM | POA: Diagnosis not present

## 2021-04-15 DIAGNOSIS — A419 Sepsis, unspecified organism: Secondary | ICD-10-CM | POA: Diagnosis not present

## 2021-04-15 DIAGNOSIS — I129 Hypertensive chronic kidney disease with stage 1 through stage 4 chronic kidney disease, or unspecified chronic kidney disease: Secondary | ICD-10-CM | POA: Diagnosis not present

## 2021-04-15 DIAGNOSIS — E538 Deficiency of other specified B group vitamins: Secondary | ICD-10-CM | POA: Diagnosis not present

## 2021-04-15 DIAGNOSIS — M5126 Other intervertebral disc displacement, lumbar region: Secondary | ICD-10-CM | POA: Diagnosis not present

## 2021-04-15 DIAGNOSIS — N1831 Chronic kidney disease, stage 3a: Secondary | ICD-10-CM | POA: Diagnosis not present

## 2021-04-15 DIAGNOSIS — J9601 Acute respiratory failure with hypoxia: Secondary | ICD-10-CM | POA: Diagnosis not present

## 2021-04-15 DIAGNOSIS — D631 Anemia in chronic kidney disease: Secondary | ICD-10-CM | POA: Diagnosis not present

## 2021-04-15 DIAGNOSIS — E46 Unspecified protein-calorie malnutrition: Secondary | ICD-10-CM | POA: Diagnosis not present

## 2021-04-15 DIAGNOSIS — M1990 Unspecified osteoarthritis, unspecified site: Secondary | ICD-10-CM | POA: Diagnosis not present

## 2021-04-17 DIAGNOSIS — Z6829 Body mass index (BMI) 29.0-29.9, adult: Secondary | ICD-10-CM | POA: Diagnosis not present

## 2021-04-17 DIAGNOSIS — Z09 Encounter for follow-up examination after completed treatment for conditions other than malignant neoplasm: Secondary | ICD-10-CM | POA: Diagnosis not present

## 2021-04-17 DIAGNOSIS — R059 Cough, unspecified: Secondary | ICD-10-CM | POA: Diagnosis not present

## 2021-04-17 DIAGNOSIS — N179 Acute kidney failure, unspecified: Secondary | ICD-10-CM | POA: Diagnosis not present

## 2021-04-17 DIAGNOSIS — N189 Chronic kidney disease, unspecified: Secondary | ICD-10-CM | POA: Diagnosis not present

## 2021-04-17 DIAGNOSIS — Z79899 Other long term (current) drug therapy: Secondary | ICD-10-CM | POA: Diagnosis not present

## 2021-04-17 DIAGNOSIS — J439 Emphysema, unspecified: Secondary | ICD-10-CM | POA: Diagnosis not present

## 2021-04-17 DIAGNOSIS — J9601 Acute respiratory failure with hypoxia: Secondary | ICD-10-CM | POA: Diagnosis not present

## 2021-04-22 DIAGNOSIS — J4530 Mild persistent asthma, uncomplicated: Secondary | ICD-10-CM | POA: Diagnosis not present

## 2021-04-22 DIAGNOSIS — R918 Other nonspecific abnormal finding of lung field: Secondary | ICD-10-CM | POA: Diagnosis not present

## 2021-04-22 DIAGNOSIS — J301 Allergic rhinitis due to pollen: Secondary | ICD-10-CM | POA: Diagnosis not present

## 2021-04-22 DIAGNOSIS — J439 Emphysema, unspecified: Secondary | ICD-10-CM | POA: Diagnosis not present

## 2021-05-06 DIAGNOSIS — J439 Emphysema, unspecified: Secondary | ICD-10-CM | POA: Diagnosis not present

## 2021-05-08 DIAGNOSIS — Z515 Encounter for palliative care: Secondary | ICD-10-CM | POA: Diagnosis not present

## 2021-05-08 DIAGNOSIS — M810 Age-related osteoporosis without current pathological fracture: Secondary | ICD-10-CM | POA: Diagnosis not present

## 2021-05-08 DIAGNOSIS — F0390 Unspecified dementia without behavioral disturbance: Secondary | ICD-10-CM | POA: Diagnosis not present

## 2021-05-08 DIAGNOSIS — J439 Emphysema, unspecified: Secondary | ICD-10-CM | POA: Diagnosis not present

## 2021-05-08 DIAGNOSIS — R5381 Other malaise: Secondary | ICD-10-CM | POA: Diagnosis not present

## 2021-05-08 DIAGNOSIS — I714 Abdominal aortic aneurysm, without rupture, unspecified: Secondary | ICD-10-CM | POA: Diagnosis not present

## 2021-05-08 DIAGNOSIS — K5792 Diverticulitis of intestine, part unspecified, without perforation or abscess without bleeding: Secondary | ICD-10-CM | POA: Diagnosis not present

## 2021-05-08 DIAGNOSIS — H3530 Unspecified macular degeneration: Secondary | ICD-10-CM | POA: Diagnosis not present

## 2021-05-15 DIAGNOSIS — M810 Age-related osteoporosis without current pathological fracture: Secondary | ICD-10-CM | POA: Diagnosis not present

## 2021-05-15 DIAGNOSIS — J439 Emphysema, unspecified: Secondary | ICD-10-CM | POA: Diagnosis not present

## 2021-05-15 DIAGNOSIS — N179 Acute kidney failure, unspecified: Secondary | ICD-10-CM | POA: Diagnosis not present

## 2021-05-15 DIAGNOSIS — H532 Diplopia: Secondary | ICD-10-CM | POA: Diagnosis not present

## 2021-05-15 DIAGNOSIS — M5126 Other intervertebral disc displacement, lumbar region: Secondary | ICD-10-CM | POA: Diagnosis not present

## 2021-05-15 DIAGNOSIS — J9601 Acute respiratory failure with hypoxia: Secondary | ICD-10-CM | POA: Diagnosis not present

## 2021-05-15 DIAGNOSIS — H02401 Unspecified ptosis of right eyelid: Secondary | ICD-10-CM | POA: Diagnosis not present

## 2021-05-15 DIAGNOSIS — J45909 Unspecified asthma, uncomplicated: Secondary | ICD-10-CM | POA: Diagnosis not present

## 2021-05-15 DIAGNOSIS — D631 Anemia in chronic kidney disease: Secondary | ICD-10-CM | POA: Diagnosis not present

## 2021-05-15 DIAGNOSIS — E7800 Pure hypercholesterolemia, unspecified: Secondary | ICD-10-CM | POA: Diagnosis not present

## 2021-05-15 DIAGNOSIS — A419 Sepsis, unspecified organism: Secondary | ICD-10-CM | POA: Diagnosis not present

## 2021-05-15 DIAGNOSIS — J189 Pneumonia, unspecified organism: Secondary | ICD-10-CM | POA: Diagnosis not present

## 2021-05-15 DIAGNOSIS — E538 Deficiency of other specified B group vitamins: Secondary | ICD-10-CM | POA: Diagnosis not present

## 2021-05-15 DIAGNOSIS — H4710 Unspecified papilledema: Secondary | ICD-10-CM | POA: Diagnosis not present

## 2021-05-15 DIAGNOSIS — I700 Atherosclerosis of aorta: Secondary | ICD-10-CM | POA: Diagnosis not present

## 2021-05-15 DIAGNOSIS — I252 Old myocardial infarction: Secondary | ICD-10-CM | POA: Diagnosis not present

## 2021-05-15 DIAGNOSIS — K449 Diaphragmatic hernia without obstruction or gangrene: Secondary | ICD-10-CM | POA: Diagnosis not present

## 2021-05-15 DIAGNOSIS — N1831 Chronic kidney disease, stage 3a: Secondary | ICD-10-CM | POA: Diagnosis not present

## 2021-05-15 DIAGNOSIS — G9341 Metabolic encephalopathy: Secondary | ICD-10-CM | POA: Diagnosis not present

## 2021-05-15 DIAGNOSIS — I2510 Atherosclerotic heart disease of native coronary artery without angina pectoris: Secondary | ICD-10-CM | POA: Diagnosis not present

## 2021-05-15 DIAGNOSIS — I129 Hypertensive chronic kidney disease with stage 1 through stage 4 chronic kidney disease, or unspecified chronic kidney disease: Secondary | ICD-10-CM | POA: Diagnosis not present

## 2021-05-15 DIAGNOSIS — B353 Tinea pedis: Secondary | ICD-10-CM | POA: Diagnosis not present

## 2021-05-15 DIAGNOSIS — M1990 Unspecified osteoarthritis, unspecified site: Secondary | ICD-10-CM | POA: Diagnosis not present

## 2021-05-15 DIAGNOSIS — E46 Unspecified protein-calorie malnutrition: Secondary | ICD-10-CM | POA: Diagnosis not present

## 2021-05-21 ENCOUNTER — Ambulatory Visit: Payer: PPO | Admitting: Neurology

## 2021-05-29 ENCOUNTER — Other Ambulatory Visit: Payer: Self-pay | Admitting: Cardiology

## 2021-05-29 NOTE — Telephone Encounter (Signed)
Prescription refill request for Eliquis received. ?Indication:Afib ?Last office visit:10/22 ?Scr:1.3 ?Age: 86 ?Weight:79.2 kg ? ?Prescription refilled ? ?

## 2021-06-06 DIAGNOSIS — J439 Emphysema, unspecified: Secondary | ICD-10-CM | POA: Diagnosis not present

## 2021-06-11 DIAGNOSIS — J439 Emphysema, unspecified: Secondary | ICD-10-CM | POA: Diagnosis not present

## 2021-06-11 DIAGNOSIS — J301 Allergic rhinitis due to pollen: Secondary | ICD-10-CM | POA: Diagnosis not present

## 2021-06-11 DIAGNOSIS — J4530 Mild persistent asthma, uncomplicated: Secondary | ICD-10-CM | POA: Diagnosis not present

## 2021-06-11 DIAGNOSIS — R918 Other nonspecific abnormal finding of lung field: Secondary | ICD-10-CM | POA: Diagnosis not present

## 2021-06-19 ENCOUNTER — Other Ambulatory Visit: Payer: Self-pay

## 2021-06-20 ENCOUNTER — Ambulatory Visit: Payer: PPO | Admitting: Cardiology

## 2021-06-20 ENCOUNTER — Encounter: Payer: Self-pay | Admitting: Cardiology

## 2021-06-20 VITALS — BP 127/71 | HR 55 | Ht 65.0 in | Wt 178.0 lb

## 2021-06-20 DIAGNOSIS — R413 Other amnesia: Secondary | ICD-10-CM

## 2021-06-20 DIAGNOSIS — Z9181 History of falling: Secondary | ICD-10-CM | POA: Diagnosis not present

## 2021-06-20 DIAGNOSIS — I2510 Atherosclerotic heart disease of native coronary artery without angina pectoris: Secondary | ICD-10-CM | POA: Diagnosis not present

## 2021-06-20 DIAGNOSIS — E7800 Pure hypercholesterolemia, unspecified: Secondary | ICD-10-CM

## 2021-06-20 DIAGNOSIS — I480 Paroxysmal atrial fibrillation: Secondary | ICD-10-CM | POA: Diagnosis not present

## 2021-06-20 NOTE — Patient Instructions (Addendum)
Medication Instructions:  ?Your physician has recommended you make the following change in your medication:  ? ?Stop Eliquis ? ?*If you need a refill on your cardiac medications before your next appointment, please call your pharmacy* ? ? ?Lab Work: ?None ordered ?If you have labs (blood work) drawn today and your tests are completely normal, you will receive your results only by: ?MyChart Message (if you have MyChart) OR ?A paper copy in the mail ?If you have any lab test that is abnormal or we need to change your treatment, we will call you to review the results. ? ? ?Testing/Procedures: ?None ordered ? ? ?Follow-Up: ?At [facility], you and your health needs are our priority.  As part of our continuing mission to provide you with exceptional heart care, we have created designated Provider Care Teams.  These Care Teams include your primary Cardiologist (physician) and Advanced Practice Providers (APPs -  Physician Assistants and Nurse Practitioners) who all work together to provide you with the care you need, when you need it. ? ?We recommend signing up for the patient portal called "MyChart".  Sign up information is provided on this After Visit Summary.  MyChart is used to connect with patients for Virtual Visits (Telemedicine).  Patients are able to view lab/test results, encounter notes, upcoming appointments, etc.  Non-urgent messages can be sent to your provider as well.   ?To learn more about what you can do with MyChart, go to [email or web address].   ? ?Your next appointment:   ?9 month(s) ? ?The format for your next appointment:   ?In Person ? ?Provider:   ?Jyl Heinz, MD ? ? ?Other Instructions ?NA ? ?

## 2021-06-20 NOTE — Progress Notes (Signed)
?Cardiology Office Note:   ? ?Date:  06/20/2021  ? ?ID:  Chris Braun, DOB 1935/12/14, MRN [id number] ? ?PCP:  Earlyne Iba, NP  ?Cardiologist:  Jenean Lindau, MD  ? ?Referring MD: Penelope Coop, FNP  ? ? ?ASSESSMENT:   ? ?1. Coronary artery disease involving native coronary artery of native heart without angina pectoris   ?2. PAF (paroxysmal atrial fibrillation) ([place])   ?3. Risk for falls   ?4. Memory disorder   ?5. High cholesterol   ? ?PLAN:   ? ?In order of problems listed above: ? ?Coronary artery disease: Secondary prevention stressed with the patient.  Importance of compliance with diet medication stressed and he vocalized understanding. ?Paroxysmal atrial fibrillation:I discussed with the patient atrial fibrillation, disease process. Management and therapy including rate and rhythm control, anticoagulation benefits and potential risks were discussed extensively with the patient. Patient had multiple questions which were answered to patient's satisfaction.  We are going to discontinue anticoagulation because of high risk of falls.  This was discussed with the patient at length and he vocalized understanding he his wife and son had questions which were answered to their satisfaction. ?Essential hypertension: Blood pressure stable and diet was emphasized. ?Mixed dyslipidemia: On statin therapy and lipids and lab work from primary care reviewed. ?Patient will be seen in follow-up appointment in 6 months or earlier if the patient has any concerns ? ? ? ?Medication Adjustments/Labs and Tests Ordered: ?Current medicines are reviewed at length with the patient today.  Concerns regarding medicines are outlined above.  ?Orders Placed This Encounter  ?Procedures  ? EKG 12-Lead  ? ?No orders of the defined types were placed in this encounter. ? ? ? ?No chief complaint on file. ?  ? ?History of Present Illness:   ? ?Chris Braun is a 86 y.o. male.  Patient has past medical history of coronary artery disease,  paroxysmal atrial fibrillation, essential hypertension mixed dyslipidemia and is essentially wheelchair-bound at this time.  He denies any problems at this time no chest pain orthopnea or PND.  His wife and son accompanies him for this visit.  They mentioned to me that he is very unstable on his gait now and whenever he gets up that he has a tendency to fall down.  No chest pain orthopnea or PND. ? ?Past Medical History:  ?Diagnosis Date  ? Acute hypoxemic respiratory failure ([place]) 05/27/2018  ? Acute ischemic stroke ([place]) 08/17/2020  ? AF (paroxysmal atrial fibrillation) ([place]) 08/17/2020  ? AKI (acute kidney injury) ([place]) 05/27/2018  ? Aspiration into airway   ? Asthma   ? Atrial tachycardia ([employer]) 07/26/2018  ? B12 deficiency   ? Bilateral optic neuropathy   ? CAD (coronary artery disease) 08/24/2014  ? Carotid stenosis, bilateral 08/24/2014  ? Cataracts, bilateral   ? Compression fracture of L2 lumbar vertebra, closed, initial encounter ([place]) 10/27/2019  ? COPD (chronic obstructive pulmonary disease) ([place])   ? Depression   ? Diverticulosis   ? per colonscopy  ? Dyslipidemia 08/24/2014  ? Falls frequently 06/03/2015  ? Food impaction of esophagus   ? Gait abnormality 01/19/2018  ? GERD (gastroesophageal reflux disease)   ? Hiatal hernia   ? High cholesterol   ? Hypertension   ? Hypoxia   ? Idiopathic progressive polyneuropathy   ? Kidney stones   ? Lumbar vertebral fracture, pathologic 10/27/2019  ? Macular degeneration   ? Memory disorder 01/19/2018  ? Myocardial infarction [facility]) 2002  ? 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 Osteoporosis   ? Renal insufficiency 05/27/2018  ? Right kidney stone 05/27/2018  ? Right upper lobe pneumonia 05/27/2018  ? Risk for falls 05/28/2015  ? RLS (restless legs syndrome) 04/05/2020  ? Status post ablation of ventricular arrhythmia 04/27/2018  ? Stroke (cerebrum) ([place]) 01/17/2014  ? Stroke [facility])   ? SVT (supraventricular tachycardia) ([place]) 12/09/2019  ? Weakness of distal arms and legs 06/03/2015   ? ? ?Past Surgical History:  ?Procedure Laterality Date  ? CARDIAC CATHETERIZATION  2002,  ? stents   ? CATARACT EXTRACTION Left 2016  ? Colonscopy  2012  ? CYSTOSCOPY W/ URETERAL STENT PLACEMENT Right 05/30/2018  ? Procedure: CYSTOSCOPY WITH RETROGRADE PYELOGRAM/URETERAL STENT PLACEMENT;  Surgeon: Irine Seal, MD;  Location: [place];  Service: Urology;  Laterality: Right;  ? ESOPHAGOGASTRODUODENOSCOPY (EGD) WITH PROPOFOL N/A 02/10/2020  ? Procedure: ESOPHAGOGASTRODUODENOSCOPY (EGD) WITH PROPOFOL;  Surgeon: Yetta Flock, MD;  Location: [place];  Service: Gastroenterology;  Laterality: N/A;  ? FOREIGN BODY REMOVAL  02/10/2020  ? Procedure: FOREIGN BODY REMOVAL;  Surgeon: Yetta Flock, MD;  Location: [facility] ENDOSCOPY;  Service: Gastroenterology;;  ? [place]  2008  ? right  ? HYDROCELE EXCISION  10/12/2014  ? LITHOTRIPSY    ? 2007, 2009, 2010, 2011, 2012, 2013  ? LUMBAR LAMINECTOMY/DECOMPRESSION MICRODISCECTOMY  03/15/2012  ? Procedure: LUMBAR LAMINECTOMY/DECOMPRESSION MICRODISCECTOMY 1 LEVEL;  Surgeon: Elaina Hoops, MD;  Location: [place] NEURO ORS;  Service: Neurosurgery;  Laterality: Left;  Left lumbar three-four decompressive lumbar laminectomy, discectomy  ? MAXIMUM ACCESS (MAS)POSTERIOR LUMBAR INTERBODY FUSION (PLIF) 2 LEVEL N/A 12/08/2012  ? Procedure: FOR MAXIMUM ACCESS (MAS) POSTERIOR LUMBAR INTERBODY FUSION (PLIF) 2 LEVEL;  Surgeon: Elaina Hoops, MD;  Location: [place] NEURO ORS;  Service: Neurosurgery;  Laterality: N/A;  FOR MAXIMUM ACCESS (MAS) POSTERIOR LUMBAR INTERBODY FUSION (PLIF) 2 LEVEL  ? ROTATOR CUFF REPAIR  1999  ? bil   ? [place]  ? 1964  ? TOTAL HIP ARTHROPLASTY Right 2008  ? VT study    ? with Ablation  ? ? ?Current Medications: ?Current Meds  ?Medication Sig  ? acetaminophen (TYLENOL) 650 MG CR tablet Take 1,300 mg by mouth every 8 (eight) hours as needed for pain.  ? amiodarone (PACERONE) 200 MG tablet Take 0.5 tablets (100 mg total) by mouth daily.  ?  aspirin EC 81 MG tablet Take 1 tablet (81 mg total) by mouth at bedtime. Holding for spinal tap on 08/21/2020.  May resume after 8 hours after the spinal tap.  ? atorvastatin (LIPITOR) 80 MG tablet Take 80 mg by mouth at bedtime.  ? benzonatate (TESSALON) 200 MG capsule Take 200 mg by mouth 3 (three) times daily as needed for cough.  ? calcitonin, salmon, (MIACALCIN/FORTICAL) 200 UNIT/ACT nasal spray Place 1 spray into alternate nostrils at bedtime.  ? Calcium Carb-Cholecalciferol (CALCIUM-VITAMIN D3) 600-200 MG-UNIT TABS Take 600 mg by mouth 2 (two) times daily.  ? digoxin (LANOXIN) 0.125 MG tablet Take 1 tablet (125 mcg total) by mouth every other day.  ? docusate sodium (COLACE) 100 MG capsule Take 100 mg by mouth at bedtime.  ? donepezil (ARICEPT) 10 MG tablet Take 1 tablet (10 mg total) by mouth at bedtime.  ? Ferrous Gluconate (IRON 27 PO) Take 27 mg by mouth every evening.  ? furosemide (LASIX) 20 MG tablet Take 20 mg by mouth daily as needed for fluid.  ? Guaifenesin (MUCINEX MAXIMUM STRENGTH) 1200 MG  TB12 Take 1,200 mg by mouth in the morning and at bedtime.  ? ipratropium-albuterol (DUONEB) 0.5-2.5 (3) MG/3ML SOLN Take 3 mLs by nebulization 2 (two) times daily.   ? ketoconazole (NIZORAL) 2 % cream Apply 1 application topically daily as needed for irritation (on feet). Apply as needed to affected area.    ? lactulose (CHRONULAC) 10 GM/15ML solution Take 10 g by mouth at bedtime as needed for mild constipation.   ? loperamide (IMODIUM) 2 MG capsule Take 2-4 mg by mouth as needed for diarrhea or loose stools.  ? loratadine (CLARITIN) 10 MG tablet Take 10 mg by mouth in the morning.   ? Lutein 20 MG TABS Take 20 mg by mouth daily.  ? memantine (NAMENDA) 10 MG tablet Take 1 tablet (10 mg total) by mouth 2 (two) times daily.  ? methocarbamol (ROBAXIN) 500 MG tablet Take 500 mg by mouth 3 (three) times daily as needed for pain.  ? montelukast (SINGULAIR) 10 MG tablet Take 10 mg by mouth daily.   ? Multiple  Vitamins-Minerals (MULTIVITAMIN PO) Take 1 tablet by mouth every evening.   ? nitroGLYCERIN (NITROSTAT) 0.4 MG SL tablet DISSOLVE 1 TABLET UNDER THE TONGUE EVERY 5 MINUTES AS NEEDED FOR CHEST PAIN. DO NOT EXCEED A TOTAL OF 3 DO

## 2021-07-01 DIAGNOSIS — I471 Supraventricular tachycardia: Secondary | ICD-10-CM | POA: Diagnosis not present

## 2021-07-01 DIAGNOSIS — R600 Localized edema: Secondary | ICD-10-CM | POA: Diagnosis not present

## 2021-07-01 DIAGNOSIS — D649 Anemia, unspecified: Secondary | ICD-10-CM | POA: Diagnosis not present

## 2021-07-01 DIAGNOSIS — E538 Deficiency of other specified B group vitamins: Secondary | ICD-10-CM | POA: Diagnosis not present

## 2021-07-01 DIAGNOSIS — F0390 Unspecified dementia without behavioral disturbance: Secondary | ICD-10-CM | POA: Diagnosis not present

## 2021-07-01 DIAGNOSIS — N400 Enlarged prostate without lower urinary tract symptoms: Secondary | ICD-10-CM | POA: Diagnosis not present

## 2021-07-01 DIAGNOSIS — E559 Vitamin D deficiency, unspecified: Secondary | ICD-10-CM | POA: Diagnosis not present

## 2021-07-01 DIAGNOSIS — J449 Chronic obstructive pulmonary disease, unspecified: Secondary | ICD-10-CM | POA: Diagnosis not present

## 2021-07-01 DIAGNOSIS — G4700 Insomnia, unspecified: Secondary | ICD-10-CM | POA: Diagnosis not present

## 2021-07-01 DIAGNOSIS — Z8679 Personal history of other diseases of the circulatory system: Secondary | ICD-10-CM | POA: Diagnosis not present

## 2021-07-01 DIAGNOSIS — G2581 Restless legs syndrome: Secondary | ICD-10-CM | POA: Diagnosis not present

## 2021-07-01 DIAGNOSIS — E785 Hyperlipidemia, unspecified: Secondary | ICD-10-CM | POA: Diagnosis not present

## 2021-07-06 DIAGNOSIS — J439 Emphysema, unspecified: Secondary | ICD-10-CM | POA: Diagnosis not present

## 2021-07-12 DIAGNOSIS — F0390 Unspecified dementia without behavioral disturbance: Secondary | ICD-10-CM | POA: Diagnosis not present

## 2021-07-12 DIAGNOSIS — D692 Other nonthrombocytopenic purpura: Secondary | ICD-10-CM | POA: Diagnosis not present

## 2021-07-12 DIAGNOSIS — R5381 Other malaise: Secondary | ICD-10-CM | POA: Diagnosis not present

## 2021-07-12 DIAGNOSIS — Z993 Dependence on wheelchair: Secondary | ICD-10-CM | POA: Diagnosis not present

## 2021-07-12 DIAGNOSIS — Z9981 Dependence on supplemental oxygen: Secondary | ICD-10-CM | POA: Diagnosis not present

## 2021-07-12 DIAGNOSIS — Z515 Encounter for palliative care: Secondary | ICD-10-CM | POA: Diagnosis not present

## 2021-07-12 DIAGNOSIS — J439 Emphysema, unspecified: Secondary | ICD-10-CM | POA: Diagnosis not present

## 2021-07-12 DIAGNOSIS — G4700 Insomnia, unspecified: Secondary | ICD-10-CM | POA: Diagnosis not present

## 2021-07-24 DIAGNOSIS — I10 Essential (primary) hypertension: Secondary | ICD-10-CM | POA: Diagnosis not present

## 2021-08-06 DIAGNOSIS — J439 Emphysema, unspecified: Secondary | ICD-10-CM | POA: Diagnosis not present

## 2021-08-12 ENCOUNTER — Ambulatory Visit: Payer: PPO | Admitting: Cardiology

## 2021-08-12 ENCOUNTER — Encounter: Payer: Self-pay | Admitting: Cardiology

## 2021-08-12 VITALS — BP 108/58 | HR 72 | Ht 65.0 in | Wt 184.0 lb

## 2021-08-12 DIAGNOSIS — Z8679 Personal history of other diseases of the circulatory system: Secondary | ICD-10-CM | POA: Diagnosis not present

## 2021-08-12 DIAGNOSIS — I480 Paroxysmal atrial fibrillation: Secondary | ICD-10-CM

## 2021-08-12 DIAGNOSIS — Z9889 Other specified postprocedural states: Secondary | ICD-10-CM | POA: Diagnosis not present

## 2021-08-12 DIAGNOSIS — I2510 Atherosclerotic heart disease of native coronary artery without angina pectoris: Secondary | ICD-10-CM

## 2021-08-12 DIAGNOSIS — R0989 Other specified symptoms and signs involving the circulatory and respiratory systems: Secondary | ICD-10-CM | POA: Diagnosis not present

## 2021-08-12 DIAGNOSIS — R600 Localized edema: Secondary | ICD-10-CM | POA: Diagnosis not present

## 2021-08-12 DIAGNOSIS — Z6830 Body mass index (BMI) 30.0-30.9, adult: Secondary | ICD-10-CM | POA: Diagnosis not present

## 2021-08-12 NOTE — Progress Notes (Signed)
Cardiology Office Note:    Date:  08/12/2021   ID:  Chris Braun, DOB 1935-08-09, MRN [id number]  PCP:  Jim Like, NP  Cardiologist:  Garwin Brothers, MD   Referring MD: Jim Like, NP    ASSESSMENT:    1. PAF (paroxysmal atrial fibrillation) (HCC)   2. Coronary artery disease involving native coronary artery of native heart without angina pectoris   3. Status post ablation of ventricular arrhythmia    PLAN:    In order of problems listed above:  Coronary artery disease: Stressed with the patient.  Importance of compliance with diet and medication stressed and patient vocalized understanding. Paroxysmal atrial fibrillation:I discussed with the patient atrial fibrillation, disease process. Management and therapy including rate and rhythm control, anticoagulation benefits and potential risks were discussed extensively with the patient. Patient had multiple questions which were answered to patient's satisfaction. Bilateral pedal edema: Patient's Lasix was discontinued for a couple of days.  Wife is going to restart this today and give [place] a follow-up.  I think that is the reason why he developed pedal edema.  There was some concern about pulmonary edema.  But is not in any distress and his lungs are clear. Mixed dyslipidemia: On lipid-lowering therapy.  Diet emphasized.  This is followed by primary care. Patient will keep previous appointment.  Blood work was done by primary care today and will get a copy of that report.   Medication Adjustments/Labs and Tests Ordered: Current medicines are reviewed at length with the patient today.  Concerns regarding medicines are outlined above.  No orders of the defined types were placed in this encounter.  No orders of the defined types were placed in this encounter.    No chief complaint on file.    History of Present Illness:    Chris Braun is a 86 y.o. male.  Patient has past medical history of paroxysmal atrial fibrillation,  coronary artery disease and dyslipidemia.  He has issues with memory.  He is brought in by his wife in a wheelchair.  He has pedal edema.  He fell down a few days ago had a mechanical fall.  Patient subsequently was doing fine.  He is never fallen since.  He is on anticoagulation.  His wife held his diuretics and he has pedal edema for which reason the wife wanted me to see him.  Past Medical History:  Diagnosis Date   Acute hypoxemic respiratory failure (HCC) 05/27/2018   Acute ischemic stroke (HCC) 08/17/2020   AF (paroxysmal atrial fibrillation) (HCC) 08/17/2020   AKI (acute kidney injury) (HCC) 05/27/2018   Aspiration into airway    Asthma    Atrial tachycardia (HCC) 07/26/2018   B12 deficiency    Bilateral optic neuropathy    CAD (coronary artery disease) 08/24/2014   Carotid stenosis, bilateral 08/24/2014   Cataracts, bilateral    Compression fracture of L2 lumbar vertebra, closed, initial encounter (HCC) 10/27/2019   COPD (chronic obstructive pulmonary disease) (HCC)    Depression    Diverticulosis    per colonscopy   Dyslipidemia 08/24/2014   Falls frequently 06/03/2015   Food impaction of esophagus    Gait abnormality 01/19/2018   GERD (gastroesophageal reflux disease)    Hiatal hernia    High cholesterol    Hypertension    Hypoxia    Idiopathic progressive polyneuropathy    Kidney stones    Lumbar vertebral fracture, pathologic 10/27/2019   Macular degeneration    Memory disorder  01/19/2018   Myocardial infarction [facility]) 2002   Oculomotor nerve palsy, 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   Osteoporosis    Renal insufficiency 05/27/2018   Right kidney stone 05/27/2018   Right upper lobe pneumonia 05/27/2018   Risk for falls 05/28/2015   RLS (restless legs syndrome) 04/05/2020   Status post ablation of ventricular arrhythmia 04/27/2018   Stroke (cerebrum) (HCC) 01/17/2014   Stroke (HCC)    SVT (supraventricular tachycardia) (HCC) 12/09/2019   Weakness of distal arms and legs 06/03/2015    Past Surgical  History:  Procedure Laterality Date   CARDIAC CATHETERIZATION  2002,   stents    CATARACT EXTRACTION Left 2016   Colonscopy  2012   CYSTOSCOPY W/ URETERAL STENT PLACEMENT Right 05/30/2018   Procedure: CYSTOSCOPY WITH RETROGRADE PYELOGRAM/URETERAL STENT PLACEMENT;  Surgeon: Bjorn Pippin, MD;  [facility];  Service: Urology;  Laterality: Right;   ESOPHAGOGASTRODUODENOSCOPY (EGD) WITH PROPOFOL N/A 02/10/2020   Procedure: ESOPHAGOGASTRODUODENOSCOPY (EGD) WITH PROPOFOL;  Surgeon: Benancio Deeds, MD;  Location: [facility] ENDOSCOPY;  Service: Gastroenterology;  Laterality: N/A;   FOREIGN BODY REMOVAL  02/10/2020   Procedure: FOREIGN BODY REMOVAL;  Surgeon: Benancio Deeds, MD;  Location: [facility] ENDOSCOPY;  Service: Gastroenterology;;   HIP FRACTURE SURGERY  2008   right   HYDROCELE EXCISION  10/12/2014   LITHOTRIPSY     2007, 2009, 2010, 2011, 2012, 2013   LUMBAR LAMINECTOMY/DECOMPRESSION MICRODISCECTOMY  03/15/2012   Procedure: LUMBAR LAMINECTOMY/DECOMPRESSION MICRODISCECTOMY 1 LEVEL;  Surgeon: Mariam Dollar, MD;  Location: [facility] NEURO ORS;  Service: Neurosurgery;  Laterality: Left;  Left lumbar three-four decompressive lumbar laminectomy, discectomy   MAXIMUM ACCESS (MAS)POSTERIOR LUMBAR INTERBODY FUSION (PLIF) 2 LEVEL N/A 12/08/2012   Procedure: FOR MAXIMUM ACCESS (MAS) POSTERIOR LUMBAR INTERBODY FUSION (PLIF) 2 LEVEL;  Surgeon: Mariam Dollar, MD;  Location: [facility] NEURO ORS;  Service: Neurosurgery;  Laterality: N/A;  FOR MAXIMUM ACCESS (MAS) POSTERIOR LUMBAR INTERBODY FUSION (PLIF) 2 LEVEL   ROTATOR CUFF REPAIR  1999   bil    SALIVARY STONE REMOVAL  1964   1964   TOTAL HIP ARTHROPLASTY Right 2008   VT study     with Ablation    Current Medications: Current Meds  Medication Sig   acetaminophen (TYLENOL) 650 MG CR tablet Take 1,300 mg by mouth every 8 (eight) hours as needed for pain.   amiodarone (PACERONE) 200 MG tablet Take 0.5 tablets (100 mg total) by mouth daily.   aspirin EC 81 MG tablet  Take 1 tablet (81 mg total) by mouth at bedtime. Holding for spinal tap on 08/21/2020.  May resume after 8 hours after the spinal tap.   atorvastatin (LIPITOR) 80 MG tablet Take 80 mg by mouth at bedtime.   benzonatate (TESSALON) 200 MG capsule Take 200 mg by mouth 3 (three) times daily as needed for cough.   calcitonin, salmon, (MIACALCIN/FORTICAL) 200 UNIT/ACT nasal spray Place 1 spray into alternate nostrils at bedtime.   Calcium Carb-Cholecalciferol (CALCIUM-VITAMIN D3) 600-200 MG-UNIT TABS Take 600 mg by mouth 2 (two) times daily.   digoxin (LANOXIN) 0.125 MG tablet Take 1 tablet (125 mcg total) by mouth every other day.   docusate sodium (COLACE) 100 MG capsule Take 100 mg by mouth at bedtime.   doxycycline (VIBRA-TABS) 100 MG tablet Take 100 mg by mouth 2 (two) times daily.   ELIQUIS 5 MG TABS tablet Take 5 mg by mouth 2 (two) times daily.   ESBRIET 267 MG CAPS Take 3 capsules by mouth 3 (three) times  daily.   Ferrous Gluconate (IRON 27 PO) Take 27 mg by mouth every evening.   furosemide (LASIX) 20 MG tablet Take 20 mg by mouth daily.   Guaifenesin (MUCINEX MAXIMUM STRENGTH) 1200 MG TB12 Take 1,200 mg by mouth in the morning and at bedtime.   ipratropium-albuterol (DUONEB) 0.5-2.5 (3) MG/3ML SOLN Take 3 mLs by nebulization 2 (two) times daily.    ketoconazole (NIZORAL) 2 % cream Apply 1 application topically daily as needed for irritation (on feet). Apply as needed to affected area.     lactulose (CHRONULAC) 10 GM/15ML solution Take 10 g by mouth at bedtime as needed for mild constipation.    loperamide (IMODIUM) 2 MG capsule Take 2-4 mg by mouth as needed for diarrhea or loose stools.   loratadine (CLARITIN) 10 MG tablet Take 10 mg by mouth in the morning.    Lutein 20 MG TABS Take 20 mg by mouth daily.   memantine (NAMENDA) 10 MG tablet Take 1 tablet (10 mg total) by mouth 2 (two) times daily.   methocarbamol (ROBAXIN) 500 MG tablet Take 500 mg by mouth 3 (three) times daily as needed for  pain.   montelukast (SINGULAIR) 10 MG tablet Take 10 mg by mouth daily.    Multiple Vitamins-Minerals (MULTIVITAMIN PO) Take 1 tablet by mouth every evening.    nitroGLYCERIN (NITROSTAT) 0.4 MG SL tablet DISSOLVE 1 TABLET UNDER THE TONGUE EVERY 5 MINUTES AS NEEDED FOR CHEST PAIN. DO NOT EXCEED A TOTAL OF 3 DOSES IN 15 MINUTES.   ondansetron (ZOFRAN-ODT) 4 MG disintegrating tablet Take 4 mg by mouth every 8 (eight) hours as needed for nausea or vomiting.    pantoprazole (PROTONIX) 40 MG tablet Take 1 tablet (40 mg total) by mouth 2 (two) times daily.   potassium chloride (KLOR-CON) 10 MEQ tablet Take 10 mEq by mouth daily as needed for other (when taking furosemide). When taking Lasix   SYMBICORT 160-4.5 MCG/ACT inhaler Inhale 2 puffs into the lungs 2 (two) times daily.   vitamin B-12 (CYANOCOBALAMIN) 1000 MCG tablet Take 1,000 mcg by mouth daily.     Allergies:   Other, Pneumococcal polysaccharide vaccine, Hydrocodone-acetaminophen, Ibuprofen, and Oxycodone   Social History   Socioeconomic History   Marital status: Married    Spouse name: Kristina Schriver   Number of children: 4   Years of education: 12   Highest education level: Not on file  Occupational History   Occupation: Retired  Tobacco Use   Smoking status: Former    Packs/day: 1.00    Years: 45.00    Total pack years: 45.00    Types: Cigarettes    Quit date: 02/02/2001    Years since quitting: 20.5   Smokeless tobacco: Current    Types: Chew  Vaping Use   Vaping Use: Never used  Substance and Sexual Activity   Alcohol use: No   Drug use: No   Sexual activity: Not on file  Other Topics Concern   Not on file  Social History Narrative   Lives with wife   Caffeine use: coffee, tea   Social Determinants of Health   Financial Resource Strain: Not on file  Food Insecurity: Not on file  Transportation Needs: Not on file  Physical Activity: Not on file  Stress: Not on file  Social Connections: Not on file      Family History: The patient's family history includes Alzheimer's disease in his sister; Heart disease in his father, mother, sister, sister, and sister; Hemolytic uremic syndrome  in his mother; Stroke in his father.  ROS:   Please see the history of present illness.    All other systems reviewed and are negative.  EKGs/Labs/Other Studies Reviewed:    The following studies were reviewed today: I discussed my findings with the patient at length.   Recent Labs: 08/16/2020: ALT 19 08/17/2020: BUN 10; Creatinine, Ser 1.24; Hemoglobin 15.2; Platelets 181; Potassium 4.3; Sodium 132; TSH 4.192  Recent Lipid Panel    Component Value Date/Time   CHOL 177 08/17/2020 0500   TRIG 145 08/17/2020 0500   HDL 44 08/17/2020 0500   CHOLHDL 4.0 08/17/2020 0500   VLDL 29 08/17/2020 0500   LDLCALC 104 (H) 08/17/2020 0500    Physical Exam:    VS:  BP (!) 108/58   Pulse 72   Ht 5\' 5"  (1.651 m)   Wt 184 lb (83.5 kg)   SpO2 91%   BMI 30.62 kg/m     Wt Readings from Last 3 Encounters:  08/12/21 184 lb (83.5 kg)  06/20/21 178 lb (80.7 kg)  12/17/20 174 lb 9.6 oz (79.2 kg)     GEN: Patient is in no acute distress HEENT: Normal NECK: No JVD; No carotid bruits LYMPHATICS: No lymphadenopathy CARDIAC: Hear sounds regular, 2/6 systolic murmur at the apex. RESPIRATORY:  Clear to auscultation without rales, wheezing or rhonchi  ABDOMEN: Soft, non-tender, non-distended MUSCULOSKELETAL:  No edema; No deformity  SKIN: Warm and dry NEUROLOGIC:  Alert and oriented x 3 PSYCHIATRIC:  Normal affect   Signed, Garwin Brothers, MD  08/12/2021 1:48 PM     [facility]

## 2021-08-26 DIAGNOSIS — R918 Other nonspecific abnormal finding of lung field: Secondary | ICD-10-CM | POA: Diagnosis not present

## 2021-08-26 DIAGNOSIS — J301 Allergic rhinitis due to pollen: Secondary | ICD-10-CM | POA: Diagnosis not present

## 2021-08-26 DIAGNOSIS — J4530 Mild persistent asthma, uncomplicated: Secondary | ICD-10-CM | POA: Diagnosis not present

## 2021-08-26 DIAGNOSIS — J439 Emphysema, unspecified: Secondary | ICD-10-CM | POA: Diagnosis not present

## 2021-08-27 DIAGNOSIS — G8929 Other chronic pain: Secondary | ICD-10-CM | POA: Diagnosis not present

## 2021-08-27 DIAGNOSIS — Z6829 Body mass index (BMI) 29.0-29.9, adult: Secondary | ICD-10-CM | POA: Diagnosis not present

## 2021-08-27 DIAGNOSIS — M545 Low back pain, unspecified: Secondary | ICD-10-CM | POA: Diagnosis not present

## 2021-08-28 DIAGNOSIS — M545 Low back pain, unspecified: Secondary | ICD-10-CM | POA: Diagnosis not present

## 2021-08-28 DIAGNOSIS — M8588 Other specified disorders of bone density and structure, other site: Secondary | ICD-10-CM | POA: Diagnosis not present

## 2021-08-28 DIAGNOSIS — I10 Essential (primary) hypertension: Secondary | ICD-10-CM | POA: Diagnosis not present

## 2021-08-28 DIAGNOSIS — Z885 Allergy status to narcotic agent status: Secondary | ICD-10-CM | POA: Diagnosis not present

## 2021-08-28 DIAGNOSIS — I499 Cardiac arrhythmia, unspecified: Secondary | ICD-10-CM | POA: Diagnosis not present

## 2021-08-28 DIAGNOSIS — S22070A Wedge compression fracture of T9-T10 vertebra, initial encounter for closed fracture: Secondary | ICD-10-CM | POA: Diagnosis not present

## 2021-08-28 DIAGNOSIS — N200 Calculus of kidney: Secondary | ICD-10-CM | POA: Diagnosis not present

## 2021-08-28 DIAGNOSIS — J189 Pneumonia, unspecified organism: Secondary | ICD-10-CM | POA: Diagnosis not present

## 2021-08-28 DIAGNOSIS — I2510 Atherosclerotic heart disease of native coronary artery without angina pectoris: Secondary | ICD-10-CM | POA: Diagnosis not present

## 2021-08-28 DIAGNOSIS — K449 Diaphragmatic hernia without obstruction or gangrene: Secondary | ICD-10-CM | POA: Diagnosis not present

## 2021-08-28 DIAGNOSIS — F419 Anxiety disorder, unspecified: Secondary | ICD-10-CM | POA: Diagnosis not present

## 2021-08-28 DIAGNOSIS — R079 Chest pain, unspecified: Secondary | ICD-10-CM | POA: Diagnosis not present

## 2021-08-28 DIAGNOSIS — Z7982 Long term (current) use of aspirin: Secondary | ICD-10-CM | POA: Diagnosis not present

## 2021-08-28 DIAGNOSIS — F32A Depression, unspecified: Secondary | ICD-10-CM | POA: Diagnosis not present

## 2021-08-28 DIAGNOSIS — Z7901 Long term (current) use of anticoagulants: Secondary | ICD-10-CM | POA: Diagnosis not present

## 2021-08-28 DIAGNOSIS — Z888 Allergy status to other drugs, medicaments and biological substances status: Secondary | ICD-10-CM | POA: Diagnosis not present

## 2021-08-28 DIAGNOSIS — R339 Retention of urine, unspecified: Secondary | ICD-10-CM | POA: Diagnosis not present

## 2021-08-28 DIAGNOSIS — Z792 Long term (current) use of antibiotics: Secondary | ICD-10-CM | POA: Diagnosis not present

## 2021-08-28 DIAGNOSIS — Z8673 Personal history of transient ischemic attack (TIA), and cerebral infarction without residual deficits: Secondary | ICD-10-CM | POA: Diagnosis not present

## 2021-08-28 DIAGNOSIS — I714 Abdominal aortic aneurysm, without rupture, unspecified: Secondary | ICD-10-CM | POA: Diagnosis not present

## 2021-08-28 DIAGNOSIS — M810 Age-related osteoporosis without current pathological fracture: Secondary | ICD-10-CM | POA: Diagnosis not present

## 2021-08-28 DIAGNOSIS — R0902 Hypoxemia: Secondary | ICD-10-CM | POA: Diagnosis not present

## 2021-08-28 DIAGNOSIS — E7800 Pure hypercholesterolemia, unspecified: Secondary | ICD-10-CM | POA: Diagnosis not present

## 2021-08-28 DIAGNOSIS — J439 Emphysema, unspecified: Secondary | ICD-10-CM | POA: Diagnosis not present

## 2021-08-28 DIAGNOSIS — Z955 Presence of coronary angioplasty implant and graft: Secondary | ICD-10-CM | POA: Diagnosis not present

## 2021-08-28 DIAGNOSIS — F0390 Unspecified dementia without behavioral disturbance: Secondary | ICD-10-CM | POA: Diagnosis not present

## 2021-08-28 DIAGNOSIS — S32020A Wedge compression fracture of second lumbar vertebra, initial encounter for closed fracture: Secondary | ICD-10-CM | POA: Diagnosis not present

## 2021-08-28 DIAGNOSIS — I482 Chronic atrial fibrillation, unspecified: Secondary | ICD-10-CM | POA: Diagnosis not present

## 2021-08-28 DIAGNOSIS — Z8701 Personal history of pneumonia (recurrent): Secondary | ICD-10-CM | POA: Diagnosis not present

## 2021-08-28 DIAGNOSIS — R1110 Vomiting, unspecified: Secondary | ICD-10-CM | POA: Diagnosis not present

## 2021-08-28 DIAGNOSIS — I252 Old myocardial infarction: Secondary | ICD-10-CM | POA: Diagnosis not present

## 2021-08-28 DIAGNOSIS — K219 Gastro-esophageal reflux disease without esophagitis: Secondary | ICD-10-CM | POA: Diagnosis not present

## 2021-08-28 DIAGNOSIS — J440 Chronic obstructive pulmonary disease with acute lower respiratory infection: Secondary | ICD-10-CM | POA: Diagnosis not present

## 2021-08-28 DIAGNOSIS — M1990 Unspecified osteoarthritis, unspecified site: Secondary | ICD-10-CM | POA: Diagnosis not present

## 2021-08-28 DIAGNOSIS — Z87891 Personal history of nicotine dependence: Secondary | ICD-10-CM | POA: Diagnosis not present

## 2021-09-06 DIAGNOSIS — E7800 Pure hypercholesterolemia, unspecified: Secondary | ICD-10-CM | POA: Diagnosis not present

## 2021-09-06 DIAGNOSIS — S22078D Other fracture of T9-T10 vertebra, subsequent encounter for fracture with routine healing: Secondary | ICD-10-CM | POA: Diagnosis not present

## 2021-09-06 DIAGNOSIS — R339 Retention of urine, unspecified: Secondary | ICD-10-CM | POA: Diagnosis not present

## 2021-09-06 DIAGNOSIS — J9611 Chronic respiratory failure with hypoxia: Secondary | ICD-10-CM | POA: Diagnosis not present

## 2021-09-06 DIAGNOSIS — I252 Old myocardial infarction: Secondary | ICD-10-CM | POA: Diagnosis not present

## 2021-09-06 DIAGNOSIS — Z792 Long term (current) use of antibiotics: Secondary | ICD-10-CM | POA: Diagnosis not present

## 2021-09-06 DIAGNOSIS — Z7982 Long term (current) use of aspirin: Secondary | ICD-10-CM | POA: Diagnosis not present

## 2021-09-06 DIAGNOSIS — Z466 Encounter for fitting and adjustment of urinary device: Secondary | ICD-10-CM | POA: Diagnosis not present

## 2021-09-06 DIAGNOSIS — J189 Pneumonia, unspecified organism: Secondary | ICD-10-CM | POA: Diagnosis not present

## 2021-09-06 DIAGNOSIS — F0393 Unspecified dementia, unspecified severity, with mood disturbance: Secondary | ICD-10-CM | POA: Diagnosis not present

## 2021-09-06 DIAGNOSIS — I10 Essential (primary) hypertension: Secondary | ICD-10-CM | POA: Diagnosis not present

## 2021-09-06 DIAGNOSIS — F0394 Unspecified dementia, unspecified severity, with anxiety: Secondary | ICD-10-CM | POA: Diagnosis not present

## 2021-09-06 DIAGNOSIS — F32A Depression, unspecified: Secondary | ICD-10-CM | POA: Diagnosis not present

## 2021-09-06 DIAGNOSIS — J439 Emphysema, unspecified: Secondary | ICD-10-CM | POA: Diagnosis not present

## 2021-09-06 DIAGNOSIS — S32020D Wedge compression fracture of second lumbar vertebra, subsequent encounter for fracture with routine healing: Secondary | ICD-10-CM | POA: Diagnosis not present

## 2021-09-06 DIAGNOSIS — Z9981 Dependence on supplemental oxygen: Secondary | ICD-10-CM | POA: Diagnosis not present

## 2021-09-06 DIAGNOSIS — J8410 Pulmonary fibrosis, unspecified: Secondary | ICD-10-CM | POA: Diagnosis not present

## 2021-09-06 DIAGNOSIS — I2510 Atherosclerotic heart disease of native coronary artery without angina pectoris: Secondary | ICD-10-CM | POA: Diagnosis not present

## 2021-09-06 DIAGNOSIS — I482 Chronic atrial fibrillation, unspecified: Secondary | ICD-10-CM | POA: Diagnosis not present

## 2021-09-06 DIAGNOSIS — Z7951 Long term (current) use of inhaled steroids: Secondary | ICD-10-CM | POA: Diagnosis not present

## 2021-09-06 DIAGNOSIS — M1990 Unspecified osteoarthritis, unspecified site: Secondary | ICD-10-CM | POA: Diagnosis not present

## 2021-09-06 DIAGNOSIS — N200 Calculus of kidney: Secondary | ICD-10-CM | POA: Diagnosis not present

## 2021-09-06 DIAGNOSIS — K219 Gastro-esophageal reflux disease without esophagitis: Secondary | ICD-10-CM | POA: Diagnosis not present

## 2021-09-06 DIAGNOSIS — J45909 Unspecified asthma, uncomplicated: Secondary | ICD-10-CM | POA: Diagnosis not present

## 2021-09-16 ENCOUNTER — Other Ambulatory Visit: Payer: Self-pay | Admitting: Cardiology

## 2021-09-19 DIAGNOSIS — R5381 Other malaise: Secondary | ICD-10-CM | POA: Diagnosis not present

## 2021-09-19 DIAGNOSIS — Z515 Encounter for palliative care: Secondary | ICD-10-CM | POA: Diagnosis not present

## 2021-09-19 DIAGNOSIS — Z66 Do not resuscitate: Secondary | ICD-10-CM | POA: Diagnosis not present

## 2021-09-19 DIAGNOSIS — M8448XD Pathological fracture, other site, subsequent encounter for fracture with routine healing: Secondary | ICD-10-CM | POA: Diagnosis not present

## 2021-10-18 DEATH — deceased

## 2022-04-15 ENCOUNTER — Ambulatory Visit: Payer: PPO | Admitting: Neurology

## 2023-02-20 IMAGING — MR MR ORBITS WO/W CM
8 of 10 series · 33 of 48 positions shown · IV contrast (gadavist)
Comparison: 09/28/2017

CLINICAL DATA: Optic nerve edema

EXAM:
MRI HEAD AND ORBITS WITHOUT AND WITH CONTRAST
TECHNIQUE: Multiplanar, multiecho pulse sequences of the brain and surrounding
structures were obtained without and with intravenous contrast.
Multiplanar, multiecho pulse sequences of the orbits and surrounding
structures were obtained including fat saturation techniques, before
and after intravenous contrast administration.
CONTRAST:  7.5mL GADAVIST GADOBUTROL 1 MMOL/ML IV SOLN

[Series 5: T1 · axial · non-contrast · 3.0mm · 0.37mm/px · z∈[-106,-40]mm · 3 of 17 slices shown (1 of 2)]
[im 1/17]
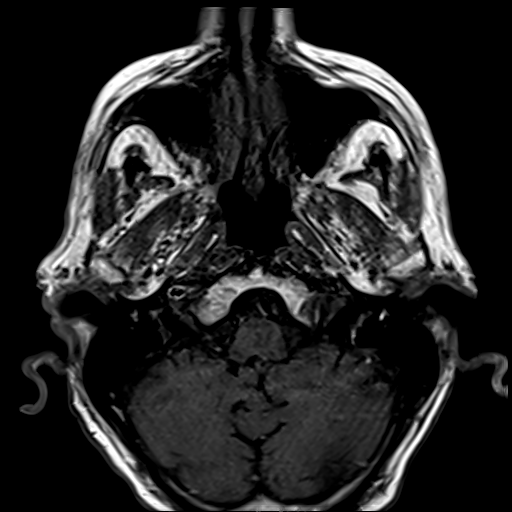
[im 9/17]
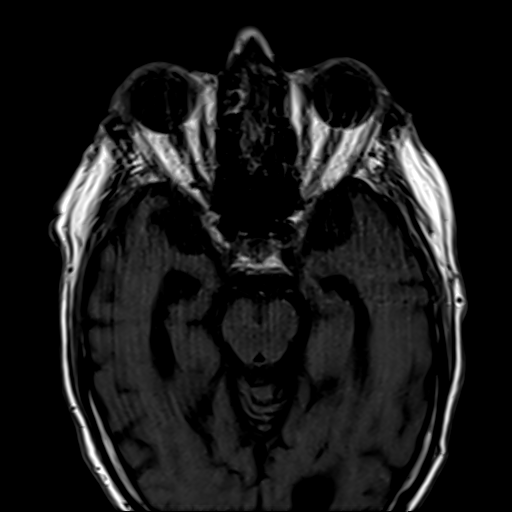
[im 17/17]
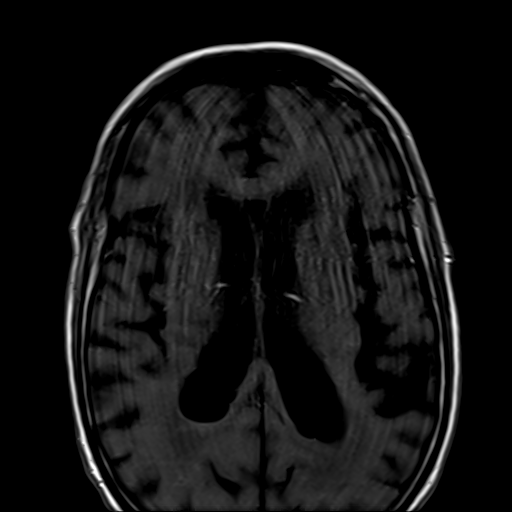

[Series 9: T2 fat-sat · coronal · 3.0mm · 0.54mm/px · 5 of 26 slices shown (1 of 6)]
[im 1/26]
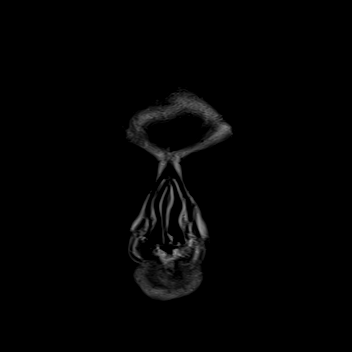
[im 7/26]
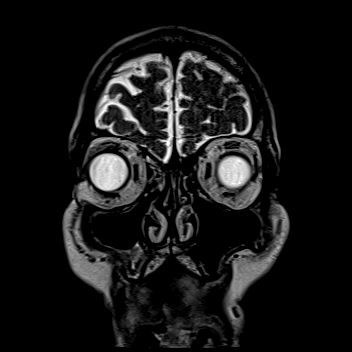
[im 13/26]
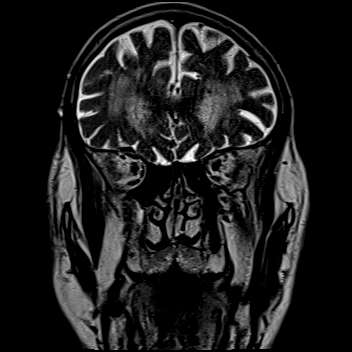
[im 19/26]
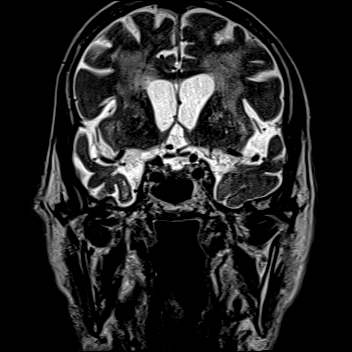
[im 26/26]
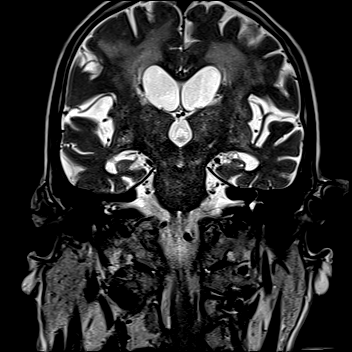

[Series 10: T2 fat-sat · coronal · 3.0mm · 0.54mm/px · 6 of 26 slices shown (2 of 6)]
[im 1/26]
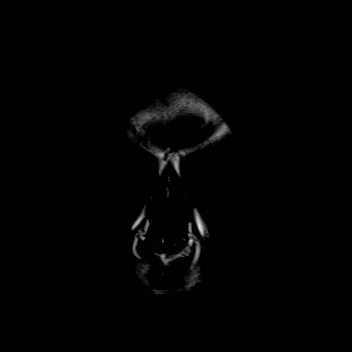
[im 6/26]
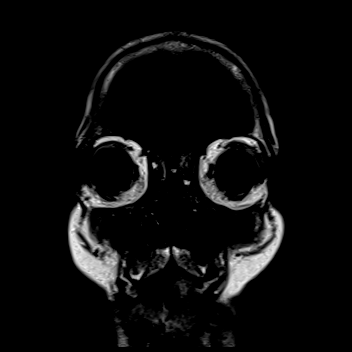
[im 11/26]
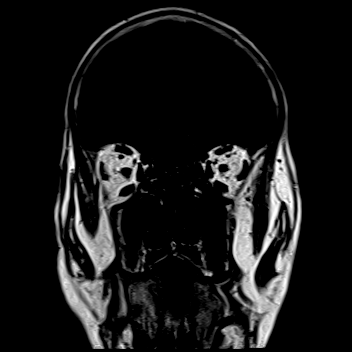
[im 16/26]
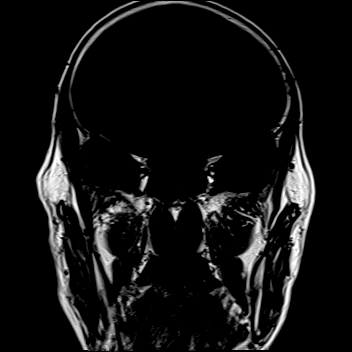
[im 21/26]
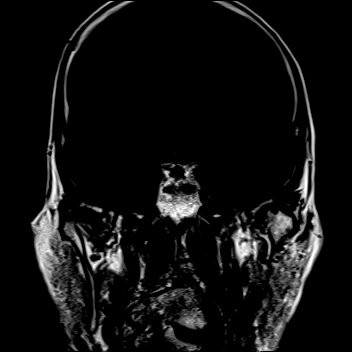
[im 26/26]
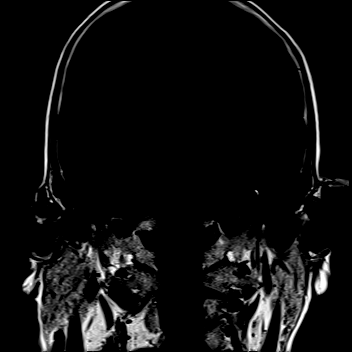

[Series 11: T2 fat-sat · coronal · 3.0mm · 0.54mm/px · 6 of 26 slices shown (3 of 6)]
[im 1/26]
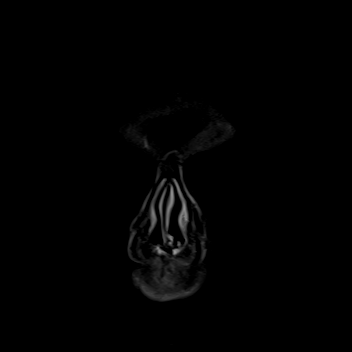
[im 6/26]
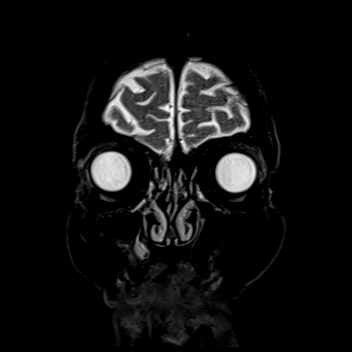
[im 11/26]
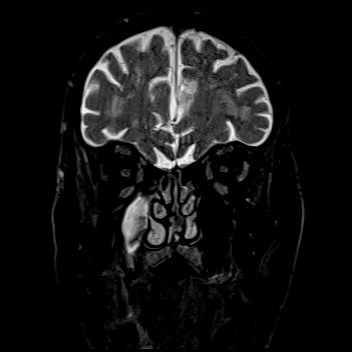
[im 16/26]
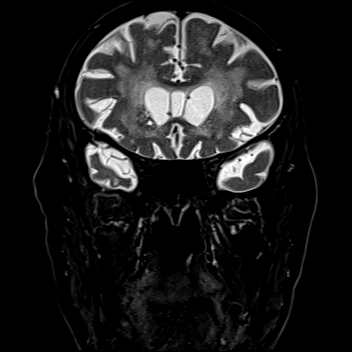
[im 21/26]
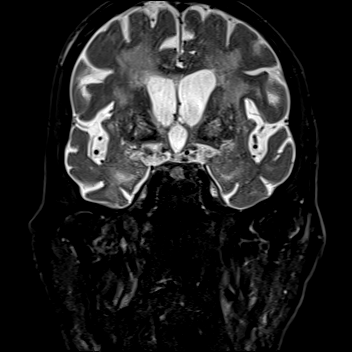
[im 26/26]
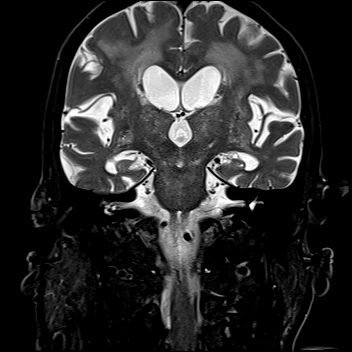

[Series 12: T2 fat-sat · axial · 3.0mm · 0.54mm/px · z∈[-110,-36]mm · 4 of 19 slices shown (4 of 6)]
[im 1/19]
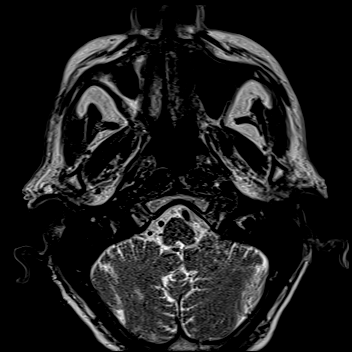
[im 7/19]
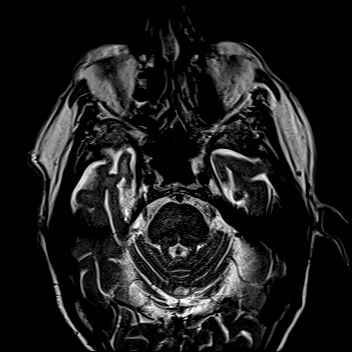
[im 13/19]
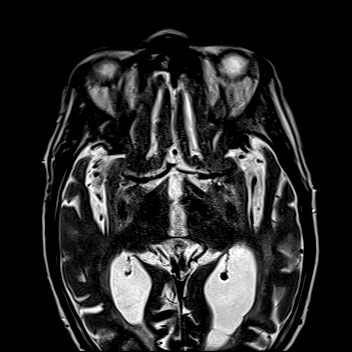
[im 19/19]
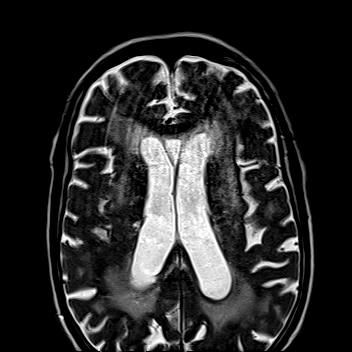

[Series 13: T2 fat-sat · axial · 3.0mm · 0.54mm/px · z∈[-110,-36]mm · 4 of 19 slices shown (5 of 6)]
[im 1/19]
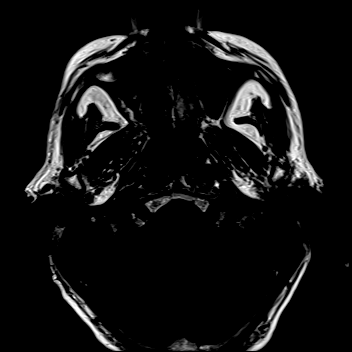
[im 7/19]
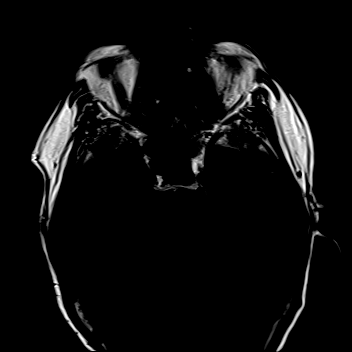
[im 13/19]
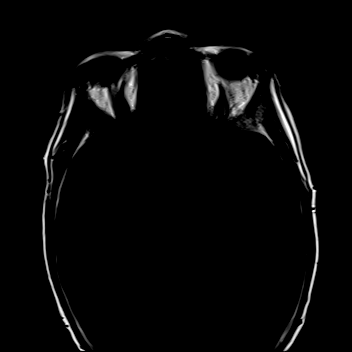
[im 19/19]
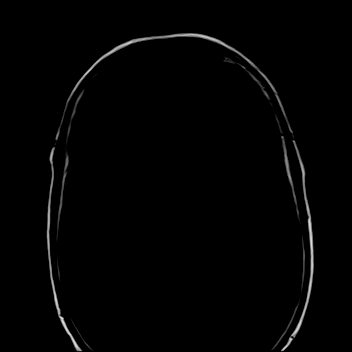

[Series 14: T2 fat-sat · axial · 3.0mm · 0.54mm/px · z∈[-110,-36]mm · 4 of 19 slices shown (6 of 6)]
[im 1/19]
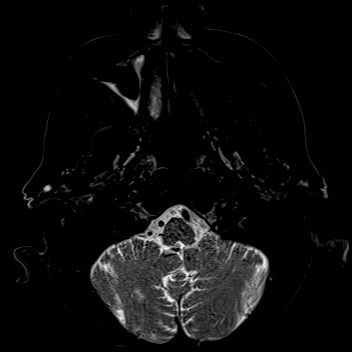
[im 7/19]
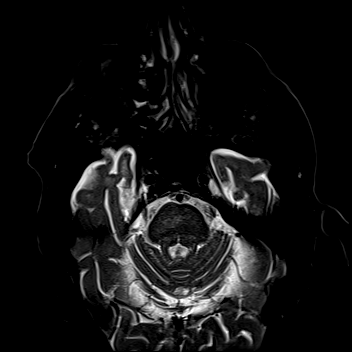
[im 13/19]
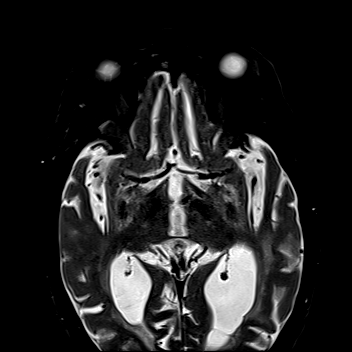
[im 19/19]
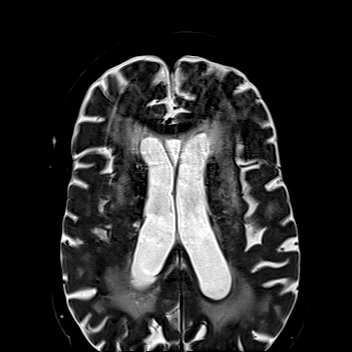

[Series 15: T1 · coronal · 3.0mm · 0.37mm/px · 1 of 26 slices shown (2 of 2)]
[im 1/26]
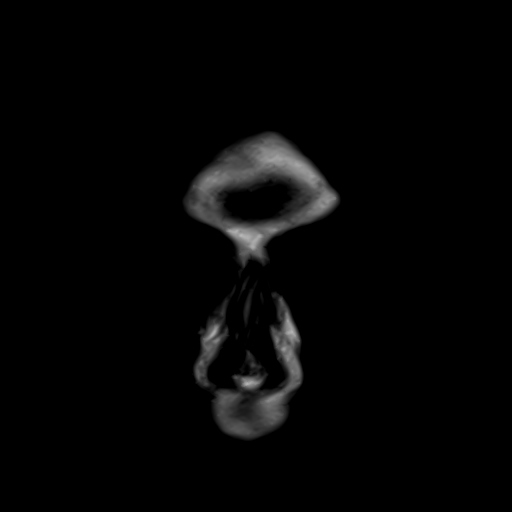

[33 of 48 positions shown; findings below may reference images not displayed]

FINDINGS: MRI HEAD FINDINGS

Brain: Small focus of abnormal diffusion restriction in the left
cerebellum. Chronic microhemorrhage in left cerebellum. Hemosiderin
deposition within the right cerebellum Confluent hyperintense
T2-weighted white matter signal. Diffuse, severe atrophy. Multiple
cerebellar infarcts. There is no abnormal contrast enhancement.

Vascular: Major flow voids are preserved.

Skull and upper cervical spine: Normal calvarium and skull base.
Visualized upper cervical spine and soft tissues are normal.

Sinuses/Orbits:No paranasal sinus fluid levels or advanced mucosal
thickening. No mastoid or middle ear effusion. Normal orbits.

MRI ORBITS FINDINGS

Orbits:

--Globes: Normal.

--Bony orbit: Normal.

--Preseptal soft tissues: Normal.

--Intra- and extraconal orbital fat: Normal. No inflammatory
stranding.

--Optic nerves: Normal.

--Lacrimal glands and fossae: Normal.

--Extraocular muscles: Normal.

Visualized sinuses:  No fluid levels or advanced mucosal thickening.

Soft tissues: Normal.

Limited intracranial: Normal.
IMPRESSION: 1. Small acute infarct of the left cerebellum. No hemorrhage or mass
effect.
2. Normal MRI of the orbits.
